# Patient Record
Sex: Female | Born: 1945 | Race: Black or African American | Hispanic: No | Smoking: Former smoker
Health system: Southern US, Community
[De-identification: ages and names within clinical notes are randomized; demographics above are authoritative.]

## PROBLEM LIST (undated history)

## (undated) DIAGNOSIS — I639 Cerebral infarction, unspecified: Secondary | ICD-10-CM

## (undated) DIAGNOSIS — R011 Cardiac murmur, unspecified: Secondary | ICD-10-CM

## (undated) DIAGNOSIS — I050 Rheumatic mitral stenosis: Secondary | ICD-10-CM

## (undated) DIAGNOSIS — R32 Unspecified urinary incontinence: Secondary | ICD-10-CM

## (undated) DIAGNOSIS — I10 Essential (primary) hypertension: Secondary | ICD-10-CM

## (undated) DIAGNOSIS — D649 Anemia, unspecified: Secondary | ICD-10-CM

## (undated) DIAGNOSIS — R55 Syncope and collapse: Secondary | ICD-10-CM

## (undated) DIAGNOSIS — I350 Nonrheumatic aortic (valve) stenosis: Secondary | ICD-10-CM

## (undated) DIAGNOSIS — M1990 Unspecified osteoarthritis, unspecified site: Secondary | ICD-10-CM

## (undated) HISTORY — PX: TOTAL KNEE ARTHROPLASTY: SHX125

## (undated) HISTORY — PX: TUBAL LIGATION: SHX77

## (undated) HISTORY — DX: Syncope and collapse: R55

## (undated) HISTORY — PX: CATARACT EXTRACTION, BILATERAL: SHX1313

## (undated) HISTORY — PX: CHOLECYSTECTOMY: SHX55

## (undated) HISTORY — PX: TONSILLECTOMY: SUR1361

---

## 1998-06-08 ENCOUNTER — Ambulatory Visit (HOSPITAL_COMMUNITY): Admission: RE | Admit: 1998-06-08 | Discharge: 1998-06-08 | Payer: Self-pay | Admitting: Family Medicine

## 1998-06-16 ENCOUNTER — Ambulatory Visit (HOSPITAL_COMMUNITY): Admission: RE | Admit: 1998-06-16 | Discharge: 1998-06-16 | Payer: Self-pay | Admitting: Family Medicine

## 1998-11-24 ENCOUNTER — Ambulatory Visit (HOSPITAL_COMMUNITY): Admission: RE | Admit: 1998-11-24 | Discharge: 1998-11-24 | Payer: Self-pay | Admitting: *Deleted

## 1998-11-24 ENCOUNTER — Encounter: Payer: Self-pay | Admitting: Family Medicine

## 1998-12-27 ENCOUNTER — Other Ambulatory Visit: Admission: RE | Admit: 1998-12-27 | Discharge: 1998-12-27 | Payer: Self-pay | Admitting: Obstetrics and Gynecology

## 1998-12-31 ENCOUNTER — Other Ambulatory Visit: Admission: RE | Admit: 1998-12-31 | Discharge: 1998-12-31 | Payer: Self-pay | Admitting: Obstetrics and Gynecology

## 1999-08-09 ENCOUNTER — Encounter: Payer: Self-pay | Admitting: Obstetrics and Gynecology

## 1999-08-09 ENCOUNTER — Ambulatory Visit (HOSPITAL_COMMUNITY): Admission: RE | Admit: 1999-08-09 | Discharge: 1999-08-09 | Payer: Self-pay | Admitting: Obstetrics and Gynecology

## 2000-01-09 ENCOUNTER — Other Ambulatory Visit: Admission: RE | Admit: 2000-01-09 | Discharge: 2000-01-09 | Payer: Self-pay | Admitting: Obstetrics and Gynecology

## 2000-08-27 ENCOUNTER — Encounter: Payer: Self-pay | Admitting: Otolaryngology

## 2000-08-27 ENCOUNTER — Ambulatory Visit (HOSPITAL_COMMUNITY): Admission: RE | Admit: 2000-08-27 | Discharge: 2000-08-27 | Payer: Self-pay | Admitting: Otolaryngology

## 2000-09-25 ENCOUNTER — Ambulatory Visit (HOSPITAL_COMMUNITY): Admission: RE | Admit: 2000-09-25 | Discharge: 2000-09-25 | Payer: Self-pay | Admitting: Family Medicine

## 2000-09-25 ENCOUNTER — Encounter: Payer: Self-pay | Admitting: Family Medicine

## 2000-09-28 ENCOUNTER — Encounter: Payer: Self-pay | Admitting: Obstetrics and Gynecology

## 2000-09-28 ENCOUNTER — Encounter: Admission: RE | Admit: 2000-09-28 | Discharge: 2000-09-28 | Payer: Self-pay | Admitting: Family Medicine

## 2001-01-14 ENCOUNTER — Other Ambulatory Visit: Admission: RE | Admit: 2001-01-14 | Discharge: 2001-01-14 | Payer: Self-pay | Admitting: Obstetrics and Gynecology

## 2001-01-28 ENCOUNTER — Ambulatory Visit (HOSPITAL_COMMUNITY): Admission: RE | Admit: 2001-01-28 | Discharge: 2001-01-28 | Payer: Self-pay | Admitting: Gastroenterology

## 2001-01-28 ENCOUNTER — Encounter: Payer: Self-pay | Admitting: Gastroenterology

## 2001-10-15 ENCOUNTER — Ambulatory Visit (HOSPITAL_COMMUNITY): Admission: RE | Admit: 2001-10-15 | Discharge: 2001-10-15 | Payer: Self-pay | Admitting: Gastroenterology

## 2001-10-15 ENCOUNTER — Encounter (INDEPENDENT_AMBULATORY_CARE_PROVIDER_SITE_OTHER): Payer: Self-pay | Admitting: Specialist

## 2003-10-30 ENCOUNTER — Inpatient Hospital Stay (HOSPITAL_COMMUNITY): Admission: EM | Admit: 2003-10-30 | Discharge: 2003-11-02 | Payer: Self-pay | Admitting: Emergency Medicine

## 2003-11-09 ENCOUNTER — Encounter: Admission: RE | Admit: 2003-11-09 | Discharge: 2004-02-07 | Payer: Self-pay | Admitting: Neurology

## 2004-11-16 ENCOUNTER — Ambulatory Visit: Payer: Self-pay | Admitting: Physical Medicine & Rehabilitation

## 2004-11-16 ENCOUNTER — Inpatient Hospital Stay (HOSPITAL_COMMUNITY): Admission: RE | Admit: 2004-11-16 | Discharge: 2004-11-21 | Payer: Self-pay | Admitting: Orthopedic Surgery

## 2006-12-04 ENCOUNTER — Ambulatory Visit: Payer: Self-pay | Admitting: Cardiology

## 2006-12-04 ENCOUNTER — Inpatient Hospital Stay (HOSPITAL_COMMUNITY): Admission: EM | Admit: 2006-12-04 | Discharge: 2006-12-07 | Payer: Self-pay | Admitting: Emergency Medicine

## 2006-12-05 ENCOUNTER — Encounter: Payer: Self-pay | Admitting: Cardiology

## 2006-12-05 ENCOUNTER — Encounter: Payer: Self-pay | Admitting: Vascular Surgery

## 2006-12-05 ENCOUNTER — Ambulatory Visit: Payer: Self-pay | Admitting: Vascular Surgery

## 2007-02-07 ENCOUNTER — Encounter: Admission: RE | Admit: 2007-02-07 | Discharge: 2007-03-28 | Payer: Self-pay | Admitting: Neurology

## 2007-06-11 ENCOUNTER — Ambulatory Visit (HOSPITAL_COMMUNITY): Admission: RE | Admit: 2007-06-11 | Discharge: 2007-06-11 | Payer: Self-pay | Admitting: Gastroenterology

## 2008-03-10 ENCOUNTER — Encounter: Admission: RE | Admit: 2008-03-10 | Discharge: 2008-03-10 | Payer: Self-pay | Admitting: Internal Medicine

## 2008-06-12 ENCOUNTER — Encounter: Admission: RE | Admit: 2008-06-12 | Discharge: 2008-06-12 | Payer: Self-pay | Admitting: Gastroenterology

## 2008-11-24 ENCOUNTER — Encounter: Admission: RE | Admit: 2008-11-24 | Discharge: 2008-11-24 | Payer: Self-pay | Admitting: Gastroenterology

## 2009-12-27 ENCOUNTER — Encounter: Admission: RE | Admit: 2009-12-27 | Discharge: 2009-12-27 | Payer: Self-pay | Admitting: Gastroenterology

## 2010-10-09 ENCOUNTER — Encounter: Payer: Self-pay | Admitting: Gastroenterology

## 2010-10-10 ENCOUNTER — Encounter: Payer: Self-pay | Admitting: Gastroenterology

## 2010-11-10 ENCOUNTER — Other Ambulatory Visit: Payer: Self-pay | Admitting: Gastroenterology

## 2010-11-22 ENCOUNTER — Other Ambulatory Visit (HOSPITAL_COMMUNITY): Payer: Self-pay | Admitting: Gastroenterology

## 2010-11-22 DIAGNOSIS — C220 Liver cell carcinoma: Secondary | ICD-10-CM

## 2010-11-25 ENCOUNTER — Other Ambulatory Visit (HOSPITAL_COMMUNITY): Payer: Self-pay

## 2010-12-09 ENCOUNTER — Ambulatory Visit (HOSPITAL_COMMUNITY)
Admission: RE | Admit: 2010-12-09 | Discharge: 2010-12-09 | Disposition: A | Discharge status: Home / Self Care | Payer: Self-pay | Source: Ambulatory Visit | Attending: Gastroenterology | Admitting: Gastroenterology

## 2010-12-09 DIAGNOSIS — I700 Atherosclerosis of aorta: Secondary | ICD-10-CM | POA: Insufficient documentation

## 2010-12-09 DIAGNOSIS — R935 Abnormal findings on diagnostic imaging of other abdominal regions, including retroperitoneum: Secondary | ICD-10-CM | POA: Insufficient documentation

## 2010-12-09 DIAGNOSIS — C220 Liver cell carcinoma: Secondary | ICD-10-CM

## 2011-02-03 NOTE — Op Note (Signed)
NAMESHAVAWN, STOBAUGH              ACCOUNT NO.:  [id number]   MEDICAL RECORD NO.:  [id number]          PATIENT TYPE:  INP   LOCATION:  X003                         FACILITY:  [facility]   PHYSICIAN:  John L. Rendall, M.D.  DATE OF BIRTH:  24-Oct-1945   DATE OF PROCEDURE:  11/16/2004  DATE OF DISCHARGE:                                 OPERATIVE REPORT   PREOPERATIVE DIAGNOSIS:  Osteoarthritis, right knee.   POSTOPERATIVE DIAGNOSIS:  Osteoarthritis, right knee.   SURGICAL PROCEDURE:  Right LCS total knee replacement with computer  navigation assistance.   SURGEON:  John L. Priscille Kluver, M.D.   ASSISTANT:  Jamelle Rushing, P.A.   ANESTHESIA:  General.   PATHOLOGY:  Bone on bone medial compartment of the knee and several years of  unremitting pain, worse with activity.   DESCRIPTION OF PROCEDURE:  Under general anesthesia, the right leg was  prepared with DuraPrep and draped as a sterile field.  It was wrapped out  with an Esmarch and a sterile tourniquet is elevated at 350 mm.  A midline  incision is made.  The patella is everted.  The femur is sized as a  standard.  Using the Shanz pins, two pins were placed in the tibia, two in  the distal femur after appropriate debridement and navigation arrays were  installed.  At this point, the proximal femoral head was localized by motion  of the arrays and the medial and lateral malleolus were found.  Computer  mapping of the proximal tibia and of the distal femur were then done, both  within 0.3 mm accuracy.  Once these were completed, the navigation array was  used for resection of the proximal tibia.  Once this was completed, the knee  was drawn into extension and the leg had been in 7 degrees varus.  It was  still in 5 degrees of varus.  Significant releases around the medial side of  the tibia were required to get it within 1-1/2 degrees of anatomic access.  I was relieved to within 1-1/2 degrees.  The flexion gaps were then  measured.  At  this point, the decision was made for a standard femoral  component and an approximate 12.5 tibial bearing.  Femoral cuts were then  made, anterior and posterior flare of the distal femur.  They were checked  with the navigation arrays.  The distal femoral cut was then made and that  was within 1-1/2 degrees of anatomic access, being in slight valgus where  the tibia was in slight varus 1-1/2 degrees.  The posterior recesses were  then debrided of meniscal remnants and cruciates.  The recessing guide was  then used.  The proximal tibia was then exposed, size was #3.  Center peg  hole with keel was inserted.  A #12-1/2 bearing was inserted and a standard  femur.  These revealed excellent bone fits on the femur and tibia but the  bearing was slightly lax and a 15 bearing gave a better fix.  The patella  was then osteotomized.  Three peg holes were placed and a trial patellar  bearing  was inserted.  With the trial components in place, the knee was  within 1-1/2 degrees of anatomical axis and this was felt very satisfactory.  The knee flexed easily to about 120 degrees and had full extension.  At this  point, permanent components were obtained.  Shanz pins were removed.  The  patella and other bony surfaces were prepared with pulse irrigation.  Permanent components were then cemented in place.  While cement hardened, a  synovectomy was carried out.  Medium Hemovac was  inserted.  Once the cement was hard, excess was removed and the tourniquet  was let down at 1 hour 5 minutes.  The knee was then closed in layers with  #1 Ticron, 0 and 2-0 Vicryl, and skin clips.  The patient returned to  recovery in excellent condition.      JLR/MEDQ  D:  11/16/2004  T:  11/16/2004  Job:  [id number]

## 2011-02-03 NOTE — H&P (Signed)
Christine Christensen, PONG NO.:  [id number]   MEDICAL RECORD NO.:  [id number]          PATIENT TYPE:  EMS   LOCATION:  MAJO                         FACILITY:  [facility]   PHYSICIAN:  Isidor Holts, M.D.  DATE OF BIRTH:  1946-08-22   DATE OF ADMISSION:  12/04/2006  DATE OF DISCHARGE:                              HISTORY & PHYSICAL   PMD: Dr. Andi Devon.  Neurologist: Dr. Anne Hahn   CHIEF COMPLAINT:  Increased right leg weakness, also numbness left arm  for 2 days. Slurred speech for 1 day.   HISTORY OF PRESENT ILLNESS:  This is a 65 year old female. For past  medical history, see below.  The patient is an excellent historian and  was able to supply history, herself.  According to her, she was in her  usual state of health until a.m. of December 03, 2006, when she got out of  bed.  She usually ambulates with a cane and as she tried to make her way  to the bathroom, noticed that her right leg seemed to be dragging and  somewhat floppy and she had to lift her right leg quite high, in order  to ambulate.  She also experienced some numbness of the left arm.  She  denies associated dizziness, denies blurring of vision, chest pain or  shortness of breath.  These symptoms persisted all day, and when she  woke up in a.m. of the next day, she noticed her speech was slurred .  Denies any dysphagia.  She became concerned and called a friend of hers,  who was a Network engineer.  That friend sent her daughter over. She eventually  called the emergency medical services, and was brought to the Emergency  Department.   MEDICAL HISTORY:  1. 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  2. History of hepatitis B.  3. Osteoarthritis, status post right total knee arthroplasty November 16, 2004.  4. Anxiety.  5. Status post tonsillectomy 1982.  6. Status post cholecystectomy.   MEDICATIONS:  Aspirin 325 mg p.o. daily.   ALLERGIES:  NO KNOWN DRUG ALLERGIES.   REVIEW OF SYSTEMS:  As per  HPI and chief complaint.  The patient denies  chest pain.  Denies shortness of breath.  Denies abdominal pain,  vomiting or diarrhea.  Denies pyrexia.   SOCIAL HISTORY:  The patient is a divorcee, she has 2 sons, both of whom  are alive and well.  The patient is a smoker, has smoked from age 66  years and smokes approximately 1/2  pack of cigarettes per day.  She  states that she used to drink at least a glass of wine daily, up until  her  CVA in early 2005.  Since then she quit for a very long time, but  fairly recently has started to drink an occasional wine.  As a matter of  fact, had a glass of wine yesterday evening.  The patient has no history  of drug abuse.  Both parents are living . Her mother is aged 22 years.  She is hypertensive has a history of  coronary artery disease, status  post CABG.  Her father is aged 45 years.  He has a history of  hypertension, but is otherwise well.   PHYSICAL EXAMINATION:  VITALS:  Temperature 98.5, pulse 77 per minute  regular, respiratory rate 16, BP 150/85 mmHg, pulse oximeter 99% on room  air.  GENERAL:  The patient does not appear to be in obvious acute distress.  Alert, communicative, not short of breath at rest.  Speech is somewhat  slow, but it is quite distinct  HEENT: No clinical pallor or jaundice.  No conjunctival injection.  NECK:  Supple.  JVP not seen.  No palpable lymphadenopathy.  No palpable  goiter.  CHEST:  Clinically clear to auscultation, no wheezes or crackles.  HEART:  Normal regular, no murmurs.  ABDOMEN:  Full, soft and nontender.  There is no palpable organomegaly.  No palpable masses.  Normal bowel sounds.  EXTREMITIES:  No pitting edema.  Palpable peripheral pulses.  CENTRAL NERVOUS SYSTEM:  As mentioned above.  Speech is slow but  distinct, slightly slurred.  The patient however appears to have no  facial asymmetry or other cranial nerve deficit.  Right upper and lower  extremities are weak, with power  approximately 4-/5 right arm, and about  3/5 right leg.  Left upper and lower extremities have full power, tone  and coordination.  Both plantar responses are equivocal.  MUSCULOSKELETAL:  Quite unremarkable, although surgical scar is noted  over the left knee.  The patient appears to have osteoarthritic changes.   INVESTIGATIONS:  CBC:  WBC 7.1, hemoglobin 14.3, hematocrit 41.2,  platelets 265.  Electrolytes sodium 137, potassium 3.6, chloride 103,  CO2 26, BUN 7, creatinine 0.73, glucose 99, INR 1.  EKG dated December 04, 2006 shows sinus rhythm regular 68 per minute, normal axis, no acute  ischemic changes.  Head CT scan dated December 04, 2006 shows bilateral age  indeterminate left lacunar infarcts   ASSESSMENT AND PLAN:  1. Cerebrovascular accident versus transient ischemic attack.  We      shall continue workup with brain MRI/MRA also do bilateral carotid/      vertebral artery duplex scans, 2-D echocardiogram, TSH, lipid      profile, homocysteine levels.  Meanwhile will perform regular neuro      checks and commence the patient on Aggrenox.   1. Hypertension.  Blood pressure is reasonable, against the background      of possible new cerebrovascular accident .  We will monitor, and      address only if his BP is equal to or greater than 180.   1. Smoking history.  We shall counsel appropriately, and utilize      Nicoderm CQ patch.   1. Anxiety.  We shall utilize p.r.n. Xanax.   Note we shall invite Dr. Anne Hahn and his team to participate in the  patient's care, as she is  well-known to them.   Further management will depend on clinical course.      Isidor Holts, M.D.  Electronically Signed     CO/MEDQ  D:  12/04/2006  T:  12/05/2006  Job:  [id number]   cc:   Merlene Laughter. Renae Gloss, M.D.  Marlan Palau, M.D.

## 2011-02-03 NOTE — Discharge Summary (Signed)
Christine Christensen, Christine Christensen              ACCOUNT NO.:  [id number]   MEDICAL RECORD NO.:  [id number]          PATIENT TYPE:  INP   LOCATION:  0477                         [facility]ACILITY:  [facility]   PHYSICIAN:  John L. Rendall, M.D.  DATE OF BIRTH:  1945-11-18   DATE OF ADMISSION:  11/16/2004  DATE OF DISCHARGE:  11/21/2004                                 DISCHARGE SUMMARY   ADMISSION DIAGNOSES:  1.  End-stage osteoarthritis right knee.  2.  History of cerebrovascular accident.  3.  History of hepatitis B.   DISCHARGE DIAGNOSES:  1.  Right total knee arthroplasty.  2.  History of cerebrovascular accident.  3.  History of hepatitis.   HISTORY OF PRESENT ILLNESS:  The patient is a 65 year old black female with  significant progressively worsening right knee pain since May of earlier  this year. Pain is with weightbearing and range of motion. She describes it  as a constant achy pain. It does increase with activity and weather changes.  She does have mechanical symptoms. She does note swelling and night pain. X-  rays reveal medial joint bone-on-bone with increased porotic tibia and large  osteophytes throughout.   ALLERGIES:  No known drug allergies.   CURRENT MEDICATIONS:  1.  Aspirin 325 mg p.o. daily.  2.  Lexapro 20 mg p.o. daily.  3.  Baraclude 1 mg p.o. daily.   SURGICAL PROCEDURE:  On November 16, 2004 the patient was taken to the OR by Dr.  Erasmo Leventhal, M.D., assisted by Jamelle Rushing P.A.-C. Under general  anesthesia with a femoral nerve block, the patient underwent a right total  knee arthroplasty with the following components:  A size standard right  femoral component, a size standard 3 keeled tibial tray, 15 mm standard  polyethylene bearing, standard patella component, and all components were  implanted with polymethylmethacrylate, and the patient was transferred to  the recovery room and then to the orthopedic floor for routine total knee  protocol.   CONSULTS:  The following  routine consults were requested:  Physical therapy,  occupational therapy, case management.   HOSPITAL COURSE:  On November 16, 2004 the patient was admitted to [facility] under the care of Dr. Jonny Ruiz Rendall. The patient was taken to the OR  where right total knee arthroplasty with computer navigation was performed  without any complications. The patient was transferred to the recovery room  and then to the orthopedic floor in good condition with IV pain medicines,  antibiotics, and her leg in a CPM, and Arixtra for DVT prophylaxis.   The patient then incurred a total of 5 days postoperative care on the  orthopedic floor in which the patient did very well. She initially had some  significant calf pain but she had a lot of posterior capsule stripping and  this did improve over several days. The patient had borderline postoperative  blood loss anemia but her vital signs remained stable, her heart rate  remained within normal limits, and the patient was able to work well with  physical therapy so no transfusion was required. Her wound remained  benign  for signs of infection. Her leg remained neuromotor vascularly intact and  the patient has no other untoward medical or orthopedic events occur so she  was discharged home in good condition with outpatient home health physical  therapy.   LABORATORY DATA:  CBC on March 4:  Wbc's 11.6, hemoglobin 10.2, hematocrit  30.0, platelets 206. Routine chemistries on March 3:  Sodium 136, potassium  3.5, glucose 116 - felt to be routine postoperative stress elevation, BUN 4,  creatinine 0.9.   EKG on admission was normal sinus rhythm, left atrial enlargement,  questionable septal infarct age undetermined at the rate of 74 beats per  minute.   MEDICATIONS UPON DISCHARGE FROM ORTHOPEDICS FLOOR:  1.  Colace 100 mg p.o. b.i.d.  2.  Trinsicon one tablet p.o. t.i.d.  3.  Percocet one or two tablets q.4-6h. for pain p.r.n.  4.  Tylenol 650 mg p.o.  q.4h. p.r.n.  5.  Robaxin 500 mg p.o. q.6h. p.r.n.  6.  Arixtra 2.5 mg subcu daily.  7.  Lexapro 20 mg p.o. daily.  8.  Laxative or enema of choice.   DISCHARGE INSTRUCTIONS:  1.  The patient is to resume routine home medications except for the patient      is to hold aspirin while on Arixtra.  2.  Percocet 5 mg one or two tablets q.4-6h. for pain if needed.  3.  Robaxin 500 mg one tablet q.6h. for muscle spasms if needed.  4.  Arixtra 2.5 mg injection once a day until gone, and then the patient is      to resume one aspirin a day.  5.  Activity as tolerated with the use of a walker. Home health physical      therapy by Genevieve Norlander.  6.  Diet: No restrictions.  7.  Wound care:  The patient may keep wound clean and dry and keep it      covered. If any concerns of infection the patient is to call Dr.      Sable Feil office for advice.  8.  Follow-up:  The patient needs a follow up appointment with Dr. Priscille Kluver      in the office in 9 days from discharge. The patient is to call office      for advice.   CONDITION UPON DISCHARGE TO HOME:  Listed improved and good.      RWK/MEDQ  D:  12/05/2004  T:  12/05/2004  Job:  [id number]

## 2011-02-03 NOTE — H&P (Signed)
NAMESHELBI, VACCARO              ACCOUNT NO.:  [id number]   MEDICAL RECORD NO.:  [id number]          PATIENT TYPE:  INP   LOCATION:  NA                           FACILITY:  [facility]   PHYSICIAN:  John L. Rendall, M.D.  DATE OF BIRTH:  07-11-1946   DATE OF ADMISSION:  11/16/2004  DATE OF DISCHARGE:                                HISTORY & PHYSICAL   CHIEF COMPLAINT:  Right knee pain.   HISTORY OF PRESENT ILLNESS:  The patient is a 65 year old black female with  many years history of progressively worsening right knee pain.  The patient  describes the pain as a deep, intense, constant pain deep within the knee,  no radiation.  She does have situations where the knee gives out at times  while she is up ambulating.  The pain will worsen with the amount of  activity she does and with weather changes.  She does have mechanical  popping, grinding and clunking within the knee.  She does have some slight  swelling.  She does have pain at night.   X-rays reveal near bone on bone medial joint with increased sclerotic tibial  plateau with large osteophytes.   ALLERGIES:  No known drug allergies.   CURRENT MEDICATIONS:  1.  Aspirin 325 mg p.o. daily.  2.  Lexapro 20 mg p.o. daily.  3.  Baraclude 1 mg p.o. daily.   PAST MEDICAL HISTORY:  1.  History of an occlusive cerebrovascular accident affecting her right      side in 2005.  2.  The patient has still some slight residual weakness, the right      extremities.  3.  Hepatitis B.   PAST SURGICAL HISTORY:  1.  Tonsillectomy in 1982.  2.  Cholecystectomy.  3.  The patient denies any complications with anesthesia with the above-      mentioned surgical procedures.   SOCIAL HISTORY:  The patient is a healthy-appearing, fairly physically fit,  65 year old black female ambulating with a cane in her right hand.  She does  smoke about 10 cigarettes a day for the last 43 years.  She denies any  alcohol use.  She is divorced.  She has got two  grown sons.  She lives in a  one story house and she is currently employed and working as an Solicitor.   FAMILY PHYSICIAN:  Dr. Andi Devon.   NEUROLOGIST:  Dr. Pearlean Brownie.   FAMILY MEDICAL HISTORY:  Mother is alive with a history of coronary artery  disease and a coronary artery bypass graft.  Father is alive with a history  of ulcer disease.  The patient has got two brothers alive, one with a  history of renal disease and the other with obesity.  Four sisters alive  with a combination of a history of hypertension and cardiac disease.   REVIEW OF SYSTEMS:  Positive for the patient had the flu about one month ago  with fevers and chills.  She denies any residual problems.  She does have a  partial lower denture.  She does wear glasses for reading.  She does  occasionally wear a hearing aid in her left ear but it just intensifies the  ringing sensation in her ears.  The patient also has some residual weakness  in her right upper extremity due to her cerebrovascular accident one year  previously but she has been reportedly released by Dr. Pearlean Brownie.  Otherwise,  all other review of systems categories are negative and noncontributory if  not mentioned above.   PHYSICAL EXAMINATION:  VITAL SIGNS:  Height is 5 feet 4 inches, weight is  183 pounds.  Blood pressure is 138/82, pulse is 76 and regular, respirations  are 12.  The patient is afebrile.  GENERAL:  This is a fairly healthy-appearing black female, ambulates with a  cane in the right side with very slow deliberate steps.  She easily gets on  and off the exam table with just a little assistance.  HEENT:  Head was normocephalic.  Pupils equal, round, and reactive.  Extraocular movements intact.  Sclerae was not icteric.  External ears were  without deformities.  Gross hearing is intact.  Oral buccal mucosa was pink  and moist.  NECK:  Supple.  No palpable lymphadenopathy.  Thyroid region was nontender.  She had excellent range of motion  of her cervical spine without any  difficulty or tenderness.  CHEST:  Lung sounds were clear and equal bilaterally.  No wheezes, rales or  rhonchi.  HEART:  A regular rate and rhythm.  No murmurs, rubs or gallops.  ABDOMEN:  Round, soft, nontender.  Bowel sounds normoactive.  She had no CVA  region tenderness.  EXTREMITIES:  Upper extremities were symmetrical in size and shape.  She had  full range of motion of her shoulders, elbows and wrist.  Motor strength was  5/5 on the left and about a 4/5 on the right.  She had no numbness or  tingling in the upper extremities.  Lower extremities:  Right and left hip  had full extension, flexion up to 115 degrees, limited mostly by abdominal  mass.  She had 20 degrees internal and external rotation without any  difficulty.  The right knee was non swollen.  She had no erythema or  ecchymosis.  She had no effusion.  She was slightly tender along the very  medial aspect of the knee.  No pain laterally.  She had about 5 degrees  short of full extension, flexion back to 100 degrees with a few degrees of  valgus and varus laxity.  The calf is nontender.  The left knee had full  extension, flexion back to 115 degrees.  She had slight medial to lateral  laxity, no joint line tenderness.  No effusion.  The calf was nontender.  The ankles were symmetrical with good dorsoplantar flexion.  PERIPHERAL VASCULAR:  Carotid pulses were 2+, no bruits.  Radial pulses were  2+.  Posterior tibial on the left was 1+, dorsalis pedis on the right was  1+.  She had no lower extremity edema.  NEUROLOGICAL:  The patient was conscious, alert and appropriate.  Ease of  conversation with examiner.  She had no gross neurologic defects noted other  than the slight weakness in the right upper extremity.  BREASTS/RECTAL/GENITOURINARY:  Exams were deferred at this time.   IMPRESSION: 1.  End-stage osteoarthritis, right knee.  2.  History of cerebrovascular accident in 2005 with  slight residual right-      sided weakness.  3.  History of hepatitis B.   The patient is expected to have  a right total knee arthroplasty by Dr.  Priscille Kluver at [facility] on November 16, 2004.  We have not received any  information from Dr. Pearlean Brownie or Dr. Renae Gloss for this patient's clearance.  We  have contacted their office and requested this information prior to  proceeding with her total knee arthroplasty.  Otherwise, the patient will  undergo all other routine laboratories and tests prior to having this  procedure done by Dr. Priscille Kluver.      RWK/MEDQ  D:  11/08/2004  T:  11/08/2004  Job:  [id number]

## 2011-02-03 NOTE — Discharge Summary (Signed)
NAMETIARIA, BIBY              ACCOUNT NO.:  [id number]   MEDICAL RECORD NO.:  [id number]          PATIENT TYPE:  INP   LOCATION:  4705                         FACILITY:  [facility]   PHYSICIAN:  Mobolaji B. Bakare, M.D.DATE OF BIRTH:  Jan 11, 1946   DATE OF ADMISSION:  12/04/2006  DATE OF DISCHARGE:  12/07/2006                               DISCHARGE SUMMARY   PRIMARY CARE PHYSICIAN:  Dr. Andi Devon.   NEUROLOGIST:  Dr. Anne Hahn.   FINAL DIAGNOSES:  1. New pontine infarct.  2. 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  3. Hypertension.  4. Tobacco abuse.  5. Anxiety.   BRIEF HISTORY:  Ms. Frandsen is a 65 year old African American female with  previous CVA in February 2005 involving the left brain pontine  medullary.  She has been on aspirin ever since and she claims  compliance.  She presented to the emergency room with 2-day history of  increased right-sided weakness and numbness.  Her speech was slurred for  1 day.  Speech appeared to have improved at the time of presentation.  On physical examination she indeed had weakness on the right upper and  lower extremities, but difficult to objectively see that there is  worsening of the hemiparesis.  Her blood pressure was 150/85.  Patient  continues to smoke cigarettes.   HOSPITAL COURSE:  New pontine infarct.  Patient had a CT scan in the  emergency room which showed no acute abnormality.  She was subsequently  admitted for further evaluation with a MRI.  MRI of the brain did show  small to moderate size acute nonhemorrhagic left pontine infarct which  is located superior to the old infarct involving the left ponto-  medullary junction.  Upon admission patient was switched from aspirin to  Aggrenox.  Neurologic consult was obtained.  Dr. Thad Ranger provided  consult and recommended MRA of the neck to evaluate for obstruction.  MRA showed left vertebral artery smaller than the right, but there was  no focal  area of narrowing.   2D echocardiogram showed no thrombus.  Carotid Dopplers were normal.  Homocysteine level was normal.  Fasting lipid profile was okay.   The patient was seen by physical and occupational therapists.  She will  continue with PT and OT at home.  She was also seen by speech therapist.  Swallowing evaluation was normal.  She will be followed up by speech  therapist for language evaluation via home health.   Patient was counseled regarding tobacco cessation and was started on  nicotine patch.   PROCEDURES:  1. Head CT scan done on December 04, 2006, showed subacute to chronic      bilateral lacunar infarct within the basal ganglia.  No evidence of      acute critical stroke.  No intracranial hemorrhage.  MRI of the      brain showed small to moderate sized nonhemorrhagic left pontine      infarct.  Progressive white-matter changes __________ probably      related to sequelae of small-vessel disease.  Multiple small      infarcts basal ganglia  corona radiata.  MRA of the head showed      atherosclerotic-type changes which are mild to moderate.  MRA of      the neck has shown left vertebral artery smaller than right, but no      focal area of narrowing.  2. 2D echocardiogram showed normal left ventricular systolic function      with ejection fraction of 60% to 65%.  No cardiac source of emboli      noted.  3. Carotid Dopplers showed no significant stenosis.   DISCHARGE MEDICATIONS:  1. Aggrenox 1 at bedtime for 3 nights then increase to b.i.d.  2. Nicotine patch 40 mg daily.   Follow up with Dr. Anne Hahn in 2 months, Dr. Kellie Shropshire in 2 weeks.   Home Health PT, OT and speech will follow up with patient.      Mobolaji B. Corky Downs, M.D.  Electronically Signed     MBB/MEDQ  D:  12/07/2006  T:  12/08/2006  Job:  [id number]   cc:   Marlan Palau, M.D.  Merlene Laughter. Renae Gloss, M.D.

## 2011-02-03 NOTE — Discharge Summary (Signed)
NAME:  Christine Christensen, Christine Christensen                        ACCOUNT NO.:  [id number]   MEDICAL RECORD NO.:  [id number]                   PATIENT TYPE:  INP   LOCATION:  3019                                 FACILITY:  [facility]   PHYSICIAN:  Pramod P. Pearlean Brownie, MD                 DATE OF BIRTH:  Nov 09, 1945   DATE OF ADMISSION:  10/29/2003  DATE OF DISCHARGE:  11/02/2003                                 DISCHARGE SUMMARY   ADMISSION DIAGNOSIS:  Right hemiparesis.   DISCHARGE DIAGNOSES:  1. 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  2. Depression.  3. Hepatitis B, chronic.   SUBJECTIVE:  The patient is a 65 year old lady who was admitted for sudden  onset of right-sided weakness five days prior to admission.  Kindly see Dr.  Malena Edman excellent H&P for details.  The patient had some fluctuating  symptoms five days prior to admission.  She was seen by her primary  physician, but was not hospitalized.  She had been started on an aspirin as  an outpatient following the present symptoms.  On admission, she was  evaluated in the emergency room.  Her vital signs were stable.  She was  found to have mild right facial weakness, as well as significant right  hemiparesis, 3/5, with severe weakness of the intrinsic hand muscles on the  right.  A non-contrast CT scan of the head showed low density in the left  pons, possibly with a history of an acute infarction.  EKG showed normal  sinus rhythm.  Admission laboratories were unremarkable.  She was admitted  to the stroke service for risk stratification work-up.  She subsequently  underwent an MRI scan of the brain, which confirmed an acute infarction  involving the left caudal pons and upper medulla.  MRA of the brain did not  reveal significant vascular stenosis.  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  The patient subsequently underwent  catheter cerebral angiogram to evaluate this further on October 30, 2003,  which showed hypoplastic left vertebral artery without significant stenosis  at the origin.  There were mild atherosclerotic changes involving the  cavernous carotids bilaterally.  There was an origin of left ophthalmic  artery from the proximal portion of the cavernous segment of the left  internal carotid artery, which was a normal development variation.  Echocardiogram was pending at the time of discharge.  The patient had no  significant risk factors, except for smoking, which she was counseled, and  she agreed to quit.  She had laboratories drawn for hypercoagulable panel,  the results of which were pending at the time of discharge.  She was seen on  consultation by physical and occupational therapy, and was found to ambulate  safely and do well with outpatient physical therapy.  On the day of  discharge, she  had minimal right facial weakness, and had mild 4/5 right  hemiparesis.  She had weakness of the right grip and intrinsic hand muscles,  but good proximal strength.  She was able to walk safely independently with  dragging of the right foot.   DISCHARGE MEDICATIONS:  1. She was started on an aspirin for stroke prevention.  Advised to continue her home medications, which were -  1. Lexapro 20 mg daily.  2. Hepsera one tablet daily.   FOLLOW UP:  1. She was also advised to have outpatient physical and occupational therapy     at [facility].  2. She was asked not to drive, and was advised to follow up with her primary     physician.  The patient requested to change her primary physician from     Dr. Margrett Rud.  She was given the telephone number for Dr. Andi Devon, [phone], to call to make an appointment.  3. She was advised to quit smoking, and given the number to call for smoking     cessation classes, [phone].  4. She was asked not to work until she was seen in follow up by  Dr. Pearlean Brownie in     his office in 6 weeks.                                                Pramod P. Pearlean Brownie, MD    PPS/MEDQ  D:  11/02/2003  T:  11/02/2003  Job:  [id number]   cc:   Merlene Laughter. Renae Gloss, M.D.  [address]  [address]  [place]  [place] [address]  Fax: [phone]

## 2011-02-03 NOTE — H&P (Signed)
NAME:  KAMIKA, GOODLOE NO.:  [id number]   MEDICAL RECORD NO.:  [id number]                   PATIENT TYPE:  EMS   LOCATION:  MAJO                                 FACILITY:  [facility]   PHYSICIAN:  Marlan Palau, M.D.               DATE OF BIRTH:  1946-04-05   DATE OF ADMISSION:  10/29/2003  DATE OF DISCHARGE:                                HISTORY & PHYSICAL   HISTORY OF PRESENT ILLNESS:  Graciemae I. Bang is a 65 year old right-handed  black female born [birth year], with a history of cerebrovascular  disease in the past, had mini-strokes previously.  The patient has however  not been on antiplatelet agents.  The patient comes to [facility]  this evening for evaluation of a progressive right hemiparesis that began  about 5 days prior to this admission.  The patient noted onset of some mild  right-sided weakness associating with tingling sensations down the right  arm, blurred vision, diaphoresis, pain behind the ear, a twitching in the  muscles of the right arm.  The patient has had some problems with bifrontal  headaches, has had progressive worsening of the right leg and right arm  weakness as the days have passed, finding it increasingly difficult to walk.  The patient was seen by Dr. Karma Ganja 3 days ago and was sent out on  aspirin and set up for an outpatient work up.  The patient is admitted to  [facility] tonight for an evaluation of cerebrovascular infarction.   PAST MEDICAL HISTORY:  1. New onset of right hemiparesis as above.  2. Status post gallbladder resection in the past.  3. Tonsillectomy.  4. Bilateral tubal ligation.  5. History of hypercholesterolemia not on medication.   MEDICATIONS:  1. Lexapro 20 mg daily.  2. Aspirin 1 tablet a day.   ALLERGIES:  No known drug allergies.   HABITS:  Smokes a half-pack of cigarettes a day.  Drinks red wine on a  regular basis.  There is some indication in the past  medical records of  chronic hepatitis B.   SOCIAL HISTORY:  The patient is divorced, lives in the [place] area, has  2 sons.  One son with low back pains.  The patient has been working as an  Solicitor for [employer].   FAMILY HISTORY:  Notable for the fact that mother is alive and well.  Father  is alive and well.  The patient has a sister with mental retardation, has a  total of 10 siblings with history of  hypertension, heart disease in the  family.   REVIEW OF SYSTEMS:  Notable for some headache as above.  Blurred vision.  No  neck pain.  The patient denies shortness of breath, chest pain, palpitations  of the heart, and denies any problems controlling the bowels or the bladder.  Has had no falls or black outs.  PHYSICAL EXAMINATION:  VITAL SIGNS:  Blood pressure is 141/77, heart rate  66, respiratory rate 18, temperature afebrile.  GENERAL:  This patient is a moderately obese, black female who is alert and  cooperative at the time of examination.  HEENT:  Head is atraumatic.  Eyes - pupils are equal, round and react to  light.  Disks are flat bilaterally.  NECK:  Supple, no carotid bruits noted.  RESPIRATORY EXAM:  Clear.  CARDIOVASCULAR:  Regular rate and rhythm, no obvious murmurs, rubs noted.  EXTREMITIES:  Without significant edema.  NEUROLOGIC:  Cranial nerves II-XII intact. as above.  Facial symmetry is  present. The patient has good sensation of the face to pin prick and soft  touch bilaterally.  Has good strength to the facial muscles, on head  turning, shoulder shrug bilaterally.  Again, extraocular movements are full.  Visual fields are full.  ABDOMEN:  Obese, positive bowel sounds, no organomegaly or tenderness noted.  MOTOR TESTING:  Reveals distal weakness to the right arm, right leg, 3/5  with grip intrinsic muscles, dorsiflexion of the right foot.  The patient  has fairly good proximal strength with the right arm, right leg.  The  patient has mild drift with  the right arm, has symmetric pin prick sensation  on the upper extremities, decreased pin prick and vibratory sensation on the  right leg as compared to the left.  Deep tendon reflexes are relatively  symmetric, questionably upgoing toe on the right, neutral on the left.  The  patient was not ambulated.   LABORATORY DATA:  Laboratory values are pending at this time.  CT of the  head shows evidence of a pontine infarct on the left and left frontal white  matter infarct, not clear whether these are old or subacute.  EKG reveals  normal sinus rhythm, nonspecific T wave abnormalities, heart rate is 66.   IMPRESSION:  1. New onset of right hemiparesis.  2. Abnormal CT of the head as above.   The patient likely sustained a subcortical lacunar type infarct possibly  involving the left pons.  Will admit this patient for further evaluation at  this time.   PLAN:  1. Admission to [facility].  2. MRI of the brain.  3. MRI angiogram.  4. A 2D echocardiogram.  5. Intravenous fluid hydration.  6. Aspirin therapy for now.  7. Physical and occupational therapy evaluation.  May consider rehab     consult.   I will follow the patient's clinical course while inhouse.                                                Marlan Palau, M.D.   CKW/MEDQ  D:  10/30/2003  T:  10/30/2003  Job:  [id number]   cc:   Vale Haven. Andrey Campanile, M.D.  [address]  [place]  [place] [address]  Fax: [phone]

## 2011-02-03 NOTE — Consult Note (Signed)
Christine Christensen, LEVETT              ACCOUNT NO.:  [id number]   MEDICAL RECORD NO.:  [id number]          PATIENT TYPE:  INP   LOCATION:  4705                         FACILITY:  [facility]   PHYSICIAN:  Michael L. Reynolds, M.D.DATE OF BIRTH:  [birth year]   DATE OF CONSULTATION:  12/06/2006  DATE OF DISCHARGE:                                 CONSULTATION   REQUESTING PHYSICIAN:  Dr. Corky Downs.   REASON FOR EVALUATION:  Right hemiparesis, suspect stroke.   HISTORY OF PRESENT ILLNESS:  This is an inpatient consultation  evaluation of this existing [facility] patient, a 65-  year-old woman with a history of a previous stroke with resultant right  hemiparesis, who has been seen in our office by Dr. Anne Hahn in the past.  She reports that she was in her usual normal state until the morning of  December 03, 2006, when she noted increasing weakness and difficulty with  dragging her right leg when she got up to go to the bathroom.  She  also noticed some numbness in the left arm and a little bit of  weakness in the right arm with some increase in difficulty using the arm  to perform fine motor function, such as writing.  These symptoms  persisted the remainder of the day and the next morning she woke up and  noticed her speech was slurred.  She denied having any difficulty with  associated chewing and swallowing.  She was admitted to the hospital  where she was admitted for evaluation, including a routine stroke  workup.  Neurologic consultation was subsequently requested.  The  patient says that since she has been here in the hospital, her symptoms  have not changed all that much.  She does, however, feel that she is  definitely not quite where she was prior to the events of a few days  ago.   PAST MEDICAL HISTORY:  Remarkable for a previous left brain  pontomedullary stroke with resultant right hemiparesis, for which she  has been seen by our practice in the past.  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   FAMILY/SOCIAL/REVIEW OF SYSTEMS:  As outlined in the admission H&P by  Dr. Brien Few of December 04, 2006, which is reviewed, as well as per the  admission nursing record which is reviewed.   MEDICATIONS:  Prior to admission, she was taking only aspirin 325 mg  daily.  Here in the hospital she is also receiving subcutaneous Lovenox  and Protonix, and was also started on Aggrenox and a nicotine patch.   PHYSICAL EXAMINATION:  VITAL SIGNS:  Temperature 97.6, blood pressure  111/68, pulse 68, respirations 20.  O2 saturation 100% on room air.  GENERAL:  This is a healthy appearing woman seated in the chair in no  evident distress.  HEAD:  Cranium was normocephalic and atraumatic.  Oropharynx benign.  NECK:  Supple without carotid or supraclavicular bruits.  HEART:  Regular rate and rhythm without murmurs.  NEUROLOGICAL:  Mental status:  She is awake  and alert.  She is fully  oriented to time and place.  Recent and remote memory are intact.  She  is able to name objects and repeat phrases without difficulty.  Mood is  euthymic and affect appropriate.  Cranial nerves:  Pupils are equal and  reactive.  Extraocular movements are full without nystagmus.  Visual  fields full to confrontation.  Hearing is intact to conversational  speech.  There is slight right facial weakness.  Tongue and palate move  normally and symmetrically.  Shoulder shrug strength is slightly weak on  the right.  Motor:  Normal bulk and tone.  She has a mild right  hemiparesis, with approximately 4/5 strength in finger extension and  intrinsic muscles of the right hand, as well as 4+/5 strength in  dorsiflexion of the right ankle and flexion of the right hip.  Sensory:  Some diminished light touch sensation over the right lower extremity and  upper extremity compared to the left.  Coordination:  Rapid movements  were performed  adequately.  Finger-to-nose is performed a little bit  clumsily on the right, but she does fairly well with this.  Gait:  She  ambulates well in the hall with a walker.  Reflexes 2+ symmetric.  Toes  are downgoing bilaterally.   LABORATORY REVIEW:  MRI of the brain performed yesterday is personally  reviewed.  The study is remarkable for an acute left pontine infarct  lying just superior to the previous left pontomedullary infarct.  There  is also a mild degree of white matter disease focused mostly on the  paraventricular areas.  MRA of the intracranial circulation demonstrates  diffuse atherosclerotic disease without severe focal stenoses.  She had  a 2D echocardiogram done yesterday which demonstrates an EF of 60% to  65% and no evidence of an embolic source.  Carotid Dopplers demonstrated  good flow in the carotids bilaterally.  CBC on admission was normal.  Routine chemistries were normal.  She had an unremarkable lipid panel  and a normal homocysteine level, as well as normal TSH.   IMPRESSION:  1. Acute left pontine stroke with mild increase in previous baseline      right hemiparesis.  2. History of left brain pontomedullary stroke with right hemiparesis.  3. Tobacco abuse.   RECOMMENDATIONS:  Agree with changing aspirin to Aggrenox.  Will check  an MRA of the neck, mostly to evaluate the health of the vertebral  vessels, given that this is now her second brain stem stroke.  I also  urged her to stop smoking, which is probably the best thing she can do  for her health at this point.  She needs ongoing aggressive monitoring  and control of her risk factors, including blood pressure, blood sugar,  cholesterol, etc.  If her MRA is clean, then I do not object to her  being discharged today with home health, physical therapy, occupational  therapy, and eventual outpatient followup.  She needs to call the office to follow up with Dr. Anne Hahn in a couple of months.  Thank you for  the  consultation.      Michael L. Thad Ranger, M.D.  Electronically Signed     MLR/MEDQ  D:  12/06/2006  T:  12/06/2006  Job:  [id number]   cc:   Merlene Laughter. Renae Gloss, M.D.  Marlan Palau, M.D.

## 2011-06-20 DIAGNOSIS — M7989 Other specified soft tissue disorders: Secondary | ICD-10-CM

## 2011-06-20 DIAGNOSIS — M79609 Pain in unspecified limb: Secondary | ICD-10-CM

## 2011-12-26 ENCOUNTER — Other Ambulatory Visit: Payer: Self-pay | Admitting: Gastroenterology

## 2011-12-26 DIAGNOSIS — B181 Chronic viral hepatitis B without delta-agent: Secondary | ICD-10-CM

## 2012-01-02 ENCOUNTER — Ambulatory Visit
Admission: RE | Admit: 2012-01-02 | Discharge: 2012-01-02 | Disposition: A | Discharge status: Home / Self Care | Payer: Medicare Other | Source: Ambulatory Visit | Attending: Gastroenterology | Admitting: Gastroenterology

## 2012-01-02 DIAGNOSIS — B181 Chronic viral hepatitis B without delta-agent: Secondary | ICD-10-CM

## 2012-12-13 ENCOUNTER — Other Ambulatory Visit: Payer: Self-pay | Admitting: Gastroenterology

## 2012-12-13 DIAGNOSIS — B1910 Unspecified viral hepatitis B without hepatic coma: Secondary | ICD-10-CM

## 2012-12-23 ENCOUNTER — Ambulatory Visit
Admission: RE | Admit: 2012-12-23 | Discharge: 2012-12-23 | Disposition: A | Discharge status: Home / Self Care | Payer: Medicare Other | Source: Ambulatory Visit | Attending: Gastroenterology | Admitting: Gastroenterology

## 2012-12-23 DIAGNOSIS — B1910 Unspecified viral hepatitis B without hepatic coma: Secondary | ICD-10-CM

## 2013-12-01 ENCOUNTER — Ambulatory Visit
Admission: RE | Admit: 2013-12-01 | Discharge: 2013-12-01 | Disposition: A | Discharge status: Home / Self Care | Payer: Medicare Other | Source: Ambulatory Visit | Attending: Internal Medicine | Admitting: Internal Medicine

## 2013-12-01 ENCOUNTER — Other Ambulatory Visit: Payer: Self-pay | Admitting: Internal Medicine

## 2013-12-01 DIAGNOSIS — M79671 Pain in right foot: Secondary | ICD-10-CM

## 2014-01-29 ENCOUNTER — Other Ambulatory Visit: Payer: Self-pay | Admitting: Gastroenterology

## 2014-01-29 DIAGNOSIS — B1920 Unspecified viral hepatitis C without hepatic coma: Secondary | ICD-10-CM

## 2014-02-04 ENCOUNTER — Ambulatory Visit
Admission: RE | Admit: 2014-02-04 | Discharge: 2014-02-04 | Disposition: A | Discharge status: Home / Self Care | Payer: Medicare Other | Source: Ambulatory Visit | Attending: Gastroenterology | Admitting: Gastroenterology

## 2014-02-04 DIAGNOSIS — B1920 Unspecified viral hepatitis C without hepatic coma: Secondary | ICD-10-CM

## 2014-12-17 DIAGNOSIS — M179 Osteoarthritis of knee, unspecified: Secondary | ICD-10-CM | POA: Diagnosis not present

## 2014-12-17 DIAGNOSIS — F17200 Nicotine dependence, unspecified, uncomplicated: Secondary | ICD-10-CM | POA: Diagnosis not present

## 2014-12-17 DIAGNOSIS — I6789 Other cerebrovascular disease: Secondary | ICD-10-CM | POA: Diagnosis not present

## 2014-12-17 DIAGNOSIS — Z13220 Encounter for screening for lipoid disorders: Secondary | ICD-10-CM | POA: Diagnosis not present

## 2014-12-17 DIAGNOSIS — Z131 Encounter for screening for diabetes mellitus: Secondary | ICD-10-CM | POA: Diagnosis not present

## 2015-01-28 DIAGNOSIS — N3281 Overactive bladder: Secondary | ICD-10-CM | POA: Diagnosis not present

## 2015-01-28 DIAGNOSIS — I6789 Other cerebrovascular disease: Secondary | ICD-10-CM | POA: Diagnosis not present

## 2015-01-28 DIAGNOSIS — M179 Osteoarthritis of knee, unspecified: Secondary | ICD-10-CM | POA: Diagnosis not present

## 2015-02-16 ENCOUNTER — Encounter (HOSPITAL_COMMUNITY): Payer: Self-pay | Admitting: Physical Medicine and Rehabilitation

## 2015-02-16 ENCOUNTER — Emergency Department (HOSPITAL_COMMUNITY)
Admission: EM | Admit: 2015-02-16 | Discharge: 2015-02-16 | Disposition: A | Discharge status: Home / Self Care | Payer: Medicare Other | Attending: Emergency Medicine | Admitting: Emergency Medicine

## 2015-02-16 ENCOUNTER — Emergency Department (HOSPITAL_COMMUNITY): Payer: Medicare Other

## 2015-02-16 DIAGNOSIS — S99911A Unspecified injury of right ankle, initial encounter: Secondary | ICD-10-CM | POA: Diagnosis present

## 2015-02-16 DIAGNOSIS — Y9289 Other specified places as the place of occurrence of the external cause: Secondary | ICD-10-CM | POA: Insufficient documentation

## 2015-02-16 DIAGNOSIS — I10 Essential (primary) hypertension: Secondary | ICD-10-CM | POA: Diagnosis not present

## 2015-02-16 DIAGNOSIS — S8261XA Displaced fracture of lateral malleolus of right fibula, initial encounter for closed fracture: Secondary | ICD-10-CM | POA: Diagnosis not present

## 2015-02-16 DIAGNOSIS — W1839XA Other fall on same level, initial encounter: Secondary | ICD-10-CM | POA: Insufficient documentation

## 2015-02-16 DIAGNOSIS — Z8673 Personal history of transient ischemic attack (TIA), and cerebral infarction without residual deficits: Secondary | ICD-10-CM | POA: Diagnosis not present

## 2015-02-16 DIAGNOSIS — Y998 Other external cause status: Secondary | ICD-10-CM | POA: Insufficient documentation

## 2015-02-16 DIAGNOSIS — S82891A Other fracture of right lower leg, initial encounter for closed fracture: Secondary | ICD-10-CM

## 2015-02-16 DIAGNOSIS — Y9389 Activity, other specified: Secondary | ICD-10-CM | POA: Diagnosis not present

## 2015-02-16 HISTORY — DX: Essential (primary) hypertension: I10

## 2015-02-16 HISTORY — DX: Cerebral infarction, unspecified: I63.9

## 2015-02-16 NOTE — ED Notes (Addendum)
Pt presents to department for evaluation of fall x2 weeks ago, reports R ankle pain and swelling. No relief with medications at home. 5/10 pain. Pt also states she fell while trying to get into bath tub today. States she has trouble walking due to previous stroke.

## 2015-02-16 NOTE — Discharge Instructions (Signed)
You were evaluated in the ED today. Ankle pain. Your found to have a fracture, or broken ankle. You were given an ankle brace. It is important for you to follow-up with your orthopedist for further evaluation and management of your symptoms. Return to ED for new or worsening symptoms.  Ankle Fracture A fracture is a break in a bone. The ankle joint is made up of three bones. These include the lower (distal)sections of your lower leg bones, called the tibia and fibula, along with a bone in your foot, called the talus. Depending on how bad the break is and if more than one ankle joint bone is broken, a cast or splint is used to protect and keep your injured bone from moving while it heals. Sometimes, surgery is required to help the fracture heal properly.  There are two general types of fractures:  Stable fracture. This includes a single fracture line through one bone, with no injury to ankle ligaments. A fracture of the talus that does not have any displacement (movement of the bone on either side of the fracture line) is also stable.  Unstable fracture. This includes more than one fracture line through one or more bones in the ankle joint. It also includes fractures that have displacement of the bone on either side of the fracture line. CAUSES  A direct blow to the ankle.   Quickly and severely twisting your ankle.  Trauma, such as a car accident or falling from a significant height. RISK FACTORS You may be at a higher risk of ankle fracture if:  You have certain medical conditions.  You are involved in high-impact sports.  You are involved in a high-impact car accident. SIGNS AND SYMPTOMS   Tender and swollen ankle.  Bruising around the injured ankle.  Pain on movement of the ankle.  Difficulty walking or putting weight on the ankle.  A cold foot below the site of the ankle injury. This can occur if the blood vessels passing through your injured ankle were also  damaged.  Numbness in the foot below the site of the ankle injury. DIAGNOSIS  An ankle fracture is usually diagnosed with a physical exam and X-rays. A CT scan may also be required for complex fractures. TREATMENT  Stable fractures are treated with a cast or splint and using crutches to avoid putting weight on your injured ankle. This is followed by an ankle strengthening program. Some patients require a special type of cast, depending on other medical problems they may have. Unstable fractures require surgery to ensure the bones heal properly. Your health care provider will tell you what type of fracture you have and the best treatment for your condition. HOME CARE INSTRUCTIONS   Review correct crutch use with your health care provider and use your crutches as directed. Safe use of crutches is extremely important. Misuse of crutches can cause you to fall or cause injury to nerves in your hands or armpits.  Do not put weight or pressure on the injured ankle until directed by your health care provider.  To lessen the swelling, keep the injured leg elevated while sitting or lying down.  Apply ice to the injured area:  Put ice in a plastic bag.  Place a towel between your cast and the bag.  Leave the ice on for 20 minutes, 2-3 times a day.  If you have a plaster or fiberglass cast:  Do not try to scratch the skin under the cast with any objects. This can  increase your risk of skin infection.  Check the skin around the cast every day. You may put lotion on any red or sore areas.  Keep your cast dry and clean.  If you have a plaster splint:  Wear the splint as directed.  You may loosen the elastic around the splint if your toes become numb, tingle, or turn cold or blue.  Do not put pressure on any part of your cast or splint; it may break. Rest your cast only on a pillow the first 24 hours until it is fully hardened.  Your cast or splint can be protected during bathing with a  plastic bag sealed to your skin with medical tape. Do not lower the cast or splint into water.  Take medicines as directed by your health care provider. Only take over-the-counter or prescription medicines for pain, discomfort, or fever as directed by your health care provider.  Do not drive a vehicle until your health care provider specifically tells you it is safe to do so.  If your health care provider has given you a follow-up appointment, it is very important to keep that appointment. Not keeping the appointment could result in a chronic or permanent injury, pain, and disability. If you have any problem keeping the appointment, call the facility for assistance. SEEK MEDICAL CARE IF: You develop increased swelling or discomfort. SEEK IMMEDIATE MEDICAL CARE IF:   Your cast gets damaged or breaks.  You have continued severe pain.  You develop new pain or swelling after the cast was put on.  Your skin or toenails below the injury turn blue or gray.  Your skin or toenails below the injury feel cold, numb, or have loss of sensitivity to touch.  There is a bad smell or pus draining from under the cast. MAKE SURE YOU:   Understand these instructions.  Will watch your condition.  Will get help right away if you are not doing well or get worse. Document Released: 09/01/2000 Document Revised: 09/09/2013 Document Reviewed: 04/03/2013 [employer] Patient Information 2015 [place], [place]. This information is not intended to replace advice given to you by your health care provider. Make sure you discuss any questions you have with your health care provider.

## 2015-02-16 NOTE — ED Notes (Signed)
Pt is in stable condition upon d/c and is escorted from ED via wheelchair. 

## 2015-02-16 NOTE — ED Provider Notes (Signed)
69 year old female, right ankle pain after slipping and falling 2 weeks ago, has had ongoing swelling and pain with some difficulty ambulating, on exam she has swelling and tenderness over the lateral malleolus, this is consistent with her x-ray finding of a distal fibular fracture. She will need orthopedic follow-up, I have encouraged minimal weightbearing on that side, she is artery established with local orthopedics, she understands instructions and will be discharged home.  Medical screening examination/treatment/procedure(s) were conducted as a shared visit with non-physician practitioner(s) and myself.  I personally evaluated the patient during the encounter.  Clinical Impression:   Final diagnoses:  Closed right ankle fracture, initial encounter         Noemi Chapel, MD 02/17/15 1024

## 2015-02-16 NOTE — ED Provider Notes (Signed)
CSN: [id number]     Arrival date & time 02/16/15  1244 History   First MD Initiated Contact with Patient 02/16/15 1549     Chief Complaint  Patient presents with  . Fall  . Ankle Pain     (Consider location/radiation/quality/duration/timing/severity/associated sxs/prior Treatment) HPI Christine Christensen is a 69 y.o. female who comes in for evaluation of right ankle pain and swelling. Patient states approximately 2 weeks ago she was stepping off her front porch when she fell injuring her left knee and right ankle. She reports her left knee pain has resolved, but her right ankle pain has persisted. She has tried soaking in Epsom salts, which helped a little bit. She also tried wrapping her ankle and a brown paper bag soaked in vinegar for 3 days, but this did not work. She denies any pain at rest, but pain is exacerbated to the outside of her right ankle with weightbearing, but improves with walking. Reports a second fall this morning as she was trying to get in the bathtub, but denies hitting her ankle again and was immediately ambulatory. Patient states she is currently using a cane and has a walker at home. Denies numbness, weakness, wounds, redness, cool sensation.  Past Medical History  Diagnosis Date  . Stroke   . Hypertension    History reviewed. No pertinent past surgical history. No family history on file. History  Substance Use Topics  . Smoking status: Current Every Day Smoker    Types: Cigarettes  . Smokeless tobacco: Not on file  . Alcohol Use: No   OB History    No data available     Review of Systems A 10 point review of systems was completed and was negative except for pertinent positives and negatives as mentioned in the history of present illness     Allergies  Review of patient's allergies indicates no known allergies.  Home Medications   Prior to Admission medications   Not on File   BP 140/56 mmHg  Pulse 71  Temp(Src) 98.7 F (37.1 C) (Oral)  Resp 18   SpO2 98% Physical Exam  Constitutional: She is oriented to person, place, and time. She appears well-developed and well-nourished.  HENT:  Head: Normocephalic and atraumatic.  Mouth/Throat: Oropharynx is clear and moist.  Eyes: Conjunctivae are normal. Pupils are equal, round, and reactive to light. Right eye exhibits no discharge. Left eye exhibits no discharge. No scleral icterus.  Neck: Neck supple.  Cardiovascular: Normal rate, regular rhythm and normal heart sounds.   Pulmonary/Chest: Effort normal and breath sounds normal. No respiratory distress. She has no wheezes. She has no rales.  Abdominal: Soft. There is no tenderness.  Musculoskeletal: She exhibits no tenderness.  Diffuse edema to right ankle, no erythema. Distal pulses intact. Sensation intact to light touch. Patient maintains full active range of motion of right ankle. Full active range of motion of right knee with no tenderness to the fibular head. Compartments soft.  Neurological: She is alert and oriented to person, place, and time.  Cranial Nerves II-XII grossly intact  Skin: Skin is warm and dry. No rash noted.  Psychiatric: She has a normal mood and affect.  Nursing note and vitals reviewed.   ED Course  Procedures (including critical care time) Labs Review Labs Reviewed - No data to display  Imaging Review Dg Ankle Complete Right  02/16/2015   CLINICAL DATA:  [place] 2 weeks ago, RIGHT ankle pain and swelling, no relief with medication at home, fell  again today trying to get in bathtub, trouble walking from prior stroke  EXAM: RIGHT ANKLE - COMPLETE 3+ VIEW  COMPARISON:  12/02/18 RIGHT 15 foot radiographs  FINDINGS: Examination limited by patient motion.  Osseous demineralization.  Significant soft tissue swelling greatest laterally and extending anteriorly.  Ankle joint space preserved.  Mildly displaced fracture of the lateral malleolus.  No additional fracture, dislocation, or bone destruction.  IMPRESSION: Mildly  displaced lateral malleolar fracture RIGHT ankle.   Electronically Signed   By: Lavonia Dana M.D.   On: 02/16/2015 14:12     EKG Interpretation None     Meds given in ED:  Medications - No data to display  New Prescriptions   No medications on file   Filed Vitals:   02/16/15 1305 02/16/15 1536  BP: 114/71 140/56  Pulse: 70 71  Temp: 98.7 F (37.1 C)   TempSrc: Oral   Resp: 18 18  SpO2: 95% 98%    MDM  Vitals stable - WNL -afebrile Pt resting comfortably in ED. Imaging--plain films of the right ankle show a mildly displaced fracture of lateral malleolus. No other dislocations or osseous abnormalities.  DDX--no evidence of open fracture, compartment syndrome, vascular compromise. Will place patient in cam walker boot and have her follow-up with her orthopedist at [facility] for definitive care. States she has pain medicines at home that she will be able to take if needed. Also has a cane and walker at home that she will be able to use.  I discussed all relevant lab findings and imaging results with pt and they verbalized understanding. Discussed f/u with PCP within 48 hrs and return precautions, pt very amenable to plan. Prior to patient discharge, I discussed and reviewed this case with Dr. Sabra Heck, who also saw and evaluated the patient    Final diagnoses:  Closed right ankle fracture, initial encounter        Comer Locket, PA-C 02/16/15 [place], MD 02/17/15 1024

## 2015-02-25 DIAGNOSIS — M25562 Pain in left knee: Secondary | ICD-10-CM | POA: Diagnosis not present

## 2015-02-25 DIAGNOSIS — S82892D Other fracture of left lower leg, subsequent encounter for closed fracture with routine healing: Secondary | ICD-10-CM | POA: Diagnosis not present

## 2015-02-25 DIAGNOSIS — S82831D Other fracture of upper and lower end of right fibula, subsequent encounter for closed fracture with routine healing: Secondary | ICD-10-CM | POA: Diagnosis not present

## 2015-02-25 DIAGNOSIS — M25571 Pain in right ankle and joints of right foot: Secondary | ICD-10-CM | POA: Diagnosis not present

## 2015-03-18 DIAGNOSIS — S82891D Other fracture of right lower leg, subsequent encounter for closed fracture with routine healing: Secondary | ICD-10-CM | POA: Diagnosis not present

## 2015-03-18 DIAGNOSIS — M25562 Pain in left knee: Secondary | ICD-10-CM | POA: Diagnosis not present

## 2015-03-18 DIAGNOSIS — S82891A Other fracture of right lower leg, initial encounter for closed fracture: Secondary | ICD-10-CM | POA: Diagnosis not present

## 2015-03-18 DIAGNOSIS — S82892D Other fracture of left lower leg, subsequent encounter for closed fracture with routine healing: Secondary | ICD-10-CM | POA: Diagnosis not present

## 2015-03-19 DIAGNOSIS — N3281 Overactive bladder: Secondary | ICD-10-CM | POA: Diagnosis not present

## 2015-04-15 DIAGNOSIS — S82891D Other fracture of right lower leg, subsequent encounter for closed fracture with routine healing: Secondary | ICD-10-CM | POA: Diagnosis not present

## 2015-04-21 DIAGNOSIS — S82891S Other fracture of right lower leg, sequela: Secondary | ICD-10-CM | POA: Diagnosis not present

## 2015-04-21 DIAGNOSIS — M6281 Muscle weakness (generalized): Secondary | ICD-10-CM | POA: Diagnosis not present

## 2015-04-21 DIAGNOSIS — R262 Difficulty in walking, not elsewhere classified: Secondary | ICD-10-CM | POA: Diagnosis not present

## 2015-04-23 DIAGNOSIS — N3281 Overactive bladder: Secondary | ICD-10-CM | POA: Diagnosis not present

## 2015-04-29 DIAGNOSIS — I6789 Other cerebrovascular disease: Secondary | ICD-10-CM | POA: Diagnosis not present

## 2015-04-29 DIAGNOSIS — S82899A Other fracture of unspecified lower leg, initial encounter for closed fracture: Secondary | ICD-10-CM | POA: Diagnosis not present

## 2015-04-29 DIAGNOSIS — M179 Osteoarthritis of knee, unspecified: Secondary | ICD-10-CM | POA: Diagnosis not present

## 2015-04-29 DIAGNOSIS — E2839 Other primary ovarian failure: Secondary | ICD-10-CM | POA: Diagnosis not present

## 2015-04-29 DIAGNOSIS — N3281 Overactive bladder: Secondary | ICD-10-CM | POA: Diagnosis not present

## 2015-05-06 ENCOUNTER — Other Ambulatory Visit: Payer: Self-pay | Admitting: Internal Medicine

## 2015-05-06 DIAGNOSIS — E2839 Other primary ovarian failure: Secondary | ICD-10-CM

## 2015-05-11 DIAGNOSIS — N390 Urinary tract infection, site not specified: Secondary | ICD-10-CM | POA: Diagnosis not present

## 2015-05-11 DIAGNOSIS — N3281 Overactive bladder: Secondary | ICD-10-CM | POA: Diagnosis not present

## 2015-05-17 ENCOUNTER — Other Ambulatory Visit: Payer: Medicare Other

## 2015-05-20 ENCOUNTER — Ambulatory Visit
Admission: RE | Admit: 2015-05-20 | Discharge: 2015-05-20 | Disposition: A | Discharge status: Home / Self Care | Payer: Medicare Other | Source: Ambulatory Visit | Attending: Internal Medicine | Admitting: Internal Medicine

## 2015-05-20 DIAGNOSIS — B199 Unspecified viral hepatitis without hepatic coma: Secondary | ICD-10-CM | POA: Diagnosis not present

## 2015-05-20 DIAGNOSIS — Z780 Asymptomatic menopausal state: Secondary | ICD-10-CM | POA: Diagnosis not present

## 2015-05-20 DIAGNOSIS — E2839 Other primary ovarian failure: Secondary | ICD-10-CM

## 2015-05-20 DIAGNOSIS — B349 Viral infection, unspecified: Secondary | ICD-10-CM | POA: Diagnosis not present

## 2015-05-25 ENCOUNTER — Other Ambulatory Visit: Payer: Self-pay | Admitting: Gastroenterology

## 2015-05-25 DIAGNOSIS — C220 Liver cell carcinoma: Secondary | ICD-10-CM

## 2015-05-28 ENCOUNTER — Ambulatory Visit
Admission: RE | Admit: 2015-05-28 | Discharge: 2015-05-28 | Disposition: A | Discharge status: Home / Self Care | Payer: Medicare Other | Source: Ambulatory Visit | Attending: Gastroenterology | Admitting: Gastroenterology

## 2015-05-28 DIAGNOSIS — C220 Liver cell carcinoma: Secondary | ICD-10-CM

## 2015-05-28 DIAGNOSIS — B1910 Unspecified viral hepatitis B without hepatic coma: Secondary | ICD-10-CM | POA: Diagnosis not present

## 2015-06-01 DIAGNOSIS — Z1231 Encounter for screening mammogram for malignant neoplasm of breast: Secondary | ICD-10-CM | POA: Diagnosis not present

## 2015-07-29 DIAGNOSIS — I6789 Other cerebrovascular disease: Secondary | ICD-10-CM | POA: Diagnosis not present

## 2015-07-29 DIAGNOSIS — F419 Anxiety disorder, unspecified: Secondary | ICD-10-CM | POA: Diagnosis not present

## 2015-07-29 DIAGNOSIS — Z23 Encounter for immunization: Secondary | ICD-10-CM | POA: Diagnosis not present

## 2015-07-29 DIAGNOSIS — M899 Disorder of bone, unspecified: Secondary | ICD-10-CM | POA: Diagnosis not present

## 2015-07-29 DIAGNOSIS — M179 Osteoarthritis of knee, unspecified: Secondary | ICD-10-CM | POA: Diagnosis not present

## 2015-07-29 DIAGNOSIS — Z1211 Encounter for screening for malignant neoplasm of colon: Secondary | ICD-10-CM | POA: Diagnosis not present

## 2015-08-31 ENCOUNTER — Other Ambulatory Visit (HOSPITAL_COMMUNITY): Payer: Self-pay | Admitting: Internal Medicine

## 2015-08-31 DIAGNOSIS — R011 Cardiac murmur, unspecified: Secondary | ICD-10-CM | POA: Diagnosis not present

## 2015-08-31 DIAGNOSIS — I6789 Other cerebrovascular disease: Secondary | ICD-10-CM | POA: Diagnosis not present

## 2015-08-31 DIAGNOSIS — S335XXA Sprain of ligaments of lumbar spine, initial encounter: Secondary | ICD-10-CM | POA: Diagnosis not present

## 2015-08-31 DIAGNOSIS — M25519 Pain in unspecified shoulder: Secondary | ICD-10-CM | POA: Diagnosis not present

## 2015-08-31 DIAGNOSIS — M179 Osteoarthritis of knee, unspecified: Secondary | ICD-10-CM | POA: Diagnosis not present

## 2015-09-06 ENCOUNTER — Ambulatory Visit (HOSPITAL_COMMUNITY)
Admission: RE | Admit: 2015-09-06 | Discharge: 2015-09-06 | Disposition: A | Discharge status: Home / Self Care | Payer: Medicare Other | Source: Ambulatory Visit | Attending: Gastroenterology | Admitting: Gastroenterology

## 2015-09-06 DIAGNOSIS — R011 Cardiac murmur, unspecified: Secondary | ICD-10-CM | POA: Diagnosis not present

## 2015-09-06 DIAGNOSIS — I517 Cardiomegaly: Secondary | ICD-10-CM | POA: Insufficient documentation

## 2016-05-29 ENCOUNTER — Ambulatory Visit (INDEPENDENT_AMBULATORY_CARE_PROVIDER_SITE_OTHER): Payer: Medicare Other

## 2016-05-29 ENCOUNTER — Encounter (HOSPITAL_COMMUNITY): Payer: Self-pay | Admitting: Emergency Medicine

## 2016-05-29 ENCOUNTER — Ambulatory Visit (HOSPITAL_COMMUNITY)
Admission: EM | Admit: 2016-05-29 | Discharge: 2016-05-29 | Disposition: A | Discharge status: Home / Self Care | Payer: Medicare Other | Attending: Family Medicine | Admitting: Family Medicine

## 2016-05-29 DIAGNOSIS — M25562 Pain in left knee: Secondary | ICD-10-CM | POA: Diagnosis not present

## 2016-05-29 DIAGNOSIS — M1712 Unilateral primary osteoarthritis, left knee: Secondary | ICD-10-CM

## 2016-05-29 DIAGNOSIS — W19XXXA Unspecified fall, initial encounter: Secondary | ICD-10-CM

## 2016-05-29 MED ORDER — DICLOFENAC POTASSIUM 50 MG PO TABS: 50.0000 mg | ORAL_TABLET | Freq: Three times a day (TID) | ORAL | 0 refills | Status: DC

## 2016-05-29 NOTE — Discharge Instructions (Signed)
Cold compresses today and Tuesday,  Then start heat on Wednesday.  See your knee doctor for follow up

## 2016-05-29 NOTE — ED Triage Notes (Signed)
The patient presented to the [facility] with a complaint of left knee pain secondary to a fall that occurred today at church.

## 2016-05-30 NOTE — ED Provider Notes (Signed)
CSN: [id number]     Arrival date & time 05/29/16  1726 History   First MD Initiated Contact with Patient 05/29/16 1906     Chief Complaint  Patient presents with  . Knee Pain   (Consider location/radiation/quality/duration/timing/severity/associated sxs/prior Treatment) HPI History obtained from patient:  Pt presents with the cc of:  Left knee pain and injury Duration of symptoms: Several hours Treatment prior to arrival: Cold compresses Tylenol Context: Patient slipped and fell at church this morning injuring her left knee Other symptoms include: Hurts to ambulate Pain score: 3 FAMILY HISTORY: Hypertension    Past Medical History:  Diagnosis Date  . Hypertension   . Stroke [facility])    History reviewed. No pertinent surgical history. History reviewed. No pertinent family history. Social History  Substance Use Topics  . Smoking status: Current Every Day Smoker    Types: Cigarettes  . Smokeless tobacco: Not on file  . Alcohol use No   OB History    No data available     Review of Systems  Denies: HEADACHE, NAUSEA, ABDOMINAL PAIN, CHEST PAIN, CONGESTION, DYSURIA, SHORTNESS OF BREATH  Allergies  Review of patient's allergies indicates no known allergies.  Home Medications   Prior to Admission medications   Medication Sig Start Date End Date Taking? Authorizing Provider  aspirin 325 MG tablet Take 325 mg by mouth daily.   Yes Historical Provider, MD  escitalopram (LEXAPRO) 10 MG tablet Take 10 mg by mouth daily.   Yes Historical Provider, MD  diclofenac (CATAFLAM) 50 MG tablet Take 1 tablet (50 mg total) by mouth 3 (three) times daily. 05/29/16   Konrad Felix, PA   Meds Ordered and Administered this Visit  Medications - No data to display  BP 161/73 (BP Location: Right Arm)   Pulse 69   Temp 98.8 F (37.1 C) (Oral)   Resp 14   SpO2 100%  No data found.   Physical Exam NURSES NOTES AND VITAL SIGNS REVIEWED. CONSTITUTIONAL: Well developed, well nourished, no  acute distress HEENT: normocephalic, atraumatic EYES: Conjunctiva normal NECK:normal ROM, supple, no adenopathy PULMONARY:No respiratory distress, normal effort ABDOMINAL: Soft, ND, NT BS+, No CVAT MUSCULOSKELETAL: Normal ROM of all extremities, Left knee is mildly swollen tender to palpation medially. Joint is otherwise stable patella tendon is intact. Sensorimotor function intact distally. SKIN: warm and dry without rash PSYCHIATRIC: Mood and affect, behavior are normal  Urgent Care Course   Clinical Course    Procedures (including critical care time)  Labs Review Labs Reviewed - No data to display  Imaging Review Dg Knee Complete 4 Views Left  Result Date: 05/29/2016 CLINICAL DATA:  Status post fall today with a left knee injury. Pain. Initial encounter. EXAM: LEFT KNEE - COMPLETE 4+ VIEW COMPARISON:  None. FINDINGS: There is no acute bony or joint abnormality. No joint effusion. Tricompartmental osteoarthritis appears worst medially where it is advanced. IMPRESSION: No acute abnormality. Osteoarthritis, worst about the medial compartment. Electronically Signed   By: Inge Rise M.D.   On: 05/29/2016 19:28   Discussed with patient at discharge  Visual Acuity Review  Right Eye Distance:   Left Eye Distance:   Bilateral Distance:    Right Eye Near:   Left Eye Near:    Bilateral Near:         MDM   1. Primary osteoarthritis of left knee   2. Knee pain, acute, left   3. Fall, initial encounter     Patient is reassured that there are  no issues that require transfer to higher level of care at this time or additional tests. Patient is advised to continue home symptomatic treatment. Patient is advised that if there are new or worsening symptoms to attend the emergency department, contact primary care provider, or return to UC. Instructions of care provided discharged home in stable condition.    THIS NOTE WAS GENERATED USING A VOICE RECOGNITION SOFTWARE PROGRAM.  ALL REASONABLE EFFORTS  WERE MADE TO PROOFREAD THIS DOCUMENT FOR ACCURACY.  I have verbally reviewed the discharge instructions with the patient. A printed AVS was given to the patient.  All questions were answered prior to discharge.      Konrad Felix, [place] 05/30/16 2050

## 2016-05-31 IMAGING — US US ABDOMEN COMPLETE
1 series · 14 of 25 positions shown · non-contrast
Comparison: 02/04/2014

CLINICAL DATA: Hepatoma surveillance. Hepatitis-B. Prior
cholecystectomy. History of fatty liver. Asymptomatic.

EXAM:
ULTRASOUND ABDOMEN COMPLETE

[Series 1: us abdomen complete · 0.28mm/px · 14 of 63 slices shown]
[im 1/63]
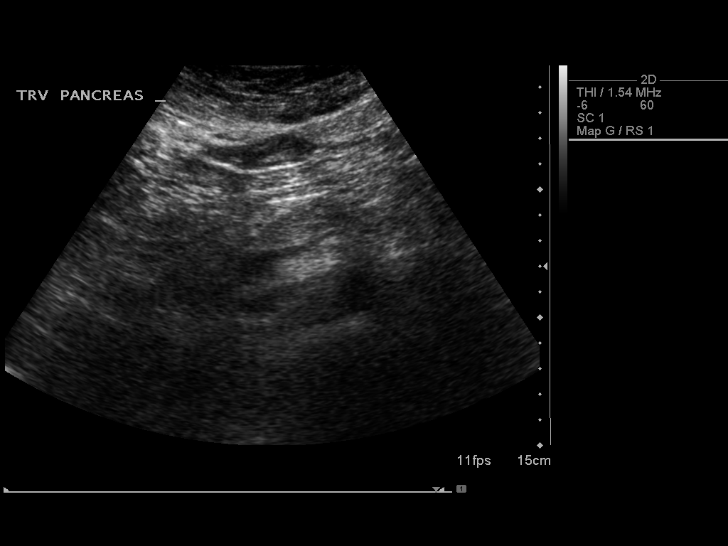
[im 6/63]
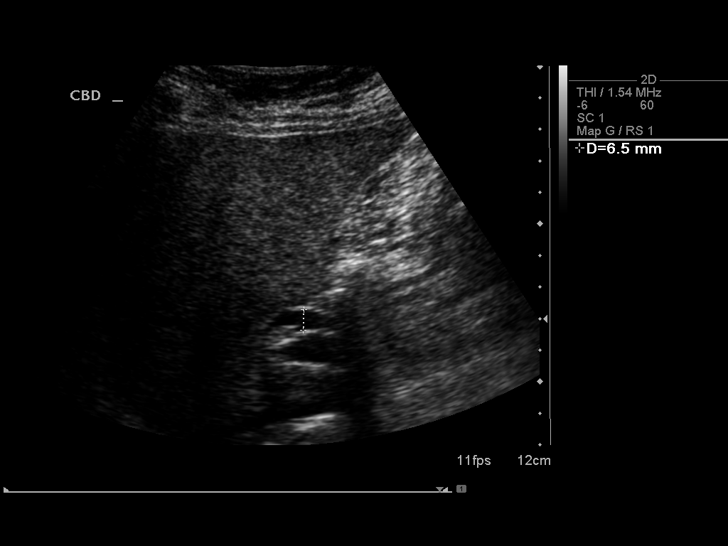
[im 11/63]
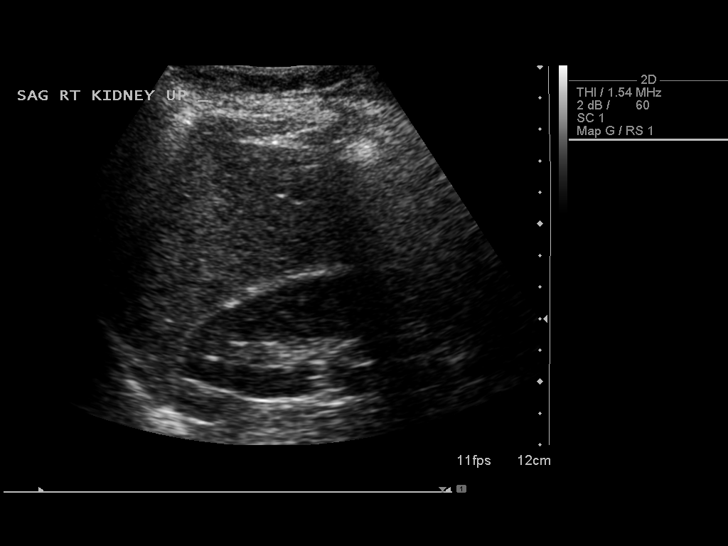
[im 16/63]
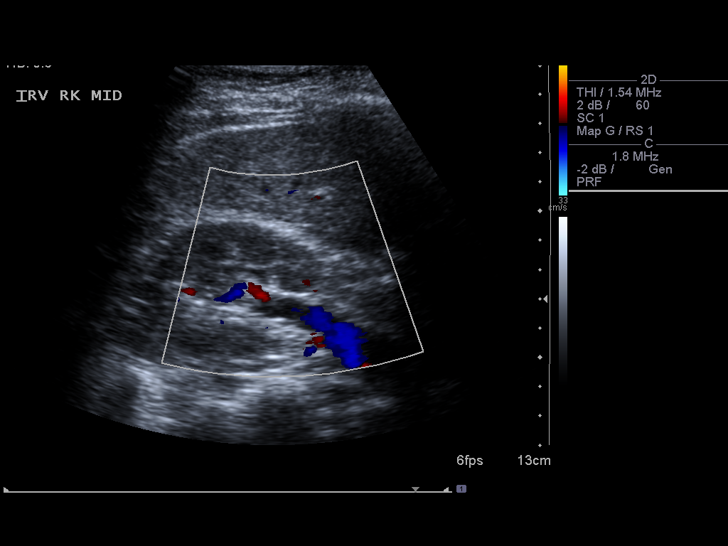
[im 21/63]
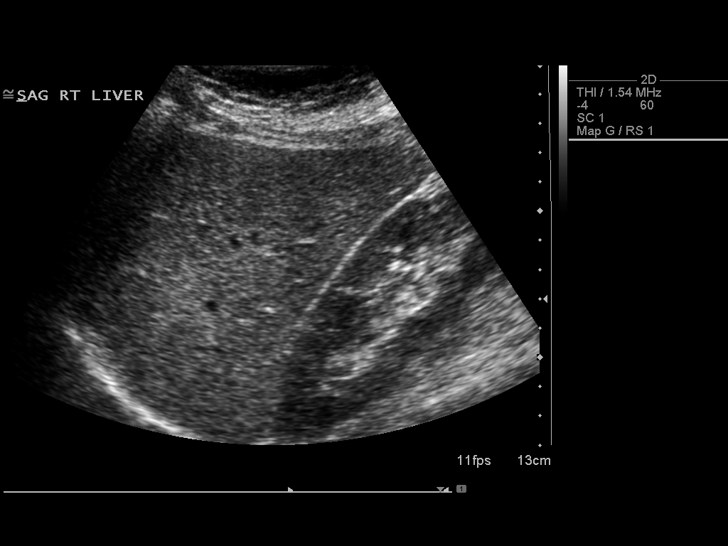
[im 24/63]
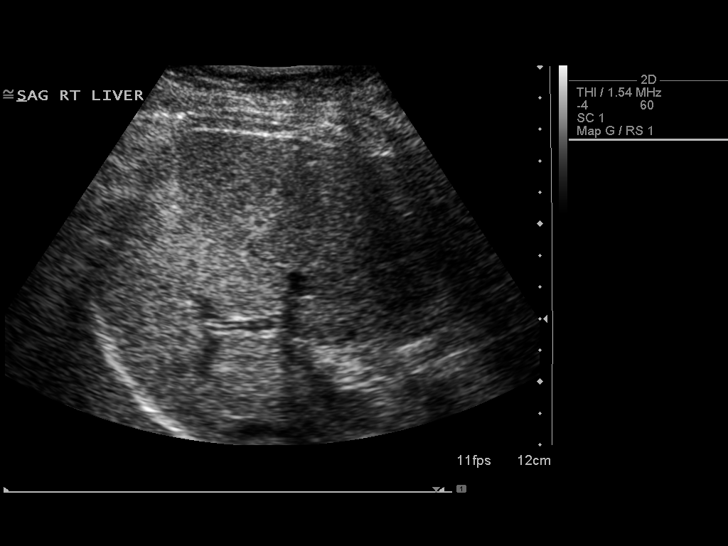
[im 29/63]
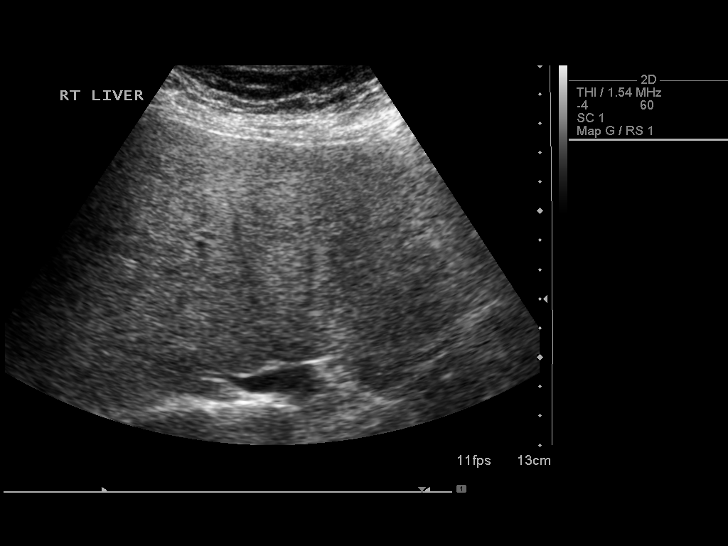
[im 34/63]
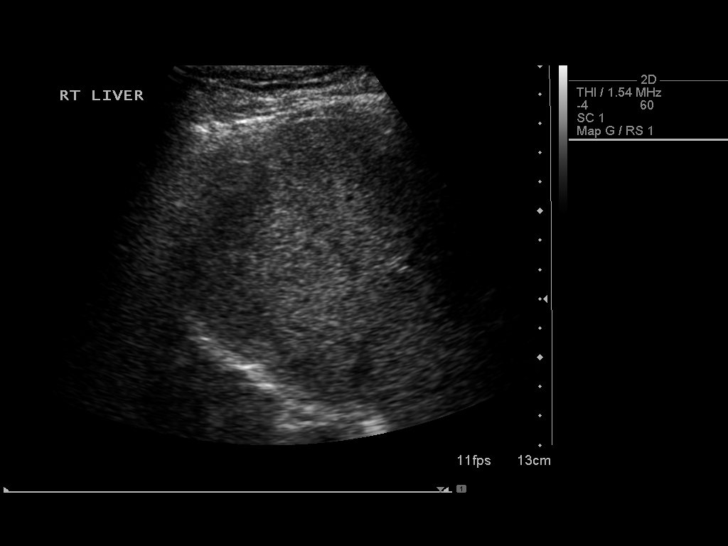
[im 39/63]
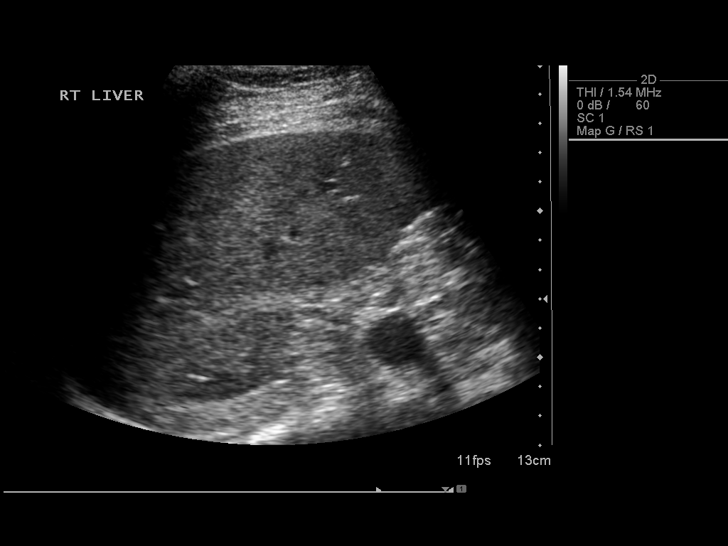
[im 42/63]
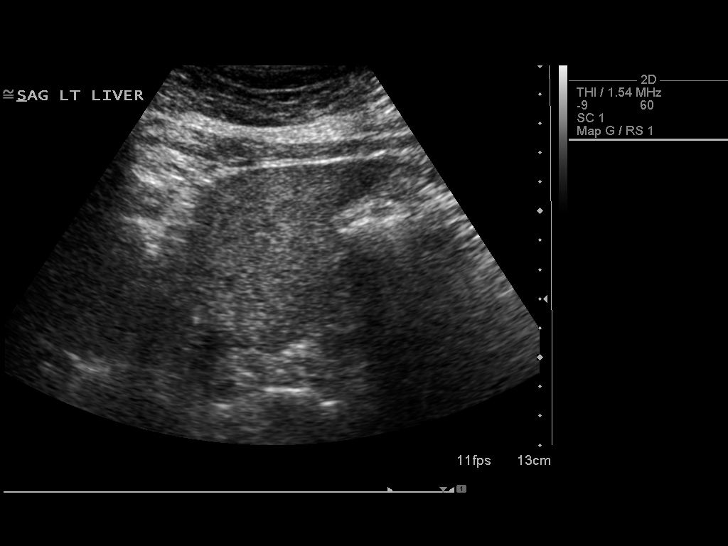
[im 47/63]
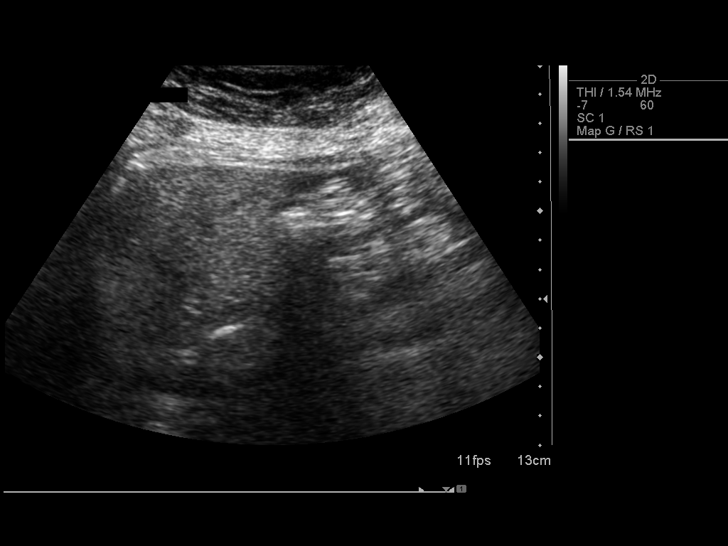
[im 52/63]
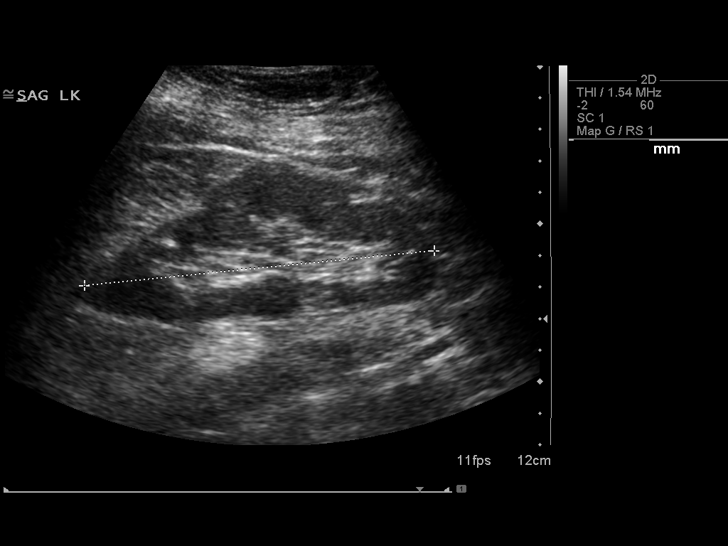
[im 57/63]
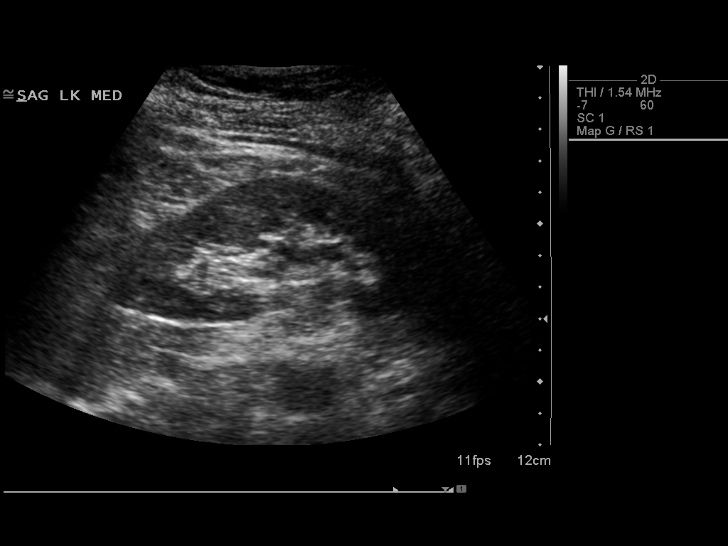
[im 63/63]
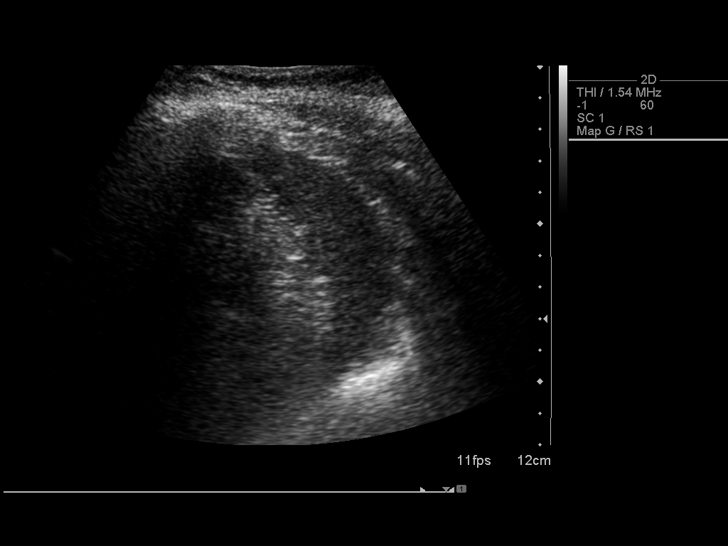

[14 of 25 positions shown; findings below may reference images not displayed]

FINDINGS: Gallbladder: Surgically absent.

Common bile duct: Diameter: 6.5 mm

Liver: Inhomogeneous. The liver is echogenic. There is attenuation
of the ultrasound wave, poor visualization of the internal hepatic
architecture, and loss of definition of the diaphragm. No focal
liver lesions are identified.

IVC: No abnormality visualized.  Limited views possible.

Pancreas: Visualized portion unremarkable.  Limited views possible.

Spleen: Size and appearance within normal limits.

Right Kidney: Length: 12.3 cm. Echogenicity within normal limits. No
mass or hydronephrosis visualized.

Left Kidney: Length: 11.2 cm. Echogenicity within normal limits. No
mass or hydronephrosis visualized.

Abdominal aorta: 2.6 cm maximum diameter. Distal aortic
atherosclerotic plaque identified.

Other findings: None.
IMPRESSION: 1. Fatty liver.  No focal liver lesions identified.
2. No hydronephrosis.
3. Status post cholecystectomy.
4. Atherosclerosis of the abdominal aorta.

## 2016-11-07 DIAGNOSIS — H35033 Hypertensive retinopathy, bilateral: Secondary | ICD-10-CM | POA: Diagnosis not present

## 2016-11-07 DIAGNOSIS — H40013 Open angle with borderline findings, low risk, bilateral: Secondary | ICD-10-CM | POA: Diagnosis not present

## 2016-11-07 DIAGNOSIS — H11423 Conjunctival edema, bilateral: Secondary | ICD-10-CM | POA: Diagnosis not present

## 2016-11-07 DIAGNOSIS — H26493 Other secondary cataract, bilateral: Secondary | ICD-10-CM | POA: Diagnosis not present

## 2016-11-07 DIAGNOSIS — H18413 Arcus senilis, bilateral: Secondary | ICD-10-CM | POA: Diagnosis not present

## 2016-11-07 DIAGNOSIS — H3589 Other specified retinal disorders: Secondary | ICD-10-CM | POA: Diagnosis not present

## 2016-11-07 DIAGNOSIS — I10 Essential (primary) hypertension: Secondary | ICD-10-CM | POA: Diagnosis not present

## 2016-11-07 DIAGNOSIS — Z961 Presence of intraocular lens: Secondary | ICD-10-CM | POA: Diagnosis not present

## 2016-11-07 DIAGNOSIS — H11153 Pinguecula, bilateral: Secondary | ICD-10-CM | POA: Diagnosis not present

## 2016-11-07 DIAGNOSIS — H40003 Preglaucoma, unspecified, bilateral: Secondary | ICD-10-CM | POA: Diagnosis not present

## 2016-11-29 DIAGNOSIS — F419 Anxiety disorder, unspecified: Secondary | ICD-10-CM | POA: Diagnosis not present

## 2016-11-29 DIAGNOSIS — I6789 Other cerebrovascular disease: Secondary | ICD-10-CM | POA: Diagnosis not present

## 2016-11-29 DIAGNOSIS — M179 Osteoarthritis of knee, unspecified: Secondary | ICD-10-CM | POA: Diagnosis not present

## 2016-11-29 DIAGNOSIS — N3281 Overactive bladder: Secondary | ICD-10-CM | POA: Diagnosis not present

## 2017-05-22 DIAGNOSIS — I6789 Other cerebrovascular disease: Secondary | ICD-10-CM | POA: Diagnosis not present

## 2017-05-22 DIAGNOSIS — M179 Osteoarthritis of knee, unspecified: Secondary | ICD-10-CM | POA: Diagnosis not present

## 2017-05-22 DIAGNOSIS — F17210 Nicotine dependence, cigarettes, uncomplicated: Secondary | ICD-10-CM | POA: Diagnosis not present

## 2017-05-22 DIAGNOSIS — N3281 Overactive bladder: Secondary | ICD-10-CM | POA: Diagnosis not present

## 2017-05-22 DIAGNOSIS — Z716 Tobacco abuse counseling: Secondary | ICD-10-CM | POA: Diagnosis not present

## 2017-06-21 ENCOUNTER — Other Ambulatory Visit: Payer: Self-pay | Admitting: Orthopedic Surgery

## 2017-07-18 ENCOUNTER — Other Ambulatory Visit (HOSPITAL_COMMUNITY): Payer: Self-pay | Admitting: Internal Medicine

## 2017-07-18 DIAGNOSIS — F17200 Nicotine dependence, unspecified, uncomplicated: Secondary | ICD-10-CM | POA: Diagnosis not present

## 2017-07-18 DIAGNOSIS — Z716 Tobacco abuse counseling: Secondary | ICD-10-CM | POA: Diagnosis not present

## 2017-07-18 DIAGNOSIS — R011 Cardiac murmur, unspecified: Secondary | ICD-10-CM | POA: Diagnosis not present

## 2017-07-18 DIAGNOSIS — I6789 Other cerebrovascular disease: Secondary | ICD-10-CM | POA: Diagnosis not present

## 2017-07-18 DIAGNOSIS — R55 Syncope and collapse: Secondary | ICD-10-CM | POA: Diagnosis not present

## 2017-07-18 DIAGNOSIS — M179 Osteoarthritis of knee, unspecified: Secondary | ICD-10-CM | POA: Diagnosis not present

## 2017-07-26 ENCOUNTER — Encounter (HOSPITAL_COMMUNITY)
Admission: RE | Admit: 2017-07-26 | Discharge: 2017-07-26 | Disposition: A | Discharge status: Home / Self Care | Payer: PPO | Source: Ambulatory Visit | Attending: Orthopedic Surgery | Admitting: Orthopedic Surgery

## 2017-07-26 ENCOUNTER — Encounter (HOSPITAL_COMMUNITY): Payer: Self-pay

## 2017-07-26 DIAGNOSIS — Z79899 Other long term (current) drug therapy: Secondary | ICD-10-CM | POA: Diagnosis not present

## 2017-07-26 DIAGNOSIS — I080 Rheumatic disorders of both mitral and aortic valves: Secondary | ICD-10-CM | POA: Insufficient documentation

## 2017-07-26 DIAGNOSIS — R32 Unspecified urinary incontinence: Secondary | ICD-10-CM | POA: Diagnosis not present

## 2017-07-26 DIAGNOSIS — I10 Essential (primary) hypertension: Secondary | ICD-10-CM | POA: Insufficient documentation

## 2017-07-26 DIAGNOSIS — Z9049 Acquired absence of other specified parts of digestive tract: Secondary | ICD-10-CM | POA: Diagnosis not present

## 2017-07-26 DIAGNOSIS — Z01818 Encounter for other preprocedural examination: Secondary | ICD-10-CM | POA: Diagnosis not present

## 2017-07-26 DIAGNOSIS — Z7982 Long term (current) use of aspirin: Secondary | ICD-10-CM | POA: Diagnosis not present

## 2017-07-26 DIAGNOSIS — Z01812 Encounter for preprocedural laboratory examination: Secondary | ICD-10-CM | POA: Diagnosis not present

## 2017-07-26 DIAGNOSIS — I69351 Hemiplegia and hemiparesis following cerebral infarction affecting right dominant side: Secondary | ICD-10-CM | POA: Diagnosis not present

## 2017-07-26 HISTORY — DX: Unspecified osteoarthritis, unspecified site: M19.90

## 2017-07-26 HISTORY — DX: Rheumatic mitral stenosis: I05.0

## 2017-07-26 HISTORY — DX: Anemia, unspecified: D64.9

## 2017-07-26 HISTORY — DX: Nonrheumatic aortic (valve) stenosis: I35.0

## 2017-07-26 HISTORY — DX: Unspecified urinary incontinence: R32

## 2017-07-26 HISTORY — DX: Cardiac murmur, unspecified: R01.1

## 2017-07-26 LAB — CBC WITH DIFFERENTIAL/PLATELET
Basophils Absolute: 0 10*3/uL (ref 0.0–0.1)
Basophils Relative: 1 %
EOS ABS: 0.1 10*3/uL (ref 0.0–0.7)
Eosinophils Relative: 2 %
HEMATOCRIT: 37.2 % (ref 36.0–46.0)
HEMOGLOBIN: 12.1 g/dL (ref 12.0–15.0)
LYMPHS ABS: 2.3 10*3/uL (ref 0.7–4.0)
Lymphocytes Relative: 34 %
MCH: 28.7 pg (ref 26.0–34.0)
MCHC: 32.5 g/dL (ref 30.0–36.0)
MCV: 88.2 fL (ref 78.0–100.0)
MONOS PCT: 7 %
Monocytes Absolute: 0.5 10*3/uL (ref 0.1–1.0)
NEUTROS ABS: 3.9 10*3/uL (ref 1.7–7.7)
NEUTROS PCT: 56 %
Platelets: 283 10*3/uL (ref 150–400)
RBC: 4.22 MIL/uL (ref 3.87–5.11)
RDW: 15 % (ref 11.5–15.5)
WBC: 6.9 10*3/uL (ref 4.0–10.5)

## 2017-07-26 LAB — COMPREHENSIVE METABOLIC PANEL
ALBUMIN: 3.4 g/dL — AB (ref 3.5–5.0)
ALK PHOS: 63 U/L (ref 38–126)
ALT: 10 U/L — AB (ref 14–54)
ANION GAP: 6 (ref 5–15)
AST: 14 U/L — ABNORMAL LOW (ref 15–41)
BILIRUBIN TOTAL: 0.4 mg/dL (ref 0.3–1.2)
BUN: 12 mg/dL (ref 6–20)
CALCIUM: 8.9 mg/dL (ref 8.9–10.3)
CO2: 26 mmol/L (ref 22–32)
CREATININE: 0.8 mg/dL (ref 0.44–1.00)
Chloride: 108 mmol/L (ref 101–111)
GFR calc non Af Amer: >60 mL/min (ref >60)
GLUCOSE: 84 mg/dL (ref 65–99)
Potassium: 3.5 mmol/L (ref 3.5–5.1)
SODIUM: 140 mmol/L (ref 135–145)
TOTAL PROTEIN: 6.6 g/dL (ref 6.5–8.1)

## 2017-07-26 LAB — SURGICAL PCR SCREEN
MRSA, PCR: NEGATIVE
Staphylococcus aureus: NEGATIVE

## 2017-07-26 NOTE — Progress Notes (Addendum)
PCP - Nolene Ebbs, last office note, echo and EKG requested from PCP Cardiologist - denies  EKG - requested ECHO - requested   Pt states in the past couple of weeks she got hot in the kitchen and was perspiring, "found herself on the floor" but did not lose consciousness. Pt states she saw Dr. Jeanie Cooks who did an EKG and will do an ECHO on 07/27/17.   Pt states she takes aspirin d/t past strokes. Pt states Dr. Jeanie Cooks instructed her not to stop taking Aspirin, but Dr. Ruel Favors office requested that she stop aspirin (pt unsure of which day). Pt states she will call Dr. Ruel Favors office regarding this to clarify.   Patient denies shortness of breath, fever, cough and chest pain at PAT appointment   Patient verbalized understanding of instructions that were given to them at the PAT appointment. Patient was also instructed that they will need to review over the PAT instructions again at home before surgery.

## 2017-07-26 NOTE — Pre-Procedure Instructions (Signed)
Christine Christensen  07/26/2017      [id number] [address], [place] [address] Phone: [phone] Fax: [phone]    Your procedure is scheduled on Monday November 19.  Report to [facility] Admitting at 6:15 A.M.  Call this number if you have problems the morning of surgery:  [phone]   Remember:  Do not eat food or drink liquids after midnight.  Take these medicines the morning of surgery with A SIP OF WATER: escitalopram (lexapro)  7 days prior to surgery STOP taking any Aspirin (unless otherwise instructed by your surgeon), Aleve, Naproxen, Ibuprofen, Motrin, Advil, Goody's, BC's, all herbal medications, fish oil, and all vitamins    Do not wear jewelry, make-up or nail polish.  Do not wear lotions, powders, or perfumes, or deoderant.  Do not shave 48 hours prior to surgery.  Men may shave face and neck.  Do not bring valuables to the hospital.  [facility] is not responsible for any belongings or valuables.  Contacts, dentures or bridgework may not be worn into surgery.  Leave your suitcase in the car.  After surgery it may be brought to your room.  For patients admitted to the hospital, discharge time will be determined by your treatment team.  Patients discharged the day of surgery will not be allowed to drive home.   Special instructions:    [place]- Preparing For Surgery  Before surgery, you can play an important role. Because skin is not sterile, your skin needs to be as free of germs as possible. You can reduce the number of germs on your skin by washing with CHG (chlorahexidine gluconate) Soap before surgery.  CHG is an antiseptic cleaner which kills germs and bonds with the skin to continue killing germs even after washing.  Please do not use if you have an allergy to CHG or antibacterial soaps. If your skin becomes reddened/irritated stop using  the CHG.  Do not shave (including legs and underarms) for at least 48 hours prior to first CHG shower. It is OK to shave your face.  Please follow these instructions carefully.   1. Shower the NIGHT BEFORE SURGERY and the MORNING OF SURGERY with CHG.   2. If you chose to wash your hair, wash your hair first as usual with your normal shampoo.  3. After you shampoo, rinse your hair and body thoroughly to remove the shampoo.  4. Use CHG as you would any other liquid soap. You can apply CHG directly to the skin and wash gently with a scrungie or a clean washcloth.   5. Apply the CHG Soap to your body ONLY FROM THE NECK DOWN.  Do not use on open wounds or open sores. Avoid contact with your eyes, ears, mouth and genitals (private parts). Wash Face and genitals (private parts)  with your normal soap.  6. Wash thoroughly, paying special attention to the area where your surgery will be performed.  7. Thoroughly rinse your body with warm water from the neck down.  8. DO NOT shower/wash with your normal soap after using and rinsing off the CHG Soap.  9. Pat yourself dry with a CLEAN TOWEL.  10. Wear CLEAN PAJAMAS to bed the night before surgery, wear comfortable clothes the morning of surgery  11. Place CLEAN SHEETS on your bed the night of your first shower and DO NOT SLEEP WITH PETS.  Day of Surgery: Do not apply any deodorants/lotions. Please wear clean clothes to the hospital/surgery center.      Please read over the following fact sheets that you were given. Coughing and Deep Breathing, Total Joint Packet, MRSA Information and Surgical Site Infection Prevention

## 2017-07-27 ENCOUNTER — Ambulatory Visit (HOSPITAL_COMMUNITY)
Admission: RE | Admit: 2017-07-27 | Discharge: 2017-07-27 | Disposition: A | Discharge status: Home / Self Care | Payer: PPO | Source: Ambulatory Visit | Attending: Internal Medicine | Admitting: Internal Medicine

## 2017-07-27 ENCOUNTER — Encounter (HOSPITAL_COMMUNITY): Payer: Self-pay

## 2017-07-27 DIAGNOSIS — R011 Cardiac murmur, unspecified: Secondary | ICD-10-CM | POA: Diagnosis not present

## 2017-07-27 DIAGNOSIS — Z01818 Encounter for other preprocedural examination: Secondary | ICD-10-CM | POA: Diagnosis not present

## 2017-07-27 NOTE — Progress Notes (Signed)
  Echocardiogram 2D Echocardiogram has been performed.  Christine Christensen 07/27/2017, 4:10 PM

## 2017-07-27 NOTE — Progress Notes (Addendum)
Anesthesia chart review: Patient is a 71 year old female scheduled for left TKA on 08/06/17 by Dr. Vickey Huger.  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  PCP is Dr. Nolene Ebbs with [facility]. He had cleared patient following 05/22/17 office visit; however, he last saw her on 07/18/17 for evaluation of a syncopal episode, felt to be likely vasovagal. EKG did not show any acute changes or concern. He did recommend an echocardiogram and carotid Dopplers. If above tests were unremarkable he felt she could still proceed with knee arthroplasty surgery on 08/06/17. 07/27/17 echo showed mild AS and stable moderate MS. I don't see that the carotid U/S has been done yet.  Meds include aspirin 325 mg, Lexapro, Urivarx herbal supplement. Patient to clarify with surgeon ASA instructions.   BP 137/69   Pulse 71   Temp 36.8 C   Resp 20   Ht 5' 3.5" (1.613 m)   Wt 167 lb (75.8 kg)   SpO2 98%   BMI 29.12 kg/m   EKG 07/18/17 ([facility]): Sinus rhythm, premature supraventricular complexes, anterior septal infarct, age undetermined.  Echo 07/27/17: Study Conclusions - Left ventricle: The cavity size was normal. Wall thickness was   increased in a pattern of mild LVH. Systolic function was normal.   The estimated ejection fraction was in the range of 60% to 65%.   Wall motion was normal; there were no regional wall motion   abnormalities. Doppler parameters are consistent with abnormal   left ventricular relaxation (grade 1 diastolic dysfunction). The   E/e&' ratio is >20, suggesting markedly elevated LV filling   pressure. - Aortic valve: Trileaflet; mildly calcified leaflets. Mild   stenosis. There was no regurgitation. Mean gradient (S): 12 mm   Hg. Peak gradient (S): 23 mm Hg. Valve area (VTI): 1.3 cm^2.   Valve area  (Vmax): 1.42 cm^2. Valve area (Vmean): 1.53 cm^2. - Mitral valve: Moderately calcified annulus. Moderate stenosis.   Trivial regurgitation. Mean gradient (D): 10 mm Hg. Valve area by   pressure half-time: 1.63 cm^2. - Left atrium: Severely dilated. - Atrial septum: Bows from left to right, suggesting elevated LA>RA   pressure. - Inferior vena cava: The vessel was normal in size. The   respirophasic diameter changes were in the normal range (= 50%),   consistent with normal central venous pressure. Impressions: - Compared to a prior study in 2016, the LVEF is unchanged and   there is stable moderate mitral stenosis, elevated LA pressure,   mild aortic stenosis and severe LAE.  Preoperative labs noted. Cr 0.80, glucose 84, AST 15, ALT 10, CBC WNL.   I will plan to follow-up next week with Dr. Jeanie Cooks regarding echo results and inquire about carotid U/S.  George Hugh [facility] Short Stay Center/Anesthesiology Phone [phone] 07/27/2017 5:27 PM  Addendum: I spoke with Janelle at Dr. Santiago Bur office. He will review echo. Also inquired about when carotid U/S would be done. Will await additional input. Lisa at Dr. Ruel Favors office updated.   George Hugh [facility] Short Stay Center/Anesthesiology Phone [phone] 07/30/2017 9:56 AM  Addendum: Dr. Warren Danes did arrange for patient to get a pre-op carotid U/S. I also spoke with Lattie Haw at Dr. Ruel Favors office. Reportedly patient was re-evaluated by Dr. Jeanie Cooks on 08/02/17 and echo and carotid U/S reviewed. She received verbal report that he was clearing patient to proceed with surgery on  Monday as scheduled. Dr. Jeanie Cooks was to complete a new clearance form and then fax to Dr. Jari Favre office will forward to PAT.   Carotid U/S 08/01/17: Impression: Less than 40% ICA stenosis bilaterally.  George Hugh [facility] Short Stay Center/Anesthesiology Phone [phone] 08/03/2017 12:39 PM

## 2017-08-01 ENCOUNTER — Ambulatory Visit (HOSPITAL_COMMUNITY)
Admission: RE | Admit: 2017-08-01 | Discharge: 2017-08-01 | Disposition: A | Discharge status: Home / Self Care | Payer: PPO | Source: Ambulatory Visit | Attending: Vascular Surgery | Admitting: Vascular Surgery

## 2017-08-01 ENCOUNTER — Other Ambulatory Visit (HOSPITAL_COMMUNITY): Payer: Self-pay | Admitting: Internal Medicine

## 2017-08-01 DIAGNOSIS — I639 Cerebral infarction, unspecified: Secondary | ICD-10-CM

## 2017-08-01 DIAGNOSIS — I6789 Other cerebrovascular disease: Secondary | ICD-10-CM | POA: Insufficient documentation

## 2017-08-01 LAB — VAS US CAROTID
LCCADSYS: -75 cm/s
LCCAPDIAS: 23 cm/s
LCCAPSYS: 123 cm/s
LEFT ECA DIAS: -9 cm/s
LEFT VERTEBRAL DIAS: -11 cm/s
LICADDIAS: -32 cm/s
LICAPSYS: 101 cm/s
Left CCA dist dias: -21 cm/s
Left ICA dist sys: -91 cm/s
Left ICA prox dias: 31 cm/s
RIGHT CCA MID DIAS: 16 cm/s
RIGHT ECA DIAS: -7 cm/s
RIGHT VERTEBRAL DIAS: 11 cm/s
Right CCA prox dias: 11 cm/s
Right CCA prox sys: 91 cm/s
Right cca dist sys: -93 cm/s

## 2017-08-02 DIAGNOSIS — F419 Anxiety disorder, unspecified: Secondary | ICD-10-CM | POA: Diagnosis not present

## 2017-08-02 DIAGNOSIS — I6789 Other cerebrovascular disease: Secondary | ICD-10-CM | POA: Diagnosis not present

## 2017-08-02 DIAGNOSIS — Q2529 Other atresia of aorta: Secondary | ICD-10-CM | POA: Diagnosis not present

## 2017-08-02 DIAGNOSIS — F17210 Nicotine dependence, cigarettes, uncomplicated: Secondary | ICD-10-CM | POA: Diagnosis not present

## 2017-08-02 DIAGNOSIS — Z716 Tobacco abuse counseling: Secondary | ICD-10-CM | POA: Diagnosis not present

## 2017-08-02 DIAGNOSIS — Z1231 Encounter for screening mammogram for malignant neoplasm of breast: Secondary | ICD-10-CM | POA: Diagnosis not present

## 2017-08-02 DIAGNOSIS — M179 Osteoarthritis of knee, unspecified: Secondary | ICD-10-CM | POA: Diagnosis not present

## 2017-08-03 MED ORDER — BUPIVACAINE LIPOSOME 1.3 % IJ SUSP
20.0000 mL | INTRAMUSCULAR | Status: AC
Start: 2017-08-06 — End: 2017-08-06
  Administered 2017-08-06: 20 mL
  Filled 2017-08-03: qty 20

## 2017-08-03 MED ORDER — DEXAMETHASONE SODIUM PHOSPHATE 10 MG/ML IJ SOLN
8.0000 mg | INTRAMUSCULAR | Status: AC
  Administered 2017-08-06: 8 mg via INTRAVENOUS
  Filled 2017-08-03: qty 1

## 2017-08-03 MED ORDER — CEFAZOLIN SODIUM-DEXTROSE 2-4 GM/100ML-% IV SOLN
2.0000 g | INTRAVENOUS | Status: AC
  Administered 2017-08-06: 2 g via INTRAVENOUS
  Filled 2017-08-03: qty 100

## 2017-08-03 MED ORDER — ACETAMINOPHEN 500 MG PO TABS
1000.0000 mg | ORAL_TABLET | Freq: Once | ORAL | Status: AC
  Administered 2017-08-06: 1000 mg via ORAL
  Filled 2017-08-03: qty 2

## 2017-08-03 MED ORDER — GABAPENTIN 300 MG PO CAPS
300.0000 mg | ORAL_CAPSULE | Freq: Once | ORAL | Status: AC
  Administered 2017-08-06: 300 mg via ORAL
  Filled 2017-08-03: qty 1

## 2017-08-03 MED ORDER — SODIUM CHLORIDE 0.9 % IV SOLN
1000.0000 mg | INTRAVENOUS | Status: AC
  Administered 2017-08-06: 1000 mg via INTRAVENOUS
  Filled 2017-08-03: qty 1100

## 2017-08-05 NOTE — Anesthesia Preprocedure Evaluation (Addendum)
Anesthesia Evaluation  Patient identified by MRN, date of birth, ID band Patient awake    Reviewed: Allergy & Precautions, H&P , NPO status , Patient's Chart, lab work & pertinent test results  Airway Mallampati: II  TM Distance: >3 FB Neck ROM: Full    Dental no notable dental hx. (+) Teeth Intact, Dental Advisory Given   Pulmonary Current Smoker,    Pulmonary exam normal breath sounds clear to auscultation       Cardiovascular Exercise Tolerance: Good hypertension, Pt. on medications + Valvular Problems/Murmurs AS  Rhythm:Regular Rate:Normal     Neuro/Psych CVA, Residual Symptoms negative psych ROS   GI/Hepatic negative GI ROS, Neg liver ROS,   Endo/Other  negative endocrine ROS  Renal/GU negative Renal ROS  negative genitourinary   Musculoskeletal  (+) Arthritis , Osteoarthritis,    Abdominal   Peds  Hematology negative hematology ROS (+) anemia ,   Anesthesia Other Findings   Reproductive/Obstetrics negative OB ROS                            Anesthesia Physical Anesthesia Plan  ASA: II  Anesthesia Plan: Spinal   Post-op Pain Management:  Regional for Post-op pain   Induction: Intravenous  PONV Risk Score and Plan: 2 and Ondansetron, Dexamethasone and Propofol infusion  Airway Management Planned: Simple Face Mask  Additional Equipment:   Intra-op Plan:   Post-operative Plan:   Informed Consent: I have reviewed the patients History and Physical, chart, labs and discussed the procedure including the risks, benefits and alternatives for the proposed anesthesia with the patient or authorized representative who has indicated his/her understanding and acceptance.   Dental advisory given  Plan Discussed with: CRNA  Anesthesia Plan Comments:         Anesthesia Quick Evaluation

## 2017-08-06 ENCOUNTER — Ambulatory Visit (HOSPITAL_COMMUNITY): Payer: PPO | Admitting: Emergency Medicine

## 2017-08-06 ENCOUNTER — Ambulatory Visit (HOSPITAL_COMMUNITY): Payer: PPO | Admitting: Anesthesiology

## 2017-08-06 ENCOUNTER — Encounter (HOSPITAL_COMMUNITY): Payer: Self-pay

## 2017-08-06 ENCOUNTER — Encounter (HOSPITAL_COMMUNITY)
Admission: RE | Disposition: A | Discharge status: Home / Self Care | Payer: Self-pay | Source: Ambulatory Visit | Attending: Orthopedic Surgery

## 2017-08-06 ENCOUNTER — Inpatient Hospital Stay (HOSPITAL_COMMUNITY)
Admission: RE | Admit: 2017-08-06 | Discharge: 2017-08-08 | DRG: 470 | Disposition: A | Discharge status: Home / Self Care | Payer: PPO | Source: Ambulatory Visit | Attending: Orthopedic Surgery | Admitting: Orthopedic Surgery

## 2017-08-06 DIAGNOSIS — M79662 Pain in left lower leg: Secondary | ICD-10-CM | POA: Diagnosis not present

## 2017-08-06 DIAGNOSIS — Z79899 Other long term (current) drug therapy: Secondary | ICD-10-CM | POA: Diagnosis not present

## 2017-08-06 DIAGNOSIS — M1712 Unilateral primary osteoarthritis, left knee: Principal | ICD-10-CM | POA: Diagnosis present

## 2017-08-06 DIAGNOSIS — R2681 Unsteadiness on feet: Secondary | ICD-10-CM | POA: Diagnosis not present

## 2017-08-06 DIAGNOSIS — Z96659 Presence of unspecified artificial knee joint: Secondary | ICD-10-CM

## 2017-08-06 DIAGNOSIS — G8918 Other acute postprocedural pain: Secondary | ICD-10-CM | POA: Diagnosis not present

## 2017-08-06 DIAGNOSIS — I080 Rheumatic disorders of both mitral and aortic valves: Secondary | ICD-10-CM | POA: Diagnosis not present

## 2017-08-06 DIAGNOSIS — I10 Essential (primary) hypertension: Secondary | ICD-10-CM | POA: Diagnosis present

## 2017-08-06 DIAGNOSIS — F17210 Nicotine dependence, cigarettes, uncomplicated: Secondary | ICD-10-CM | POA: Diagnosis not present

## 2017-08-06 DIAGNOSIS — M6281 Muscle weakness (generalized): Secondary | ICD-10-CM | POA: Diagnosis not present

## 2017-08-06 DIAGNOSIS — Z7982 Long term (current) use of aspirin: Secondary | ICD-10-CM | POA: Diagnosis not present

## 2017-08-06 DIAGNOSIS — R278 Other lack of coordination: Secondary | ICD-10-CM | POA: Diagnosis not present

## 2017-08-06 DIAGNOSIS — M1990 Unspecified osteoarthritis, unspecified site: Secondary | ICD-10-CM | POA: Diagnosis not present

## 2017-08-06 DIAGNOSIS — R41841 Cognitive communication deficit: Secondary | ICD-10-CM | POA: Diagnosis not present

## 2017-08-06 DIAGNOSIS — Z8673 Personal history of transient ischemic attack (TIA), and cerebral infarction without residual deficits: Secondary | ICD-10-CM

## 2017-08-06 DIAGNOSIS — Z9842 Cataract extraction status, left eye: Secondary | ICD-10-CM

## 2017-08-06 DIAGNOSIS — I69318 Other symptoms and signs involving cognitive functions following cerebral infarction: Secondary | ICD-10-CM | POA: Diagnosis not present

## 2017-08-06 DIAGNOSIS — Z9841 Cataract extraction status, right eye: Secondary | ICD-10-CM

## 2017-08-06 DIAGNOSIS — S8990XA Unspecified injury of unspecified lower leg, initial encounter: Secondary | ICD-10-CM | POA: Diagnosis not present

## 2017-08-06 DIAGNOSIS — F329 Major depressive disorder, single episode, unspecified: Secondary | ICD-10-CM | POA: Diagnosis not present

## 2017-08-06 DIAGNOSIS — Z96652 Presence of left artificial knee joint: Secondary | ICD-10-CM | POA: Diagnosis not present

## 2017-08-06 DIAGNOSIS — D649 Anemia, unspecified: Secondary | ICD-10-CM | POA: Diagnosis not present

## 2017-08-06 HISTORY — PX: TOTAL KNEE ARTHROPLASTY: SHX125

## 2017-08-06 SURGERY — ARTHROPLASTY, KNEE, TOTAL
Anesthesia: Spinal | Site: Knee | Laterality: Left

## 2017-08-06 MED ORDER — DEXAMETHASONE SODIUM PHOSPHATE 10 MG/ML IJ SOLN
INTRAMUSCULAR | Status: AC
  Filled 2017-08-06: qty 1

## 2017-08-06 MED ORDER — BISACODYL 5 MG PO TBEC: 5.0000 mg | DELAYED_RELEASE_TABLET | Freq: Every day | ORAL | Status: DC | PRN

## 2017-08-06 MED ORDER — CELECOXIB 200 MG PO CAPS
200.0000 mg | ORAL_CAPSULE | Freq: Two times a day (BID) | ORAL | Status: DC
  Administered 2017-08-06 – 2017-08-08 (×4): 200 mg via ORAL
  Filled 2017-08-06 (×4): qty 1

## 2017-08-06 MED ORDER — BUPIVACAINE HCL (PF) 0.5 % IJ SOLN
INTRAMUSCULAR | Status: AC
  Filled 2017-08-06: qty 30

## 2017-08-06 MED ORDER — LACTATED RINGERS IV SOLN
INTRAVENOUS | Status: DC
  Administered 2017-08-06 (×2): via INTRAVENOUS

## 2017-08-06 MED ORDER — CEFAZOLIN SODIUM-DEXTROSE 1-4 GM/50ML-% IV SOLN
1.0000 g | Freq: Four times a day (QID) | INTRAVENOUS | Status: AC
  Administered 2017-08-06 (×2): 1 g via INTRAVENOUS
  Filled 2017-08-06 (×2): qty 50

## 2017-08-06 MED ORDER — MIDAZOLAM HCL 2 MG/2ML IJ SOLN
INTRAMUSCULAR | Status: AC
Start: 2017-08-06 — End: 2017-08-06
  Administered 2017-08-06: 1 mg via INTRAVENOUS
  Filled 2017-08-06: qty 2

## 2017-08-06 MED ORDER — CHLORHEXIDINE GLUCONATE 4 % EX LIQD: 60.0000 mL | Freq: Once | CUTANEOUS | Status: DC

## 2017-08-06 MED ORDER — DIPHENHYDRAMINE HCL 12.5 MG/5ML PO ELIX: 12.5000 mg | ORAL_SOLUTION | ORAL | Status: DC | PRN

## 2017-08-06 MED ORDER — SODIUM CHLORIDE 0.9 % IR SOLN
Status: DC | PRN
  Administered 2017-08-06: 1000 mL
  Administered 2017-08-06: 3000 mL

## 2017-08-06 MED ORDER — BUPIVACAINE-EPINEPHRINE (PF) 0.5% -1:200000 IJ SOLN
INTRAMUSCULAR | Status: AC
  Filled 2017-08-06: qty 30

## 2017-08-06 MED ORDER — METOCLOPRAMIDE HCL 5 MG/ML IJ SOLN: 5.0000 mg | Freq: Three times a day (TID) | INTRAMUSCULAR | Status: DC | PRN

## 2017-08-06 MED ORDER — FENTANYL CITRATE (PF) 100 MCG/2ML IJ SOLN
INTRAMUSCULAR | Status: AC
  Administered 2017-08-06: 100 ug via INTRAVENOUS
  Filled 2017-08-06: qty 2

## 2017-08-06 MED ORDER — OXYCODONE HCL 5 MG PO TABS: 5.0000 mg | ORAL_TABLET | ORAL | Status: DC | PRN

## 2017-08-06 MED ORDER — SENNOSIDES-DOCUSATE SODIUM 8.6-50 MG PO TABS: 1.0000 | ORAL_TABLET | Freq: Every evening | ORAL | Status: DC | PRN

## 2017-08-06 MED ORDER — ROPIVACAINE HCL 7.5 MG/ML IJ SOLN
INTRAMUSCULAR | Status: DC | PRN
  Administered 2017-08-06: 20 mL via PERINEURAL

## 2017-08-06 MED ORDER — GABAPENTIN 300 MG PO CAPS
300.0000 mg | ORAL_CAPSULE | Freq: Three times a day (TID) | ORAL | Status: DC
  Administered 2017-08-06 – 2017-08-08 (×6): 300 mg via ORAL
  Filled 2017-08-06 (×6): qty 1

## 2017-08-06 MED ORDER — BUPIVACAINE IN DEXTROSE 0.75-8.25 % IT SOLN
INTRATHECAL | Status: DC | PRN
  Administered 2017-08-06: 15 mg via INTRATHECAL

## 2017-08-06 MED ORDER — DEXAMETHASONE SODIUM PHOSPHATE 10 MG/ML IJ SOLN
10.0000 mg | Freq: Once | INTRAMUSCULAR | Status: AC
  Administered 2017-08-07: 10 mg via INTRAVENOUS
  Filled 2017-08-06: qty 1

## 2017-08-06 MED ORDER — ALUM & MAG HYDROXIDE-SIMETH 200-200-20 MG/5ML PO SUSP: 30.0000 mL | ORAL | Status: DC | PRN

## 2017-08-06 MED ORDER — ACETAMINOPHEN 325 MG PO TABS: 650.0000 mg | ORAL_TABLET | ORAL | Status: DC | PRN

## 2017-08-06 MED ORDER — TRANEXAMIC ACID 1000 MG/10ML IV SOLN
1000.0000 mg | Freq: Once | INTRAVENOUS | Status: AC
  Administered 2017-08-06: 1000 mg via INTRAVENOUS
  Filled 2017-08-06: qty 1100

## 2017-08-06 MED ORDER — BUPIVACAINE-EPINEPHRINE (PF) 0.25% -1:200000 IJ SOLN
INTRAMUSCULAR | Status: AC
  Filled 2017-08-06: qty 30

## 2017-08-06 MED ORDER — FENTANYL CITRATE (PF) 100 MCG/2ML IJ SOLN
100.0000 ug | Freq: Once | INTRAMUSCULAR | Status: AC
  Administered 2017-08-06: 100 ug via INTRAVENOUS

## 2017-08-06 MED ORDER — BUPIVACAINE HCL (PF) 0.25 % IJ SOLN
INTRAMUSCULAR | Status: AC
  Filled 2017-08-06: qty 30

## 2017-08-06 MED ORDER — ACETAMINOPHEN 650 MG RE SUPP: 650.0000 mg | RECTAL | Status: DC | PRN

## 2017-08-06 MED ORDER — MENTHOL 3 MG MT LOZG: 1.0000 | LOZENGE | OROMUCOSAL | Status: DC | PRN

## 2017-08-06 MED ORDER — DOCUSATE SODIUM 100 MG PO CAPS
100.0000 mg | ORAL_CAPSULE | Freq: Two times a day (BID) | ORAL | Status: DC
  Administered 2017-08-06 – 2017-08-08 (×4): 100 mg via ORAL
  Filled 2017-08-06 (×4): qty 1

## 2017-08-06 MED ORDER — ZOLPIDEM TARTRATE 5 MG PO TABS: 5.0000 mg | ORAL_TABLET | Freq: Every evening | ORAL | Status: DC | PRN

## 2017-08-06 MED ORDER — METOCLOPRAMIDE HCL 5 MG PO TABS: 5.0000 mg | ORAL_TABLET | Freq: Three times a day (TID) | ORAL | Status: DC | PRN

## 2017-08-06 MED ORDER — PROPOFOL 500 MG/50ML IV EMUL
INTRAVENOUS | Status: DC | PRN
  Administered 2017-08-06: 50 ug/kg/min via INTRAVENOUS

## 2017-08-06 MED ORDER — DEXTROSE 5 % IV SOLN
500.0000 mg | Freq: Four times a day (QID) | INTRAVENOUS | Status: DC | PRN
  Filled 2017-08-06: qty 5

## 2017-08-06 MED ORDER — MIDAZOLAM HCL 2 MG/2ML IJ SOLN
1.0000 mg | Freq: Once | INTRAMUSCULAR | Status: AC
  Administered 2017-08-06: 1 mg via INTRAVENOUS

## 2017-08-06 MED ORDER — ONDANSETRON HCL 4 MG/2ML IJ SOLN: 4.0000 mg | Freq: Four times a day (QID) | INTRAMUSCULAR | Status: DC | PRN

## 2017-08-06 MED ORDER — ASPIRIN EC 325 MG PO TBEC
325.0000 mg | DELAYED_RELEASE_TABLET | Freq: Two times a day (BID) | ORAL | Status: DC
Start: 2017-08-06 — End: 2017-08-08
  Administered 2017-08-06 – 2017-08-08 (×4): 325 mg via ORAL
  Filled 2017-08-06 (×4): qty 1

## 2017-08-06 MED ORDER — ONDANSETRON HCL 4 MG/2ML IJ SOLN
INTRAMUSCULAR | Status: AC
  Filled 2017-08-06: qty 2

## 2017-08-06 MED ORDER — BUPIVACAINE-EPINEPHRINE (PF) 0.25% -1:200000 IJ SOLN
INTRAMUSCULAR | Status: DC | PRN
  Administered 2017-08-06: 30 mL via PERINEURAL

## 2017-08-06 MED ORDER — ONDANSETRON HCL 4 MG/2ML IJ SOLN
INTRAMUSCULAR | Status: DC | PRN
  Administered 2017-08-06: 4 mg via INTRAVENOUS

## 2017-08-06 MED ORDER — ONDANSETRON HCL 4 MG PO TABS: 4.0000 mg | ORAL_TABLET | Freq: Four times a day (QID) | ORAL | Status: DC | PRN

## 2017-08-06 MED ORDER — MIDAZOLAM HCL 2 MG/2ML IJ SOLN
INTRAMUSCULAR | Status: AC
  Filled 2017-08-06: qty 2

## 2017-08-06 MED ORDER — PHENYLEPHRINE HCL 10 MG/ML IJ SOLN
INTRAMUSCULAR | Status: DC | PRN
  Administered 2017-08-06: 25 ug/min via INTRAVENOUS

## 2017-08-06 MED ORDER — METHOCARBAMOL 500 MG PO TABS: 500.0000 mg | ORAL_TABLET | Freq: Four times a day (QID) | ORAL | Status: DC | PRN

## 2017-08-06 MED ORDER — FENTANYL CITRATE (PF) 250 MCG/5ML IJ SOLN
INTRAMUSCULAR | Status: AC
  Filled 2017-08-06: qty 5

## 2017-08-06 MED ORDER — PHENOL 1.4 % MT LIQD: 1.0000 | OROMUCOSAL | Status: DC | PRN

## 2017-08-06 MED ORDER — FLEET ENEMA 7-19 GM/118ML RE ENEM: 1.0000 | ENEMA | Freq: Once | RECTAL | Status: DC | PRN

## 2017-08-06 MED ORDER — HYDROCODONE-ACETAMINOPHEN 7.5-325 MG PO TABS
1.0000 | ORAL_TABLET | Freq: Four times a day (QID) | ORAL | Status: DC
  Administered 2017-08-06 – 2017-08-08 (×7): 1 via ORAL
  Filled 2017-08-06 (×7): qty 1

## 2017-08-06 MED ORDER — HYDROMORPHONE HCL 1 MG/ML IJ SOLN: 1.0000 mg | INTRAMUSCULAR | Status: DC | PRN

## 2017-08-06 MED ORDER — ESCITALOPRAM OXALATE 10 MG PO TABS
20.0000 mg | ORAL_TABLET | Freq: Every day | ORAL | Status: DC
  Administered 2017-08-07 – 2017-08-08 (×2): 20 mg via ORAL
  Filled 2017-08-06 (×2): qty 2

## 2017-08-06 MED ORDER — SODIUM CHLORIDE 0.9 % IJ SOLN
INTRAMUSCULAR | Status: DC | PRN
  Administered 2017-08-06: 20 mL via INTRAVENOUS

## 2017-08-06 MED ORDER — HYDROMORPHONE HCL 1 MG/ML IJ SOLN: 0.2500 mg | INTRAMUSCULAR | Status: DC | PRN

## 2017-08-06 SURGICAL SUPPLY — 62 items
BANDAGE ACE 6X5 VEL STRL LF (GAUZE/BANDAGES/DRESSINGS) ×3 IMPLANT
BANDAGE ESMARK 6X9 LF (GAUZE/BANDAGES/DRESSINGS) ×1 IMPLANT
BLADE SAGITTAL 13X1.27X60 (BLADE) ×2 IMPLANT
BLADE SAGITTAL 13X1.27X60MM (BLADE) ×1
BLADE SAW SGTL 83.5X18.5 (BLADE) ×3 IMPLANT
BLADE SURG 10 STRL SS (BLADE) ×3 IMPLANT
BNDG ESMARK 6X9 LF (GAUZE/BANDAGES/DRESSINGS) ×3
BOWL SMART MIX CTS (DISPOSABLE) ×3 IMPLANT
CAPT KNEE TOTAL 3 ×3 IMPLANT
CEMENT BONE SIMPLEX SPEEDSET (Cement) ×6 IMPLANT
CLOSURE STERI-STRIP 1/2X4 (GAUZE/BANDAGES/DRESSINGS) ×1
CLOSURE WOUND 1/2 X4 (GAUZE/BANDAGES/DRESSINGS) ×1
CLSR STERI-STRIP ANTIMIC 1/2X4 (GAUZE/BANDAGES/DRESSINGS) ×2 IMPLANT
COVER SURGICAL LIGHT HANDLE (MISCELLANEOUS) ×3 IMPLANT
CUFF TOURNIQUET SINGLE 34IN LL (TOURNIQUET CUFF) ×3 IMPLANT
DECANTER SPIKE VIAL GLASS SM (MISCELLANEOUS) ×3 IMPLANT
DRAPE EXTREMITY T 121X128X90 (DRAPE) ×3 IMPLANT
DRAPE HALF SHEET 40X57 (DRAPES) ×3 IMPLANT
DRAPE INCISE IOBAN 66X45 STRL (DRAPES) ×6 IMPLANT
DRAPE U-SHAPE 47X51 STRL (DRAPES) ×3 IMPLANT
DRSG AQUACEL AG ADV 3.5X10 (GAUZE/BANDAGES/DRESSINGS) ×3 IMPLANT
DURAPREP 26ML APPLICATOR (WOUND CARE) ×6 IMPLANT
ELECT REM PT RETURN 9FT ADLT (ELECTROSURGICAL) ×3
ELECTRODE REM PT RTRN 9FT ADLT (ELECTROSURGICAL) ×1 IMPLANT
GLOVE BIOGEL M 7.0 STRL (GLOVE) IMPLANT
GLOVE BIOGEL PI IND STRL 7.5 (GLOVE) IMPLANT
GLOVE BIOGEL PI IND STRL 8.5 (GLOVE) ×1 IMPLANT
GLOVE BIOGEL PI INDICATOR 7.5 (GLOVE)
GLOVE BIOGEL PI INDICATOR 8.5 (GLOVE) ×2
GLOVE SURG ORTHO 8.0 STRL STRW (GLOVE) ×6 IMPLANT
GLOVE SURG SS PI 6.5 STRL IVOR (GLOVE) ×6 IMPLANT
GOWN STRL REUS W/ TWL LRG LVL3 (GOWN DISPOSABLE) ×1 IMPLANT
GOWN STRL REUS W/ TWL XL LVL3 (GOWN DISPOSABLE) ×1 IMPLANT
GOWN STRL REUS W/TWL 2XL LVL3 (GOWN DISPOSABLE) ×3 IMPLANT
GOWN STRL REUS W/TWL LRG LVL3 (GOWN DISPOSABLE) ×2
GOWN STRL REUS W/TWL XL LVL3 (GOWN DISPOSABLE) ×2
HANDPIECE INTERPULSE COAX TIP (DISPOSABLE) ×2
HOOD PEEL AWAY FACE SHEILD DIS (HOOD) ×9 IMPLANT
KIT BASIN OR (CUSTOM PROCEDURE TRAY) ×3 IMPLANT
KIT ROOM TURNOVER OR (KITS) ×3 IMPLANT
KNEE CAPITATED TOTAL 3 ×1 IMPLANT
MANIFOLD NEPTUNE II (INSTRUMENTS) ×3 IMPLANT
NEEDLE 22X1 1/2 (OR ONLY) (NEEDLE) ×6 IMPLANT
NS IRRIG 1000ML POUR BTL (IV SOLUTION) ×3 IMPLANT
PACK TOTAL JOINT (CUSTOM PROCEDURE TRAY) ×3 IMPLANT
PAD ARMBOARD 7.5X6 YLW CONV (MISCELLANEOUS) ×3 IMPLANT
SET HNDPC FAN SPRY TIP SCT (DISPOSABLE) ×1 IMPLANT
STRIP CLOSURE SKIN 1/2X4 (GAUZE/BANDAGES/DRESSINGS) ×2 IMPLANT
SUCTION FRAZIER HANDLE 10FR (MISCELLANEOUS)
SUCTION TUBE FRAZIER 10FR DISP (MISCELLANEOUS) IMPLANT
SUT BONE WAX W31G (SUTURE) ×3 IMPLANT
SUT MNCRL AB 3-0 PS2 18 (SUTURE) ×3 IMPLANT
SUT VIC AB 0 CTB1 27 (SUTURE) ×6 IMPLANT
SUT VIC AB 1 CT1 27 (SUTURE) ×4
SUT VIC AB 1 CT1 27XBRD ANBCTR (SUTURE) ×2 IMPLANT
SUT VIC AB 2-0 CT1 27 (SUTURE) ×4
SUT VIC AB 2-0 CT1 TAPERPNT 27 (SUTURE) ×2 IMPLANT
SYR 20CC LL (SYRINGE) ×6 IMPLANT
TOWEL OR 17X24 6PK STRL BLUE (TOWEL DISPOSABLE) ×3 IMPLANT
TOWEL OR 17X26 10 PK STRL BLUE (TOWEL DISPOSABLE) ×3 IMPLANT
TRAY FOLEY CATH SILVER 14FR (SET/KITS/TRAYS/PACK) ×3 IMPLANT
WRAP KNEE MAXI GEL POST OP (GAUZE/BANDAGES/DRESSINGS) ×3 IMPLANT

## 2017-08-06 NOTE — Evaluation (Signed)
Physical Therapy Evaluation Patient Details Name: Christine Christensen MRN: [id number] DOB: [birth year] Today's Date: 08/06/2017   History of Present Illness  Pt is a 71 y.o female s/p elective L TKA. PMH includes HTN, Heart murmur, CVA with R residual symptoms, and R TKA.   Clinical Impression  Pt is s/p surgery above with deficits below. PTA, pt was using cane for ambulation. Upon eval, pt presenting with post op pain and weakness, decreased balance, and R extremity weakness which pt has at baseline secondary to CVA. Pt requiring min to mod A for mobility with RW this session. Reports she is unsure of d/c plan at this time as pt and MD deciding SNF vs. Home. Pt's son lives with pt, however, unable to provide physical assist. At this time, feel pt more appropriate for SNF given deficits and current assist level. Will continue to follow acutely to maximize functional mobility independence and safety and update recommendations as necessary.     Follow Up Recommendations DC plan and follow up therapy as arranged by surgeon;Supervision/Assistance - 24 hour    Equipment Recommendations  None recommended by PT    Recommendations for Other Services       Precautions / Restrictions Precautions Precautions: Knee Precaution Booklet Issued: Yes (comment) Precaution Comments: Reviewed supine ther ex with pt.  Restrictions Weight Bearing Restrictions: Yes LLE Weight Bearing: Weight bearing as tolerated      Mobility  Bed Mobility Overal bed mobility: Needs Assistance Bed Mobility: Supine to Sit     Supine to sit: Min guard     General bed mobility comments: Min guard for safety. Verbal cues for sequencing and use of bed rails and elevated HOB.   Transfers Overall transfer level: Needs assistance Equipment used: Rolling walker (2 wheeled) Transfers: Sit to/from Stand Sit to Stand: Mod assist         General transfer comment: Mod A for lift assist and steadying assist. Verbal cues for  safe hand placement.   Ambulation/Gait Ambulation/Gait assistance: Min assist Ambulation Distance (Feet): 5 Feet Assistive device: Rolling walker (2 wheeled) Gait Pattern/deviations: Step-to pattern;Decreased step length - right;Decreased step length - left;Decreased weight shift to left;Antalgic Gait velocity: Decreased Gait velocity interpretation: Below normal speed for age/gender General Gait Details: Slow, antalgic, unsteady gait. Min A for steadying and verbal cues required for sequencing with RW.   Stairs            Wheelchair Mobility    Modified Rankin (Stroke Patients Only)       Balance Overall balance assessment: Needs assistance Sitting-balance support: No upper extremity supported;Feet supported Sitting balance-Leahy Scale: Good     Standing balance support: Bilateral upper extremity supported;During functional activity Standing balance-Leahy Scale: Poor Standing balance comment: Reliant on UE support                              Pertinent Vitals/Pain Pain Assessment: Faces Faces Pain Scale: Hurts a little bit Pain Location: L knee  Pain Descriptors / Indicators: Grimacing;Operative site guarding Pain Intervention(s): Limited activity within patient's tolerance;Monitored during session;Repositioned    Home Living Family/patient expects to be discharged to:: Unsure Living Arrangements: Children Available Help at Discharge: Family;Available 24 hours/day Type of Home: House Home Access: Ramped entrance     Home Layout: One level Home Equipment: Walker - 2 wheels;Tub bench;Cane - single point Additional Comments: Reports son is at home, however, is not able to physically assist pt secondary  to disability.     Prior Function Level of Independence: Independent with assistive device(s)         Comments: USed cane for ambulation      Hand Dominance   Dominant Hand: Right    Extremity/Trunk Assessment   Upper Extremity  Assessment Upper Extremity Assessment: Defer to OT evaluation    Lower Extremity Assessment Lower Extremity Assessment: RLE deficits/detail;LLE deficits/detail RLE Deficits / Details: WEakness at baseline. R foot with internal rotation at baseline, however, pt reports she does not wear brace.  LLE Deficits / Details: Able to perform ther ex below. Sensory in tact. Deficits consistent with post op pain and weakness.     Cervical / Trunk Assessment Cervical / Trunk Assessment: Kyphotic  Communication   Communication: No difficulties  Cognition Arousal/Alertness: Awake/alert Behavior During Therapy: WFL for tasks assessed/performed Overall Cognitive Status: History of cognitive impairments - at baseline                                 General Comments: CVA at baseline       General Comments General comments (skin integrity, edema, etc.): Pt unsure if she will be going home vs. SNF. Original plan was SNF, however, reports MD would like to try for home. Will follow and update recommendations.     Exercises Total Joint Exercises Ankle Circles/Pumps: AROM;Both;20 reps Quad Sets: AROM;Left;10 reps Towel Squeeze: AROM;Both;10 reps Heel Slides: AROM;Left;10 reps Hip ABduction/ADduction: AROM;Left;10 reps   Assessment/Plan    PT Assessment Patient needs continued PT services  PT Problem List Decreased strength;Decreased range of motion;Decreased balance;Decreased mobility;Decreased cognition;Decreased knowledge of use of DME;Decreased safety awareness;Decreased knowledge of precautions;Pain       PT Treatment Interventions DME instruction;Gait training;Functional mobility training;Therapeutic activities;Therapeutic exercise;Balance training;Neuromuscular re-education;Patient/family education    PT Goals (Current goals can be found in the Care Plan section)  Acute Rehab PT Goals Patient Stated Goal: to get better  PT Goal Formulation: With patient Time For Goal  Achievement: 08/13/17 Potential to Achieve Goals: Good    Frequency 7X/week   Barriers to discharge Other (comment) Son unable to physically assist at home     Co-evaluation               AM-PAC PT "6 Clicks" Daily Activity  Outcome Measure Difficulty turning over in bed (including adjusting bedclothes, sheets and blankets)?: A Little Difficulty moving from lying on back to sitting on the side of the bed? : Unable Difficulty sitting down on and standing up from a chair with arms (e.g., wheelchair, bedside commode, etc,.)?: Unable Help needed moving to and from a bed to chair (including a wheelchair)?: A Little Help needed walking in hospital room?: A Little Help needed climbing 3-5 steps with a railing? : A Lot 6 Click Score: 13    End of Session Equipment Utilized During Treatment: Gait belt Activity Tolerance: Patient tolerated treatment well Patient left: in chair;with call bell/phone within reach;with family/visitor present;with nursing/sitter in room Nurse Communication: Mobility status PT Visit Diagnosis: Unsteadiness on feet (R26.81);Other abnormalities of gait and mobility (R26.89);Pain Pain - Right/Left: Left Pain - part of body: Knee    Time: 0865-7846 PT Time Calculation (min) (ACUTE ONLY): 30 min   Charges:   PT Evaluation $PT Eval Low Complexity: 1 Low PT Treatments $Therapeutic Exercise: 8-22 mins   PT G Codes:        Leighton Ruff, PT, DPT  Acute Rehabilitation  Services  Pager: [phone]   Rudean Hitt 08/06/2017, 4:50 PM

## 2017-08-06 NOTE — Anesthesia Postprocedure Evaluation (Signed)
Anesthesia Post Note  Patient: Christine Christensen  Procedure(s) Performed: LEFT TOTAL KNEE ARTHROPLASTY (Left Knee)     Patient location during evaluation: PACU Anesthesia Type: Spinal and Regional Level of consciousness: awake and alert Pain management: pain level controlled Vital Signs Assessment: post-procedure vital signs reviewed and stable Respiratory status: spontaneous breathing, respiratory function stable and patient connected to nasal cannula oxygen Cardiovascular status: blood pressure returned to baseline and stable Postop Assessment: spinal receding Anesthetic complications: no    Last Vitals:  Vitals:   08/06/17 1215 08/06/17 1230  BP:    Pulse: (!) 56 60  Resp: 18 15  Temp:    SpO2: 99% 98%    Last Pain:  Vitals:   08/06/17 0658  TempSrc: Oral                 Quentez Lober,W. EDMOND

## 2017-08-06 NOTE — Plan of Care (Signed)
  Progressing Education: Knowledge of General Education information will improve 08/06/2017 1835 - Progressing by Rance Muir, RN Health Behavior/Discharge Planning: Ability to manage health-related needs will improve 08/06/2017 1835 - Progressing by Rance Muir, RN Clinical Measurements: Ability to maintain clinical measurements within normal limits will improve 08/06/2017 1835 - Progressing by Rance Muir, RN Will remain free from infection 08/06/2017 1835 - Progressing by Rance Muir, RN Diagnostic test results will improve 08/06/2017 1835 - Progressing by Rance Muir, RN Respiratory complications will improve 08/06/2017 1835 - Progressing by Rance Muir, RN Cardiovascular complication will be avoided 08/06/2017 1835 - Progressing by Rance Muir, RN Activity: Risk for activity intolerance will decrease 08/06/2017 1835 - Progressing by Rance Muir, RN Nutrition: Adequate nutrition will be maintained 08/06/2017 1835 - Progressing by Rance Muir, RN Coping: Level of anxiety will decrease 08/06/2017 1835 - Progressing by Rance Muir, RN Elimination: Will not experience complications related to bowel motility 08/06/2017 1835 - Progressing by Rance Muir, RN Will not experience complications related to urinary retention 08/06/2017 1835 - Progressing by Rance Muir, RN Pain Managment: General experience of comfort will improve 08/06/2017 1835 - Progressing by Rance Muir, RN Safety: Ability to remain free from injury will improve 08/06/2017 1835 - Progressing by Rance Muir, RN Skin Integrity: Risk for impaired skin integrity will decrease 08/06/2017 1835 - Progressing by Rance Muir, RN

## 2017-08-06 NOTE — Anesthesia Procedure Notes (Signed)
Spinal  Patient location during procedure: OR Start time: 08/06/2017 8:26 AM End time: 08/06/2017 8:32 AM Staffing Anesthesiologist: Roderic Palau, MD Performed: anesthesiologist  Preanesthetic Checklist Completed: patient identified, surgical consent, pre-op evaluation, timeout performed, IV checked, risks and benefits discussed and monitors and equipment checked Spinal Block Patient position: sitting Prep: DuraPrep Patient monitoring: cardiac monitor, continuous pulse ox and blood pressure Approach: midline Location: L3-4 Injection technique: single-shot Needle Needle type: Pencan  Needle gauge: 24 G Needle length: 9 cm Assessment Sensory level: T8 Additional Notes Functioning IV was confirmed and monitors were applied. Sterile prep and drape, including hand hygiene and sterile gloves were used. The patient was positioned and the spine was prepped. The skin was anesthetized with lidocaine.  Free flow of clear CSF was obtained prior to injecting local anesthetic into the CSF.  The spinal needle aspirated freely following injection.  The needle was carefully withdrawn.  The patient tolerated the procedure well.

## 2017-08-06 NOTE — Progress Notes (Signed)
Orthopedic Tech Progress Note Patient Details:  Christine Christensen March 25, 1946 [id number]  CPM Left Knee CPM Left Knee: On Left Knee Flexion (Degrees): 90 Left Knee Extension (Degrees): 0   [name] 08/06/2017, 11:31 AM

## 2017-08-06 NOTE — Anesthesia Procedure Notes (Signed)
Anesthesia Regional Block: Adductor canal block   Pre-Anesthetic Checklist: ,, timeout performed, Correct Patient, Correct Site, Correct Laterality, Correct Procedure, Correct Position, site marked, Risks and benefits discussed, pre-op evaluation,  At surgeon's request and post-op pain management  Laterality: Left  Prep: Maximum Sterile Barrier Precautions used, chloraprep       Needles:  Injection technique: Single-shot  Needle Type: Echogenic Stimulator Needle     Needle Length: 9cm  Needle Gauge: 21     Additional Needles:   Procedures:,,,, ultrasound used (permanent image in chart),,,,  Narrative:  Start time: 08/06/2017 8:03 AM End time: 08/06/2017 8:13 AM Injection made incrementally with aspirations every 5 mL.  Performed by: Personally  Anesthesiologist: Roderic Palau, MD  Additional Notes: 2% Lidocaine skin wheel.

## 2017-08-06 NOTE — H&P (Signed)
Christine Christensen MRN:  [id number] DOB/SEX:  [birth year]/female  CHIEF COMPLAINT:  Painful left Knee  HISTORY: Patient is a 71 y.o. female presented with a history of pain in the left knee. Onset of symptoms was gradual starting a few years ago with gradually worsening course since that time. Patient has been treated conservatively with over-the-counter NSAIDs and activity modification. Patient currently rates pain in the knee at 10 out of 10 with activity. There is pain at night.  PAST MEDICAL HISTORY: There are no active problems to display for this patient.  Past Medical History:  Diagnosis Date  . Anemia    history of anemia  . Aortic stenosis    mild aortic stenosis 07/27/17 echo  . Arthritis   . Heart murmur   . Hypertension   . Mitral stenosis    moderate mitral stenosis 07/27/17 echo  . Stroke [facility])    stroke x2, 2005 and 2008, some right sided weakness  . Urinary incontinence    Past Surgical History:  Procedure Laterality Date  . CATARACT EXTRACTION, BILATERAL Bilateral   . CHOLECYSTECTOMY    . TONSILLECTOMY    . TOTAL KNEE ARTHROPLASTY Right    right knee 2006  . TUBAL LIGATION       MEDICATIONS:   Medications Prior to Admission  Medication Sig Dispense Refill Last Dose  . aspirin 325 MG tablet Take 325 mg by mouth daily.   05/29/2016 at Unknown time  . Cholecalciferol (VITAMIN D3 PO) Take 1 capsule daily by mouth.     . escitalopram (LEXAPRO) 20 MG tablet Take 20 mg daily by mouth.    05/29/2016 at Unknown time  . naproxen sodium (ALEVE) 220 MG tablet Take 440 mg 2 (two) times daily as needed by mouth (for pain or headache).     . OVER THE COUNTER MEDICATION Take 1 capsule 2 (two) times daily by mouth. Urivax Supplement     . diclofenac (CATAFLAM) 50 MG tablet Take 1 tablet (50 mg total) by mouth 3 (three) times daily. (Patient not taking: Reported on 07/25/2017) 30 tablet 0 Completed Course at Unknown time    ALLERGIES:  No Known Allergies  REVIEW OF SYSTEMS:  A  comprehensive review of systems was negative except for: Musculoskeletal: positive for arthralgias and bone pain   FAMILY HISTORY:  No family history on file.  SOCIAL HISTORY:   Social History   Tobacco Use  . Smoking status: Current Every Day Smoker    Packs/day: 1.00    Years: 56.00    Pack years: 56.00    Types: Cigarettes  . Smokeless tobacco: Never Used  Substance Use Topics  . Alcohol use: No     EXAMINATION:  Vital signs in last 24 hours:    There were no vitals taken for this visit.  General Appearance:    Alert, cooperative, no distress, appears stated age  Head:    Normocephalic, without obvious abnormality, atraumatic  Eyes:    PERRL, conjunctiva/corneas clear, EOM's intact, fundi    benign, both eyes  Ears:    Normal TM's and external ear canals, both ears  Nose:   Nares normal, septum midline, mucosa normal, no drainage    or sinus tenderness  Throat:   Lips, mucosa, and tongue normal; teeth and gums normal  Neck:   Supple, symmetrical, trachea midline, no adenopathy;    thyroid:  no enlargement/tenderness/nodules; no carotid   bruit or JVD  Back:     Symmetric, no curvature, ROM normal,  no CVA tenderness  Lungs:     Clear to auscultation bilaterally, respirations unlabored  Chest Wall:    No tenderness or deformity   Heart:    Regular rate and rhythm, S1 and S2 normal, no murmur, rub   or gallop  Breast Exam:    No tenderness, masses, or nipple abnormality  Abdomen:     Soft, non-tender, bowel sounds active all four quadrants,    no masses, no organomegaly  Genitalia:    Normal female without lesion, discharge or tenderness  Rectal:    Normal tone, no masses or tenderness;   guaiac negative stool  Extremities:   Extremities normal, atraumatic, no cyanosis or edema  Pulses:   2+ and symmetric all extremities  Skin:   Skin color, texture, turgor normal, no rashes or lesions  Lymph nodes:   Cervical, supraclavicular, and axillary nodes normal  Neurologic:    CNII-XII intact, normal strength, sensation and reflexes    throughout    Musculoskeletal:  ROM 0-120, Ligaments intact,  Imaging Review Plain radiographs demonstrate severe degenerative joint disease of the left knee. The overall alignment is mild varus. The bone quality appears to be excellent for age and reported activity level.  Assessment/Plan: Primary osteoarthritis, left knee   The patient history, physical examination and imaging studies are consistent with advanced degenerative joint disease of the left knee. The patient has failed conservative treatment.  The clearance notes were reviewed.  After discussion with the patient it was felt that Total Knee Replacement was indicated. The procedure,  risks, and benefits of total knee arthroplasty were presented and reviewed. The risks including but not limited to aseptic loosening, infection, blood clots, vascular injury, stiffness, patella tracking problems complications among others were discussed. The patient acknowledged the explanation, agreed to proceed with the plan.  Donia Ast 08/06/2017, 6:59 AM

## 2017-08-06 NOTE — Anesthesia Procedure Notes (Signed)
Procedure Name: MAC Date/Time: 08/06/2017 8:33 AM Performed by: Colin Benton, CRNA Pre-anesthesia Checklist: Patient identified, Emergency Drugs available, Suction available and Patient being monitored Patient Re-evaluated:Patient Re-evaluated prior to induction Oxygen Delivery Method: Simple face mask

## 2017-08-06 NOTE — Transfer of Care (Signed)
Immediate Anesthesia Transfer of Care Note  Patient: Christine Christensen  Procedure(s) Performed: LEFT TOTAL KNEE ARTHROPLASTY (Left Knee)  Patient Location: PACU  Anesthesia Type:Regional and Spinal  Level of Consciousness: awake, alert  and oriented  Airway & Oxygen Therapy: Patient Spontanous Breathing and Patient connected to face mask oxygen  Post-op Assessment: Report given to RN and Post -op Vital signs reviewed and stable  Post vital signs: Reviewed and stable  Last Vitals:  Vitals:   08/06/17 0815 08/06/17 1033  BP: (!) 138/59   Pulse: 63   Resp: 12 (P) 12  Temp:  (!) (P) 36.2 C  SpO2: 99%     Last Pain:  Vitals:   08/06/17 0658  TempSrc: Oral         Complications: No apparent anesthesia complications

## 2017-08-07 LAB — BASIC METABOLIC PANEL
ANION GAP: 7 (ref 5–15)
BUN: 8 mg/dL (ref 6–20)
CALCIUM: 9.1 mg/dL (ref 8.9–10.3)
CO2: 28 mmol/L (ref 22–32)
CREATININE: 0.68 mg/dL (ref 0.44–1.00)
Chloride: 102 mmol/L (ref 101–111)
Glucose, Bld: 97 mg/dL (ref 65–99)
Potassium: 3.7 mmol/L (ref 3.5–5.1)
Sodium: 137 mmol/L (ref 135–145)

## 2017-08-07 LAB — CBC
HCT: 35.1 % — ABNORMAL LOW (ref 36.0–46.0)
HEMOGLOBIN: 11.5 g/dL — AB (ref 12.0–15.0)
MCH: 29 pg (ref 26.0–34.0)
MCHC: 32.8 g/dL (ref 30.0–36.0)
MCV: 88.4 fL (ref 78.0–100.0)
PLATELETS: 196 10*3/uL (ref 150–400)
RBC: 3.97 MIL/uL (ref 3.87–5.11)
RDW: 15.4 % (ref 11.5–15.5)
WBC: 10.4 10*3/uL (ref 4.0–10.5)

## 2017-08-07 MED ORDER — OXYCODONE HCL 5 MG PO TABS: 5.0000 mg | ORAL_TABLET | ORAL | 0 refills | Status: DC | PRN

## 2017-08-07 MED ORDER — METHOCARBAMOL 500 MG PO TABS: 500.0000 mg | ORAL_TABLET | Freq: Four times a day (QID) | ORAL | 0 refills | Status: DC | PRN

## 2017-08-07 MED ORDER — ASPIRIN 325 MG PO TBEC: 325.0000 mg | DELAYED_RELEASE_TABLET | Freq: Two times a day (BID) | ORAL | 0 refills | Status: DC

## 2017-08-07 NOTE — NC FL2 (Signed)
[place] MEDICAID FL2 LEVEL OF CARE SCREENING TOOL     IDENTIFICATION  Patient Name: Christine Christensen Birthdate: 05/05/1946 Sex: female Admission Date (Current Location): 08/06/2017  [facility] and [place] Number:  Herbalist and Address:  [facility] [place], [place] [address] [place], [place] [address]      Provider Number: [id number]  Attending Physician Name and Address:  Vickey Huger, MD  Relative Name and Phone Number:   Darcella Gasman, [phone], aunt    Current Level of Care: Hospital Recommended Level of Care: [place] Prior Approval Number:    Date Approved/Denied:   PASRR Number: [id number]  Discharge Plan: SNF    Current Diagnoses: Patient Active Problem List   Diagnosis Date Noted  . S/P total knee replacement 08/06/2017    Orientation RESPIRATION BLADDER Height & Weight     Self, Time, Situation, Place  Normal Incontinent, External catheter Weight: 167 lb (75.8 kg) Height:     BEHAVIORAL SYMPTOMS/MOOD NEUROLOGICAL BOWEL NUTRITION STATUS      Continent Diet(See DC Summary)  AMBULATORY STATUS COMMUNICATION OF NEEDS Skin   Limited Assist Verbally Surgical wounds                       Personal Care Assistance Level of Assistance  Bathing, Feeding, Dressing Bathing Assistance: Limited assistance Feeding assistance: Independent Dressing Assistance: Limited assistance     Functional Limitations Info  Sight, Hearing, Speech Sight Info: Adequate Hearing Info: Adequate Speech Info: Adequate    SPECIAL CARE FACTORS FREQUENCY  PT (By licensed PT), OT (By licensed OT)     PT Frequency: 7x week OT Frequency: 2x week            Contractures Contractures Info: Not present    Additional Factors Info  Code Status, Allergies, Psychotropic Code Status Info: Full code Allergies Info: No Known Allergies Psychotropic Info: Lexapro         Current Medications (08/07/2017):  This is the current  hospital active medication list Current Facility-Administered Medications  Medication Dose Route Frequency Provider Last Rate Last Dose  . acetaminophen (TYLENOL) tablet 650 mg  650 mg Oral Q4H PRN Donia Ast, [place]       Or  . acetaminophen (TYLENOL) suppository 650 mg  650 mg Rectal Q4H PRN Donia Ast, PA      . alum & mag hydroxide-simeth (MAALOX/MYLANTA) 200-200-20 MG/5ML suspension 30 mL  30 mL Oral Q4H PRN Donia Ast, [place]      . aspirin EC tablet 325 mg  325 mg Oral BID Donia Ast, [place]   325 mg at 08/07/17 0901  . bisacodyl (DULCOLAX) EC tablet 5 mg  5 mg Oral Daily PRN Donia Ast, [place]      . celecoxib (CELEBREX) capsule 200 mg  200 mg Oral Q12H Donia Ast, [place]   200 mg at 08/07/17 9735  . diphenhydrAMINE (BENADRYL) 12.5 MG/5ML elixir 12.5-25 mg  12.5-25 mg Oral Q4H PRN Donia Ast, [place]      . docusate sodium (COLACE) capsule 100 mg  100 mg Oral BID Donia Ast, [place]   100 mg at 08/07/17 3299  . escitalopram (LEXAPRO) tablet 20 mg  20 mg Oral Daily Donia Ast, [place]   20 mg at 08/07/17 2426  . gabapentin (NEURONTIN) capsule 300 mg  300 mg Oral TID Donia Ast, [place]   300 mg at 08/07/17 8341  . HYDROcodone-acetaminophen ([place])  7.5-325 MG per tablet 1 tablet  1 tablet Oral Q6H Donia Ast, [place]   1 tablet at 08/07/17 1212  . HYDROmorphone (DILAUDID) injection 1 mg  1 mg Intravenous Q2H PRN Donia Ast, [place]      . menthol-cetylpyridinium (CEPACOL) lozenge 3 mg  1 lozenge Oral PRN Donia Ast, PA       Or  . phenol (CHLORASEPTIC) mouth spray 1 spray  1 spray Mouth/Throat PRN Donia Ast, [place]      . methocarbamol (ROBAXIN) tablet 500 mg  500 mg Oral Q6H PRN Donia Ast, PA       Or  . methocarbamol (ROBAXIN) 500 mg in dextrose 5 % 50 mL IVPB  500 mg Intravenous Q6H PRN Donia Ast, PA      . metoCLOPramide (REGLAN) tablet 5-10 mg  5-10 mg Oral Q8H PRN Donia Ast, [place]        Or  . metoCLOPramide (REGLAN) injection 5-10 mg  5-10 mg Intravenous Q8H PRN Donia Ast, [place]      . ondansetron [facility]) tablet 4 mg  4 mg Oral Q6H PRN Donia Ast, PA       Or  . ondansetron [facility]) injection 4 mg  4 mg Intravenous Q6H PRN Donia Ast, [place]      . oxyCODONE (Oxy IR/ROXICODONE) immediate release tablet 5 mg  5 mg Oral Q3H PRN Donia Ast, PA      . senna-docusate (Senokot-S) tablet 1 tablet  1 tablet Oral QHS PRN Donia Ast, PA      . sodium phosphate (FLEET) 7-19 GM/118ML enema 1 enema  1 enema Rectal Once PRN Donia Ast, PA      . zolpidem (AMBIEN) tablet 5 mg  5 mg Oral QHS PRN Donia Ast, [place]         Discharge Medications: Please see discharge summary for a list of discharge medications.  Relevant Imaging Results:  Relevant Lab Results:   Additional Information SS#:[id number] [place], LCSW

## 2017-08-07 NOTE — Evaluation (Signed)
Occupational Therapy Evaluation Patient Details Name: Christine Christensen MRN: [id number] DOB: 1945-09-21 Today's Date: 08/07/2017    History of Present Illness Pt is a 71 y.o female s/p elective L TKA. PMH includes HTN, Heart murmur, CVA with R residual symptoms, and R TKA.    Clinical Impression   Pt with decline in function and safety with ADLs and ADL mobility with decreased strength, balance and endurance. Pt would benefit from acute OT services to maximize level of function and safety    Follow Up Recommendations  SNF    Equipment Recommendations  None recommended by OT;Other (comment)(TBD at next venue of care)    Recommendations for Other Services       Precautions / Restrictions Precautions Precautions: Knee Precaution Comments: Reviewed no pillow under knee Restrictions Weight Bearing Restrictions: Yes LLE Weight Bearing: Weight bearing as tolerated      Mobility Bed Mobility Overal bed mobility: Needs Assistance Bed Mobility: Supine to Sit     Supine to sit: Min guard     General bed mobility comments: Min guard for safety. Verbal cues for sequencing and use of bed rails and elevated HOB.   Transfers Overall transfer level: Needs assistance Equipment used: Rolling walker (2 wheeled) Transfers: Sit to/from Stand Sit to Stand: Mod assist;Min assist         General transfer comment: Mod A for lift assist and steadying assist. Verbal cues for safe hand placement.     Balance   Sitting-balance support: No upper extremity supported;Feet supported Sitting balance-Leahy Scale: Good     Standing balance support: Bilateral upper extremity supported;During functional activity Standing balance-Leahy Scale: Poor                             ADL either performed or assessed with clinical judgement   ADL   Eating/Feeding: Independent;Sitting   Grooming: Wash/dry hands;Wash/dry face;Standing;Minimal assistance   Upper Body Bathing: Supervision/  safety;Set up;Sitting   Lower Body Bathing: Moderate assistance   Upper Body Dressing : Supervision/safety;Set up;Sitting   Lower Body Dressing: Moderate assistance   Toilet Transfer: Minimal assistance;RW;Ambulation;Cueing for safety Toilet Transfer Details (indicate cue type and reason): simulated to recliner Toileting- Clothing Manipulation and Hygiene: Moderate assistance;Sit to/from stand;Cueing for safety       Functional mobility during ADLs: Moderate assistance;Minimal assistance;Rolling walker;Cueing for safety       Vision Baseline Vision/History: Wears glasses Patient Visual Report: No change from baseline       Perception     Praxis      Pertinent Vitals/Pain Pain Assessment: 0-10 Pain Score: 3  Pain Location: L knee  Pain Descriptors / Indicators: Grimacing;Operative site guarding Pain Intervention(s): Limited activity within patient's tolerance;Monitored during session;Premedicated before session;Repositioned     Hand Dominance Right   Extremity/Trunk Assessment Upper Extremity Assessment Upper Extremity Assessment: Generalized weakness   Lower Extremity Assessment Lower Extremity Assessment: Defer to PT evaluation   Cervical / Trunk Assessment Cervical / Trunk Assessment: Kyphotic   Communication Communication Communication: No difficulties   Cognition Arousal/Alertness: Awake/alert Behavior During Therapy: WFL for tasks assessed/performed Overall Cognitive Status: History of cognitive impairments - at baseline                                    General Comments   pt very pleasant and cooperative    Exercises     Shoulder  Instructions      Home Living Family/patient expects to be discharged to:: Skilled nursing facility Living Arrangements: Children Available Help at Discharge: Family;Available 24 hours/day Type of Home: House Home Access: Ramped entrance     Home Layout: One level     Bathroom Shower/Tub:  Teacher, early years/pre: Handicapped height     Home Equipment: Environmental consultant - 2 wheels;Tub bench;Cane - single point   Additional Comments: Reports son is at home, however, is not able to physically assist pt secondary to disability.       Prior Functioning/Environment Level of Independence: Independent with assistive device(s)        Comments: USed cane for ambulation         OT Problem List: Decreased strength;Decreased activity tolerance;Impaired balance (sitting and/or standing);Pain      OT Treatment/Interventions: Self-care/ADL training;DME and/or AE instruction;Therapeutic activities;Patient/family education    OT Goals(Current goals can be found in the care plan section) Acute Rehab OT Goals Patient Stated Goal: to get better  OT Goal Formulation: With patient Time For Goal Achievement: 08/14/17 Potential to Achieve Goals: Good ADL Goals Pt Will Perform Grooming: with min assist;with min guard assist;standing Pt Will Perform Upper Body Bathing: with set-up Pt Will Perform Lower Body Bathing: with min assist;sitting/lateral leans;sit to/from stand Pt Will Perform Upper Body Dressing: with set-up Pt Will Transfer to Toilet: with min guard assist;ambulating;bedside commode;grab bars Pt Will Perform Toileting - Clothing Manipulation and hygiene: with min assist  OT Frequency: Min 2X/week   Barriers to D/C: Decreased caregiver support          Co-evaluation              AM-PAC PT "6 Clicks" Daily Activity     Outcome Measure Help from another person eating meals?: None Help from another person taking care of personal grooming?: A Little Help from another person toileting, which includes using toliet, bedpan, or urinal?: A Lot Help from another person bathing (including washing, rinsing, drying)?: A Lot Help from another person to put on and taking off regular upper body clothing?: A Little Help from another person to put on and taking off regular  lower body clothing?: A Lot 6 Click Score: 16   End of Session Equipment Utilized During Treatment: Gait belt;Rolling walker CPM Left Knee CPM Left Knee: Off  Activity Tolerance: Patient tolerated treatment well Patient left: in chair;with call bell/phone within reach  OT Visit Diagnosis: Unsteadiness on feet (R26.81);Other abnormalities of gait and mobility (R26.89);Muscle weakness (generalized) (M62.81);Pain Pain - Right/Left: Left Pain - part of body: Knee                Time: 1093-2355 OT Time Calculation (min): 27 min Charges:  OT General Charges $OT Visit: 1 Visit OT Evaluation $OT Eval Low Complexity: 1 Low OT Treatments $Therapeutic Activity: 8-22 mins G-Codes: OT G-codes **NOT FOR INPATIENT CLASS** Functional Assessment Tool Used: AM-PAC 6 Clicks Daily Activity     Britt Bottom 08/07/2017, 1:37 PM

## 2017-08-07 NOTE — Progress Notes (Signed)
SPORTS MEDICINE AND JOINT REPLACEMENT  Lara Mulch, MD    [name], PA-C [place], [place], [place]  [address]                             [phone]   PROGRESS NOTE  Subjective:  negative for Chest Pain  negative for Shortness of Breath  negative for Nausea/Vomiting   negative for Calf Pain  negative for Bowel Movement   Tolerating Diet: yes         Patient reports pain as 4 on 0-10 scale.    Objective: Vital signs in last 24 hours:    Patient Vitals for the past 24 hrs:  BP Temp Temp src Pulse Resp SpO2  08/07/17 0358 126/66 98.2 F (36.8 C) Oral 65 20 96 %  08/07/17 0100 (!) 130/52 97.9 F (36.6 C) Oral 77 20 97 %  08/06/17 2003 (!) 138/51 97.7 F (36.5 C) Oral 75 18 96 %  08/06/17 1433 - - - - - 98 %  08/06/17 1404 107/61 - - 61 11 98 %  08/06/17 1400 - 97.8 F (36.6 C) - 60 12 98 %  08/06/17 1349 (!) 123/58 - - 62 (!) 9 100 %  08/06/17 1345 - - - (!) 59 11 100 %  08/06/17 1334 130/70 - - 74 11 98 %  08/06/17 1330 - - - 76 11 99 %  08/06/17 1319 (!) 147/63 - - 69 11 99 %  08/06/17 1315 - - - (!) 59 11 100 %  08/06/17 1300 - - - (!) 56 10 99 %  08/06/17 1248 132/61 - - (!) 58 14 99 %  08/06/17 1245 - - - (!) 57 13 99 %  08/06/17 1230 - - - 60 15 98 %  08/06/17 1215 - - - (!) 56 18 99 %  08/06/17 1204 132/67 - - (!) 59 11 99 %  08/06/17 1200 - - - (!) 56 20 98 %  08/06/17 1149 130/68 - - (!) 55 10 98 %  08/06/17 1145 - - - (!) 55 11 99 %  08/06/17 1130 - - - (!) 55 15 99 %  08/06/17 1119 (!) 122/57 - - (!) 54 11 99 %  08/06/17 1115 - - - (!) 53 13 99 %  08/06/17 1104 125/62 - - (!) 56 10 100 %  08/06/17 1100 - - - (!) 54 10 100 %  08/06/17 1049 (!) 123/54 - - (!) 56 17 97 %  08/06/17 1045 - - - (!) 54 11 100 %  08/06/17 1033 109/61 (!) 97.2 F (36.2 C) - 64 12 96 %  08/06/17 0815 (!) 138/59 - - 63 12 99 %  08/06/17 0810 (!) 191/76 - - 64 15 100 %  08/06/17 0805 - - - 69 14 100 %  08/06/17 0800 - - - 71 15 100 %     @flow {1959:LAST@   Intake/Output from previous day:   11/19 0701 - 11/20 0700 In: 1360 [P.O.:360; I.V.:1000] Out: 2420 [Urine:2400]   Intake/Output this shift:   No intake/output data recorded.   Intake/Output      11/19 0701 - 11/20 0700 11/20 0701 - 11/21 0700   P.O. 360    I.V. (mL/kg) 1000 (13.2)    Total Intake(mL/kg) 1360 (18)    Urine (mL/kg/hr) 2400 (1.3)    Blood 20    Total Output 2420    Net -  [place]: Recent Labs    08/07/17 0506  WBC 10.4  HGB 11.5*  HCT 35.1*  PLT 196   Recent Labs    08/07/17 0506  NA 137  K 3.7  CL 102  CO2 28  BUN 8  CREATININE 0.68  GLUCOSE 97  CALCIUM 9.1   No results found for: INR, PROTIME  Examination:  General appearance: alert, cooperative and no distress Extremities: extremities normal, atraumatic, no cyanosis or edema  Wound Exam: clean, dry, intact   Drainage:  None: wound tissue dry  Motor Exam: Quadriceps and Hamstrings Intact  Sensory Exam: Superficial Peroneal, Deep Peroneal and Tibial normal   Assessment:    1 Day Post-Op  Procedure(s) (LRB): LEFT TOTAL KNEE ARTHROPLASTY (Left)  ADDITIONAL DIAGNOSIS:  Active Problems:   S/P total knee replacement     Plan: Physical Therapy as ordered Weight Bearing as Tolerated (WBAT)  DVT Prophylaxis:  Aspirin  DISCHARGE PLAN: Skilled Nursing Facility/Rehab  DISCHARGE NEEDS: HHPT   Patient doing well, will acutely follow in Pt. Probable D/C to SNF tomorrow         Donia Ast 08/07/2017, 7:08 AM

## 2017-08-07 NOTE — Care Management Note (Signed)
Case Management Note  Patient Details  Name: Christine Christensen MRN: [id number] Date of Birth: [birth year]  Subjective/Objective:                 Pt is a 71 y.o female s/p elective L TKA. Anticipate Dc to SNF, CSW following.    Action/Plan:   Expected Discharge Date:                  Expected Discharge Plan:  Skilled Nursing Facility  In-House Referral:  Clinical Social Work  Discharge planning Services  CM Consult  Post Acute Care Choice:    Choice offered to:     DME Arranged:    DME Agency:     HH Arranged:    [place] Agency:     Status of Service:  In process, will continue to follow  If discussed at Long Length of Stay Meetings, dates discussed:    Additional Comments:  Carles Collet, RN 08/07/2017, 4:04 PM

## 2017-08-07 NOTE — Progress Notes (Signed)
Patient standing at sink in bathroom washing her hands with myself standing right behind her. She began to lose her balance and began to fall backwards. I was unable to fully catch her but was able to ease her to the ground. Patient landed gently on her butt and was free from any injuries. Myself and another RN helped get the patient get up off the floor and get back to bed.  [name], PA was notified and patient refused for me to call her family.

## 2017-08-07 NOTE — Progress Notes (Signed)
Orthopedic Tech Progress Note Patient Details:  Christine Christensen 1945/12/29 [id number]  Patient ID: [name], female   DOB: [birth year], 71 y.o.   MRN: [id number]   [name] 08/07/2017, 1:56 PM Placed pt's lle on cpm @0 -60 degrees @1355 ; RN notified

## 2017-08-07 NOTE — Progress Notes (Signed)
Physical Therapy Treatment Patient Details Name: Christine Christensen MRN: [id number] DOB: 1946/02/16 Today's Date: 08/07/2017    History of Present Illness Pt is a 71 y.o female s/p elective L TKA. PMH includes HTN, Heart murmur, CVA with R residual symptoms, and R TKA.     PT Comments    Patient is making progress toward mobility goals. Pt does continue to demonstrate balance and cognitive deficits requiring min/mod A for safe OOB mobility and max cues to complete HEP. Current plan remains appropriate.    Follow Up Recommendations  DC plan and follow up therapy as arranged by surgeon;Supervision/Assistance - 24 hour     Equipment Recommendations  None recommended by PT    Recommendations for Other Services       Precautions / Restrictions Precautions Precautions: Knee Precaution Booklet Issued: Yes (comment) Precaution Comments: precautions/positioning reviewed with pt Restrictions Weight Bearing Restrictions: Yes LLE Weight Bearing: Weight bearing as tolerated    Mobility  Bed Mobility Overal bed mobility: Needs Assistance Bed Mobility: Supine to Sit     Supine to sit: Min guard     General bed mobility comments: min guard for safety  Transfers Overall transfer level: Needs assistance Equipment used: Rolling walker (2 wheeled) Transfers: Sit to/from Stand Sit to Stand: Min assist         General transfer comment: assist to power up into standing; cues for safe hand placement  Ambulation/Gait Ambulation/Gait assistance: Min assist;Mod assist Ambulation Distance (Feet): 75 Feet Assistive device: Rolling walker (2 wheeled) Gait Pattern/deviations: Step-to pattern;Decreased step length - right;Decreased weight shift to left;Antalgic;Decreased stance time - left Gait velocity: Decreased   General Gait Details: pt required min/mod A for balance when ambulating; cues for posture, sequencing, and L heel strike/toe off: pt with R knee hyperextension with increased  gait distance    Stairs            Wheelchair Mobility    Modified Rankin (Stroke Patients Only)       Balance Overall balance assessment: Needs assistance Sitting-balance support: No upper extremity supported;Feet supported Sitting balance-Leahy Scale: Good     Standing balance support: Bilateral upper extremity supported;During functional activity Standing balance-Leahy Scale: Poor Standing balance comment: Reliant on UE support                             Cognition Arousal/Alertness: Awake/alert Behavior During Therapy: WFL for tasks assessed/performed Overall Cognitive Status: History of cognitive impairments - at baseline Area of Impairment: Attention;Memory;Following commands;Problem solving                   Current Attention Level: Selective Memory: Decreased short-term memory;Decreased recall of precautions Following Commands: Follows one step commands with increased time     Problem Solving: Decreased initiation;Requires verbal cues General Comments: CVA at baseline       Exercises Total Joint Exercises Quad Sets: AROM;Left;10 reps Short Arc Quad: AROM;Left;10 reps Heel Slides: AROM;Left;10 reps Hip ABduction/ADduction: AROM;Left;10 reps Straight Leg Raises: AROM;Left;10 reps Goniometric ROM: approx 85 degree flexion    General Comments        Pertinent Vitals/Pain Pain Assessment: Faces Pain Score: 3  Faces Pain Scale: Hurts little more Pain Location: L knee  Pain Descriptors / Indicators: Guarding;Sore Pain Intervention(s): Limited activity within patient's tolerance;Monitored during session;Repositioned    Home Living Family/patient expects to be discharged to:: Skilled nursing facility Living Arrangements: Children Available Help at Discharge: Family;Available 24 hours/day Type  of Home: House Home Access: [place]: One level Home Equipment: [place] - 2 wheels;Tub bench;Cane - single  point Additional Comments: Reports son is at home, however, is not able to physically assist pt secondary to disability.     Prior Function Level of Independence: Independent with assistive device(s)      Comments: USed cane for ambulation    PT Goals (current goals can now be found in the care plan section) Acute Rehab PT Goals Patient Stated Goal: to get better  PT Goal Formulation: With patient Time For Goal Achievement: 08/13/17 Potential to Achieve Goals: Good Progress towards PT goals: Progressing toward goals    Frequency    7X/week      PT Plan Current plan remains appropriate    Co-evaluation              AM-PAC PT "6 Clicks" Daily Activity  Outcome Measure  Difficulty turning over in bed (including adjusting bedclothes, sheets and blankets)?: A Little Difficulty moving from lying on back to sitting on the side of the bed? : Unable Difficulty sitting down on and standing up from a chair with arms (e.g., wheelchair, bedside commode, etc,.)?: Unable Help needed moving to and from a bed to chair (including a wheelchair)?: A Little Help needed walking in hospital room?: A Little Help needed climbing 3-5 steps with a railing? : A Lot 6 Click Score: 13    End of Session Equipment Utilized During Treatment: Gait belt Activity Tolerance: Patient tolerated treatment well Patient left: in chair;with call bell/phone within reach;with family/visitor present;with chair alarm set Nurse Communication: Mobility status PT Visit Diagnosis: Unsteadiness on feet (R26.81);Other abnormalities of gait and mobility (R26.89);Pain Pain - Right/Left: Left Pain - part of body: Knee     Time: 1453-1530 PT Time Calculation (min) (ACUTE ONLY): 37 min  Charges:  $Gait Training: 8-22 mins $Therapeutic Exercise: 8-22 mins                    G Codes:       Earney Navy, PTA Pager: [phone]     Darliss Cheney 08/07/2017, 4:13 PM

## 2017-08-08 ENCOUNTER — Encounter (HOSPITAL_COMMUNITY): Payer: Self-pay | Admitting: Orthopedic Surgery

## 2017-08-08 DIAGNOSIS — M1990 Unspecified osteoarthritis, unspecified site: Secondary | ICD-10-CM | POA: Diagnosis not present

## 2017-08-08 DIAGNOSIS — M79662 Pain in left lower leg: Secondary | ICD-10-CM | POA: Diagnosis not present

## 2017-08-08 DIAGNOSIS — S8990XA Unspecified injury of unspecified lower leg, initial encounter: Secondary | ICD-10-CM | POA: Diagnosis not present

## 2017-08-08 DIAGNOSIS — M6281 Muscle weakness (generalized): Secondary | ICD-10-CM | POA: Diagnosis not present

## 2017-08-08 DIAGNOSIS — F329 Major depressive disorder, single episode, unspecified: Secondary | ICD-10-CM | POA: Diagnosis not present

## 2017-08-08 DIAGNOSIS — R2681 Unsteadiness on feet: Secondary | ICD-10-CM | POA: Diagnosis not present

## 2017-08-08 DIAGNOSIS — R41841 Cognitive communication deficit: Secondary | ICD-10-CM | POA: Diagnosis not present

## 2017-08-08 DIAGNOSIS — Z96652 Presence of left artificial knee joint: Secondary | ICD-10-CM | POA: Diagnosis not present

## 2017-08-08 DIAGNOSIS — M1712 Unilateral primary osteoarthritis, left knee: Secondary | ICD-10-CM | POA: Diagnosis not present

## 2017-08-08 DIAGNOSIS — R32 Unspecified urinary incontinence: Secondary | ICD-10-CM | POA: Diagnosis not present

## 2017-08-08 DIAGNOSIS — I639 Cerebral infarction, unspecified: Secondary | ICD-10-CM | POA: Diagnosis not present

## 2017-08-08 DIAGNOSIS — I350 Nonrheumatic aortic (valve) stenosis: Secondary | ICD-10-CM | POA: Diagnosis not present

## 2017-08-08 DIAGNOSIS — D649 Anemia, unspecified: Secondary | ICD-10-CM | POA: Diagnosis not present

## 2017-08-08 DIAGNOSIS — R278 Other lack of coordination: Secondary | ICD-10-CM | POA: Diagnosis not present

## 2017-08-08 DIAGNOSIS — I050 Rheumatic mitral stenosis: Secondary | ICD-10-CM | POA: Diagnosis not present

## 2017-08-08 DIAGNOSIS — I69318 Other symptoms and signs involving cognitive functions following cerebral infarction: Secondary | ICD-10-CM | POA: Diagnosis not present

## 2017-08-08 LAB — CBC
HCT: 35.2 % — ABNORMAL LOW (ref 36.0–46.0)
Hemoglobin: 11.6 g/dL — ABNORMAL LOW (ref 12.0–15.0)
MCH: 29 pg (ref 26.0–34.0)
MCHC: 33 g/dL (ref 30.0–36.0)
MCV: 88 fL (ref 78.0–100.0)
PLATELETS: 180 10*3/uL (ref 150–400)
RBC: 4 MIL/uL (ref 3.87–5.11)
RDW: 15.4 % (ref 11.5–15.5)
WBC: 10.3 10*3/uL (ref 4.0–10.5)

## 2017-08-08 NOTE — Clinical Social Work Note (Signed)
Clinical Social Work Assessment  Patient Details  Name: Christine Christensen MRN: [id number] Date of Birth: 12/06/1945  Date of referral:  08/08/17               Reason for consult:  Facility Placement                Permission sought to share information with:  Chartered certified accountant granted to share information::  Yes, Verbal Permission Granted  Name::     Sondra Blixt  Agency::  [facility]  Relationship::  Son  Contact Information:     Housing/Transportation Living arrangements for the past 2 months:  Single Family Home Source of Information:  Patient Patient Interpreter Needed:  None Criminal Activity/Legal Involvement Pertinent to Current Situation/Hospitalization:  No - Comment as needed Significant Relationships:  Adult Children, Other Family Members Lives with:  Adult Children Do you feel safe going back to the place where you live?  No Need for family participation in patient care:  No (Coment)  Care giving concerns:  Pt from home with son that has chronic condition. She has no assistance at home given new impairment. Pt agreeable to go to SNF as clinical team recommends.  Social Worker assessment / plan:  CSW met with patient at bedside to discuss SNF options/placements. Pt has never experienced SNF and CSW answered questions.  CSW discussed the SNF bed offers and patient interested in [facility]. CSW obtained permission to send to SNF. CSW discussed insurance auth process and will initiate at appropriate time. CSW discussed transport to SNF.  CSW will f/uf or disposition.  Employment status:  Retired Nurse, adult PT Recommendations:  [place] / Referral to community resources:  [place]  Patient/Family's Response to care:  Patient thanked CSW for meeting and assisting with SNF options. No other issues identified.  Patient/Family's Understanding of and Emotional Response to  Diagnosis, Current Treatment, and Prognosis:  Patient appears to have some understanding of diagnosis, current treatment and prognosis as she is agreeable for the need for SNF and agrees with clinical teams recommendation. She identified that she has no one at home to assist given her new impairment. No other issues or concerns identified.  Emotional Assessment Appearance:  Appears stated age Attitude/Demeanor/Rapport:  (Cooperative) Affect (typically observed):  Accepting, Appropriate Orientation:  Oriented to Situation, Oriented to  Time, Oriented to Place, Oriented to Self Alcohol / Substance use:  Not Applicable Psych involvement (Current and /or in the community):  No (Comment)  Discharge Needs  Concerns to be addressed:  Care Coordination Readmission within the last 30 days:  No Current discharge risk:  Dependent with Mobility, Physical Impairment Barriers to Discharge:  No Barriers Identified   Normajean Baxter, LCSW 08/08/2017, 10:16 AM

## 2017-08-08 NOTE — Plan of Care (Signed)
  Skin Integrity: Signs of wound healing will improve 08/08/2017 1400 - Adequate for Discharge by Governor Rooks, RN   Pain Management: Pain level will decrease with appropriate interventions 08/08/2017 1400 - Adequate for Discharge by Governor Rooks, RN   Activity: Range of joint motion will improve 08/08/2017 1400 - Adequate for Discharge by Governor Rooks, RN   Activity: Ability to avoid complications of mobility impairment will improve 08/08/2017 1400 - Adequate for Discharge by Governor Rooks, RN

## 2017-08-08 NOTE — Clinical Social Work Placement (Signed)
   CLINICAL SOCIAL WORK PLACEMENT  NOTE  Date:  08/08/2017  Patient Details  Name: Christine Christensen MRN: [id number] Date of Birth: 1945-12-19  Clinical Social Work is seeking post-discharge placement for this patient at the [place] level of care (*CSW will initial, date and re-position this form in  chart as items are completed):  Yes   Patient/family provided with [place] Work Department's list of facilities offering this level of care within the geographic area requested by the patient (or if unable, by the patient's family).  Yes   Patient/family informed of their freedom to choose among providers that offer the needed level of care, that participate in Medicare, Medicaid or managed care program needed by the patient, have an available bed and are willing to accept the patient.  Yes   Patient/family informed of [facility]'s ownership interest in [facility] and [facility], as well as of the fact that they are under no obligation to receive care at these facilities.  PASRR submitted to EDS on       PASRR number received on       Existing PASRR number confirmed on 08/08/17     FL2 transmitted to all facilities in geographic area requested by pt/family on       FL2 transmitted to all facilities within larger geographic area on 08/08/17     Patient informed that his/her managed care company has contracts with or will negotiate with certain facilities, including the following:        Yes   Patient/family informed of bed offers received.  Patient chooses bed at [place]     Physician recommends and patient chooses bed at      Patient to be transferred to [place] on 08/08/17.  Patient to be transferred to facility by [facility]      Patient family notified on 08/08/17 of transfer.  Name of family member notified:  Fitz,Brandon - son      PHYSICIAN       Additional Comment:     _______________________________________________ Normajean Baxter, LCSW 08/08/2017, 11:18 AM

## 2017-08-08 NOTE — Discharge Summary (Signed)
Pt being discharged to [place] [facility]. Report called and given to [facility]. Pt being transported via [facility]. Pt's daughter took her shoes home, but spoke to on phone and said that she would bring them to facility. All other belongings sent with her. Pt in no distress at discharge.

## 2017-08-08 NOTE — Progress Notes (Signed)
Physical Therapy Treatment Patient Details Name: Christine Christensen MRN: [id number] DOB: 13-Apr-1946 Today's Date: 08/08/2017    History of Present Illness Pt is a 71 y.o female s/p elective L TKA. PMH includes HTN, Heart murmur, CVA with R residual symptoms, and R TKA.     PT Comments    Patient tolerated session well. Pt required min/mod A for OOB mobility and mod/max cues for completion of HEP. Current plan remains appropriate.    Follow Up Recommendations  DC plan and follow up therapy as arranged by surgeon;Supervision/Assistance - 24 hour     Equipment Recommendations  None recommended by PT    Recommendations for Other Services       Precautions / Restrictions Precautions Precautions: Knee Precaution Booklet Issued: Yes (comment) Precaution Comments: precautions/positioning reviewed with pt Restrictions Weight Bearing Restrictions: Yes LLE Weight Bearing: Weight bearing as tolerated    Mobility  Bed Mobility               General bed mobility comments: pt OOB in chair upon arrival  Transfers Overall transfer level: Needs assistance Equipment used: Rolling walker (2 wheeled) Transfers: Sit to/from Stand Sit to Stand: Min assist         General transfer comment: assist to power up into standing and to gain balance upon stand; cues for safe hand placement  Ambulation/Gait Ambulation/Gait assistance: Min assist;Mod assist Ambulation Distance (Feet): 90 Feet Assistive device: Rolling walker (2 wheeled) Gait Pattern/deviations: Decreased step length - right;Decreased weight shift to left;Antalgic;Decreased stance time - left;Step-through pattern;Decreased dorsiflexion - right;Trunk flexed Gait velocity: Decreased   General Gait Details: cues for posture and sequencing; assist for balance   Stairs            Wheelchair Mobility    Modified Rankin (Stroke Patients Only)       Balance Overall balance assessment: Needs  assistance Sitting-balance support: No upper extremity supported;Feet supported Sitting balance-Leahy Scale: Good     Standing balance support: Bilateral upper extremity supported;During functional activity Standing balance-Leahy Scale: Poor Standing balance comment: posterior LOB when attempting to fix clothing without UE support; pt educated to maintain use of AD when standing                            Cognition Arousal/Alertness: Awake/alert Behavior During Therapy: WFL for tasks assessed/performed Overall Cognitive Status: History of cognitive impairments - at baseline Area of Impairment: Attention;Memory;Following commands;Problem solving                   Current Attention Level: Selective Memory: Decreased short-term memory;Decreased recall of precautions Following Commands: Follows one step commands with increased time;Follows multi-step commands inconsistently     Problem Solving: Decreased initiation;Requires verbal cues;Slow processing General Comments: CVA at baseline       Exercises Total Joint Exercises Ankle Circles/Pumps: AROM;Both;20 reps Quad Sets: AROM;Left;10 reps Short Arc Quad: AROM;Left;10 reps Heel Slides: AROM;Left;10 reps Hip ABduction/ADduction: AROM;Left;10 reps Straight Leg Raises: AROM;Left;10 reps Long Arc Quad: AROM;Left;10 reps;Seated Knee Flexion: AROM;Left;5 reps;Seated;Other (comment)(10 sec holds) Goniometric ROM: 0-90    General Comments        Pertinent Vitals/Pain Pain Assessment: 0-10 Faces Pain Scale: Hurts little more(6 with some therex) Pain Location: L knee  Pain Descriptors / Indicators: Guarding;Sore;Grimacing Pain Intervention(s): Limited activity within patient's tolerance;Monitored during session;Repositioned    Home Living  Prior Function            PT Goals (current goals can now be found in the care plan section) Acute Rehab PT Goals Patient Stated Goal: to get  better  PT Goal Formulation: With patient Time For Goal Achievement: 08/13/17 Potential to Achieve Goals: Good Progress towards PT goals: Progressing toward goals    Frequency    7X/week      PT Plan Current plan remains appropriate    Co-evaluation              AM-PAC PT "6 Clicks" Daily Activity  Outcome Measure  Difficulty turning over in bed (including adjusting bedclothes, sheets and blankets)?: A Little Difficulty moving from lying on back to sitting on the side of the bed? : Unable Difficulty sitting down on and standing up from a chair with arms (e.g., wheelchair, bedside commode, etc,.)?: Unable Help needed moving to and from a bed to chair (including a wheelchair)?: A Little Help needed walking in hospital room?: A Little Help needed climbing 3-5 steps with a railing? : A Lot 6 Click Score: 13    End of Session Equipment Utilized During Treatment: Gait belt Activity Tolerance: Patient tolerated treatment well Patient left: in chair;with call bell/phone within reach;with chair alarm set Nurse Communication: Mobility status PT Visit Diagnosis: Unsteadiness on feet (R26.81);Other abnormalities of gait and mobility (R26.89);Pain Pain - Right/Left: Left Pain - part of body: Knee     Time: 1027-1119 PT Time Calculation (min) (ACUTE ONLY): 52 min  Charges:  $Gait Training: 8-22 mins $Therapeutic Exercise: 23-37 mins                    G Codes:       Earney Navy, PTA Pager: [phone]     Darliss Cheney 08/08/2017, 11:38 AM

## 2017-08-08 NOTE — Discharge Summary (Signed)
SPORTS MEDICINE & JOINT REPLACEMENT   Lara Mulch, MD   [name], PA-C [place], [place], [place]  [address]                             [phone]  PATIENT ID: Christine Christensen        MRN:  [id number]          DOB/AGE: 71-Nov-1947 / 71 y.o.    DISCHARGE SUMMARY  ADMISSION DATE:    08/06/2017 DISCHARGE DATE:   08/08/2017   ADMISSION DIAGNOSIS: primary osteoarthritis left knee    DISCHARGE DIAGNOSIS:  primary osteoarthritis left knee    ADDITIONAL DIAGNOSIS: Active Problems:   S/P total knee replacement  Past Medical History:  Diagnosis Date  . Anemia    history of anemia  . Aortic stenosis    mild aortic stenosis 07/27/17 echo  . Arthritis   . Heart murmur   . Hypertension   . Mitral stenosis    moderate mitral stenosis 07/27/17 echo  . Stroke [facility])    stroke x2, 2005 and 2008, some right sided weakness  . Urinary incontinence     PROCEDURE: Procedure(s): LEFT TOTAL KNEE ARTHROPLASTY on 08/06/2017  CONSULTS:    HISTORY:  See H&P in chart  HOSPITAL COURSE:  Christine Christensen is a 71 y.o. admitted on 08/06/2017 and found to have a diagnosis of primary osteoarthritis left knee.  After appropriate laboratory studies were obtained  they were taken to the operating room on 08/06/2017 and underwent Procedure(s): LEFT TOTAL KNEE ARTHROPLASTY.   They were given perioperative antibiotics:  Anti-infectives (From admission, onward)   Start     Dose/Rate Route Frequency Ordered Stop   08/06/17 1445  ceFAZolin (ANCEF) IVPB 1 g/50 mL premix     1 g 100 mL/hr over 30 Minutes Intravenous Every 6 hours 08/06/17 1432 08/06/17 2217   08/06/17 0700  ceFAZolin (ANCEF) IVPB 2g/100 mL premix     2 g 200 mL/hr over 30 Minutes Intravenous To ShortStay Surgical 08/03/17 0940 08/06/17 0826    .  Patient given tranexamic acid IV or topical and exparel intra-operatively.  Tolerated the procedure well.    POD# 1: Vital signs were stable.  Patient denied Chest pain,  shortness of breath, or calf pain.  Patient was started on Lovenox 30 mg subcutaneously twice daily at 8am.  Consults to PT, OT, and care management were made.  The patient was weight bearing as tolerated.  CPM was placed on the operative leg 0-90 degrees for 6-8 hours a day. When out of the CPM, patient was placed in the foam block to achieve full extension. Incentive spirometry was taught.  Dressing was changed.       POD #2, Continued  PT for ambulation and exercise program.  IV saline locked.  O2 discontinued.    The remainder of the hospital course was dedicated to ambulation and strengthening.   The patient was discharged on 2 Days Post-Op in  Good condition.  Blood products given:none  DIAGNOSTIC STUDIES: Recent vital signs:  Patient Vitals for the past 24 hrs:  BP Temp Temp src Pulse Resp SpO2  08/08/17 0500 136/74 98.2 F (36.8 C) Oral 72 16 98 %  08/07/17 2119 (!) 150/73 98.7 F (37.1 C) Oral 79 18 99 %  08/07/17 1459 (!) 149/65 98.7 F (37.1 C) Oral 83 18 98 %       Recent laboratory studies:  Recent Labs    08/07/17 0506 08/08/17 0634  WBC 10.4 10.3  HGB 11.5* 11.6*  HCT 35.1* 35.2*  PLT 196 180   Recent Labs    08/07/17 0506  NA 137  K 3.7  CL 102  CO2 28  BUN 8  CREATININE 0.68  GLUCOSE 97  CALCIUM 9.1   No results found for: INR, PROTIME   Recent Radiographic Studies :  No results found.  DISCHARGE INSTRUCTIONS: Discharge Instructions    CPM   Complete by:  As directed    Continuous passive motion machine (CPM):      Use the CPM from 0 to 90 for 4-6 hours per day.      You may increase by 10 per day.  You may break it up into 2 or 3 sessions per day.      Use CPM for 2 weeks or until you are told to stop.   Call MD / Call 911   Complete by:  As directed    If you experience chest pain or shortness of breath, CALL 911 and be transported to the hospital emergency room.  If you develope a fever above 101 F, pus (white drainage) or increased  drainage or redness at the wound, or calf pain, call your surgeon's office.   Constipation Prevention   Complete by:  As directed    Drink plenty of fluids.  Prune juice may be helpful.  You may use a stool softener, such as Colace (over the counter) 100 mg twice a day.  Use MiraLax (over the counter) for constipation as needed.   Diet - low sodium heart healthy   Complete by:  As directed    Discharge instructions   Complete by:  As directed    INSTRUCTIONS AFTER JOINT REPLACEMENT   Remove items at home which could result in a fall. This includes throw rugs or furniture in walking pathways ICE to the affected joint every three hours while awake for 30 minutes at a time, for at least the first 3-5 days, and then as needed for pain and swelling.  Continue to use ice for pain and swelling. You may notice swelling that will progress down to the foot and ankle.  This is normal after surgery.  Elevate your leg when you are not up walking on it.   Continue to use the breathing machine you got in the hospital (incentive spirometer) which will help keep your temperature down.  It is common for your temperature to cycle up and down following surgery, especially at night when you are not up moving around and exerting yourself.  The breathing machine keeps your lungs expanded and your temperature down.   DIET:  As you were doing prior to hospitalization, we recommend a well-balanced diet.  DRESSING / WOUND CARE / SHOWERING  Keep the surgical dressing until follow up.  The dressing is water proof, so you can shower without any extra covering.  IF THE DRESSING FALLS OFF or the wound gets wet inside, change the dressing with sterile gauze.  Please use good hand washing techniques before changing the dressing.  Do not use any lotions or creams on the incision until instructed by your surgeon.    ACTIVITY  Increase activity slowly as tolerated, but follow the weight bearing instructions below.   No driving  for 6 weeks or until further direction given by your physician.  You cannot drive while taking narcotics.  No lifting or carrying greater than 10  lbs. until further directed by your surgeon. Avoid periods of inactivity such as sitting longer than an hour when not asleep. This helps prevent blood clots.  You may return to work once you are authorized by your doctor.     WEIGHT BEARING   Weight bearing as tolerated with assist device (walker, cane, etc) as directed, use it as long as suggested by your surgeon or therapist, typically at least 4-6 weeks.   EXERCISES  Results after joint replacement surgery are often greatly improved when you follow the exercise, range of motion and muscle strengthening exercises prescribed by your doctor. Safety measures are also important to protect the joint from further injury. Any time any of these exercises cause you to have increased pain or swelling, decrease what you are doing until you are comfortable again and then slowly increase them. If you have problems or questions, call your caregiver or physical therapist for advice.   Rehabilitation is important following a joint replacement. After just a few days of immobilization, the muscles of the leg can become weakened and shrink (atrophy).  These exercises are designed to build up the tone and strength of the thigh and leg muscles and to improve motion. Often times heat used for twenty to thirty minutes before working out will loosen up your tissues and help with improving the range of motion but do not use heat for the first two weeks following surgery (sometimes heat can increase post-operative swelling).   These exercises can be done on a training (exercise) mat, on the floor, on a table or on a bed. Use whatever works the best and is most comfortable for you.    Use music or television while you are exercising so that the exercises are a pleasant break in your day. This will make your life better with the  exercises acting as a break in your routine that you can look forward to.   Perform all exercises about fifteen times, three times per day or as directed.  You should exercise both the operative leg and the other leg as well.   Exercises include:   Quad Sets - Tighten up the muscle on the front of the thigh (Quad) and hold for 5-10 seconds.   Straight Leg Raises - With your knee straight (if you were given a brace, keep it on), lift the leg to 60 degrees, hold for 3 seconds, and slowly lower the leg.  Perform this exercise against resistance later as your leg gets stronger.  Leg Slides: Lying on your back, slowly slide your foot toward your buttocks, bending your knee up off the floor (only go as far as is comfortable). Then slowly slide your foot back down until your leg is flat on the floor again.  Angel Wings: Lying on your back spread your legs to the side as far apart as you can without causing discomfort.  Hamstring Strength:  Lying on your back, push your heel against the floor with your leg straight by tightening up the muscles of your buttocks.  Repeat, but this time bend your knee to a comfortable angle, and push your heel against the floor.  You may put a pillow under the heel to make it more comfortable if necessary.   A rehabilitation program following joint replacement surgery can speed recovery and prevent re-injury in the future due to weakened muscles. Contact your doctor or a physical therapist for more information on knee rehabilitation.    CONSTIPATION  Constipation is defined medically  as fewer than three stools per week and severe constipation as less than one stool per week.  Even if you have a regular bowel pattern at home, your normal regimen is likely to be disrupted due to multiple reasons following surgery.  Combination of anesthesia, postoperative narcotics, change in appetite and fluid intake all can affect your bowels.   YOU MUST use at least one of the following  options; they are listed in order of increasing strength to get the job done.  They are all available over the counter, and you may need to use some, POSSIBLY even all of these options:    Drink plenty of fluids (prune juice may be helpful) and high fiber foods Colace 100 mg by mouth twice a day  Senokot for constipation as directed and as needed Dulcolax (bisacodyl), take with full glass of water  Miralax (polyethylene glycol) once or twice a day as needed.  If you have tried all these things and are unable to have a bowel movement in the first 3-4 days after surgery call either your surgeon or your primary doctor.    If you experience loose stools or diarrhea, hold the medications until you stool forms back up.  If your symptoms do not get better within 1 week or if they get worse, check with your doctor.  If you experience "the worst abdominal pain ever" or develop nausea or vomiting, please contact the office immediately for further recommendations for treatment.   ITCHING:  If you experience itching with your medications, try taking only a single pain pill, or even half a pain pill at a time.  You can also use Benadryl over the counter for itching or also to help with sleep.   TED HOSE STOCKINGS:  Use stockings on both legs until for at least 2 weeks or as directed by physician office. They may be removed at night for sleeping.  MEDICATIONS:  See your medication summary on the "After Visit Summary" that nursing will review with you.  You may have some home medications which will be placed on hold until you complete the course of blood thinner medication.  It is important for you to complete the blood thinner medication as prescribed.  PRECAUTIONS:  If you experience chest pain or shortness of breath - call 911 immediately for transfer to the hospital emergency department.   If you develop a fever greater that 101 F, purulent drainage from wound, increased redness or drainage from wound, foul  odor from the wound/dressing, or calf pain - CONTACT YOUR SURGEON.                                                   FOLLOW-UP APPOINTMENTS:  If you do not already have a post-op appointment, please call the office for an appointment to be seen by your surgeon.  Guidelines for how soon to be seen are listed in your "After Visit Summary", but are typically between 1-4 weeks after surgery.  OTHER INSTRUCTIONS:   Knee Replacement:  Do not place pillow under knee, focus on keeping the knee straight while resting. CPM instructions: 0-90 degrees, 2 hours in the morning, 2 hours in the afternoon, and 2 hours in the evening. Place foam block, curve side up under heel at all times except when in CPM or when walking.  DO NOT modify,  tear, cut, or change the foam block in any way.  MAKE SURE YOU:  Understand these instructions.  Get help right away if you are not doing well or get worse.    Thank you for letting us be a part of your medical care team.  It is a privilege we respect greatly.  We hope these instructions will help you stay on track for a fast and full recovery!   Increase activity slowly as tolerated   Complete by:  As directed       DISCHARGE MEDICATIONS:   Allergies as of 08/08/2017   No Known Allergies     Medication List    STOP taking these medications   aspirin 325 MG tablet Replaced by:  aspirin 325 MG EC tablet   diclofenac 50 MG tablet Commonly known as:  CATAFLAM   naproxen sodium 220 MG tablet Commonly known as:  ALEVE   OVER THE COUNTER MEDICATION     TAKE these medications   aspirin 325 MG EC tablet Take 1 tablet (325 mg total) 2 (two) times daily by mouth. Replaces:  aspirin 325 MG tablet   escitalopram 20 MG tablet Commonly known as:  LEXAPRO Take 20 mg daily by mouth.   methocarbamol 500 MG tablet Commonly known as:  ROBAXIN Take 1-2 tablets (500-1,000 mg total) every 6 (six) hours as needed by mouth for muscle spasms.   oxyCODONE 5 MG immediate  release tablet Commonly known as:  Oxy IR/ROXICODONE Take 1-2 tablets (5-10 mg total) every 4 (four) hours as needed by mouth for moderate pain ((score 4 to 6)).   VITAMIN D3 PO Take 1 capsule daily by mouth.       FOLLOW UP VISIT:    DISPOSITION: HOME VS. SNF  CONDITION:  Good   Donia Ast 08/08/2017, 9:56 AM

## 2017-08-08 NOTE — Social Work (Addendum)
Clinical Social Worker facilitated patient discharge including contacting patient family and facility to confirm patient discharge plans.  Clinical information faxed to facility and family agreeable with plan.    CSW arranged ambulance transport via [facility] to [facility]..    RN to call [phone] to give report prior to discharge. Pt going to Room 210.  Clinical Social Worker will sign off for now as social work intervention is no longer needed. Please consult [place] again if new need arises.  Elissa Hefty, LCSW Clinical Social Worker [phone]

## 2017-08-08 NOTE — Social Work (Signed)
CSW rec'd [facility]#[id number] [facility] for SNF placement at [facility].  CSW will f/u for disposition.  Elissa Hefty, LCSW Clinical Social Worker [phone]

## 2017-08-08 NOTE — Social Work (Signed)
CSW discussed bed offers with patient. Pt accepted bed offer from [facility].  CSW confirmed bed offer with admission staff at SNF.  CSW contacted [facility] [employer] to obtain Insurance auth as patient DC today.  CSW will continue to follow-up.  Elissa Hefty, LCSW Clinical Social Worker [phone]

## 2017-08-11 DIAGNOSIS — I639 Cerebral infarction, unspecified: Secondary | ICD-10-CM | POA: Diagnosis not present

## 2017-08-11 DIAGNOSIS — I350 Nonrheumatic aortic (valve) stenosis: Secondary | ICD-10-CM | POA: Diagnosis not present

## 2017-08-11 DIAGNOSIS — I050 Rheumatic mitral stenosis: Secondary | ICD-10-CM | POA: Diagnosis not present

## 2017-08-11 DIAGNOSIS — Z96652 Presence of left artificial knee joint: Secondary | ICD-10-CM | POA: Diagnosis not present

## 2017-08-11 DIAGNOSIS — D649 Anemia, unspecified: Secondary | ICD-10-CM | POA: Diagnosis not present

## 2017-08-11 DIAGNOSIS — R32 Unspecified urinary incontinence: Secondary | ICD-10-CM | POA: Diagnosis not present

## 2017-08-13 NOTE — Op Note (Signed)
TOTAL KNEE REPLACEMENT OPERATIVE NOTE:  08/06/2017  11:36 AM  PATIENT:  Christine Christensen  71 y.o. female  PRE-OPERATIVE DIAGNOSIS:  primary osteoarthritis left knee  POST-OPERATIVE DIAGNOSIS:  primary osteoarthritis left knee  PROCEDURE:  Procedure(s): LEFT TOTAL KNEE ARTHROPLASTY  SURGEON:  Surgeon(s): Vickey Huger, MD  PHYSICIAN ASSISTANT:Blair Mancel Bale, [facility]  ANESTHESIA:   spinal  DRAINS: Hemovac  SPECIMEN: None  COUNTS:  Correct  TOURNIQUET:   Total Tourniquet Time Documented: Thigh (Left) - 46 minutes Total: Thigh (Left) - 46 minutes   DICTATION:  Indication for procedure:    The patient is a 71 y.o. female who has failed conservative treatment for primary osteoarthritis left knee.  Informed consent was obtained prior to anesthesia. The risks versus benefits of the operation were explain and in a way the patient can, and did, understand.   On the implant demand matching protocol, this patient scored 8.  Therefore, this patient was not receive a polyethylene insert with vitamin E which is a high demand implant.  Description of procedure:     The patient was taken to the operating room and placed under anesthesia.  The patient was positioned in the usual fashion taking care that all body parts were adequately padded and/or protected.  I foley catheter was not placed.  A tourniquet was applied and the leg prepped and draped in the usual sterile fashion.  The extremity was exsanguinated with the esmarch and tourniquet inflated to 350 mmHg.  Pre-operative range of motion was normal.  The knee was in 5 degree of mild varus.  A midline incision approximately 6-7 inches long was made with a #10 blade.  A new blade was used to make a parapatellar arthrotomy going 2-3 cm into the quadriceps tendon, over the patella, and alongside the medial aspect of the patellar tendon.  A synovectomy was then performed with the #10 blade and forceps. I then elevated the deep MCL off the  medial tibial metaphysis subperiosteally around to the semimembranosus attachment.    I everted the patella and used calipers to measure patellar thickness.  I used the reamer to ream down to appropriate thickness to recreate the native thickness.  I then removed excess bone with the rongeur and sagittal saw.  I used the appropriately sized template and drilled the three lug holes.  I then put the trial in place and measured the thickness with the calipers to ensure recreation of the native thickness.  The trial was then removed and the patella subluxed and the knee brought into flexion.  A homan retractor was place to retract and protect the patella and lateral structures.  A Z-retractor was place medially to protect the medial structures.  The extra-medullary alignment system was used to make cut the tibial articular surface perpendicular to the anamotic axis of the tibia and in 3 degrees of posterior slope.  The cut surface and alignment jig was removed.  I then used the intramedullary alignment guide to make a 6 valgus cut on the distal femur.  I then marked out the epicondylar axis on the distal femur.  The posterior condylar axis measured 3 degrees.  I then used the anterior referencing sizer and measured the femur to be a size 5.  The 4-In-1 cutting block was screwed into place in external rotation matching the posterior condylar angle, making our cuts perpendicular to the epicondylar axis.  Anterior, posterior and chamfer cuts were made with the sagittal saw.  The cutting block and cut pieces were  removed.  A lamina spreader was placed in 90 degrees of flexion.  The ACL, PCL, menisci, and posterior condylar osteophytes were removed.  A 10 mm spacer blocked was found to offer good flexion and extension gap balance after minimal in degree releasing.   The scoop retractor was then placed and the femoral finishing block was pinned in place.  The small sagittal saw was used as well as the lug drill to  finish the femur.  The block and cut surfaces were removed and the medullary canal hole filled with autograft bone from the cut pieces.  The tibia was delivered forward in deep flexion and external rotation.  A size D tray was selected and pinned into place centered on the medial 1/3 of the tibial tubercle.  The reamer and keel was used to prepare the tibia through the tray.    I then trialed with the size 5 femur, size D tibia, a 14 mm insert and the 32 patella.  I had excellent flexion/extension gap balance, excellent patella tracking.  Flexion was full and beyond 120 degrees; extension was zero.  These components were chosen and the staff opened them to me on the back table while the knee was lavaged copiously and the cement mixed.  The soft tissue was infiltrated with 60cc of exparel 1.3% through a 21 gauge needle.  I cemented in the components and removed all excess cement.  The polyethylene tibial component was snapped into place and the knee placed in extension while cement was hardening.  The capsule was infilltrated with 30cc of .25% Marcaine with epinephrine.  A hemovac was place in the joint exiting superolaterally.  A pain pump was place superomedially superficial to the arthrotomy.  Once the cement was hard, the tourniquet was let down.  Hemostasis was obtained.  The arthrotomy was closed with figure-8 #1 vicryl sutures.  The deep soft tissues were closed with #0 vicryls and the subcuticular layer closed with a running #2-0 vicryl.  The skin was reapproximated and closed with skin staples.  The wound was dressed with xeroform, 4 x4's, 2 ABD sponges, a single layer of webril and a TED stocking.   The patient was then awakened, extubated, and taken to the recovery room in stable condition.  BLOOD LOSS:  300cc DRAINS: 1 hemovac, 1 pain catheter COMPLICATIONS:  None.  PLAN OF CARE: Admit for overnight observation  PATIENT DISPOSITION:  PACU - hemodynamically stable.   Delay start of  Pharmacological VTE agent (>24hrs) due to surgical blood loss or risk of bleeding:  not applicable  Please fax a copy of this op note to my office at [phone] (please only include page 1 and 2 of the Case Information op note)

## 2017-08-16 ENCOUNTER — Encounter (HOSPITAL_COMMUNITY): Payer: Self-pay

## 2017-08-16 DIAGNOSIS — Z96652 Presence of left artificial knee joint: Secondary | ICD-10-CM | POA: Diagnosis not present

## 2017-08-16 DIAGNOSIS — M25462 Effusion, left knee: Secondary | ICD-10-CM | POA: Diagnosis not present

## 2017-08-16 DIAGNOSIS — Z471 Aftercare following joint replacement surgery: Secondary | ICD-10-CM | POA: Diagnosis not present

## 2017-08-17 ENCOUNTER — Other Ambulatory Visit: Payer: Self-pay

## 2017-08-17 DIAGNOSIS — M25562 Pain in left knee: Secondary | ICD-10-CM | POA: Diagnosis not present

## 2017-08-17 DIAGNOSIS — M6281 Muscle weakness (generalized): Secondary | ICD-10-CM | POA: Diagnosis not present

## 2017-08-17 DIAGNOSIS — M1712 Unilateral primary osteoarthritis, left knee: Secondary | ICD-10-CM | POA: Diagnosis not present

## 2017-08-17 DIAGNOSIS — Z471 Aftercare following joint replacement surgery: Secondary | ICD-10-CM | POA: Diagnosis not present

## 2017-08-17 NOTE — Patient Outreach (Signed)
[facility]) Care Management  08/17/2017  KYANN HEYDT 11-03-1945 [id number]   Referral Date: 08/17/17 Referral Source: [facility]TA Date of Admission: ? Diagnosis: ? Date of Discharge: 08/15/17 Facility: [place]: HTA   Telephone call to patient for TOC.  No answer.  HIPAA compliant voice message left.   Plan: RN CM will contact patient again within 3 business days.  Jone Baseman, RN, MSN [facility] Care Management Care Management Coordinator Direct Line [phone] Toll Free: [phone]  Fax: [phone]

## 2017-08-20 ENCOUNTER — Other Ambulatory Visit: Payer: Self-pay

## 2017-08-20 NOTE — Patient Outreach (Signed)
[facility]) Care Management  08/20/2017  Christine Christensen Jul 05, 1946 [id number]   2nd telephone call to patient for transition of care assessment call. No answer. HIPAA compliant voice message left.  Plan: RN CM will attempt patient again within 3 business days.  Jone Baseman, RN, MSN [facility] Care Management Care Management Coordinator Direct Line [phone] Toll Free: [phone]  Fax: [phone]

## 2017-08-21 ENCOUNTER — Other Ambulatory Visit: Payer: Self-pay

## 2017-08-21 DIAGNOSIS — M179 Osteoarthritis of knee, unspecified: Secondary | ICD-10-CM | POA: Diagnosis not present

## 2017-08-21 DIAGNOSIS — Z716 Tobacco abuse counseling: Secondary | ICD-10-CM | POA: Diagnosis not present

## 2017-08-21 DIAGNOSIS — F419 Anxiety disorder, unspecified: Secondary | ICD-10-CM | POA: Diagnosis not present

## 2017-08-21 DIAGNOSIS — F17210 Nicotine dependence, cigarettes, uncomplicated: Secondary | ICD-10-CM | POA: Diagnosis not present

## 2017-08-21 NOTE — Patient Outreach (Signed)
[facility]) Care Management  08/21/2017  Christine Christensen November 16, 1945 [id number]   3rd Telephone call to patient for transition of care assessment.  No answer.  HIPAA compliant voice message left.  Plan: RN CM will send letter to attempt outreach.  If no response within 10 business days will close case.   Jone Baseman, RN, MSN [facility] Care Management Care Management Coordinator Direct Line [phone] Toll Free: [phone]  Fax: [phone]

## 2017-08-22 DIAGNOSIS — M25562 Pain in left knee: Secondary | ICD-10-CM | POA: Diagnosis not present

## 2017-08-22 DIAGNOSIS — M6281 Muscle weakness (generalized): Secondary | ICD-10-CM | POA: Diagnosis not present

## 2017-08-22 DIAGNOSIS — M1712 Unilateral primary osteoarthritis, left knee: Secondary | ICD-10-CM | POA: Diagnosis not present

## 2017-08-22 DIAGNOSIS — Z471 Aftercare following joint replacement surgery: Secondary | ICD-10-CM | POA: Diagnosis not present

## 2017-09-04 ENCOUNTER — Other Ambulatory Visit: Payer: Self-pay

## 2017-09-04 DIAGNOSIS — M1712 Unilateral primary osteoarthritis, left knee: Secondary | ICD-10-CM | POA: Diagnosis not present

## 2017-09-04 DIAGNOSIS — M25562 Pain in left knee: Secondary | ICD-10-CM | POA: Diagnosis not present

## 2017-09-04 DIAGNOSIS — M6281 Muscle weakness (generalized): Secondary | ICD-10-CM | POA: Diagnosis not present

## 2017-09-04 DIAGNOSIS — Z471 Aftercare following joint replacement surgery: Secondary | ICD-10-CM | POA: Diagnosis not present

## 2017-09-04 NOTE — Patient Outreach (Signed)
[facility]) Care Management  09/04/2017  Christine Christensen [birth year] [id number]   Multiple attempts to establish contact with patient without success. No response from letter mailed to patient. Case is being closed at this time.   Plan: RN CM will notify care management assistant of case status.    Jone Baseman, RN, MSN [facility] Care Management Care Management Coordinator Direct Line [phone] Toll Free: [phone]  Fax: [phone]

## 2017-09-06 DIAGNOSIS — M6281 Muscle weakness (generalized): Secondary | ICD-10-CM | POA: Diagnosis not present

## 2017-09-06 DIAGNOSIS — M25562 Pain in left knee: Secondary | ICD-10-CM | POA: Diagnosis not present

## 2017-09-06 DIAGNOSIS — Z471 Aftercare following joint replacement surgery: Secondary | ICD-10-CM | POA: Diagnosis not present

## 2017-09-06 DIAGNOSIS — M1712 Unilateral primary osteoarthritis, left knee: Secondary | ICD-10-CM | POA: Diagnosis not present

## 2017-09-12 DIAGNOSIS — M25562 Pain in left knee: Secondary | ICD-10-CM | POA: Diagnosis not present

## 2017-09-12 DIAGNOSIS — M6281 Muscle weakness (generalized): Secondary | ICD-10-CM | POA: Diagnosis not present

## 2017-09-12 DIAGNOSIS — Z471 Aftercare following joint replacement surgery: Secondary | ICD-10-CM | POA: Diagnosis not present

## 2017-09-12 DIAGNOSIS — M1712 Unilateral primary osteoarthritis, left knee: Secondary | ICD-10-CM | POA: Diagnosis not present

## 2017-09-14 DIAGNOSIS — M6281 Muscle weakness (generalized): Secondary | ICD-10-CM | POA: Diagnosis not present

## 2017-09-14 DIAGNOSIS — Z471 Aftercare following joint replacement surgery: Secondary | ICD-10-CM | POA: Diagnosis not present

## 2017-09-14 DIAGNOSIS — M25562 Pain in left knee: Secondary | ICD-10-CM | POA: Diagnosis not present

## 2017-09-14 DIAGNOSIS — M1712 Unilateral primary osteoarthritis, left knee: Secondary | ICD-10-CM | POA: Diagnosis not present

## 2017-09-20 DIAGNOSIS — Z716 Tobacco abuse counseling: Secondary | ICD-10-CM | POA: Diagnosis not present

## 2017-09-20 DIAGNOSIS — J069 Acute upper respiratory infection, unspecified: Secondary | ICD-10-CM | POA: Diagnosis not present

## 2017-09-20 DIAGNOSIS — F17210 Nicotine dependence, cigarettes, uncomplicated: Secondary | ICD-10-CM | POA: Diagnosis not present

## 2017-09-20 DIAGNOSIS — F419 Anxiety disorder, unspecified: Secondary | ICD-10-CM | POA: Diagnosis not present

## 2017-09-20 DIAGNOSIS — M179 Osteoarthritis of knee, unspecified: Secondary | ICD-10-CM | POA: Diagnosis not present

## 2017-09-25 DIAGNOSIS — M1712 Unilateral primary osteoarthritis, left knee: Secondary | ICD-10-CM | POA: Diagnosis not present

## 2017-09-25 DIAGNOSIS — M25562 Pain in left knee: Secondary | ICD-10-CM | POA: Diagnosis not present

## 2017-09-25 DIAGNOSIS — Z471 Aftercare following joint replacement surgery: Secondary | ICD-10-CM | POA: Diagnosis not present

## 2017-09-25 DIAGNOSIS — M6281 Muscle weakness (generalized): Secondary | ICD-10-CM | POA: Diagnosis not present

## 2017-09-27 DIAGNOSIS — M1712 Unilateral primary osteoarthritis, left knee: Secondary | ICD-10-CM | POA: Diagnosis not present

## 2017-09-27 DIAGNOSIS — M6281 Muscle weakness (generalized): Secondary | ICD-10-CM | POA: Diagnosis not present

## 2017-09-27 DIAGNOSIS — M25562 Pain in left knee: Secondary | ICD-10-CM | POA: Diagnosis not present

## 2017-09-27 DIAGNOSIS — Z471 Aftercare following joint replacement surgery: Secondary | ICD-10-CM | POA: Diagnosis not present

## 2017-10-02 DIAGNOSIS — M6281 Muscle weakness (generalized): Secondary | ICD-10-CM | POA: Diagnosis not present

## 2017-10-02 DIAGNOSIS — M1712 Unilateral primary osteoarthritis, left knee: Secondary | ICD-10-CM | POA: Diagnosis not present

## 2017-10-02 DIAGNOSIS — Z471 Aftercare following joint replacement surgery: Secondary | ICD-10-CM | POA: Diagnosis not present

## 2017-10-02 DIAGNOSIS — M25562 Pain in left knee: Secondary | ICD-10-CM | POA: Diagnosis not present

## 2017-10-04 DIAGNOSIS — M1712 Unilateral primary osteoarthritis, left knee: Secondary | ICD-10-CM | POA: Diagnosis not present

## 2017-10-04 DIAGNOSIS — Z471 Aftercare following joint replacement surgery: Secondary | ICD-10-CM | POA: Diagnosis not present

## 2017-10-04 DIAGNOSIS — M6281 Muscle weakness (generalized): Secondary | ICD-10-CM | POA: Diagnosis not present

## 2017-10-04 DIAGNOSIS — M25562 Pain in left knee: Secondary | ICD-10-CM | POA: Diagnosis not present

## 2017-10-11 DIAGNOSIS — M6281 Muscle weakness (generalized): Secondary | ICD-10-CM | POA: Diagnosis not present

## 2017-10-11 DIAGNOSIS — M1712 Unilateral primary osteoarthritis, left knee: Secondary | ICD-10-CM | POA: Diagnosis not present

## 2017-10-11 DIAGNOSIS — Z471 Aftercare following joint replacement surgery: Secondary | ICD-10-CM | POA: Diagnosis not present

## 2017-10-11 DIAGNOSIS — M25562 Pain in left knee: Secondary | ICD-10-CM | POA: Diagnosis not present

## 2017-10-18 DIAGNOSIS — M25562 Pain in left knee: Secondary | ICD-10-CM | POA: Diagnosis not present

## 2017-10-18 DIAGNOSIS — M6281 Muscle weakness (generalized): Secondary | ICD-10-CM | POA: Diagnosis not present

## 2017-10-18 DIAGNOSIS — M1712 Unilateral primary osteoarthritis, left knee: Secondary | ICD-10-CM | POA: Diagnosis not present

## 2017-10-18 DIAGNOSIS — Z471 Aftercare following joint replacement surgery: Secondary | ICD-10-CM | POA: Diagnosis not present

## 2017-10-23 DIAGNOSIS — F419 Anxiety disorder, unspecified: Secondary | ICD-10-CM | POA: Diagnosis not present

## 2017-10-23 DIAGNOSIS — F17210 Nicotine dependence, cigarettes, uncomplicated: Secondary | ICD-10-CM | POA: Diagnosis not present

## 2017-10-23 DIAGNOSIS — M179 Osteoarthritis of knee, unspecified: Secondary | ICD-10-CM | POA: Diagnosis not present

## 2017-10-23 DIAGNOSIS — J302 Other seasonal allergic rhinitis: Secondary | ICD-10-CM | POA: Diagnosis not present

## 2017-10-25 ENCOUNTER — Encounter: Payer: Self-pay | Admitting: Neurology

## 2017-10-25 ENCOUNTER — Ambulatory Visit (INDEPENDENT_AMBULATORY_CARE_PROVIDER_SITE_OTHER): Payer: PPO | Admitting: Neurology

## 2017-10-25 DIAGNOSIS — R55 Syncope and collapse: Secondary | ICD-10-CM | POA: Diagnosis not present

## 2017-10-25 HISTORY — DX: Syncope and collapse: R55

## 2017-10-25 MED ORDER — ALPRAZOLAM 0.5 MG PO TABS: ORAL_TABLET | ORAL | 0 refills | Status: DC

## 2017-10-25 NOTE — Patient Instructions (Signed)
   We will get MRI of the brain and get an EEG study. 

## 2017-10-25 NOTE — Progress Notes (Signed)
Reason for visit: Black out, possible seizure  Referring physician: Dr. Lamonte Richer I Christine Christensen is a 72 y.o. female  History of present illness:  Christine Christensen is a 72 year old right-handed black female with a history of a recent blackout event that occurred in November 2018.  The patient apparently was at home at the time, she was in the kitchen and she had just stooped over to open up the oven.  The patient began feeling hot and sweaty, the patient suddenly lost consciousness.  Her son heard her hit the floor, and checked on her.  The patient apparently was not jerking or twitching, she did not bite her tongue or lose control of the bowels or the bladder.  She was diaphoretic.  She denies any nausea following the event.  EMS was called and came in to check on the patient.  The patient comes into the office today by herself, her son is not with her.  The patient indicates that there has been two other blackout events, the first event was 2 or 3 years ago.  The patient cannot remember exactly when these episodes occurred.  The patient has not had any further blackouts since the one in November 2018.  She denied any palpitations of the heart or chest pain with the events.  She reports no new numbness or weakness of the face, arms, or legs.  The patient has suffered strokes in the past in 2003 and around 2008 with a residual right hemiparesis.  The patient has had a left total knee replacement in November as well.  She is sent to this office for further evaluation.  Past Medical History:  Diagnosis Date  . Anemia    history of anemia  . Aortic stenosis    mild aortic stenosis 07/27/17 echo  . Arthritis   . Heart murmur   . Hypertension   . Mitral stenosis    moderate mitral stenosis 07/27/17 echo  . Stroke [facility])    stroke x2, 2005 and 2008, some right sided weakness  . Syncope and collapse 10/25/2017  . Urinary incontinence     Past Surgical History:  Procedure Laterality Date  . CATARACT  EXTRACTION, BILATERAL Bilateral   . CHOLECYSTECTOMY    . TONSILLECTOMY    . TOTAL KNEE ARTHROPLASTY Right    right knee 2006  . TOTAL KNEE ARTHROPLASTY Left 08/06/2017   Procedure: LEFT TOTAL KNEE ARTHROPLASTY;  Surgeon: Vickey Huger, MD;  Location: [place];  Service: Orthopedics;  Laterality: Left;  . TUBAL LIGATION      Family History  Problem Relation Age of Onset  . Dementia Mother   . Heart disease Father   . Cerebral palsy Sister     Social history:  reports that she has been smoking cigarettes.  She has a 28.00 pack-year smoking history. she has never used smokeless tobacco. She reports that she drinks alcohol. She reports that she uses drugs. Drug: Marijuana.  Medications:  Prior to Admission medications   Medication Sig Start Date End Date Taking? Authorizing Provider  aspirin EC 325 MG EC tablet Take 1 tablet (325 mg total) 2 (two) times daily by mouth. 08/07/17  Yes Donia Ast, PA  Cholecalciferol (VITAMIN D3 PO) Take 1 capsule daily by mouth.   Yes [provider]  escitalopram (LEXAPRO) 20 MG tablet Take 20 mg daily by mouth.    Yes [provider]  ALPRAZolam Duanne Moron) 0.5 MG tablet Take 2 tablets approximately 45 minutes prior to  the MRI study, take a third tablet if needed. 10/25/17   Kathrynn Ducking, MD  methocarbamol (ROBAXIN) 500 MG tablet Take 1-2 tablets (500-1,000 mg total) every 6 (six) hours as needed by mouth for muscle spasms. Patient not taking: Reported on 10/25/2017 08/07/17   Donia Ast, PA  oxyCODONE (OXY IR/ROXICODONE) 5 MG immediate release tablet Take 1-2 tablets (5-10 mg total) every 4 (four) hours as needed by mouth for moderate pain ((score 4 to 6)). Patient not taking: Reported on 10/25/2017 08/07/17   Donia Ast, PA     No Known Allergies  ROS:  Out of a complete 14 system review of symptoms, the patient complains only of the following symptoms, and all other reviewed systems are negative.  Blackout  event  Blood pressure 118/78, pulse 82, height 5' 3.5" (1.613 m), weight 159 lb (72.1 kg), SpO2 98 %.  Physical Exam  General: The patient is alert and cooperative at the time of the examination.  Eyes: Pupils are equal, round, and reactive to light. Discs are flat bilaterally.  Neck: The neck is supple, no carotid bruits are noted.  Respiratory: The respiratory examination is clear.  Cardiovascular: The cardiovascular examination reveals a regular rate and rhythm, no obvious murmurs or rubs are noted.  Skin: Extremities are without significant edema.  Neurologic Exam  Mental status: The patient is alert and oriented x 3 at the time of the examination. The patient has apparent normal recent and remote memory, with an apparently normal attention span and concentration ability.  Cranial nerves: Facial symmetry is present. There is good sensation of the face to pinprick and soft touch bilaterally. The strength of the facial muscles and the muscles to head turning and shoulder shrug are normal bilaterally. Speech is well enunciated, no aphasia or dysarthria is noted. Extraocular movements are full. Visual fields are full. The tongue is midline, and the patient has symmetric elevation of the soft palate. No obvious hearing deficits are noted.  Motor: The motor testing reveals 5 over 5 strength of all 4 extremities. Good symmetric motor tone is noted throughout.  Sensory: Sensory testing is intact to pinprick, soft touch, vibration sensation, and position sense on all 4 extremities. No evidence of extinction is noted.  Coordination: Cerebellar testing reveals good finger-nose-finger and heel-to-shin bilaterally.  Gait and station: Gait is slightly wide-based with a circumduction type gait with the right leg.  Tandem gait was not attempted.  Romberg is negative.  Reflexes: Deep tendon reflexes are symmetric and normal bilaterally. Toes are downgoing on the left, upgoing on the  right.   Assessment/Plan:  1.  Cerebrovascular disease, right hemiparesis  2.  Syncopal event, possible vasovagal syncope  The patient reports feeling hot, flushed, and diaphoretic prior to the blackout.  The patient claims that she did not bite her tongue or lose control of the bowels or the bladder, she was not jerking when the son found her.  Unfortunately, she comes in today by herself, her son is not with her.  The event may represent a vasovagal syncopal event.  The patient does have a prior history of stroke and may be at higher risk of seizures therefore.  The patient will undergo MRI of the brain, she will have a an EEG study done.  She will follow-up in 6 months, she is to call if another episode is noted.  Jill Alexanders MD 10/25/2017 2:35 PM  [facility] [address] [place], [place] [address]  Phone [phone] Fax [phone]  

## 2017-11-06 ENCOUNTER — Ambulatory Visit
Admission: RE | Admit: 2017-11-06 | Discharge: 2017-11-06 | Disposition: A | Discharge status: Home / Self Care | Payer: PPO | Source: Ambulatory Visit | Attending: Neurology | Admitting: Neurology

## 2017-11-06 DIAGNOSIS — R55 Syncope and collapse: Secondary | ICD-10-CM

## 2017-11-07 ENCOUNTER — Telehealth: Payer: Self-pay | Admitting: Neurology

## 2017-11-07 NOTE — Telephone Encounter (Signed)
I called the patient.  MRI of the brain shows at least a moderate level of small vessel ischemic changes in the cortex of the brain, but the patient does have a lot of ischemia to the cerebellum and brainstem area.  Given the history of syncope, may consider a CT angiogram of the head and neck to look at the vertebral and basilar circulation systems.  If the patient is amenable to having the study done, she is to contact our office and we will get the study done.  An EEG study is pending.   MRI brain 11/07/17:  IMPRESSION:  Abnormal MRI brain (without) demonstrating: 1. Chronic lacunar infarctions in the bilateral pons, left anterior medulla and right cerebellum. Chronic cortical ischemic infarctions in the bifrontal regions.  2. Mild periventricular and subcortical chronic small vessel ischemic disease. 3. No acute findings. 4. Chronic ischemic disease has progressed since MRI on 12/05/06.

## 2017-11-08 ENCOUNTER — Ambulatory Visit (INDEPENDENT_AMBULATORY_CARE_PROVIDER_SITE_OTHER): Payer: PPO | Admitting: Neurology

## 2017-11-08 ENCOUNTER — Telehealth: Payer: Self-pay | Admitting: Neurology

## 2017-11-08 DIAGNOSIS — R55 Syncope and collapse: Secondary | ICD-10-CM

## 2017-11-08 NOTE — Procedures (Signed)
    History:  Christine Christensen is a 72 year old patient with a history of a blackout event that occurred in November 2018.  The patient had stooped over to open up the oven and began feeling hot and sweaty, she suddenly lost consciousness.  The patient was diaphoretic with the event.  The patient has had 2 other blackout events within the last 2-3 years.  The patient is being evaluated for these episodes.  This is a routine EEG.  No skull defects are noted.  Medications include aspirin, vitamin D, Xanax, Robaxin, and oxycodone.  EEG classification: Normal awake  Description of the recording: The background rhythms of this recording consists of a fairly well modulated medium amplitude alpha rhythm of 8 Hz that is reactive to eye opening and closure. As the record progresses, the patient appears to remain in the waking state throughout the recording. Photic stimulation was performed, resulting in a bilateral and symmetric photic driving response. Hyperventilation was not performed. At no time during the recording does there appear to be evidence of spike or spike wave discharges or evidence of focal slowing. EKG monitor shows no evidence of cardiac rhythm abnormalities with a heart rate of 90.  Impression: This is a normal EEG recording in the waking state. No evidence of ictal or interictal discharges are seen.

## 2017-11-08 NOTE — Telephone Encounter (Signed)
I called the patient.  The EEG study was unremarkable.  The patient will be undergoing a CT angiogram given the pattern of strokes by MRI revealed quite a bit of involvement of the posterior circulation.  We will need to rule out vertebrobasilar insufficiency as an etiology for her syncope.

## 2017-11-20 DIAGNOSIS — Z471 Aftercare following joint replacement surgery: Secondary | ICD-10-CM | POA: Diagnosis not present

## 2017-11-20 DIAGNOSIS — Z96652 Presence of left artificial knee joint: Secondary | ICD-10-CM | POA: Diagnosis not present

## 2017-11-27 ENCOUNTER — Telehealth: Payer: Self-pay | Admitting: Neurology

## 2017-11-27 NOTE — Addendum Note (Signed)
Addended by: Kathrynn Ducking on: 11/27/2017 01:02 PM   Modules accepted: Orders

## 2017-11-27 NOTE — Telephone Encounter (Signed)
Pt has called re: the MRI that she had done on 02-19, she states she has not heard anything about the results of that.  Please call when results of the MRI are available

## 2017-11-27 NOTE — Telephone Encounter (Signed)
I called the patient.  I discussed the results of the MRI, there is extensive involvement of the brainstem and cerebellum with mini strokes, but the patient also has periventricular changes as well.  The patient has a history of blackouts, may consider CT angiogram of the head and neck to exclude vertebrobasilar insufficiency.  The patient is amenable to this.  I will get the study set up.

## 2017-11-27 NOTE — Telephone Encounter (Signed)
11/27/17 health team faxed clinical notes order sent to [facility]

## 2017-11-28 NOTE — Telephone Encounter (Signed)
Health team auth: [id number]. 11/27/17 to 02/25/18)

## 2017-12-18 DIAGNOSIS — H11153 Pinguecula, bilateral: Secondary | ICD-10-CM | POA: Diagnosis not present

## 2017-12-18 DIAGNOSIS — H524 Presbyopia: Secondary | ICD-10-CM | POA: Diagnosis not present

## 2017-12-18 DIAGNOSIS — H52223 Regular astigmatism, bilateral: Secondary | ICD-10-CM | POA: Diagnosis not present

## 2017-12-18 DIAGNOSIS — H40023 Open angle with borderline findings, high risk, bilateral: Secondary | ICD-10-CM | POA: Diagnosis not present

## 2017-12-18 DIAGNOSIS — H3589 Other specified retinal disorders: Secondary | ICD-10-CM | POA: Diagnosis not present

## 2017-12-18 DIAGNOSIS — H5203 Hypermetropia, bilateral: Secondary | ICD-10-CM | POA: Diagnosis not present

## 2017-12-18 DIAGNOSIS — H18413 Arcus senilis, bilateral: Secondary | ICD-10-CM | POA: Diagnosis not present

## 2017-12-18 DIAGNOSIS — H40003 Preglaucoma, unspecified, bilateral: Secondary | ICD-10-CM | POA: Diagnosis not present

## 2017-12-18 DIAGNOSIS — H11423 Conjunctival edema, bilateral: Secondary | ICD-10-CM | POA: Diagnosis not present

## 2017-12-18 DIAGNOSIS — Z961 Presence of intraocular lens: Secondary | ICD-10-CM | POA: Diagnosis not present

## 2017-12-20 ENCOUNTER — Ambulatory Visit
Admission: RE | Admit: 2017-12-20 | Discharge: 2017-12-20 | Disposition: A | Discharge status: Home / Self Care | Payer: PPO | Source: Ambulatory Visit | Attending: Neurology | Admitting: Neurology

## 2017-12-20 ENCOUNTER — Telehealth: Payer: Self-pay | Admitting: Neurology

## 2017-12-20 DIAGNOSIS — R55 Syncope and collapse: Secondary | ICD-10-CM

## 2017-12-20 DIAGNOSIS — R413 Other amnesia: Secondary | ICD-10-CM | POA: Diagnosis not present

## 2017-12-20 MED ORDER — IOPAMIDOL (ISOVUE-370) INJECTION 76%
75.0000 mL | Freq: Once | INTRAVENOUS | Status: AC | PRN
  Administered 2017-12-20: 75 mL via INTRAVENOUS

## 2017-12-20 NOTE — Telephone Encounter (Signed)
  I called the patient.  The CT angiogram of the head and neck was relatively unremarkable, no severe stenosis of blood vessels, no evidence of vertebrobasilar disease that would result in syncope.  Patient does have small vessel disease by the CT of the brain.  Patient will call me if she has any further events.  CT angiogram head and neck December 20, 2017:  IMPRESSION: 1. Bilateral ICA origin and bulb atherosclerosis without hemodynamically significant stenosis. 2. Bilateral ICA siphon calcified plaque with mild left and up to moderate right siphon stenosis. 3. No vertebrobasilar stenosis. Minimal basilar artery calcified plaque. The right vertebral artery is dominant and the left functionally terminates in PICA. 4. No anterior or posterior circulation branch stenosis. 5. No acute intracranial abnormality. Advanced chronic small vessel disease, and evidence of a small chronic right ACA territory infarct, appears stable from the February brain MRI. 6. Chronic upper lobe lung disease with Emphysema (ICD10-J43.9) and architectural distortion.

## 2018-04-23 DIAGNOSIS — F419 Anxiety disorder, unspecified: Secondary | ICD-10-CM | POA: Diagnosis not present

## 2018-04-23 DIAGNOSIS — Z131 Encounter for screening for diabetes mellitus: Secondary | ICD-10-CM | POA: Diagnosis not present

## 2018-04-23 DIAGNOSIS — Z13220 Encounter for screening for lipoid disorders: Secondary | ICD-10-CM | POA: Diagnosis not present

## 2018-04-23 DIAGNOSIS — Z0000 Encounter for general adult medical examination without abnormal findings: Secondary | ICD-10-CM | POA: Diagnosis not present

## 2018-04-23 DIAGNOSIS — J302 Other seasonal allergic rhinitis: Secondary | ICD-10-CM | POA: Diagnosis not present

## 2018-05-13 ENCOUNTER — Encounter: Payer: Self-pay | Admitting: Adult Health

## 2018-05-13 ENCOUNTER — Ambulatory Visit: Payer: Self-pay | Admitting: Adult Health

## 2018-05-13 ENCOUNTER — Ambulatory Visit (INDEPENDENT_AMBULATORY_CARE_PROVIDER_SITE_OTHER): Payer: PPO | Admitting: Adult Health

## 2018-05-13 VITALS — BP 112/70 | HR 70 | Ht 63.5 in | Wt 161.2 lb

## 2018-05-13 DIAGNOSIS — R55 Syncope and collapse: Secondary | ICD-10-CM | POA: Diagnosis not present

## 2018-05-13 NOTE — Patient Instructions (Signed)
Your Plan:  Continue to monitor symptoms If your symptoms worsen or you develop new symptoms please let us know.   Thank you for coming to see us at [facility]. I hope we have been able to provide you high quality care today.  You may receive a patient satisfaction survey over the next few weeks. We would appreciate your feedback and comments so that we may continue to improve ourselves and the health of our patients.   

## 2018-05-13 NOTE — Progress Notes (Signed)
I have read the note, and I agree with the clinical assessment and plan.  Charles K Willis   

## 2018-05-13 NOTE — Progress Notes (Signed)
PATIENT: Christine Christensen DOB: 1946/04/14  REASON FOR VISIT: follow up HISTORY FROM: patient  HISTORY OF PRESENT ILLNESS: Today 05/13/18: Christine Christensen is a 72 year old female with a history of syncopal event.  She returns today for follow-up.  The patient had an MRI of the brain, CTA of the head and neck and an EEG all of which was relatively unremarkable.  The MRI of the brain did show some changes but nothing to explain her symptoms.  The patient reports that she has not had any additional syncopal events.  She states that she may have been dehydrated.  She denies any new neurological symptoms.  She returns today for evaluation.  HISTORY Christine Christensen is a 71 year old right-handed black female with a history of a recent blackout event that occurred in November 2018.  The patient apparently was at home at the time, she was in the kitchen and she had just stooped over to open up the oven.  The patient began feeling hot and sweaty, the patient suddenly lost consciousness.  Her son heard her hit the floor, and checked on her.  The patient apparently was not jerking or twitching, she did not bite her tongue or lose control of the bowels or the bladder.  She was diaphoretic.  She denies any nausea following the event.  EMS was called and came in to check on the patient.  The patient comes into the office today by herself, her son is not with her.  The patient indicates that there has been two other blackout events, the first event was 2 or 3 years ago.  The patient cannot remember exactly when these episodes occurred.  The patient has not had any further blackouts since the one in November 2018.  She denied any palpitations of the heart or chest pain with the events.  She reports no new numbness or weakness of the face, arms, or legs.  The patient has suffered strokes in the past in 2003 and around 2008 with a residual right hemiparesis.  The patient has had a left total knee replacement in November as well.  She  is sent to this office for further evaluation.   REVIEW OF SYSTEMS: Out of a complete 14 system review of symptoms, the patient complains only of the following symptoms, and all other reviewed systems are negative.  See HPI  ALLERGIES: No Known Allergies  HOME MEDICATIONS: Outpatient Medications Prior to Visit  Medication Sig Dispense Refill  . ALPRAZolam (XANAX) 0.5 MG tablet Take 2 tablets approximately 45 minutes prior to the MRI study, take a third tablet if needed. 3 tablet 0  . aspirin EC 325 MG EC tablet Take 1 tablet (325 mg total) 2 (two) times daily by mouth. 30 tablet 0  . Cholecalciferol (VITAMIN D3 PO) Take 1 capsule daily by mouth.    . escitalopram (LEXAPRO) 20 MG tablet Take 20 mg daily by mouth.     . methocarbamol (ROBAXIN) 500 MG tablet Take 1-2 tablets (500-1,000 mg total) every 6 (six) hours as needed by mouth for muscle spasms. (Patient not taking: Reported on 10/25/2017) 60 tablet 0  . oxyCODONE (OXY IR/ROXICODONE) 5 MG immediate release tablet Take 1-2 tablets (5-10 mg total) every 4 (four) hours as needed by mouth for moderate pain ((score 4 to 6)). (Patient not taking: Reported on 10/25/2017) 40 tablet 0   No facility-administered medications prior to visit.     PAST MEDICAL HISTORY: Past Medical History:  Diagnosis Date  . Anemia  history of anemia  . Aortic stenosis    mild aortic stenosis 07/27/17 echo  . Arthritis   . Heart murmur   . Hypertension   . Mitral stenosis    moderate mitral stenosis 07/27/17 echo  . Stroke [facility])    stroke x2, 2005 and 2008, some right sided weakness  . Syncope and collapse 10/25/2017  . Urinary incontinence     PAST SURGICAL HISTORY: Past Surgical History:  Procedure Laterality Date  . CATARACT EXTRACTION, BILATERAL Bilateral   . CHOLECYSTECTOMY    . TONSILLECTOMY    . TOTAL KNEE ARTHROPLASTY Right    right knee 2006  . TOTAL KNEE ARTHROPLASTY Left 08/06/2017   Procedure: LEFT TOTAL KNEE ARTHROPLASTY;  Surgeon:  Vickey Huger, MD;  Location: [place];  Service: Orthopedics;  Laterality: Left;  . TUBAL LIGATION      FAMILY HISTORY: Family History  Problem Relation Age of Onset  . Dementia Mother   . Heart disease Father   . Cerebral palsy Sister     SOCIAL HISTORY: Social History   Socioeconomic History  . Marital status: Divorced    Spouse name: Not on file  . Number of children: 2  . Years of education: 24  . Highest education level: Not on file  Occupational History  . Not on file  Social Needs  . Financial resource strain: Not on file  . Food insecurity:    Worry: Not on file    Inability: Not on file  . Transportation needs:    Medical: Not on file    Non-medical: Not on file  Tobacco Use  . Smoking status: Current Every Day Smoker    Packs/day: 0.50    Years: 56.00    Pack years: 28.00    Types: Cigarettes  . Smokeless tobacco: Never Used  Substance and Sexual Activity  . Alcohol use: Yes    Comment: Sometimes  . Drug use: Yes    Types: Marijuana  . Sexual activity: Not on file  Lifestyle  . Physical activity:    Days per week: Not on file    Minutes per session: Not on file  . Stress: Not on file  Relationships  . Social connections:    Talks on phone: Not on file    Gets together: Not on file    Attends religious service: Not on file    Active member of club or organization: Not on file    Attends meetings of clubs or organizations: Not on file    Relationship status: Not on file  . Intimate partner violence:    Fear of current or ex partner: Not on file    Emotionally abused: Not on file    Physically abused: Not on file    Forced sexual activity: Not on file  Other Topics Concern  . Not on file  Social History Narrative   Lives with son   Caffeine use: Drinks coffee 3 times weekly, Iced tea sometimes   Right handed       PHYSICAL EXAM  Vitals:   05/13/18 1357  Height: 5' 3.5" (1.613 m)   Body mass index is 27.72 kg/m.  Generalized: Well  developed, in no acute distress   Neurological examination  Mentation: Alert oriented to time, place, history taking. Follows all commands speech and language fluent Cranial nerve II-XII: Pupils were equal round reactive to light. Extraocular movements were full, visual field were full on confrontational test. Facial sensation and strength were normal. Uvula tongue  midline. Head turning and shoulder shrug  were normal and symmetric. Motor: The motor testing reveals 5 over 5 strength of all 4 extremities. Good symmetric motor tone is noted throughout.  Sensory: Sensory testing is intact to soft touch on all 4 extremities. No evidence of extinction is noted.  Coordination: Cerebellar testing reveals good finger-nose-finger and heel-to-shin bilaterally.  Gait and station: Patient uses a cane when ambulating.   DIAGNOSTIC DATA (LABS, IMAGING, TESTING) - I reviewed patient records, labs, notes, testing and imaging myself where available.  Lab Results  Component Value Date   WBC 10.3 08/08/2017   HGB 11.6 (L) 08/08/2017   HCT 35.2 (L) 08/08/2017   MCV 88.0 08/08/2017   PLT 180 08/08/2017      Component Value Date/Time   NA 137 08/07/2017 0506   K 3.7 08/07/2017 0506   CL 102 08/07/2017 0506   CO2 28 08/07/2017 0506   GLUCOSE 97 08/07/2017 0506   BUN 8 08/07/2017 0506   CREATININE 0.68 08/07/2017 0506   CALCIUM 9.1 08/07/2017 0506   PROT 6.6 07/26/2017 1447   ALBUMIN 3.4 (L) 07/26/2017 1447   AST 14 (L) 07/26/2017 1447   ALT 10 (L) 07/26/2017 1447   ALKPHOS 63 07/26/2017 1447   BILITOT 0.4 07/26/2017 1447   GFRNONAA >60 08/07/2017 0506   GFRAA >60 08/07/2017 0506      ASSESSMENT AND PLAN 72 y.o. year old female  has a past medical history of Anemia, Aortic stenosis, Arthritis, Heart murmur, Hypertension, Mitral stenosis, Stroke ([place]), Syncope and collapse (10/25/2017), and Urinary incontinence. here with:  1.  Syncope  The patient has not had any additional syncopal events.   We will continue to monitor symptoms.  She is advised that she has any additional events she should let us know.  As the patient was leaving she mentions that she has a family history of Alzheimer's disease and she has noticed some trouble with her memory.  Advised that she should follow-up with her primary care for work-up.  If needed she can make an additional appointment with our office to address her memory concerns.  She voiced understanding.  She will follow-up with our office as needed.   I spent 15 minutes with the patient. 50% of this time was spent discussing plan of care   Ward Givens, MSN, NP-C 05/13/2018, 1:58 PM [facility] [address], [place], [place] [address] [phone]

## 2018-06-27 DIAGNOSIS — H11153 Pinguecula, bilateral: Secondary | ICD-10-CM | POA: Diagnosis not present

## 2018-06-27 DIAGNOSIS — H0102A Squamous blepharitis right eye, upper and lower eyelids: Secondary | ICD-10-CM | POA: Diagnosis not present

## 2018-06-27 DIAGNOSIS — Z961 Presence of intraocular lens: Secondary | ICD-10-CM | POA: Diagnosis not present

## 2018-06-27 DIAGNOSIS — H40013 Open angle with borderline findings, low risk, bilateral: Secondary | ICD-10-CM | POA: Diagnosis not present

## 2018-06-27 DIAGNOSIS — H18413 Arcus senilis, bilateral: Secondary | ICD-10-CM | POA: Diagnosis not present

## 2018-06-27 DIAGNOSIS — H0102B Squamous blepharitis left eye, upper and lower eyelids: Secondary | ICD-10-CM | POA: Diagnosis not present

## 2018-06-27 DIAGNOSIS — H04123 Dry eye syndrome of bilateral lacrimal glands: Secondary | ICD-10-CM | POA: Diagnosis not present

## 2018-06-27 DIAGNOSIS — H40023 Open angle with borderline findings, high risk, bilateral: Secondary | ICD-10-CM | POA: Diagnosis not present

## 2018-07-27 ENCOUNTER — Other Ambulatory Visit: Payer: Self-pay

## 2018-07-27 ENCOUNTER — Ambulatory Visit (HOSPITAL_COMMUNITY)
Admission: EM | Admit: 2018-07-27 | Discharge: 2018-07-27 | Disposition: A | Discharge status: Home / Self Care | Payer: PPO | Source: Home / Self Care

## 2018-07-27 ENCOUNTER — Emergency Department (HOSPITAL_COMMUNITY)
Admission: EM | Admit: 2018-07-27 | Discharge: 2018-07-27 | Disposition: A | Discharge status: Home / Self Care | Payer: PPO | Attending: Emergency Medicine | Admitting: Emergency Medicine

## 2018-07-27 ENCOUNTER — Encounter (HOSPITAL_COMMUNITY): Payer: Self-pay

## 2018-07-27 ENCOUNTER — Emergency Department (HOSPITAL_COMMUNITY): Payer: PPO

## 2018-07-27 DIAGNOSIS — Y9389 Activity, other specified: Secondary | ICD-10-CM | POA: Insufficient documentation

## 2018-07-27 DIAGNOSIS — Y998 Other external cause status: Secondary | ICD-10-CM | POA: Insufficient documentation

## 2018-07-27 DIAGNOSIS — Z96653 Presence of artificial knee joint, bilateral: Secondary | ICD-10-CM | POA: Diagnosis not present

## 2018-07-27 DIAGNOSIS — W01198A Fall on same level from slipping, tripping and stumbling with subsequent striking against other object, initial encounter: Secondary | ICD-10-CM | POA: Diagnosis not present

## 2018-07-27 DIAGNOSIS — Y9222 Religious institution as the place of occurrence of the external cause: Secondary | ICD-10-CM | POA: Diagnosis not present

## 2018-07-27 DIAGNOSIS — I10 Essential (primary) hypertension: Secondary | ICD-10-CM | POA: Insufficient documentation

## 2018-07-27 DIAGNOSIS — S0990XA Unspecified injury of head, initial encounter: Secondary | ICD-10-CM

## 2018-07-27 DIAGNOSIS — Z8673 Personal history of transient ischemic attack (TIA), and cerebral infarction without residual deficits: Secondary | ICD-10-CM | POA: Insufficient documentation

## 2018-07-27 DIAGNOSIS — Z7982 Long term (current) use of aspirin: Secondary | ICD-10-CM | POA: Insufficient documentation

## 2018-07-27 DIAGNOSIS — R55 Syncope and collapse: Secondary | ICD-10-CM | POA: Diagnosis not present

## 2018-07-27 DIAGNOSIS — W19XXXA Unspecified fall, initial encounter: Secondary | ICD-10-CM

## 2018-07-27 DIAGNOSIS — F17210 Nicotine dependence, cigarettes, uncomplicated: Secondary | ICD-10-CM | POA: Diagnosis not present

## 2018-07-27 DIAGNOSIS — Z9049 Acquired absence of other specified parts of digestive tract: Secondary | ICD-10-CM | POA: Diagnosis not present

## 2018-07-27 DIAGNOSIS — Z79899 Other long term (current) drug therapy: Secondary | ICD-10-CM | POA: Insufficient documentation

## 2018-07-27 LAB — CBC WITH DIFFERENTIAL/PLATELET
Abs Immature Granulocytes: 0.02 10*3/uL (ref 0.00–0.07)
BASOS ABS: 0 10*3/uL (ref 0.0–0.1)
BASOS PCT: 1 %
EOS ABS: 0.1 10*3/uL (ref 0.0–0.5)
EOS PCT: 2 %
HCT: 40.6 % (ref 36.0–46.0)
Hemoglobin: 13.2 g/dL (ref 12.0–15.0)
IMMATURE GRANULOCYTES: 0 %
Lymphocytes Relative: 36 %
Lymphs Abs: 2.4 10*3/uL (ref 0.7–4.0)
MCH: 29.3 pg (ref 26.0–34.0)
MCHC: 32.5 g/dL (ref 30.0–36.0)
MCV: 90.2 fL (ref 80.0–100.0)
Monocytes Absolute: 0.5 10*3/uL (ref 0.1–1.0)
Monocytes Relative: 7 %
NEUTROS PCT: 54 %
NRBC: 0 % (ref 0.0–0.2)
Neutro Abs: 3.6 10*3/uL (ref 1.7–7.7)
PLATELETS: 190 10*3/uL (ref 150–400)
RBC: 4.5 MIL/uL (ref 3.87–5.11)
RDW: 14.9 % (ref 11.5–15.5)
WBC: 6.6 10*3/uL (ref 4.0–10.5)

## 2018-07-27 LAB — BASIC METABOLIC PANEL
ANION GAP: 8 (ref 5–15)
BUN: 9 mg/dL (ref 8–23)
CALCIUM: 9.1 mg/dL (ref 8.9–10.3)
CO2: 25 mmol/L (ref 22–32)
Chloride: 105 mmol/L (ref 98–111)
Creatinine, Ser: 0.8 mg/dL (ref 0.44–1.00)
GFR calc Af Amer: >60 mL/min (ref >60)
Glucose, Bld: 96 mg/dL (ref 70–99)
Potassium: 3.6 mmol/L (ref 3.5–5.1)
SODIUM: 138 mmol/L (ref 135–145)

## 2018-07-27 LAB — URINALYSIS, ROUTINE W REFLEX MICROSCOPIC
BILIRUBIN URINE: NEGATIVE
Glucose, UA: NEGATIVE mg/dL
Hgb urine dipstick: NEGATIVE
Ketones, ur: NEGATIVE mg/dL
Leukocytes, UA: NEGATIVE
NITRITE: NEGATIVE
PH: 5 (ref 5.0–8.0)
Protein, ur: NEGATIVE mg/dL
SPECIFIC GRAVITY, URINE: 1.01 (ref 1.005–1.030)

## 2018-07-27 LAB — CBG MONITORING, ED: GLUCOSE-CAPILLARY: 88 mg/dL (ref 70–99)

## 2018-07-27 LAB — I-STAT TROPONIN, ED: Troponin i, poc: 0.01 ng/mL (ref 0.00–0.08)

## 2018-07-27 MED ORDER — SODIUM CHLORIDE 0.9 % IV SOLN: INTRAVENOUS | Status: DC

## 2018-07-27 MED ORDER — SODIUM CHLORIDE 0.9 % IV BOLUS
1000.0000 mL | Freq: Once | INTRAVENOUS | Status: AC
  Administered 2018-07-27: 1000 mL via INTRAVENOUS

## 2018-07-27 NOTE — Discharge Instructions (Signed)
You have been evaluated for your fall.  Fortunately no broken bone or significant head injury were noted.  Your labs and EKG are reassuring.  Please take over the counter tylenol as needed for headache.  Follow up with your doctor for further care.  Return promptly if your condition worsen or if you have other concerns.

## 2018-07-27 NOTE — ED Triage Notes (Signed)
Pt presents to the ED from urgent care. Pt states she was at choir rehearsal and fell and hit her head. Pt is unsure how she fell. Pt does have an abrasion on the left side of her head. Pt also reports "floaters" in her right eye.

## 2018-07-27 NOTE — ED Provider Notes (Signed)
[facility] EMERGENCY DEPARTMENT Provider Note   CSN: [id number] Arrival date & time: 07/27/18  1317     History   Chief Complaint Chief Complaint  Patient presents with  . Loss of Consciousness  . Fall    HPI Christine Christensen is a 72 y.o. female.  The history is provided by medical records. No language interpreter was used.  Loss of Consciousness    Fall      72 year old female with history of hypertension, anemia, aortic stenosis, prior stroke presenting for evaluation of a syncopal episode.  Patient mention she was at church this morning and when she was walking up to the.  The next thing she knows she was on the ground with an apparent syncopal episode.  She denies any precipitating symptoms prior to the fall, denies tripping on anything.  She struck her forehead against the ground and does complain of pain to the left side of her forehead and temporal region.  Pain is sharp, throbbing, moderate in severity.  She denies any significant neck pain, no chest pain, trouble breathing, abdominal pain, back pain or pain to extremities, no focal numbness or weakness.  She does not normally eat breakfast.  She denies any recent medication changes.  Denies alcohol or drug use.  She mentioned that she has history of recurrent falls secondary to her prior stroke.  Aside from taking aspirin she is not on any anticoagulant.  Past Medical History:  Diagnosis Date  . Anemia    history of anemia  . Aortic stenosis    mild aortic stenosis 07/27/17 echo  . Arthritis   . Heart murmur   . Hypertension   . Mitral stenosis    moderate mitral stenosis 07/27/17 echo  . Stroke [facility])    stroke x2, 2005 and 2008, some right sided weakness  . Syncope and collapse 10/25/2017  . Urinary incontinence     Patient Active Problem List   Diagnosis Date Noted  . Syncope and collapse 10/25/2017  . 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 CATARACT EXTRACTION, BILATERAL Bilateral   . CHOLECYSTECTOMY    . TONSILLECTOMY    . TOTAL KNEE ARTHROPLASTY Right    right knee 2006  . TOTAL KNEE ARTHROPLASTY Left 08/06/2017   Procedure: LEFT TOTAL KNEE ARTHROPLASTY;  Surgeon: Vickey Huger, MD;  Location: [place];  Service: Orthopedics;  Laterality: Left;  . TUBAL LIGATION       OB History   None      Home Medications    Prior to Admission medications   Medication Sig Start Date End Date Taking? Authorizing Provider  aspirin EC 325 MG EC tablet Take 1 tablet (325 mg total) 2 (two) times daily by mouth. 08/07/17   Donia Ast, PA  cholecalciferol (VITAMIN D) 1000 units tablet Take 1,000 Units by mouth daily.    [provider]  cromolyn (OPTICROM) 4 % ophthalmic solution PLACE 1 DROP INTO BOTH EYES 2 TO 4 TIMES A DAY 03/19/18   [provider]  escitalopram (LEXAPRO) 20 MG tablet Take 20 mg daily by mouth.     [provider]    Family History Family History  Problem Relation Age of Onset  . Dementia Mother   . Heart disease Father   . Cerebral palsy Sister     Social History Social History   Tobacco Use  . Smoking status: Current Every Day Smoker  Packs/day: 0.50    Years: 56.00    Pack years: 28.00    Types: Cigarettes  . Smokeless tobacco: Never Used  Substance Use Topics  . Alcohol use: Yes    Comment: Sometimes  . Drug use: Yes    Types: Marijuana     Allergies   Patient has no known allergies.   Review of Systems Review of Systems  Cardiovascular: Positive for syncope.  All other systems reviewed and are negative.    Physical Exam Updated Vital Signs There were no vitals taken for this visit.  Physical Exam  Constitutional: She is oriented to person, place, and time. She appears well-developed and well-nourished. No distress.  HENT:  Head: Normocephalic.  Ecchymosis noted to the left lateral eyebrow with small amount of dried blood noted but no  deep laceration.  Abrasion noted to left temporal region, no crepitus. No hemotympanum, no septal hematoma, no malocclusion, no midface tenderness, no raccoon's eyes, no battle sign.  Eyes: Conjunctivae are normal.  Neck: Normal range of motion. Neck supple.  Cardiovascular: Normal rate and regular rhythm.  Pulmonary/Chest: Effort normal and breath sounds normal.  Abdominal: Soft. Bowel sounds are normal. She exhibits no distension. There is no tenderness.  Neurological: She is alert and oriented to person, place, and time. She has normal strength. No cranial nerve deficit or sensory deficit. GCS eye subscore is 4. GCS verbal subscore is 5. GCS motor subscore is 6.  Skin: No rash noted.  Psychiatric: She has a normal mood and affect.  Nursing note and vitals reviewed.    ED Treatments / Results  Labs (all labs ordered are listed, but only abnormal results are displayed) Labs Reviewed  BASIC METABOLIC PANEL  CBC WITH DIFFERENTIAL/PLATELET  URINALYSIS, ROUTINE W REFLEX MICROSCOPIC  CBG MONITORING, ED  I-STAT TROPONIN, ED    EKG EKG Interpretation  Date/Time:  Saturday July 27 2018 13:28:28 EST Ventricular Rate:  61 PR Interval:    QRS Duration: 76 QT Interval:  464 QTC Calculation: 468 R Axis:   104 Text Interpretation:  Sinus rhythm Atrial premature complex Probable left atrial enlargement Probable anteroseptal infarct, recent no significant change since 2008 Confirmed by Sherwood Gambler [phone]) on 07/27/2018 3:08:12 PM   Radiology Ct Head Wo Contrast  Result Date: 07/27/2018 CLINICAL DATA:  [place] striking the head. EXAM: CT HEAD WITHOUT CONTRAST TECHNIQUE: Contiguous axial images were obtained from the base of the skull through the vertex without intravenous contrast. COMPARISON:  12/20/2017 FINDINGS: Brain: Old small vessel infarctions affect the pons. Old small vessel infarctions affect the cerebellum. Cerebral hemispheres show chronic small-vessel ischemic changes of the  deep and subcortical white matter. There is an old right frontal infarction. No sign of acute infarction, mass lesion, hemorrhage, hydrocephalus or extra-axial collection. Vascular: There is atherosclerotic calcification of the major vessels at the base of the brain. Skull: Negative Sinuses/Orbits: Clear/normal Other: None IMPRESSION: No acute or traumatic finding. Extensive chronic ischemic changes affecting the brain as outlined above. Electronically Signed   By: Nelson Chimes M.D.   On: 07/27/2018 14:28    Procedures Procedures (including critical care time)  Medications Ordered in ED Medications  sodium chloride 0.9 % bolus 1,000 mL (1,000 mLs Intravenous New Bag/Given 07/27/18 1347)    And  0.9 %  sodium chloride infusion (has no administration in time range)     Initial Impression / Assessment and Plan / ED Course  I have reviewed the triage vital signs and the nursing notes.  Pertinent  labs & imaging results that were available during my care of the patient were reviewed by me and considered in my medical decision making (see chart for details).     BP (!) 182/77   Pulse 60   Temp 98.9 F (37.2 C) (Oral)   Resp 12   Ht 5' 3.5" (1.613 m)   Wt 73 kg   SpO2 98%   BMI 28.07 kg/m    Final Clinical Impressions(s) / ED Diagnoses   Final diagnoses:  Fall, initial encounter  Minor head injury, initial encounter    ED Discharge Orders    None      1:39 PM Patient with history of recurrent falls here with a syncopal episode.  She does have ecchymosis to her left forehead but she has normal mentation without focal neuro deficit.  No precipitating symptoms prior to the fall.  Work-up initiated. Ice pack placed.   3:01 PM Normal orthostatic vital sign, urine shows no signs of urinary tract infection, EKG without concerning arrhythmia or ischemic changes, normal troponin, electrolytes panels are reassuring, normal WBC, normal H&H, normal CBG, head CT scan without acute finding.   Vital signs stable at this time.  Patient is hypertensive with a blood pressure of 182/77.  3:28 PM Pt felt she did not have a syncopal episode. The felt "It happened so fast but I did not passed out".  Given her recurrent falls and her labs are reassuring, I discussed this with Dr. Regenia Skeeter who have also evaluated pt and felt she can be discharge home due to fall, likely mechanical.  Strict return precaution given.     Domenic Moras, PA-C 07/27/18 1531    Sherwood Gambler, MD 07/28/18 [phone]

## 2018-07-27 NOTE — ED Notes (Signed)
Patient transported to CT 

## 2018-08-06 DIAGNOSIS — Z1231 Encounter for screening mammogram for malignant neoplasm of breast: Secondary | ICD-10-CM | POA: Diagnosis not present

## 2018-08-06 DIAGNOSIS — Z96652 Presence of left artificial knee joint: Secondary | ICD-10-CM | POA: Diagnosis not present

## 2018-08-06 DIAGNOSIS — Z96653 Presence of artificial knee joint, bilateral: Secondary | ICD-10-CM | POA: Diagnosis not present

## 2018-08-06 DIAGNOSIS — Z471 Aftercare following joint replacement surgery: Secondary | ICD-10-CM | POA: Diagnosis not present

## 2018-08-06 DIAGNOSIS — Z96651 Presence of right artificial knee joint: Secondary | ICD-10-CM | POA: Diagnosis not present

## 2019-01-14 ENCOUNTER — Telehealth: Payer: Self-pay | Admitting: Neurology

## 2019-01-14 NOTE — Telephone Encounter (Signed)
I called the patient.  I do not see anything in the medical records that would exclude her from jury duty.  She was seen for what appears to be vasovagal syncope, she has done well in this regard as far as I know.  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  There are no significant cognitive issues that would exclude her from jury duty.  At this time, I cannot write a letter excluding her from jury duty.

## 2019-01-14 NOTE — Telephone Encounter (Signed)
I called the patient.  I once again indicated that I do not think she has a neurologic issue that would exclude her from jury duty.  She indicates that she is having troubles with her memory, this was never discussed previously, I will be happy to try to get a visit set up for her in the future to evaluate her memory.

## 2019-01-14 NOTE — Telephone Encounter (Signed)
Patient calling after receiving message from Dr Jannifer Franklin about letter excusing her from jury duty. She would like to speak to him about this. Please call her at [phone]

## 2019-01-14 NOTE — Telephone Encounter (Signed)
Pt called stating that she has been summoned for [employer] and she is wanting the form she has or a letter written for her so that she can be removed permanently from being summoned due to her age. Please advise.

## 2019-01-15 NOTE — Telephone Encounter (Signed)
LVM for pt to call back about scheduling an appt

## 2019-04-11 ENCOUNTER — Other Ambulatory Visit: Payer: Self-pay

## 2019-04-11 DIAGNOSIS — Z20822 Contact with and (suspected) exposure to covid-19: Secondary | ICD-10-CM

## 2019-04-14 LAB — NOVEL CORONAVIRUS, NAA: SARS-CoV-2, NAA: NOT DETECTED

## 2020-03-24 ENCOUNTER — Emergency Department (HOSPITAL_COMMUNITY): Payer: Medicare (Managed Care)

## 2020-03-24 ENCOUNTER — Other Ambulatory Visit: Payer: Self-pay

## 2020-03-24 ENCOUNTER — Emergency Department (HOSPITAL_COMMUNITY)
Admission: EM | Admit: 2020-03-24 | Discharge: 2020-03-25 | Disposition: A | Discharge status: Home / Self Care | Payer: Medicare (Managed Care) | Attending: Emergency Medicine | Admitting: Emergency Medicine

## 2020-03-24 DIAGNOSIS — Z8673 Personal history of transient ischemic attack (TIA), and cerebral infarction without residual deficits: Secondary | ICD-10-CM | POA: Diagnosis not present

## 2020-03-24 DIAGNOSIS — Z96653 Presence of artificial knee joint, bilateral: Secondary | ICD-10-CM | POA: Insufficient documentation

## 2020-03-24 DIAGNOSIS — R4182 Altered mental status, unspecified: Secondary | ICD-10-CM | POA: Insufficient documentation

## 2020-03-24 DIAGNOSIS — I10 Essential (primary) hypertension: Secondary | ICD-10-CM | POA: Diagnosis not present

## 2020-03-24 DIAGNOSIS — Z79899 Other long term (current) drug therapy: Secondary | ICD-10-CM | POA: Insufficient documentation

## 2020-03-24 DIAGNOSIS — Z7982 Long term (current) use of aspirin: Secondary | ICD-10-CM | POA: Diagnosis not present

## 2020-03-24 DIAGNOSIS — F17210 Nicotine dependence, cigarettes, uncomplicated: Secondary | ICD-10-CM | POA: Insufficient documentation

## 2020-03-24 LAB — TROPONIN I (HIGH SENSITIVITY): Troponin I (High Sensitivity): 4 ng/L (ref <18)

## 2020-03-24 LAB — RAPID URINE DRUG SCREEN, HOSP PERFORMED
Amphetamines: NOT DETECTED
Barbiturates: NOT DETECTED
Benzodiazepines: NOT DETECTED
Cocaine: NOT DETECTED
Opiates: NOT DETECTED
Tetrahydrocannabinol: NOT DETECTED

## 2020-03-24 LAB — CK: Total CK: 67 U/L (ref 38–234)

## 2020-03-24 LAB — URINALYSIS, ROUTINE W REFLEX MICROSCOPIC
Bilirubin Urine: NEGATIVE
Glucose, UA: NEGATIVE mg/dL
Hgb urine dipstick: NEGATIVE
Ketones, ur: NEGATIVE mg/dL
Leukocytes,Ua: NEGATIVE
Nitrite: NEGATIVE
Protein, ur: NEGATIVE mg/dL
Specific Gravity, Urine: 1.016 (ref 1.005–1.030)
pH: 7 (ref 5.0–8.0)

## 2020-03-24 LAB — I-STAT VENOUS BLOOD GAS, ED
Acid-Base Excess: 3 mmol/L — ABNORMAL HIGH (ref 0.0–2.0)
Bicarbonate: 27.8 mmol/L (ref 20.0–28.0)
Calcium, Ion: 1.16 mmol/L (ref 1.15–1.40)
HCT: 40 % (ref 36.0–46.0)
Hemoglobin: 13.6 g/dL (ref 12.0–15.0)
O2 Saturation: 70 %
Potassium: 3.6 mmol/L (ref 3.5–5.1)
Sodium: 144 mmol/L (ref 135–145)
TCO2: 29 mmol/L (ref 22–32)
pCO2, Ven: 40.6 mmHg — ABNORMAL LOW (ref 44.0–60.0)
pH, Ven: 7.443 — ABNORMAL HIGH (ref 7.250–7.430)
pO2, Ven: 35 mmHg (ref 32.0–45.0)

## 2020-03-24 LAB — COMPREHENSIVE METABOLIC PANEL
ALT: 12 U/L (ref 0–44)
AST: 18 U/L (ref 15–41)
Albumin: 3.6 g/dL (ref 3.5–5.0)
Alkaline Phosphatase: 52 U/L (ref 38–126)
Anion gap: 11 (ref 5–15)
BUN: 13 mg/dL (ref 8–23)
CO2: 23 mmol/L (ref 22–32)
Calcium: 9.2 mg/dL (ref 8.9–10.3)
Chloride: 107 mmol/L (ref 98–111)
Creatinine, Ser: 0.75 mg/dL (ref 0.44–1.00)
GFR calc Af Amer: >60 mL/min (ref >60)
GFR calc non Af Amer: >60 mL/min (ref >60)
Glucose, Bld: 84 mg/dL (ref 70–99)
Potassium: 3.7 mmol/L (ref 3.5–5.1)
Sodium: 141 mmol/L (ref 135–145)
Total Bilirubin: 0.9 mg/dL (ref 0.3–1.2)
Total Protein: 6.3 g/dL — ABNORMAL LOW (ref 6.5–8.1)

## 2020-03-24 LAB — BRAIN NATRIURETIC PEPTIDE: B Natriuretic Peptide: 185.9 pg/mL — ABNORMAL HIGH (ref 0.0–100.0)

## 2020-03-24 LAB — CBC WITH DIFFERENTIAL/PLATELET
Abs Immature Granulocytes: 0.02 10*3/uL (ref 0.00–0.07)
Basophils Absolute: 0.1 10*3/uL (ref 0.0–0.1)
Basophils Relative: 1 %
Eosinophils Absolute: 0.2 10*3/uL (ref 0.0–0.5)
Eosinophils Relative: 3 %
HCT: 39.9 % (ref 36.0–46.0)
Hemoglobin: 13 g/dL (ref 12.0–15.0)
Immature Granulocytes: 0 %
Lymphocytes Relative: 37 %
Lymphs Abs: 2.3 10*3/uL (ref 0.7–4.0)
MCH: 29.6 pg (ref 26.0–34.0)
MCHC: 32.6 g/dL (ref 30.0–36.0)
MCV: 90.9 fL (ref 80.0–100.0)
Monocytes Absolute: 0.5 10*3/uL (ref 0.1–1.0)
Monocytes Relative: 8 %
Neutro Abs: 3.2 10*3/uL (ref 1.7–7.7)
Neutrophils Relative %: 51 %
Platelets: 163 10*3/uL (ref 150–400)
RBC: 4.39 MIL/uL (ref 3.87–5.11)
RDW: 14.6 % (ref 11.5–15.5)
WBC: 6.3 10*3/uL (ref 4.0–10.5)
nRBC: 0 % (ref 0.0–0.2)

## 2020-03-24 LAB — PHOSPHORUS: Phosphorus: 3.6 mg/dL (ref 2.5–4.6)

## 2020-03-24 LAB — CBG MONITORING, ED: Glucose-Capillary: 76 mg/dL (ref 70–99)

## 2020-03-24 LAB — PROTIME-INR
INR: 1 (ref 0.8–1.2)
Prothrombin Time: 12.9 seconds (ref 11.4–15.2)

## 2020-03-24 LAB — TSH: TSH: 0.572 u[IU]/mL (ref 0.350–4.500)

## 2020-03-24 LAB — MAGNESIUM: Magnesium: 1.8 mg/dL (ref 1.7–2.4)

## 2020-03-24 LAB — AMMONIA: Ammonia: 16 umol/L (ref 9–35)

## 2020-03-24 LAB — LIPASE, BLOOD: Lipase: 27 U/L (ref 11–51)

## 2020-03-24 LAB — LACTIC ACID, PLASMA: Lactic Acid, Venous: 1.2 mmol/L (ref 0.5–1.9)

## 2020-03-24 NOTE — ED Provider Notes (Signed)
[facility] EMERGENCY DEPARTMENT Provider Note   CSN: [id number] Arrival date & time: 03/24/20  1833     History Chief Complaint  Patient presents with  . Altered Mental Status  . Hypoglycemia    Christine Christensen is a 74 y.o. female.  HPI Patient and family have noted that there are periods where the patient seems confused.  She recognizes this.  She reports sometimes she has to spend a lot of time thinking about something and then forgets what she was planning to do.  Patient reports the other day she spent a lot of time thinking about things that she needed to do and by the next day could not remember any of them.  She reports recently she has started forgetting her grandchildren's names.  Patient's son expressed a concern that maybe she could have Alzheimer's.  He reports that his grandmother died of Alzheimer's.  We discussed symptoms such as forgetting how to do routine tasks.  They both acknowledge that this seems to be a problem.  Today, friends also noticed that she did not seem to understand or respond correctly to certain interactions.  There has not been any fever or vomiting.  Patient reports she has good appetite.  No diarrhea.  Patient denies headache or chest pain.  She lives with her son.    Past Medical History:  Diagnosis Date  . Anemia    history of anemia  . Aortic stenosis    mild aortic stenosis 07/27/17 echo  . Arthritis   . Heart murmur   . Hypertension   . Mitral stenosis    moderate mitral stenosis 07/27/17 echo  . Stroke [facility])    stroke x2, 2005 and 2008, some right sided weakness  . Syncope and collapse 10/25/2017  . Urinary incontinence     Patient Active Problem List   Diagnosis Date Noted  . Syncope and collapse 10/25/2017  . 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 CATARACT EXTRACTION, BILATERAL Bilateral   . CHOLECYSTECTOMY    . TONSILLECTOMY    . TOTAL KNEE ARTHROPLASTY  Right    right knee 2006  . TOTAL KNEE ARTHROPLASTY Left 08/06/2017   Procedure: LEFT TOTAL KNEE ARTHROPLASTY;  Surgeon: Vickey Huger, MD;  Location: [place];  Service: Orthopedics;  Laterality: Left;  . TUBAL LIGATION       OB History   No obstetric history on file.     Family History  Problem Relation Age of Onset  . Dementia Mother   . Heart disease Father   . Cerebral palsy Sister     Social History   Tobacco Use  . Smoking status: Current Every Day Smoker    Packs/day: 0.50    Years: 56.00    Pack years: 28.00    Types: Cigarettes  . Smokeless tobacco: Never Used  Vaping Use  . Vaping Use: Never used  Substance Use Topics  . Alcohol use: Yes    Comment: Sometimes  . Drug use: Yes    Types: Marijuana    Home Medications Prior to Admission medications   Medication Sig Start Date End Date Taking? Authorizing Provider  aspirin EC 325 MG EC tablet Take 1 tablet (325 mg total) 2 (two) times daily by mouth. Patient taking differently: Take 325 mg by mouth daily.  08/07/17  Yes Donia Ast, PA  escitalopram (LEXAPRO) 20 MG tablet Take 20 mg daily by mouth.  Yes [provider]  cromolyn (OPTICROM) 4 % ophthalmic solution Place 1 drop into both eyes See admin instructions. Instill one drop in both eyes two to four times daily. Patient not taking: Reported on 03/24/2020 03/19/18   [provider]  Vitamin D, Ergocalciferol, (DRISDOL) 1.25 MG (50000 UT) CAPS capsule Take 50,000 Units by mouth once a week. Sunday Patient not taking: Reported on 03/24/2020 06/21/18   [provider]    Allergies    Patient has no known allergies.  Review of Systems   Review of Systems 10 Systems reviewed and negative except as per HPI. Physical Exam Updated Vital Signs BP (!) 148/73   Pulse (!) 56   Temp 98 F (36.7 C) (Oral)   Resp 12   SpO2 100%   Physical Exam Constitutional:      Appearance: She is well-developed.  HENT:     Head:  Normocephalic and atraumatic.     Mouth/Throat:     Mouth: Mucous membranes are moist.     Pharynx: Oropharynx is clear.  Eyes:     Pupils: Pupils are equal, round, and reactive to light.  Cardiovascular:     Rate and Rhythm: Normal rate and regular rhythm.     Heart sounds: Normal heart sounds.  Pulmonary:     Effort: Pulmonary effort is normal.     Breath sounds: Normal breath sounds.  Abdominal:     General: Bowel sounds are normal. There is no distension.     Palpations: Abdomen is soft.     Tenderness: There is no abdominal tenderness.  Musculoskeletal:        General: Normal range of motion.     Cervical back: Neck supple.  Skin:    General: Skin is warm and dry.  Neurological:     General: No focal deficit present.     Mental Status: She is alert.     GCS: GCS eye subscore is 4. GCS verbal subscore is 5. GCS motor subscore is 6.     Cranial Nerves: No cranial nerve deficit.     Sensory: No sensory deficit.     Motor: No weakness.     Coordination: Coordination normal.  Psychiatric:        Mood and Affect: Mood normal.     ED Results / Procedures / Treatments   Labs (all labs ordered are listed, but only abnormal results are displayed) Labs Reviewed  COMPREHENSIVE METABOLIC PANEL - Abnormal; Notable for the following components:      Result Value   Total Protein 6.3 (*)    All other components within normal limits  BRAIN NATRIURETIC PEPTIDE - Abnormal; Notable for the following components:   B Natriuretic Peptide 185.9 (*)    All other components within normal limits  URINALYSIS, ROUTINE W REFLEX MICROSCOPIC - Abnormal; Notable for the following components:   APPearance CLOUDY (*)    All other components within normal limits  I-STAT VENOUS BLOOD GAS, ED - Abnormal; Notable for the following components:   pH, Ven 7.443 (*)    pCO2, Ven 40.6 (*)    Acid-Base Excess 3.0 (*)    All other components within normal limits  LIPASE, BLOOD  LACTIC ACID, PLASMA  CBC  WITH DIFFERENTIAL/PLATELET  PROTIME-INR  MAGNESIUM  PHOSPHORUS  TSH  CK  AMMONIA  LACTIC ACID, PLASMA  RAPID URINE DRUG SCREEN, HOSP PERFORMED  BLOOD GAS, VENOUS  CBG MONITORING, ED  TROPONIN I (HIGH SENSITIVITY)  TROPONIN I (HIGH SENSITIVITY)  EKG EKG Interpretation  Date/Time:  Wednesday March 24 2020 23:10:55 EDT Ventricular Rate:  56 PR Interval:    QRS Duration: 87 QT Interval:  459 QTC Calculation: 443 R Axis:   55 Text Interpretation: Sinus rhythm normal, no change from old Confirmed by Charlesetta Shanks [phone]) on 03/24/2020 11:37:46 PM   Radiology CT Head Wo Contrast  Result Date: 03/24/2020 CLINICAL DATA:  Altered mental status EXAM: CT HEAD WITHOUT CONTRAST TECHNIQUE: Contiguous axial images were obtained from the base of the skull through the vertex without intravenous contrast. COMPARISON:  07/27/2018 FINDINGS: Brain: Changes of prior lacunar infarcts are noted within the right cerebellum as well as the left half of the pons and left basal ganglia. Chronic white matter ischemic changes are noted. No findings to suggest acute hemorrhage, acute infarction or space-occupying mass lesion are noted mild atrophic changes are seen. Vascular: No hyperdense vessel or unexpected calcification. Skull: Normal. Negative for fracture or focal lesion. Sinuses/Orbits: No acute finding. Chronic mucosal retention cyst is noted in the left maxillary antrum. Other: None IMPRESSION: Chronic atrophic and ischemic changes similar to that seen on the prior exam. No acute abnormality noted. Electronically Signed   By: [name] Catalina M.D.   On: 03/24/2020 20:28   DG Chest Port 1 View  Result Date: 03/24/2020 CLINICAL DATA:  Altered mental status EXAM: PORTABLE CHEST 1 VIEW COMPARISON:  03/10/2008. FINDINGS: 1904 hours. The lungs are clear without focal pneumonia, edema, pneumothorax or pleural effusion. Interstitial markings are diffusely coarsened with chronic features. Cardiopericardial silhouette  is at upper limits of normal for size. The visualized bony structures of the thorax are intact. Telemetry leads overlie the chest. IMPRESSION: No active disease. Electronically Signed   By: Misty Stanley M.D.   On: 03/24/2020 19:45    Procedures Procedures (including critical care time)  Medications Ordered in ED Medications - No data to display  ED Course  I have reviewed the triage vital signs and the nursing notes.  Pertinent labs & imaging results that were available during my care of the patient were reviewed by me and considered in my medical decision making (see chart for details).    MDM Rules/Calculators/A&P                          Patient has been exhibiting symptoms of confusion.  This has been waxing and waning.  Family and friends as well as the patient are noting episodes of forgetting how to do typical activities, forgetting names of family members and forgetting planned tasks.  Patient is aware of this.  She is alert and interactive.  Clinically she is well in appearance.  Speach is clear.  Movements are coordinated and purposeful.  Diagnostic work-up is negative.  Patient son expresses concern for Alzheimer's dementia.  He and the patient reports that it runs in her family.  Some of the described symptoms are suggestive of dementia.  Patient is clinically well but has waxing and waning memory and learned task deficits.  With work-up negative and vital signs stable, patient is appropriate for discharge at this time.  He lives with family members.  Return precautions reviewed.  Encouraged to follow-up with PCP within the next several days to discuss the symptoms and further specialty evaluation with possible referral to neurology. Final Clinical Impression(s) / ED Diagnoses Final diagnoses:  Altered mental status, unspecified altered mental status type    Rx / DC Orders ED Discharge Orders  None       Charlesetta Shanks, MD 03/24/20 2340

## 2020-03-24 NOTE — Discharge Instructions (Addendum)
1.  Schedule follow-up appoint with your family doctor in 2 to 3 days. Discuss all of the symptoms you are observing at home. 2.  Return to the emergency department if you develop a fever, persistent or worsening confusion or other concerning symptoms.

## 2020-03-24 NOTE — Care Management (Signed)
ED CM consulted by Christine Christensen on [facility] concerning possible [place] services.  Patient lives at home with her adult son who has some visible disabilities.  Patient noted to have a saturated diaper with  a strong odor of urine.  CM met with patient and son Christine Christensen [phone]. Son reports patient has been recently experiencing  memory issues. He also states, she has not been eating or drinking as much.  CM discussed [place] services with patient and son as I believe patient may benefit from [facility] service, but patient and son declined the services at this time,citing that the home is not ready as of yet. CM explained that they can notify PCP when they are ready and he can arrange it as well. No further ED CM needs identified

## 2020-03-24 NOTE — ED Triage Notes (Signed)
BIB [facility] after family report pt having AMS. PT had decrease fluid and food intake for last few days. EMS reports pt had GCS of 10,  A&O x 1 (self only) and CBG of 72. PT improved to A&O x 4 after 50 mL D10  And 150 ml NS bolus given on rig. Upon arrival pt is A&O x 4. PT has hx of previous stroke.   B /P 140/69 100% 02 HR 57

## 2020-03-25 LAB — TROPONIN I (HIGH SENSITIVITY): Troponin I (High Sensitivity): 5 ng/L (ref <18)

## 2020-06-22 ENCOUNTER — Ambulatory Visit: Payer: Medicare (Managed Care) | Attending: Internal Medicine

## 2020-06-22 DIAGNOSIS — Z23 Encounter for immunization: Secondary | ICD-10-CM

## 2020-06-22 NOTE — Progress Notes (Signed)
   Covid-19 Vaccination Clinic  Name:  Christine Christensen    MRN: [id number] DOB: 1946-05-21  06/22/2020  [name] was observed post Covid-19 immunization for 15 minutes without incident. She was provided with Vaccine Information Sheet and instruction to access the V-Safe system.   [name] was instructed to call [phone] with any severe reactions post vaccine: Marland Kitchen Difficulty breathing  . Swelling of face and throat  . A fast heartbeat  . A bad rash all over body  . Dizziness and weakness

## 2021-03-03 ENCOUNTER — Other Ambulatory Visit: Payer: Self-pay | Admitting: Internal Medicine

## 2021-03-04 LAB — CBC
HCT: 38.5 % (ref 35.0–45.0)
Hemoglobin: 12.5 g/dL (ref 11.7–15.5)
MCH: 29.5 pg (ref 27.0–33.0)
MCHC: 32.5 g/dL (ref 32.0–36.0)
MCV: 90.8 fL (ref 80.0–100.0)
MPV: 13.8 fL — ABNORMAL HIGH (ref 7.5–12.5)
Platelets: 175 10*3/uL (ref 140–400)
RBC: 4.24 10*6/uL (ref 3.80–5.10)
RDW: 14.1 % (ref 11.0–15.0)
WBC: 4.2 10*3/uL (ref 3.8–10.8)

## 2021-03-04 LAB — BASIC METABOLIC PANEL WITH GFR
BUN: 20 mg/dL (ref 7–25)
CO2: 27 mmol/L (ref 20–32)
Calcium: 9.4 mg/dL (ref 8.6–10.4)
Chloride: 104 mmol/L (ref 98–110)
Creat: 0.75 mg/dL (ref 0.60–0.93)
GFR, Est African American: 90 mL/min/{1.73_m2} (ref >=60)
GFR, Est Non African American: 78 mL/min/{1.73_m2} (ref >=60)
Glucose, Bld: 76 mg/dL (ref 65–99)
Potassium: 3.7 mmol/L (ref 3.5–5.3)
Sodium: 141 mmol/L (ref 135–146)

## 2021-03-17 ENCOUNTER — Ambulatory Visit (HOSPITAL_COMMUNITY): Admission: RE | Admit: 2021-03-17 | Payer: Medicare (Managed Care) | Source: Ambulatory Visit

## 2021-03-17 ENCOUNTER — Other Ambulatory Visit (HOSPITAL_COMMUNITY): Payer: Self-pay | Admitting: Internal Medicine

## 2021-03-17 DIAGNOSIS — R55 Syncope and collapse: Secondary | ICD-10-CM

## 2021-06-12 ENCOUNTER — Emergency Department (HOSPITAL_COMMUNITY): Payer: Medicare (Managed Care)

## 2021-06-12 ENCOUNTER — Inpatient Hospital Stay (HOSPITAL_COMMUNITY)
Admission: EM | Admit: 2021-06-12 | Discharge: 2021-06-29 | DRG: 521 | Disposition: A | Discharge status: Skilled Nursing Facility | Payer: Medicare (Managed Care) | Attending: Family Medicine | Admitting: Family Medicine

## 2021-06-12 ENCOUNTER — Encounter (HOSPITAL_COMMUNITY): Payer: Self-pay | Admitting: Emergency Medicine

## 2021-06-12 DIAGNOSIS — I10 Essential (primary) hypertension: Secondary | ICD-10-CM | POA: Diagnosis present

## 2021-06-12 DIAGNOSIS — W19XXXA Unspecified fall, initial encounter: Secondary | ICD-10-CM | POA: Insufficient documentation

## 2021-06-12 DIAGNOSIS — K5900 Constipation, unspecified: Secondary | ICD-10-CM | POA: Diagnosis not present

## 2021-06-12 DIAGNOSIS — I350 Nonrheumatic aortic (valve) stenosis: Secondary | ICD-10-CM | POA: Diagnosis not present

## 2021-06-12 DIAGNOSIS — I272 Pulmonary hypertension, unspecified: Secondary | ICD-10-CM | POA: Diagnosis present

## 2021-06-12 DIAGNOSIS — Z96641 Presence of right artificial hip joint: Secondary | ICD-10-CM | POA: Diagnosis present

## 2021-06-12 DIAGNOSIS — F0154 Vascular dementia, unspecified severity, with anxiety: Secondary | ICD-10-CM | POA: Diagnosis present

## 2021-06-12 DIAGNOSIS — Z79899 Other long term (current) drug therapy: Secondary | ICD-10-CM

## 2021-06-12 DIAGNOSIS — Z9889 Other specified postprocedural states: Secondary | ICD-10-CM

## 2021-06-12 DIAGNOSIS — S42401A Unspecified fracture of lower end of right humerus, initial encounter for closed fracture: Secondary | ICD-10-CM

## 2021-06-12 DIAGNOSIS — F05 Delirium due to known physiological condition: Secondary | ICD-10-CM

## 2021-06-12 DIAGNOSIS — G9341 Metabolic encephalopathy: Secondary | ICD-10-CM | POA: Diagnosis not present

## 2021-06-12 DIAGNOSIS — Z20822 Contact with and (suspected) exposure to covid-19: Secondary | ICD-10-CM | POA: Diagnosis present

## 2021-06-12 DIAGNOSIS — E43 Unspecified severe protein-calorie malnutrition: Secondary | ICD-10-CM | POA: Diagnosis present

## 2021-06-12 DIAGNOSIS — Z818 Family history of other mental and behavioral disorders: Secondary | ICD-10-CM | POA: Diagnosis not present

## 2021-06-12 DIAGNOSIS — M1990 Unspecified osteoarthritis, unspecified site: Secondary | ICD-10-CM | POA: Diagnosis present

## 2021-06-12 DIAGNOSIS — S72001A Fracture of unspecified part of neck of right femur, initial encounter for closed fracture: Principal | ICD-10-CM | POA: Diagnosis present

## 2021-06-12 DIAGNOSIS — L89322 Pressure ulcer of left buttock, stage 2: Secondary | ICD-10-CM | POA: Diagnosis present

## 2021-06-12 DIAGNOSIS — Z01810 Encounter for preprocedural cardiovascular examination: Secondary | ICD-10-CM | POA: Diagnosis not present

## 2021-06-12 DIAGNOSIS — F03918 Unspecified dementia, unspecified severity, with other behavioral disturbance: Secondary | ICD-10-CM

## 2021-06-12 DIAGNOSIS — Z419 Encounter for procedure for purposes other than remedying health state, unspecified: Secondary | ICD-10-CM

## 2021-06-12 DIAGNOSIS — Y92009 Unspecified place in unspecified non-institutional (private) residence as the place of occurrence of the external cause: Secondary | ICD-10-CM | POA: Insufficient documentation

## 2021-06-12 DIAGNOSIS — I080 Rheumatic disorders of both mitral and aortic valves: Secondary | ICD-10-CM | POA: Diagnosis present

## 2021-06-12 DIAGNOSIS — Y92010 Kitchen of single-family (private) house as the place of occurrence of the external cause: Secondary | ICD-10-CM

## 2021-06-12 DIAGNOSIS — R52 Pain, unspecified: Secondary | ICD-10-CM

## 2021-06-12 DIAGNOSIS — R55 Syncope and collapse: Secondary | ICD-10-CM | POA: Diagnosis not present

## 2021-06-12 DIAGNOSIS — R011 Cardiac murmur, unspecified: Secondary | ICD-10-CM | POA: Diagnosis present

## 2021-06-12 DIAGNOSIS — F17210 Nicotine dependence, cigarettes, uncomplicated: Secondary | ICD-10-CM | POA: Diagnosis present

## 2021-06-12 DIAGNOSIS — Z781 Physical restraint status: Secondary | ICD-10-CM

## 2021-06-12 DIAGNOSIS — F01518 Vascular dementia, unspecified severity, with other behavioral disturbance: Secondary | ICD-10-CM | POA: Diagnosis present

## 2021-06-12 DIAGNOSIS — R296 Repeated falls: Secondary | ICD-10-CM | POA: Diagnosis present

## 2021-06-12 DIAGNOSIS — Z9181 History of falling: Secondary | ICD-10-CM

## 2021-06-12 DIAGNOSIS — S72009A Fracture of unspecified part of neck of unspecified femur, initial encounter for closed fracture: Secondary | ICD-10-CM | POA: Diagnosis present

## 2021-06-12 DIAGNOSIS — Z6820 Body mass index (BMI) 20.0-20.9, adult: Secondary | ICD-10-CM

## 2021-06-12 DIAGNOSIS — M25579 Pain in unspecified ankle and joints of unspecified foot: Secondary | ICD-10-CM

## 2021-06-12 DIAGNOSIS — Z7982 Long term (current) use of aspirin: Secondary | ICD-10-CM

## 2021-06-12 DIAGNOSIS — Z8249 Family history of ischemic heart disease and other diseases of the circulatory system: Secondary | ICD-10-CM | POA: Diagnosis not present

## 2021-06-12 DIAGNOSIS — I050 Rheumatic mitral stenosis: Secondary | ICD-10-CM | POA: Diagnosis not present

## 2021-06-12 DIAGNOSIS — Z96653 Presence of artificial knee joint, bilateral: Secondary | ICD-10-CM | POA: Diagnosis present

## 2021-06-12 DIAGNOSIS — I69351 Hemiplegia and hemiparesis following cerebral infarction affecting right dominant side: Secondary | ICD-10-CM | POA: Diagnosis not present

## 2021-06-12 DIAGNOSIS — S72001D Fracture of unspecified part of neck of right femur, subsequent encounter for closed fracture with routine healing: Secondary | ICD-10-CM | POA: Diagnosis not present

## 2021-06-12 DIAGNOSIS — I342 Nonrheumatic mitral (valve) stenosis: Secondary | ICD-10-CM | POA: Diagnosis not present

## 2021-06-12 DIAGNOSIS — L8990 Pressure ulcer of unspecified site, unspecified stage: Secondary | ICD-10-CM | POA: Diagnosis present

## 2021-06-12 LAB — CBC WITH DIFFERENTIAL/PLATELET
Abs Immature Granulocytes: 0.07 10*3/uL (ref 0.00–0.07)
Basophils Absolute: 0 10*3/uL (ref 0.0–0.1)
Basophils Relative: 0 %
Eosinophils Absolute: 0 10*3/uL (ref 0.0–0.5)
Eosinophils Relative: 0 %
HCT: 42.3 % (ref 36.0–46.0)
Hemoglobin: 14.2 g/dL (ref 12.0–15.0)
Immature Granulocytes: 1 %
Lymphocytes Relative: 9 %
Lymphs Abs: 0.7 10*3/uL (ref 0.7–4.0)
MCH: 29.8 pg (ref 26.0–34.0)
MCHC: 33.6 g/dL (ref 30.0–36.0)
MCV: 88.9 fL (ref 80.0–100.0)
Monocytes Absolute: 0.3 10*3/uL (ref 0.1–1.0)
Monocytes Relative: 3 %
Neutro Abs: 7.1 10*3/uL (ref 1.7–7.7)
Neutrophils Relative %: 87 %
Platelets: 219 10*3/uL (ref 150–400)
RBC: 4.76 MIL/uL (ref 3.87–5.11)
RDW: 14.9 % (ref 11.5–15.5)
WBC: 8.2 10*3/uL (ref 4.0–10.5)
nRBC: 0 % (ref 0.0–0.2)

## 2021-06-12 LAB — COMPREHENSIVE METABOLIC PANEL
ALT: 13 U/L (ref 0–44)
AST: 19 U/L (ref 15–41)
Albumin: 3.9 g/dL (ref 3.5–5.0)
Alkaline Phosphatase: 60 U/L (ref 38–126)
Anion gap: 9 (ref 5–15)
BUN: 13 mg/dL (ref 8–23)
CO2: 26 mmol/L (ref 22–32)
Calcium: 9.7 mg/dL (ref 8.9–10.3)
Chloride: 104 mmol/L (ref 98–111)
Creatinine, Ser: 0.87 mg/dL (ref 0.44–1.00)
GFR, Estimated: >60 mL/min (ref >60)
Glucose, Bld: 101 mg/dL — ABNORMAL HIGH (ref 70–99)
Potassium: 3.7 mmol/L (ref 3.5–5.1)
Sodium: 139 mmol/L (ref 135–145)
Total Bilirubin: 0.7 mg/dL (ref 0.3–1.2)
Total Protein: 7.6 g/dL (ref 6.5–8.1)

## 2021-06-12 LAB — RESP PANEL BY RT-PCR (FLU A&B, COVID) ARPGX2
Influenza A by PCR: NEGATIVE
Influenza B by PCR: NEGATIVE
SARS Coronavirus 2 by RT PCR: NEGATIVE

## 2021-06-12 MED ORDER — SODIUM CHLORIDE 0.9 % IV SOLN: INTRAVENOUS | Status: DC

## 2021-06-12 MED ORDER — SENNOSIDES-DOCUSATE SODIUM 8.6-50 MG PO TABS: 1.0000 | ORAL_TABLET | Freq: Every evening | ORAL | Status: DC | PRN

## 2021-06-12 MED ORDER — ASPIRIN 325 MG PO TABS
325.0000 mg | ORAL_TABLET | Freq: Every day | ORAL | Status: DC
  Administered 2021-06-13 – 2021-06-29 (×15): 325 mg via ORAL
  Filled 2021-06-12 (×15): qty 1

## 2021-06-12 MED ORDER — METOPROLOL TARTRATE 12.5 MG HALF TABLET
12.5000 mg | ORAL_TABLET | Freq: Two times a day (BID) | ORAL | Status: DC
  Administered 2021-06-12 – 2021-06-29 (×31): 12.5 mg via ORAL
  Filled 2021-06-12 (×33): qty 1

## 2021-06-12 MED ORDER — ENOXAPARIN SODIUM 40 MG/0.4ML IJ SOSY
40.0000 mg | PREFILLED_SYRINGE | INTRAMUSCULAR | Status: DC
  Administered 2021-06-13 – 2021-06-29 (×14): 40 mg via SUBCUTANEOUS
  Filled 2021-06-12 (×15): qty 0.4

## 2021-06-12 MED ORDER — HYDROCODONE-ACETAMINOPHEN 5-325 MG PO TABS
1.0000 | ORAL_TABLET | Freq: Four times a day (QID) | ORAL | Status: DC | PRN
  Administered 2021-06-13: 2 via ORAL
  Administered 2021-06-14: 1 via ORAL
  Administered 2021-06-15 (×2): 2 via ORAL
  Administered 2021-06-22 – 2021-06-26 (×5): 1 via ORAL
  Administered 2021-06-26: 2 via ORAL
  Filled 2021-06-12: qty 1
  Filled 2021-06-12: qty 2
  Filled 2021-06-12 (×3): qty 1
  Filled 2021-06-12: qty 2
  Filled 2021-06-12: qty 1
  Filled 2021-06-12: qty 2
  Filled 2021-06-12: qty 1
  Filled 2021-06-12: qty 2

## 2021-06-12 MED ORDER — HYDROMORPHONE HCL 1 MG/ML IJ SOLN
0.5000 mg | Freq: Once | INTRAMUSCULAR | Status: AC
  Administered 2021-06-12: 0.5 mg via INTRAVENOUS
  Filled 2021-06-12: qty 1

## 2021-06-12 MED ORDER — HYDROMORPHONE HCL 1 MG/ML IJ SOLN
0.5000 mg | INTRAMUSCULAR | Status: DC | PRN
  Administered 2021-06-12 – 2021-06-16 (×9): 0.5 mg via INTRAVENOUS
  Filled 2021-06-12 (×5): qty 0.5
  Filled 2021-06-12: qty 1
  Filled 2021-06-12 (×3): qty 0.5

## 2021-06-12 MED ORDER — ONDANSETRON HCL 4 MG/2ML IJ SOLN
4.0000 mg | Freq: Once | INTRAMUSCULAR | Status: AC
  Administered 2021-06-12: 4 mg via INTRAVENOUS
  Filled 2021-06-12: qty 2

## 2021-06-12 MED ORDER — ESCITALOPRAM OXALATE 10 MG PO TABS
20.0000 mg | ORAL_TABLET | Freq: Every day | ORAL | Status: DC
  Administered 2021-06-12 – 2021-06-29 (×16): 20 mg via ORAL
  Filled 2021-06-12: qty 1
  Filled 2021-06-12 (×13): qty 2
  Filled 2021-06-12: qty 1

## 2021-06-12 MED ORDER — LORAZEPAM 2 MG/ML IJ SOLN
1.0000 mg | Freq: Once | INTRAMUSCULAR | Status: AC
  Administered 2021-06-12: 1 mg via INTRAVENOUS
  Filled 2021-06-12: qty 1

## 2021-06-12 MED ORDER — BISACODYL 5 MG PO TBEC: 5.0000 mg | DELAYED_RELEASE_TABLET | Freq: Every day | ORAL | Status: DC | PRN

## 2021-06-12 NOTE — ED Notes (Signed)
Pt transported to radiology.

## 2021-06-12 NOTE — Consult Note (Signed)
ORTHOPAEDIC CONSULTATION  REQUESTING PHYSICIAN: Lequita Halt, MD  Chief Complaint: Right displaced femoral neck fracture  HPI: Christine Christensen is a 75 y.o. female who is a significant cardiac history presents to the ED following a fall at home.  X-rays demonstrated displaced femoral neck fracture.  The patient is currently not following commands or answering questions but her son who lives with her and is the power of attorney is at bedside.  The son reports a gradual decline and recurrent falls over the past few months.  She is mostly homebound with a cane or walker and really only gets out of the house to go to church.  The son also reports that she has been diagnosed with vascular dementia and has been gradually declining cognitively.   Past Medical History:  Diagnosis Date   Anemia    history of anemia   Aortic stenosis    mild aortic stenosis 07/27/17 echo   Arthritis    Heart murmur    Hypertension    Mitral stenosis    moderate mitral stenosis 07/27/17 echo   Stroke [facility])    stroke x2, 2005 and 2008, some right sided weakness   Syncope and collapse 10/25/2017   Urinary incontinence    Past Surgical History:  Procedure Laterality Date   CATARACT EXTRACTION, BILATERAL Bilateral    CHOLECYSTECTOMY     TONSILLECTOMY     TOTAL KNEE ARTHROPLASTY Right    right knee 2006   TOTAL KNEE ARTHROPLASTY Left 08/06/2017   Procedure: LEFT TOTAL KNEE ARTHROPLASTY;  Surgeon: Vickey Huger, MD;  Location: [place];  Service: Orthopedics;  Laterality: Left;   TUBAL LIGATION     Social History   Socioeconomic History   Marital status: Divorced    Spouse name: Not on file   Number of children: 2   Years of education: 14   Highest education level: Not on file  Occupational History   Not on file  Tobacco Use   Smoking status: Every Day    Packs/day: 0.50    Years: 56.00    Pack years: 28.00    Types: Cigarettes   Smokeless tobacco: Never  Vaping Use   Vaping Use: Never used  Substance  and Sexual Activity   Alcohol use: Yes    Comment: Sometimes   Drug use: Yes    Types: Marijuana   Sexual activity: Not on file  Other Topics Concern   Not on file  Social History Narrative   Lives with son   Caffeine use: Drinks coffee 3 times weekly, Iced tea sometimes   Right handed    Social Determinants of Health   Financial Resource Strain: Not on file  Food Insecurity: Not on file  Transportation Needs: Not on file  Physical Activity: Not on file  Stress: Not on file  Social Connections: Not on file   Family History  Problem Relation Age of Onset   Dementia Mother    Heart disease Father    Cerebral palsy Sister    No Known Allergies   Positive ROS: All other systems have been reviewed and were otherwise negative with the exception of those mentioned in the HPI and as above.  Physical Exam: General: Alert, no acute distress Cardiovascular: No pedal edema Respiratory: No cyanosis, no use of accessory musculature Skin: No lesions in the area of chief complaint Neurologic: Sensation intact distally Psychiatric: Patient is alert but not answering questions or following commands Lymphatic: No axillary or cervical lymphadenopathy  MUSCULOSKELETAL:  Skin is intact.  No swelling or tenderness about the knee or ankle.  Toes are warm well perfused.  She does wiggle her toes on the right but unable to demonstrate active dorsiflexion as she is not following commands.  No notable pain with passive range of motion of the left hip and knee.   IMAGING: xrays of the pelvis and right hip demonstrate a displaced right femoral neck fracture  Assessment: Active Problems:   Closed right hip fracture, initial encounter ([place])   Hip fracture (HCC)  Right displaced femoral neck fracture  Plan: I had a good discussion with patient's son and discussed her imaging findings.  Given the displacement of her fracture would not heal with the reduction and internal fixation but instead is  indicated for arthroplasty.  Discussed hemiarthroplasty versus total hip replacement.  Given that she is a very low demand and cognitively declining and her significant cardiac history feel she has a better candidate for a hemiarthroplasty over a total hip replacement which would be a shorter less invasive surgery and provide increased stability.  Total hip replacement would allow for a higher level of function and longer durability.  After discussing these risks and benefits the patient's son is in agreement with plan for partial hip replacement.  I also discussed the patient with the hospitalist and given her significant cardiac history he is consulting cardiology for cardiac clearance preoperatively.  We will plan to get the patient to the OR for surgery in the next day or 2 once she is cleared by cardiology.   Willaim Sheng, MD Cell [phone]

## 2021-06-12 NOTE — Consult Note (Addendum)
Cardiology Consult    Patient ID: [name] MRN: [id number], DOB/AGE: [birth year]   Admit date: 06/12/2021 Date of Consult: 06/13/2021 Requesting Provider: Dr. Roosevelt Locks  PCP:  Nolene Ebbs, MD   [facility] Providers Cardiologist:  None   {   Patient Profile    Christine Christensen is a 75 y.o. 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 Cardiology is consulted today for near syncope and mitral stenosis and pre-op evaluation at the request of Dr Roosevelt Locks.   History of Present Illness    Christine Christensen follows no cardiologist outpatient. She has an outpatient Echo done on 07/27/2017 showed EF 60-65%, no RMWA, grade I DD, elevated LV filling pressure, mild AS with mean gradient (S): 12 mm Hg. Peak gradient (S): 23 mm Hg. Valve area (VTI): 1.3 cm^2. Moderate MS with trivial MR, Mean gradient (D): 10 mm Hg. Valve area by pressure half-time: 1.63 cm^2. LA severely dilated. No ischemic workup noted in the past.   Per chart review, she has had syncope in 2019, was seen by neurology, MRI/CTA/EEG of head were unremarkable.  She had suffered CVAs in 2003 and 2008 with a baseline residual right hemiparesis. She also has dementia and suffers memory loss.   She presented to he ER 06/12/21 after a mechanical fall at home. Reportedly, she was switching herself from cane to roller walker, felt lightheaded suddenly, subsequently fell on her right side. There was no LOC. Her son reports that last month she had a similar episode at the supermarket where she felt lightheaded and fell on her right side. Family felt she has been having SOB with exertion and poor exercise tolerance.   Patient is a poor historian, she is limited due to dementia. She states she does not get SOB at home. She states she can walk 2 blocks and climb  up stairs if needed. She states she can perform ADLs independently without help. She does not recall any chest pain, weight gain, leg edema, orthopnea. She does not know how long or how often she feels lightheaded. She states her son [name] is helping her with medical needs, called her son for collateral history but no one is answering the phone at this time.   Admission diagnostics showed grossly unremarkable CBC diff and CMP. Flu and COVID negative. CTH and CXR no acute finding. Hip x-ray showed Moderately displaced proximal right femoral neck fracture. Patient was see by Ortho Dr Zachery Dakins, patient is planned for hemiarthroplasty today at 5 pm.   Cardiology is consulted today for further evaluation of her near syncope etiology, pre-op evaluation, and underlying valvular disease. EKG at admission showed sinus rhythm with ventricular rate of 76 bpm, PACs, LAE, no acute ischemic changes.  Echo is pending.    Past Medical History   Past Medical History:  Diagnosis Date   Anemia    history of anemia   Aortic stenosis    mild aortic stenosis 07/27/17 echo   Arthritis    Heart murmur    Hypertension    Mitral stenosis    moderate mitral stenosis 07/27/17 echo   Stroke [facility])    stroke x2, 2005 and 2008, some right sided weakness   Syncope and collapse 10/25/2017   Urinary incontinence     Past Surgical History:  Procedure Laterality Date   CATARACT EXTRACTION, BILATERAL Bilateral  CHOLECYSTECTOMY     TONSILLECTOMY     TOTAL KNEE ARTHROPLASTY Right    right knee 2006   TOTAL KNEE ARTHROPLASTY Left 08/06/2017   Procedure: LEFT TOTAL KNEE ARTHROPLASTY;  Surgeon: Vickey Huger, MD;  Location: [place];  Service: Orthopedics;  Laterality: Left;   TUBAL LIGATION       No Known Allergies Inpatient Medications     aspirin  325 mg Oral Daily   enoxaparin (LOVENOX) injection  40 mg Subcutaneous Q24H   escitalopram  20 mg Oral Daily   metoprolol tartrate  12.5 mg Oral BID    Family History     Family History  Problem Relation Age of Onset   Dementia Mother    Heart disease Father    Cerebral palsy Sister    She indicated that her mother is deceased. She indicated that her father is deceased. She indicated that all of her four sisters are alive. She indicated that only one of her two brothers is alive.   Social History    Social History   Socioeconomic History   Marital status: Divorced    Spouse name: Not on file   Number of children: 2   Years of education: 14   Highest education level: Not on file  Occupational History   Not on file  Tobacco Use   Smoking status: Every Day    Packs/day: 0.50    Years: 56.00    Pack years: 28.00    Types: Cigarettes   Smokeless tobacco: Never  Vaping Use   Vaping Use: Never used  Substance and Sexual Activity   Alcohol use: Yes    Comment: Sometimes   Drug use: Yes    Types: Marijuana   Sexual activity: Not on file  Other Topics Concern   Not on file  Social History Narrative   Lives with son   Caffeine use: Drinks coffee 3 times weekly, Iced tea sometimes   Right handed    Social Determinants of Health   Financial Resource Strain: Not on file  Food Insecurity: Not on file  Transportation Needs: Not on file  Physical Activity: Not on file  Stress: Not on file  Social Connections: Not on file  Intimate Partner Violence: Not on file     Review of Systems    ROS is limited due to dementia   Constitutional: Denied fever, chills Eyes: Denied vision change  Ears/Nose/Mouth/Throat: Denied coughing  Cardiovascular: see HPI Respiratory: see HPI Gastrointestinal: Denied nausea, vomiting, abdominal pain Genital/Urinary: Denied dysuria Musculoskeletal: see HPI  Skin: Denied rash, wound Neuro: see HPI    Physical Exam    Blood pressure (!) 142/76, pulse 75, temperature 99.5 F (37.5 C), temperature source Oral, resp. rate 18, height 5' 3.5" (1.613 m), weight 53.1 kg, SpO2 97 %.     Intake/Output Summary  (Last 24 hours) at 06/13/2021 1007 Last data filed at 06/13/2021 0600 Gross per 24 hour  Intake 784.18 ml  Output --  Net 784.18 ml   Wt Readings from Last 3 Encounters:  06/12/21 53.1 kg  07/27/18 73 kg  05/13/18 73.1 kg    Vitals:  Vitals:   06/13/21 0131 06/13/21 0515  BP: (!) 149/77 (!) 142/76  Pulse: 85 75  Resp:  18  Temp:  99.5 F (37.5 C)  SpO2:  97%   General Appearance: In no apparent distress, laying in bed, frail elderly HEENT: Normocephalic, atraumatic. Oral cavity dry.  Neck: Supple, trachea midline, no JVDs Cardiovascular: Regular rate  and rhythm, normal S1-S2, low pitched murmur noted apex  Respiratory: Resting breathing unlabored, lungs sounds clear to auscultation bilaterally, no use of accessory muscles. On room air.  No wheezes, rales or rhonchi.   Gastrointestinal: Bowel sounds positive, abdomen soft, non-tender, non-distended.  Extremities: no BLE edema  Genitourinary: Genital exam not performed Musculoskeletal: Generalized muscular atrophy, difficulty moving RLE due to right hips subjective pain, right leg is internally rotated Skin: Intact, warm, dry. No rashes or petechiae noted in exposed areas.  Neurologic: Lethargic, frequently fall asleep during conversation, oriented to self and place. Short and clear speech, + memory and cognitive deficit Psychiatric: Normal affect. Mood is appropriate.      Labs     Lab Results  Component Value Date   WBC 8.2 06/12/2021   HGB 14.2 06/12/2021   HCT 42.3 06/12/2021   MCV 88.9 06/12/2021   PLT 219 06/12/2021    Recent Labs  Lab 06/12/21 1530  NA 139  K 3.7  CL 104  CO2 26  BUN 13  CREATININE 0.87  CALCIUM 9.7  PROT 7.6  BILITOT 0.7  ALKPHOS 60  ALT 13  AST 19  GLUCOSE 101*      Radiology Studies    DG Chest 1 View  Result Date: 06/12/2021 CLINICAL DATA:  Fall with right-sided hip pain EXAM: CHEST  1 VIEW COMPARISON:  03/24/2020 FINDINGS: No focal opacity or pleural effusion. Stable  cardiomediastinal silhouette with aortic atherosclerosis. No pneumothorax. IMPRESSION: No active disease. Electronically Signed   By: Donavan Foil M.D.   On: 06/12/2021 17:00   CT Head Wo Contrast  Result Date: 06/12/2021 CLINICAL DATA:  Head injury after fall. EXAM: CT HEAD WITHOUT CONTRAST TECHNIQUE: Contiguous axial images were obtained from the base of the skull through the vertex without intravenous contrast. COMPARISON:  March 24, 2020. FINDINGS: Brain: Mild chronic ischemic white matter disease is noted. No mass effect or midline shift is noted. Ventricular size is within normal limits. There is no evidence of mass lesion, hemorrhage or acute infarction. Vascular: No hyperdense vessel or unexpected calcification. Skull: Normal. Negative for fracture or focal lesion. Sinuses/Orbits: No acute finding. Other: None. IMPRESSION: No acute intracranial abnormality seen. Electronically Signed   By: Marijo Conception M.D.   On: 06/12/2021 16:02   DG Hip Unilat W or Wo Pelvis 2-3 Views Right  Result Date: 06/12/2021 CLINICAL DATA:  Right hip pain after fall. EXAM: DG HIP (WITH OR WITHOUT PELVIS) 2-3V RIGHT COMPARISON:  None. FINDINGS: Moderately displaced proximal right femoral neck fracture is noted. Left hip is unremarkable. IMPRESSION: Moderately displaced proximal right femoral neck fracture. Electronically Signed   By: Marijo Conception M.D.   On: 06/12/2021 17:00    ECG & Cardiac Imaging    TTE 07/27/2017: - Left ventricle: The cavity size was normal. Wall thickness was increased in a pattern of mild LVH. Systolic function was normal. The estimated ejection fraction was in the range of 60% to 65%. Wall motion was normal; there were no regional wall motion abnormalities. Doppler parameters are consistent with abnormal left ventricular relaxation (grade 1 diastolic dysfunction). The E/e&' ratio is >20, suggesting markedly elevated LV filling pressure.  - Aortic valve: Trileaflet; mildly calcified leaflets.  Mild stenosis. There was no regurgitation. Mean gradient (S): 12 mmHg. Peak gradient (S): 23 mm Hg. Valve area (VTI): 1.3 cm^2. Valve area (Vmax): 1.42 cm^2. Valve area (Vmean): 1.53 cm^2.  - Mitral valve: Moderately calcified annulus. Moderate-severe stenosis. Trivial regurgitation. Mean gradient (  D): 10 mm Hg. Valve area by pressure half-time is 1.63 cm^2 (over-estimated on review), and 0.86 cm^2 by continuity equation. - There is at least some degree of pulmonary hypertension, though unable to quantify due to incomplete TR Doppler envelope. - Left atrium is severely dilated. The atrial septum bows from left to right, suggesting elevated LA>RA pressure.  - Inferior vena cava was normal in size. The respirophasic diameter changes were in the normal range (= 50%), consistent with normal central venous pressure.   EKG from 06/12/21 1538: sinus rhythm with ventricular rate of 76bpm, PACs, LAE, no acute ischemic changes   Telemetry showed SR with rate of 70-90s this AM, frequent PACs, runs of AT versus SVT lasting <1 minute with rate up to 140s noted x2 over the past 24 hours     Assessment & Plan     Moderate to severe mitral stenosis  Mild aortic stenosis  SVTs versus ATs - presented with near syncope, fall, and right femoral neck fracture; DOE and low exercise tolerance reported by son at home - EKG and telemetry without A fib, noted runs of AT or SVT lasting less than 1 minute with ventricular rate up to 140s  - Echo is pending, 2018 Echo as above  - clinically euvolemic, PRN loop diuretic can be considered if CHF symptoms occurs  - started on metoprolol 12.5mg  BID, goal of HR 60-65 to optimize diastolic filling, BP tolerating  - further recommendation to follow pending Echo   Pre-op cardiac evaluation  - RCRI score 1, 0.9% Perioperative Risk of Major Cardiac Event  - DASI score 15.45, functional capacity 4.64 METS (limited historian due to dementia) - Echo is pending, will review with  MD, see addendum for final recommendation on if further cardiac workup need before OR   Mechanical fall  Right femoral neck fracture - OR planned today per ortho   Dementia Hx of CVA with right sided weakness  - managed per IM   Margie Billet, ACNP-C 06/13/21  [id number]   Patient seen and examined with Margie Billet ACNP-C.  Agree as above, with the following exceptions and changes as noted below. Ms. Held is a 75 yo female with prior CVAs, vascular dementia, HTN, and calcific mitral stenosis as well as mild aortic valve stenosis who presents with mechanical fall and right femoral neck fracture. Gen: NAD, CV: RRR, 3/6 diastolic murmur, heard best at LLSB Lungs: clear, Abd: soft, Extrem: Warm, well perfused, no edema, Neuro/Psych: alert but not able to participate meaningfully in conversation. All available labs, radiology testing, previous records reviewed.   Patient's son is at the bedside and provides the history on behalf of the patient.  He is the POA.  We obtained an echocardiogram this morning given that her last echocardiogram was in 2018 and showed mild aortic valve stenosis and moderate mitral stenosis.  Repeat echocardiogram today showed severe mitral valve stenosis, hyperdynamic ventricle, biatrial enlargement, and severely elevated right ventricular systolic pressure.  There is severe mitral annular calcification with protrusions into the LVOT from the mitral annulus.  Aortic valve stenosis is felt to only be mild with a mean gradient of 10 mmHg.  There is also likely intracavitary obstruction due to hyperdynamic ventricle which is probably secondary to mitral valve stenosis.  This is not the optimal conditions under which to perform general anesthesia, however there few options for optimization in this scenario.  Echocardiogram report recommended cardiovascular surgery consultation, however in the setting of such severe mitral valve calcification and  frail patient substrate, cardiovascular surgery  is not likely an option for her mitral valve and I discussed this frankly with the patient's son.  It would certainly not need to be considered prior to intervention on her broken hip.  We can certainly review this as an outpatient if indicated after this hospitalization.  She has been started on low-dose beta-blockade which typically preoperatively would not be pursued, however may optimize her mitral valve stenosis somewhat prior to surgery.  Her hyperdynamic ventricle may respond to gentle fluid hydration, though would avoid excessive fluids pre and perioperatively.  Strongly recommend the involvement of cardiac anesthesia if available for close monitoring of hemodynamics during surgery.  Plan is for hemiarthroplasty to reduced surgical time under general anesthesia, which seems very reasonable.  The patient is high-very high risk for intermediate risk procedure.  No further cardiovascular testing is required prior to the procedure.  If this level of risk is acceptable to the patient and surgical team, the patient should be considered optimized from a cardiovascular standpoint.  This risk level was felt to be nonmodifiable at this time.  I discussed this with the patient's son who is her power of attorney.  He would like to discuss this further with the surgical team, but understands that not proceeding with hip surgery may limit her lifestyle and goals of care.  Overall he is willing to proceed.  I discussed these findings with the surgical team as well as the primary service.   Elouise Munroe, MD 06/13/21 12:48 PM

## 2021-06-12 NOTE — ED Notes (Signed)
Called [facility] for transport for patient to [place] [facility].

## 2021-06-12 NOTE — ED Triage Notes (Signed)
Pt arrives via EMS from home with reports of a fall in the kitchen to right hip. Endorses muscle spasms. EMS reports shortening and rotation. 50 mcg fentanyl intranasally given by EMS.

## 2021-06-12 NOTE — ED Provider Notes (Signed)
[facility] EMERGENCY DEPARTMENT Provider Note   CSN: [id number] Arrival date & time: 06/12/21  1410     History Chief Complaint  Patient presents with   Fall    Christine Christensen is a 75 y.o. female.  Patient had a fall in the kitchen she was twisting trying to pull a chair out from under the kitchen table and then she went down.  Complaint of right hip pain.  Thinks she did hit the back of her head.  EMS gave her 50 mcg of fentanyl intranasally.  Patient is followed by Dr. Lorre Nick from orthopedics he did her most recent knee replacement.  She has had bilateral knee replacements.  Patient without any other complaints.  Patient's past medical history is significant for hypertension mitral stenosis strokes in the past.  Patient denies being on any blood thinners.      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     Family History  Problem Relation Age of Onset   Dementia Mother    Heart disease Father    Cerebral palsy Sister     Social History   Tobacco Use   Smoking status: Every Day     Packs/day: 0.50    Years: 56.00    Pack years: 28.00    Types: Cigarettes   Smokeless tobacco: Never  Vaping Use   Vaping Use: Never used  Substance Use Topics   Alcohol use: Yes    Comment: Sometimes   Drug use: Yes    Types: Marijuana    Home Medications Prior to Admission medications   Medication Sig Start Date End Date Taking? Authorizing Provider  aspirin EC 325 MG EC tablet Take 1 tablet (325 mg total) 2 (two) times daily by mouth. Patient taking differently: Take 325 mg by mouth daily.  08/07/17   Donia Ast, PA  cromolyn (OPTICROM) 4 % ophthalmic solution Place 1 drop into both eyes See admin instructions. Instill one drop in both eyes two to four times daily. Patient not taking: Reported on 03/24/2020 03/19/18   [provider]  escitalopram (LEXAPRO) 20 MG tablet Take 20 mg daily by mouth.     [provider]  Vitamin D, Ergocalciferol, (DRISDOL) 1.25 MG (50000 UT) CAPS capsule Take 50,000 Units by mouth once a week. Sunday Patient not taking: Reported on 03/24/2020 06/21/18   [provider]    Allergies  Patient has no known allergies.  Review of Systems   Review of Systems  Constitutional:  Negative for chills and fever.  HENT:  Negative for congestion and sore throat.   Eyes:  Negative for pain and visual disturbance.  Respiratory:  Negative for cough and shortness of breath.   Cardiovascular:  Negative for chest pain and palpitations.  Gastrointestinal:  Negative for abdominal pain and vomiting.  Genitourinary:  Negative for dysuria and hematuria.  Musculoskeletal:  Negative for arthralgias and back pain.  Skin:  Negative for color change and rash.  Neurological:  Negative for seizures and syncope.  All other systems reviewed and are negative.  Physical Exam Updated Vital Signs BP 140/75   Pulse 90   Temp 98.4 F (36.9 C) (Oral)   Resp (!) 26   Ht 1.613 m (5' 3.5")   Wt 53.1 kg   SpO2 98%   BMI 20.40 kg/m    Physical Exam Vitals and nursing note reviewed.  Constitutional:      General: She is not in acute distress.    Appearance: Normal appearance. She is well-developed.  HENT:     Head: Normocephalic and atraumatic.  Eyes:     Extraocular Movements: Extraocular movements intact.     Conjunctiva/sclera: Conjunctivae normal.     Pupils: Pupils are equal, round, and reactive to light.  Cardiovascular:     Rate and Rhythm: Normal rate and regular rhythm.     Heart sounds: No murmur heard. Pulmonary:     Effort: Pulmonary effort is normal. No respiratory distress.     Breath sounds: Normal breath sounds.  Abdominal:     Palpations: Abdomen is soft.     Tenderness: There is no abdominal tenderness.  Musculoskeletal:        General: Deformity present.     Cervical back: Neck supple.     Right lower leg: No edema.     Left lower leg: No edema.     Comments: Patient with tenderness to palpation to the right hip.  The right lower extremity is shortened and internally rotated.  Patient dorsalis pedis pulse of both feet is about 1+.  There is good cap refill.  She can move her toes.  Well-healed scars to the anterior part of both knees secondary to her knee replacements.  Skin:    General: Skin is warm and dry.  Neurological:     General: No focal deficit present.     Mental Status: She is alert. Mental status is at baseline.    ED Results / Procedures / Treatments   Labs (all labs ordered are listed, but only abnormal results are displayed) Labs Reviewed  COMPREHENSIVE METABOLIC PANEL - Abnormal; Notable for the following components:      Result Value   Glucose, Bld 101 (*)    All other components within normal limits  RESP PANEL BY RT-PCR (FLU A&B, COVID) ARPGX2  CBC WITH DIFFERENTIAL/PLATELET    EKG EKG Interpretation  Date/Time:  Sunday June 12 2021 15:38:42 EDT Ventricular Rate:  76 PR Interval:  168 QRS Duration: 79 QT Interval:  392 QTC Calculation: 441 R  Axis:   41 Text Interpretation: Sinus rhythm Atrial premature complexes Probable left atrial enlargement Anterior infarct, old No significant change since last tracing Confirmed by Fredia Sorrow [phone]) on 06/12/2021 3:46:56 PM  Radiology DG Chest 1 View  Result Date: 06/12/2021 CLINICAL DATA:  Fall with right-sided hip pain EXAM: CHEST  1 VIEW COMPARISON:  03/24/2020 FINDINGS: No  focal opacity or pleural effusion. Stable cardiomediastinal silhouette with aortic atherosclerosis. No pneumothorax. IMPRESSION: No active disease. Electronically Signed   By: Donavan Foil M.D.   On: 06/12/2021 17:00   CT Head Wo Contrast  Result Date: 06/12/2021 CLINICAL DATA:  Head injury after fall. EXAM: CT HEAD WITHOUT CONTRAST TECHNIQUE: Contiguous axial images were obtained from the base of the skull through the vertex without intravenous contrast. COMPARISON:  March 24, 2020. FINDINGS: Brain: Mild chronic ischemic white matter disease is noted. No mass effect or midline shift is noted. Ventricular size is within normal limits. There is no evidence of mass lesion, hemorrhage or acute infarction. Vascular: No hyperdense vessel or unexpected calcification. Skull: Normal. Negative for fracture or focal lesion. Sinuses/Orbits: No acute finding. Other: None. IMPRESSION: No acute intracranial abnormality seen. Electronically Signed   By: Marijo Conception M.D.   On: 06/12/2021 16:02   DG Hip Unilat W or Wo Pelvis 2-3 Views Right  Result Date: 06/12/2021 CLINICAL DATA:  Right hip pain after fall. EXAM: DG HIP (WITH OR WITHOUT PELVIS) 2-3V RIGHT COMPARISON:  None. FINDINGS: Moderately displaced proximal right femoral neck fracture is noted. Left hip is unremarkable. IMPRESSION: Moderately displaced proximal right femoral neck fracture. Electronically Signed   By: Marijo Conception M.D.   On: 06/12/2021 17:00    Procedures Procedures   Medications Ordered in ED Medications  0.9 %  sodium chloride infusion (has no  administration in time range)  HYDROmorphone (DILAUDID) injection 0.5 mg (0.5 mg Intravenous Given 06/12/21 1536)  ondansetron (ZOFRAN) injection 4 mg (4 mg Intravenous Given 06/12/21 1535)    ED Course  I have reviewed the triage vital signs and the nursing notes.  Pertinent labs & imaging results that were available during my care of the patient were reviewed by me and considered in my medical decision making (see chart for details).    MDM Rules/Calculators/A&P                          CRITICAL CARE Performed by: Fredia Sorrow Total critical care time: 35 minutes Critical care time was exclusive of separately billable procedures and treating other patients. Critical care was necessary to treat or prevent imminent or life-threatening deterioration. Critical care was time spent personally by me on the following activities: development of treatment plan with patient and/or surrogate as well as nursing, discussions with consultants, evaluation of patient's response to treatment, examination of patient, obtaining history from patient or surrogate, ordering and performing treatments and interventions, ordering and review of laboratory studies, ordering and review of radiographic studies, pulse oximetry and re-evaluation of patient's condition.  X-rays consistent with a right femoral neck fracture.  No significant anemia on labs no leukocytosis.  Complete metabolic panel without significant abnormalities.  Renal function is normal.  COVID testing and flu testing is negative.  Chest x-ray and head CT without any acute findings.  EKG without any significant abnormalities or arrhythmia abnormalities.  Discussed with Dr. Donata Duff with sports medicine.  They will see patient in consultation.  We will get hospitalist to admit.  Patient is followed by Dr. Lanae Crumbly.  Final Clinical Impression(s) / ED Diagnoses Final diagnoses:  Fall, initial encounter  Closed fracture of right hip, initial  encounter [facility])    Rx / DC Orders ED Discharge Orders     None        Fredia Sorrow, MD 06/12/21 1735

## 2021-06-12 NOTE — H&P (Signed)
History and Physical    Christine Christensen ZSW:[id number] DOB: December 19, 1945 DOA: 06/12/2021  PCP: Christine Ebbs, MD (Confirm with patient/family/NH records and if not entered, this has to be entered at [facility] point of entry) Patient coming from: Home  I have personally briefly reviewed patient's old medical records in [place]  Chief Complaint: Right hip pain  HPI: Christine Christensen is a 75 y.o. female with medical history significant of remote stroke in 2005 and 2008 with chronic right-sided weakness, vascular dementia, chronic ambulation dysfunction on roller walker and cane, Mnire's disease, moderate mitral stenosis, presented with fall and right hip fracture.  Patient reported that she uses roller walker and a cane for ambulation at home at baseline, roller walker for long distance walking cane for short distance.  This morning, in the kitchen, she fell while switching herself from cane to roller walker on the right side, she said she felt lightheaded before this happened.  She denies any chest pain or shortness of breath. No LOC or hitting her head.  Son at bedside reported that last month patient had a similar episode while in the supermarket, patient felt lightheaded then she fell to the right side to the floor.  EMS was called, however upon EMS arrival, patient reported feeling much better and did not want to come to the hospital.  Patient was diagnosed with vascular dementia this year, has been following with neurologist.  Family reported the patient has frequent short memory loss at home.  ED Course: Blood pressure slightly elevated, no tachycardia, afebrile.  X-ray showed mild displaced right femoral head fracture.  CBC, BMP within normal limits.  Sports medicine doctor [place] consulted.  Review of Systems: As per HPI otherwise 14 point review of systems negative.    Past Medical History:  Diagnosis Date   Anemia    history of anemia   Aortic stenosis    mild aortic  stenosis 07/27/17 echo   Arthritis    Heart murmur    Hypertension    Mitral stenosis    moderate mitral stenosis 07/27/17 echo   Stroke [facility])    stroke x2, 2005 and 2008, some right sided weakness   Syncope and collapse 10/25/2017   Urinary incontinence     Past Surgical History:  Procedure Laterality Date   CATARACT EXTRACTION, BILATERAL Bilateral    CHOLECYSTECTOMY     TONSILLECTOMY     TOTAL KNEE ARTHROPLASTY Right    right knee 2006   TOTAL KNEE ARTHROPLASTY Left 08/06/2017   Procedure: LEFT TOTAL KNEE ARTHROPLASTY;  Surgeon: Vickey Huger, MD;  Location: [place];  Service: Orthopedics;  Laterality: Left;   TUBAL LIGATION       reports that she has been smoking cigarettes. She has a 28.00 pack-year smoking history. She has never used smokeless tobacco. She reports current alcohol use. She reports current drug use. Drug: Marijuana.  No Known Allergies  Family History  Problem Relation Age of Onset   Dementia Mother    Heart disease Father    Cerebral palsy Sister      Prior to Admission medications   Medication Sig Start Date End Date Taking? Authorizing Provider  aspirin EC 325 MG EC tablet Take 1 tablet (325 mg total) 2 (two) times daily by mouth. Patient taking differently: Take 325 mg by mouth daily.  08/07/17   Donia Ast, PA  cromolyn (OPTICROM) 4 % ophthalmic solution Place 1 drop into both eyes See admin instructions. Instill one drop in  both eyes two to four times daily. Patient not taking: Reported on 03/24/2020 03/19/18   [provider]  escitalopram (LEXAPRO) 20 MG tablet Take 20 mg daily by mouth.     [provider]  Vitamin D, Ergocalciferol, (DRISDOL) 1.25 MG (50000 UT) CAPS capsule Take 50,000 Units by mouth once a week. Sunday Patient not taking: Reported on 03/24/2020 06/21/18   [provider]    Physical Exam: Vitals:   06/12/21 1421 06/12/21 1515 06/12/21 1705 06/12/21 1709  BP: (!) 170/77 (!) 148/111 140/75   Pulse:  65 67 90   Resp: 16 13 (!) 26   Temp: 98.4 F (36.9 C)     TempSrc: Oral     SpO2: 100% 99% 98%   Weight:    53.1 kg  Height: 5' 3.5" (1.613 m)   5' 3.5" (1.613 m)    Constitutional: NAD, calm, comfortable Vitals:   06/12/21 1421 06/12/21 1515 06/12/21 1705 06/12/21 1709  BP: (!) 170/77 (!) 148/111 140/75   Pulse: 65 67 90   Resp: 16 13 (!) 26   Temp: 98.4 F (36.9 C)     TempSrc: Oral     SpO2: 100% 99% 98%   Weight:    53.1 kg  Height: 5' 3.5" (1.613 m)   5' 3.5" (1.613 m)   Eyes: PERRL, lids and conjunctivae normal ENMT: Mucous membranes are moist. Posterior pharynx clear of any exudate or lesions.Normal dentition.  Neck: normal, supple, no masses, no thyromegaly Respiratory: clear to auscultation bilaterally, no wheezing, no crackles. Normal respiratory effort. No accessory muscle use.  Cardiovascular: Regular rate and rhythm, no murmurs / rubs / gallops. No extremity edema. 2+ pedal pulses. No carotid bruits.  Abdomen: no tenderness, no masses palpated. No hepatosplenomegaly. Bowel sounds positive.  Musculoskeletal: no clubbing / cyanosis. No joint deformity upper and lower extremities. Good ROM, no contractures. Normal muscle tone. Right leg shorted and rotated. Skin: no rashes, lesions, ulcers. No induration Neurologic: CN 2-12 grossly intact. Sensation intact, DTR normal. Strength 5/5 in B/L arms, lower extremities hard to evaluate due to severe pain on the right side. Psychiatric: Normal judgment and insight. Alert and oriented x 3. Normal mood.     Labs on Admission: I have personally reviewed following labs and imaging studies  CBC: Recent Labs  Lab 06/12/21 1530  WBC 8.2  NEUTROABS 7.1  HGB 14.2  HCT 42.3  MCV 88.9  PLT 952   Basic Metabolic Panel: Recent Labs  Lab 06/12/21 1530  NA 139  K 3.7  CL 104  CO2 26  GLUCOSE 101*  BUN 13  CREATININE 0.87  CALCIUM 9.7   GFR: Estimated Creatinine Clearance: 46.8 mL/min (by C-G formula based on SCr of  0.87 mg/dL). Liver Function Tests: Recent Labs  Lab 06/12/21 1530  AST 19  ALT 13  ALKPHOS 60  BILITOT 0.7  PROT 7.6  ALBUMIN 3.9   No results for input(s): LIPASE, AMYLASE in the last 168 hours. No results for input(s): AMMONIA in the last 168 hours. Coagulation Profile: No results for input(s): INR, PROTIME in the last 168 hours. Cardiac Enzymes: No results for input(s): CKTOTAL, CKMB, CKMBINDEX, TROPONINI in the last 168 hours. BNP (last 3 results) No results for input(s): PROBNP in the last 8760 hours. HbA1C: No results for input(s): HGBA1C in the last 72 hours. CBG: No results for input(s): GLUCAP in the last 168 hours. Lipid Profile: No results for input(s): CHOL, HDL, LDLCALC, TRIG, CHOLHDL, LDLDIRECT in the  last 72 hours. Thyroid Function Tests: No results for input(s): TSH, T4TOTAL, FREET4, T3FREE, THYROIDAB in the last 72 hours. Anemia Panel: No results for input(s): VITAMINB12, FOLATE, FERRITIN, TIBC, IRON, RETICCTPCT in the last 72 hours. Urine analysis:    Component Value Date/Time   COLORURINE YELLOW 03/24/2020 2308   APPEARANCEUR CLOUDY (A) 03/24/2020 2308   LABSPEC 1.016 03/24/2020 2308   PHURINE 7.0 03/24/2020 2308   GLUCOSEU NEGATIVE 03/24/2020 2308   HGBUR NEGATIVE 03/24/2020 2308   BILIRUBINUR NEGATIVE 03/24/2020 2308   KETONESUR NEGATIVE 03/24/2020 2308   PROTEINUR NEGATIVE 03/24/2020 2308   NITRITE NEGATIVE 03/24/2020 2308   LEUKOCYTESUR NEGATIVE 03/24/2020 2308    Radiological Exams on Admission: DG Chest 1 View  Result Date: 06/12/2021 CLINICAL DATA:  Fall with right-sided hip pain EXAM: CHEST  1 VIEW COMPARISON:  03/24/2020 FINDINGS: No focal opacity or pleural effusion. Stable cardiomediastinal silhouette with aortic atherosclerosis. No pneumothorax. IMPRESSION: No active disease. Electronically Signed   By: Donavan Foil M.D.   On: 06/12/2021 17:00   CT Head Wo Contrast  Result Date: 06/12/2021 CLINICAL DATA:  Head injury after fall.  EXAM: CT HEAD WITHOUT CONTRAST TECHNIQUE: Contiguous axial images were obtained from the base of the skull through the vertex without intravenous contrast. COMPARISON:  March 24, 2020. FINDINGS: Brain: Mild chronic ischemic white matter disease is noted. No mass effect or midline shift is noted. Ventricular size is within normal limits. There is no evidence of mass lesion, hemorrhage or acute infarction. Vascular: No hyperdense vessel or unexpected calcification. Skull: Normal. Negative for fracture or focal lesion. Sinuses/Orbits: No acute finding. Other: None. IMPRESSION: No acute intracranial abnormality seen. Electronically Signed   By: Marijo Conception M.D.   On: 06/12/2021 16:02   DG Hip Unilat W or Wo Pelvis 2-3 Views Right  Result Date: 06/12/2021 CLINICAL DATA:  Right hip pain after fall. EXAM: DG HIP (WITH OR WITHOUT PELVIS) 2-3V RIGHT COMPARISON:  None. FINDINGS: Moderately displaced proximal right femoral neck fracture is noted. Left hip is unremarkable. IMPRESSION: Moderately displaced proximal right femoral neck fracture. Electronically Signed   By: Marijo Conception M.D.   On: 06/12/2021 17:00    EKG: Independently reviewed.   Assessment/Plan Active Problems:   Closed right hip fracture, initial encounter [facility])   Hip fracture ([place])  (please populate well all problems here in Problem List. (For example, if patient is on BP meds at home and you resume or decide to hold them, it is a problem that needs to be her. Same for CAD, COPD, HLD and so on)  Right hip fracture -Discussed with on-call orthopedic surgery Dr. Zachery Dakins, who requested patient transferred to [facility] long hospital for the OR. -See discussion below for cardiology evaluation. -Pain control. -DVT prophylaxis starting tomorrow. -Start beta-blocker low-dose.  Near syncope versus syncope -Patient has history of significant memory loss and could not tell more about she had syncope versus near syncope today other than "light  headed".   -However reviewed patient echo showing in 2018, echo cardiogram showed patient had moderate mitral stenosis.  Family confirmed that patient had several episodes of near syncope recently which involves one episode last month, when patient complained about lightheadedness [place]  EMS arrived on scene and recommended patient come to hospital for evaluation which patient declined and went home. -Family also reported that patient has poor excise tolerance with only 5-10 min walk and then start to feel SOB. -Consult with on-call cardiology fellow  Dr. Kalman Shan who reviewed patient's echocardiogram 2018 which showed not moderate but severe MS, which could explain most of patient's recent symptoms and which potentially also put patient significant increased risks for general anesthesia for the incoming hip surgery.  Cardiology recommend per-op echocardiogram and will consult on patient tomorrow. I then updated ortho about the cardio's opion, ortho agreed to postpone OR while waiting for cardio evaluation.  History of moderate to severe mitral stenosis -As above.  Hx of CVA x2 -No significant focal neurodeficit today, symptoms were compatible with near syncope and TIA.  CT head no acute findings. -Continue ASA.  Mnire's's disease -Hard to differentiate from near syncope.  Work-up as above.   DVT prophylaxis: Lovenox Code Status: Full code Family Communication: Son at bedside Disposition Plan: Expect more than 2 midnight hospital stay Consults called: Cardio Dr. Kalman Shan, Ortho Dr. Zachery Dakins Admission status: Tele admit   Lequita Halt MD [facility] Pager [phone]  06/12/2021, 6:28 PM

## 2021-06-13 ENCOUNTER — Encounter (HOSPITAL_COMMUNITY): Payer: Self-pay | Admitting: Internal Medicine

## 2021-06-13 ENCOUNTER — Inpatient Hospital Stay (HOSPITAL_COMMUNITY): Payer: Medicare (Managed Care)

## 2021-06-13 ENCOUNTER — Encounter (HOSPITAL_COMMUNITY)
Admission: EM | Disposition: A | Discharge status: Skilled Nursing Facility | Payer: Self-pay | Source: Home / Self Care | Attending: Internal Medicine

## 2021-06-13 ENCOUNTER — Other Ambulatory Visit: Payer: Self-pay

## 2021-06-13 DIAGNOSIS — W19XXXA Unspecified fall, initial encounter: Secondary | ICD-10-CM | POA: Diagnosis not present

## 2021-06-13 DIAGNOSIS — Z01810 Encounter for preprocedural cardiovascular examination: Secondary | ICD-10-CM

## 2021-06-13 DIAGNOSIS — S72001A Fracture of unspecified part of neck of right femur, initial encounter for closed fracture: Secondary | ICD-10-CM | POA: Diagnosis not present

## 2021-06-13 DIAGNOSIS — R55 Syncope and collapse: Secondary | ICD-10-CM | POA: Diagnosis not present

## 2021-06-13 DIAGNOSIS — S72001D Fracture of unspecified part of neck of right femur, subsequent encounter for closed fracture with routine healing: Secondary | ICD-10-CM

## 2021-06-13 DIAGNOSIS — I350 Nonrheumatic aortic (valve) stenosis: Secondary | ICD-10-CM

## 2021-06-13 DIAGNOSIS — I342 Nonrheumatic mitral (valve) stenosis: Secondary | ICD-10-CM | POA: Diagnosis not present

## 2021-06-13 DIAGNOSIS — I050 Rheumatic mitral stenosis: Secondary | ICD-10-CM | POA: Diagnosis not present

## 2021-06-13 DIAGNOSIS — E43 Unspecified severe protein-calorie malnutrition: Secondary | ICD-10-CM | POA: Diagnosis present

## 2021-06-13 LAB — ECHOCARDIOGRAM COMPLETE
AR max vel: 1.56 cm2
AV Area VTI: 1.28 cm2
AV Area mean vel: 1.51 cm2
AV Mean grad: 10.4 mmHg
AV Peak grad: 20.2 mmHg
Ao pk vel: 2.25 m/s
Area-P 1/2: 2.73 cm2
Calc EF: 75.8 %
Height: 63.5 in
MV VTI: 0.57 cm2
S' Lateral: 2.3 cm
Single Plane A2C EF: 79.9 %
Single Plane A4C EF: 72.8 %
Weight: 1872 oz

## 2021-06-13 LAB — SURGICAL PCR SCREEN
MRSA, PCR: NEGATIVE
Staphylococcus aureus: NEGATIVE

## 2021-06-13 SURGERY — HEMIARTHROPLASTY, HIP, DIRECT ANTERIOR APPROACH, FOR FRACTURE
Anesthesia: General | Site: Hip | Laterality: Right

## 2021-06-13 MED ORDER — ADULT MULTIVITAMIN W/MINERALS CH
1.0000 | ORAL_TABLET | Freq: Every day | ORAL | Status: DC
  Administered 2021-06-13 – 2021-06-29 (×14): 1 via ORAL
  Filled 2021-06-13 (×15): qty 1

## 2021-06-13 MED ORDER — ENSURE SURGERY PO LIQD
237.0000 mL | Freq: Two times a day (BID) | ORAL | Status: DC
  Administered 2021-06-13 – 2021-06-19 (×6): 237 mL via ORAL
  Filled 2021-06-13 (×13): qty 237

## 2021-06-13 MED ORDER — SODIUM CHLORIDE 0.9 % IV SOLN: INTRAVENOUS | Status: AC

## 2021-06-13 NOTE — Care Plan (Addendum)
Called by hospitalist, Dr. Roosevelt Locks, regarding pre-operative evaluation for [name]. She had a fall at home and suffered a right femoral neck fracture and operative management was recommended. She has documented moderate mitral stenosis and Dr. Roosevelt Locks was concerned about what sounded like exertional dyspnea and with near syncope. I reviewed her last TTE from 2018 personally, and it appears severe by updated 2020 guidelines. The reported symptoms would make her mitral stenosis stage D, in which surgery is typically indicated for appropriate candidates. The valve is not amenable to balloon angioplasty. I am not sure how suitable she would be for such a large surgery, as prior to me being able to see the patient, it appears she was unfortunately transferred to [facility] for unclear reasons. This consultation will be deferred to tomorrow. Please obtain an updated TTE in the interim. She will need cardiology evaluation and the echo (at least) prior to proceeding with surgery. Please keep her NPO after midnight.

## 2021-06-13 NOTE — Progress Notes (Addendum)
PROGRESS NOTE   Christine Christensen  IOE:[id number]    DOB: 1946/08/16    DOA: 06/12/2021  PCP: Nolene Ebbs, MD   I have briefly reviewed patients previous medical records in [facility].  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  Son reported similar episode when she felt lightheaded and fell in a supermarket and EMS was called.  Patient now admitted for mildly displaced right femoral neck fracture.  Repeat echo showed severe mitral valve stenosis and mild aortic valve stenosis.  Cardiology consulted for preoperative evaluation and indicate that she is a high-very high risk (nonmodifiable) for intermediate risk procedure, have started preop beta-blockers, recommend involvement of a cardiac anesthetist and no further work-up indicated.  Addendum: Anesthetist and orthopedics advised this afternoon that patient needs to be transferred back to [facility] due to high surgical risk, need for close monitoring and postop likely ICU for close monitoring.  I discussed with orthopedic MD.  Diet initiated now and n.p.o. after midnight.   Assessment & Plan:  Active Problems:   Closed right hip fracture, initial encounter ([place])   Hip fracture ([place])   Displaced right femoral neck fracture: - Sustained after possible near syncope and mechanical fall at home. - Cardiology has consulted for preop evaluation, reviewed her repeat echo and indicate that she is a high-very high risk (nonmodifiable) for intermediate risk procedure, have started preop beta-blockers, recommend involvement  of a cardiac anesthetist and no further work-up indicated. -Orthopedics input appreciated.  Cardiology is directly communicated with orthopedics.  Patient's son will speak with orthopedics regarding pursuing surgery  Severe calcified mitral valve stenosis with severe pulmonary hypertension and mild aortic valve stenosis - Cardiology evaluation and management as noted above.  Atrial tachycardia or SVT, paroxysmal - Likely related to severe mitral valve stenosis. - Cardiology has started metoprolol 12.5 Mg twice daily, to be continued perioperatively as well.  Near syncope versus syncope and falls - Secondary to severe mitral valve stenosis, advanced age and chronic ambulatory dysfunction from CVAs and right hemiparesis - Certainly will need SNF for rehab post op.  Prior CVAs x2 with residual right hemiparesis - Therapy evaluation postsurgery. - CTA head without acute findings. - Continue aspirin  Essential hypertension - Controlled.  Avoid hypotension.  Body mass index is 20.4 kg/m.  Nutrition Status: Nutrition Problem: Severe Malnutrition Etiology: chronic illness (dementia and CVA) Signs/Symptoms: severe fat depletion, severe muscle depletion Interventions: Ensure Surgery, MVI       DVT prophylaxis: enoxaparin (LOVENOX) injection 40 mg Start: 06/13/21 1000     Code Status: Full Code Family Communication:  Disposition:  Status is: Inpatient  Remains inpatient appropriate because:Inpatient level of care appropriate due to severity of illness  Dispo: The patient is from: Home              Anticipated d/c is to: SNF              Patient currently is not medically stable to d/c.   Difficult to place patient No        Consultants:   Orthopedics Cardiology  Procedures:   None  Antimicrobials:  Anti-infectives (From admission, onward)    None         Subjective:  Patient somewhat somnolent but arousable when I saw her this morning.  Oriented only to  self.  "I am in a shack".  States that she did fall.  Unable to get any further history from her  Objective:   Vitals:   06/13/21 0038 06/13/21 0131 06/13/21 0515 06/13/21 1248  BP: (!) 151/92 (!) 149/77 (!) 142/76 117/71  Pulse: 92 85 75 68  Resp: 16  18 18   Temp: 97.9 F (36.6 C)  99.5 F (37.5 C) 98.5 F (36.9 C)  TempSrc: Oral  Oral Oral  SpO2: 100%  97% 98%  Weight:      Height:        General exam: Elderly female, small built, frail and chronically ill looking lying comfortably propped up in bed without distress.  Oral mucosa moist. Respiratory system: Clear to auscultation. Respiratory effort normal. Cardiovascular system: S1 & S2 heard, RRR.  No JVD or pedal edema.  3/6 systolic ejection murmur best heard at apex.  Telemetry personally reviewed: Sinus rhythm.  Nonsustained SVT. Gastrointestinal system: Abdomen is nondistended, soft and nontender. No organomegaly or masses felt. Normal bowel sounds heard. Central nervous system: Mental status as noted above.  No focal neurological deficits. Extremities: Right upper extremity with grade 4 x 5 power at least.?  Contracture of fingers.  Left extremities seem normal power.  Right lower extremity shortened and internally rotated.  No features of compartment syndrome and neurovascular bundle appears intact. Skin: No rashes, lesions or ulcers Psychiatry: Judgement and insight impaired. Mood & affect appropriate.     Data Reviewed:   I have personally reviewed following labs and imaging studies   CBC: Recent Labs  Lab 06/12/21 1530  WBC 8.2  NEUTROABS 7.1  HGB 14.2  HCT 42.3  MCV 88.9  PLT 409    Basic Metabolic Panel: Recent Labs  Lab 06/12/21 1530  NA 139  K 3.7  CL 104  CO2 26  GLUCOSE 101*  BUN 13  CREATININE 0.87  CALCIUM 9.7    Liver Function Tests: Recent Labs  Lab 06/12/21 1530  AST 19  ALT 13  ALKPHOS 60  BILITOT 0.7  PROT 7.6  ALBUMIN 3.9    CBG: No results for input(s): GLUCAP in the  last 168 hours.  Microbiology Studies:   Recent Results (from the past 240 hour(s))  Resp Panel by RT-PCR (Flu A&B, Covid) Nasopharyngeal Swab     Status: None   Collection Time: 06/12/21  3:30 PM   Specimen: Nasopharyngeal Swab; Nasopharyngeal(NP) swabs in vial transport medium  Result Value Ref Range Status   SARS Coronavirus 2 by RT PCR NEGATIVE NEGATIVE Final    Comment: (NOTE) SARS-CoV-2 target nucleic acids are NOT DETECTED.  The SARS-CoV-2 RNA is generally detectable in upper respiratory specimens during the acute phase of infection. The lowest concentration of SARS-CoV-2 viral copies this assay can detect is 138 copies/mL. A negative result does not preclude SARS-Cov-2 infection and should not be used as the sole basis for treatment or other patient management decisions. A negative result may occur with  improper specimen collection/handling, submission of specimen other than nasopharyngeal swab, presence of viral mutation(s) within the areas targeted by this assay, and inadequate number of viral copies(<138 copies/mL). A negative result must be combined with clinical observations, patient history, and epidemiological information. The expected result is Negative.  Fact Sheet for Patients:  EntrepreneurPulse.com.au  Fact Sheet for Healthcare Providers:  IncredibleEmployment.be  This test is no t yet approved or cleared by the [place] FDA and  has been authorized for detection and/or diagnosis of SARS-CoV-2 by FDA under an Emergency Use Authorization (EUA). This EUA will remain  in effect (meaning this test can be used) for the duration of the COVID-19 declaration under Section 564(b)(1) of the Act, 21 U.S.C.section 360bbb-3(b)(1), unless the authorization is terminated  or revoked sooner.       Influenza A by PCR NEGATIVE NEGATIVE Final   Influenza B by PCR NEGATIVE NEGATIVE Final    Comment: (NOTE) The Xpert Xpress  SARS-CoV-2/FLU/RSV plus assay is intended as an aid in the diagnosis of influenza from Nasopharyngeal swab specimens and should not be used as a sole basis for treatment. Nasal washings and aspirates are unacceptable for Xpert Xpress SARS-CoV-2/FLU/RSV testing.  Fact Sheet for Patients: EntrepreneurPulse.com.au  Fact Sheet for Healthcare Providers: IncredibleEmployment.be  This test is not yet approved or cleared by the [place] FDA and has been authorized for detection and/or diagnosis of SARS-CoV-2 by FDA under an Emergency Use Authorization (EUA). This EUA will remain in effect (meaning this test can be used) for the duration of the COVID-19 declaration under Section 564(b)(1) of the Act, 21 U.S.C. section 360bbb-3(b)(1), unless the authorization is terminated or revoked.  Performed at [facility], [place] [address], [place], [place] [address]   Surgical pcr screen     Status: None   Collection Time: 06/13/21 10:14 AM   Specimen: Nasal Mucosa; Nasal Swab  Result Value Ref Range Status   MRSA, PCR NEGATIVE NEGATIVE Final   Staphylococcus aureus NEGATIVE NEGATIVE Final    Comment: (NOTE) The Xpert SA Assay (FDA approved for NASAL specimens in patients 49 years of age and older), is one component of a comprehensive surveillance program. It is not intended to diagnose infection nor to guide or monitor treatment. Performed at [facility], [place] [address] [place], [place] [address]      Radiology Studies:  DG Chest 1 View  Result Date: 06/12/2021 CLINICAL DATA:  Fall with right-sided hip pain EXAM: CHEST  1 VIEW COMPARISON:  03/24/2020 FINDINGS: No focal opacity or pleural effusion. Stable cardiomediastinal silhouette with aortic atherosclerosis. No pneumothorax. IMPRESSION: No active disease. Electronically Signed   By: Donavan Foil M.D.   On: 06/12/2021 17:00   CT Head Wo Contrast  Result Date:  06/12/2021 CLINICAL DATA:  Head injury after fall. EXAM: CT HEAD WITHOUT CONTRAST TECHNIQUE: Contiguous axial images were obtained from the base of the skull through the vertex without intravenous contrast. COMPARISON:  March 24, 2020. FINDINGS: Brain: Mild chronic ischemic white matter disease is noted. No mass effect or midline shift is noted. Ventricular size is within normal limits. There is no evidence of mass lesion, hemorrhage or acute infarction. Vascular: No hyperdense vessel or unexpected calcification. Skull: Normal. Negative for fracture or focal lesion. Sinuses/Orbits: No acute finding. Other: None. IMPRESSION: No acute intracranial abnormality seen. Electronically Signed   By: Marijo Conception M.D.   On: 06/12/2021 16:02   DG Ankle Right Port  Result Date: 06/13/2021 CLINICAL DATA:  Pain EXAM: PORTABLE RIGHT ANKLE - 2 VIEW COMPARISON:  None. FINDINGS: There is no evidence of fracture, dislocation, or joint effusion. There is no evidence of arthropathy or other focal bone abnormality. Soft tissue edema about the medial foot. IMPRESSION: No fracture or dislocation of the right ankle. Soft tissue edema about the  medial foot. Electronically Signed   By: Eddie Candle M.D.   On: 06/13/2021 13:21   ECHOCARDIOGRAM COMPLETE  Result Date: 06/13/2021    ECHOCARDIOGRAM REPORT   Patient Name:   BABETTA PATERSON [facility] Date of Exam: 06/13/2021 Medical Rec #:  [id number]        Height:       63.5 in Accession #:    [id number]       Weight:       117.0 lb Date of Birth:  1946/04/30         BSA:          1.549 m Patient Age:    24 years         BP:           142/76 mmHg Patient Gender: F                HR:           71 bpm. Exam Location:  Inpatient Procedure: 2D Echo, 3D Echo, Cardiac Doppler and Color Doppler                                 MODIFIED REPORT:   This report was modified by Lyman Bishop MD on 06/13/2021 due to Noted LVEDP      elevated - however, difficult to quantify due to heavy mitral annular               calcification - suspect LVEDP value was overestimated.  Indications:     R55 Syncope  History:         Patient has prior history of Echocardiogram examinations, most                  recent 07/27/2017. Stroke, Aortic Valve Disease and Mitral Valve                  Disease, Signs/Symptoms:Murmur and Syncope; Risk                  Factors:Hypertension. Mitral and aortic stenosis.  Sonographer:     Roseanna Rainbow RDCS Referring Phys:  [id number] Lequita Halt Diagnosing Phys: Lyman Bishop MD IMPRESSIONS  1. Left ventricular ejection fraction, by estimation, is >75%. Left ventricular ejection fraction by 2D MOD biplane is 75.8 %. The left ventricle has hyperdynamic function. The left ventricle has no regional wall motion abnormalities. There is mild left  ventricular hypertrophy. Left ventricular diastolic parameters are consistent with Grade II diastolic dysfunction (pseudonormalization). Elevated left ventricular end-diastolic pressure.  2. Right ventricular systolic function is hyperdynamic. The right ventricular size is normal. There is severely elevated pulmonary artery systolic pressure. The estimated right ventricular systolic pressure is 36.6 mmHg.  3. The mitral valve is abnormal. Trivial mitral valve regurgitation. Severe mitral stenosis. The mean mitral valve gradient is 18.0 mmHg with average heart rate of 79 bpm. Severe mitral annular calcification.  4. There is heavy calcification of the aortic root uncluding a calcificed mass below the aortic valve annulus in the aorta/mitral curtain. The aortic valve is tricuspid. There is mild calcification of the aortic valve. Aortic valve regurgitation is not visualized. Mild aortic valve stenosis. Aortic valve mean gradient measures 10.4 mmHg. AVA around 1.56 cm2, DI 0.50  5. The inferior vena cava is normal in size with <50% respiratory variability, suggesting right atrial pressure of 8 mmHg. Comparison(s): Changes from prior study are noted. 07/27/2017: LVEF  60-65%, mild  LVH, grade 1 DD with elevated LVEDP, Moderate MS - mean gradient 10 mmHg, mild AS - mean gradient 10 mmHg. Conclusion(s)/Recommendation(s): There is severe mitral stenosis with severe pulmonary hypertension and high LV filling pressure - the aortic valve appears unchanged, however, there is heavy aortic root calcification - consider CT surgery consultation. FINDINGS  Left Ventricle: Left ventricular ejection fraction, by estimation, is >75%. Left ventricular ejection fraction by 2D MOD biplane is 75.8 %. The left ventricle has hyperdynamic function. The left ventricle has no regional wall motion abnormalities. 3D left ventricular ejection fraction analysis performed but not reported based on interpreter judgement due to suboptimal quality. The left ventricular internal cavity size was normal in size. There is mild left ventricular hypertrophy. Left ventricular diastolic parameters are consistent with Grade II diastolic dysfunction (pseudonormalization). Elevated left ventricular end-diastolic pressure. Right Ventricle: The right ventricular size is normal. No increase in right ventricular wall thickness. Right ventricular systolic function is hyperdynamic. There is severely elevated pulmonary artery systolic pressure. The tricuspid regurgitant velocity  is 3.96 m/s, and with an assumed right atrial pressure of 8 mmHg, the estimated right ventricular systolic pressure is 48.1 mmHg. Left Atrium: Left atrial size was normal in size. Right Atrium: Right atrial size was normal in size. Pericardium: There is no evidence of pericardial effusion. Mitral Valve: The mitral valve is abnormal. Severe mitral annular calcification. Trivial mitral valve regurgitation. Severe mitral valve stenosis. MV peak gradient, 31.9 mmHg. The mean mitral valve gradient is 18.0 mmHg with average heart rate of 79 bpm. Tricuspid Valve: The tricuspid valve is grossly normal. Tricuspid valve regurgitation is mild. Aortic Valve: There is heavy  calcification of the aortic root uncluding a calcificed mass below the aortic valve annulus in the aorta/mitral curtain. The aortic valve is tricuspid. There is mild calcification of the aortic valve. There is moderate to severe aortic valve annular calcification. Aortic valve regurgitation is not visualized. Mild aortic stenosis is present. Aortic valve mean gradient measures 10.4 mmHg. Aortic valve peak gradient measures 20.2 mmHg. Aortic valve area, by VTI measures 1.28 cm. Pulmonic Valve: The pulmonic valve was grossly normal. Pulmonic valve regurgitation is trivial. Aorta: The aortic root and ascending aorta are structurally normal, with no evidence of dilitation. Venous: The inferior vena cava is normal in size with less than 50% respiratory variability, suggesting right atrial pressure of 8 mmHg. IAS/Shunts: There is right bowing of the interatrial septum, suggestive of elevated left atrial pressure. No atrial level shunt detected by color flow Doppler.  LEFT VENTRICLE PLAX 2D                        Biplane EF (MOD) LVIDd:         3.50 cm         LV Biplane EF:   Left LVIDs:         2.30 cm                          ventricular LV PW:         0.90 cm                          ejection LV IVS:        1.30 cm  fraction by LVOT diam:     1.80 cm                          2D MOD LV SV:         56                               biplane is LV SV Index:   36                               75.8 %. LVOT Area:     2.54 cm                                Diastology                                LV e' medial:    3.48 cm/s LV Volumes (MOD)               LV E/e' medial:  72.7 LV vol d, MOD    24.5 ml       LV e' lateral:   4.65 cm/s A2C:                           LV E/e' lateral: 54.4 LV vol d, MOD    45.9 ml A4C: LV vol s, MOD    4.9 ml A2C: LV vol s, MOD    12.5 ml       3D Volume EF: A4C:                           3D EF:        66 % LV SV MOD A2C:   19.6 ml       LV EDV:       73 ml LV SV MOD A4C:    45.9 ml       LV ESV:       25 ml LV SV MOD BP:    25.6 ml       LV SV:        48 ml RIGHT VENTRICLE             IVC RV S prime:     13.70 cm/s  IVC diam: 1.40 cm TAPSE (M-mode): 2.1 cm LEFT ATRIUM             Index       RIGHT ATRIUM           Index LA diam:        4.40 cm 2.84 cm/m  RA Area:     11.70 cm LA Vol (A2C):   40.5 ml 26.15 ml/m RA Volume:   24.80 ml  16.01 ml/m LA Vol (A4C):   48.1 ml 31.06 ml/m LA Biplane Vol: 45.0 ml 29.06 ml/m  AORTIC VALVE AV Area (Vmax):    1.56 cm AV Area (Vmean):   1.51 cm AV Area (VTI):     1.28 cm AV Vmax:           224.80 cm/s AV Vmean:          144.000 cm/s  AV VTI:            0.434 m AV Peak Grad:      20.2 mmHg AV Mean Grad:      10.4 mmHg LVOT Vmax:         138.00 cm/s LVOT Vmean:        85.400 cm/s LVOT VTI:          0.219 m LVOT/AV VTI ratio: 0.50  AORTA Ao Root diam: 2.70 cm Ao Asc diam:  2.80 cm MITRAL VALVE                TRICUSPID VALVE MV Area (PHT): 2.73 cm     TR Peak grad:   62.7 mmHg MV Area VTI:   0.57 cm     TR Vmax:        396.00 cm/s MV Peak grad:  31.9 mmHg MV Mean grad:  18.0 mmHg    SHUNTS MV Vmax:       2.82 m/s     Systemic VTI:  0.22 m MV Vmean:      197.0 cm/s   Systemic Diam: 1.80 cm MV Decel Time: 278 msec MV E velocity: 253.00 cm/s MV A velocity: 238.00 cm/s MV E/A ratio:  1.06 Lyman Bishop MD Electronically signed by Lyman Bishop MD Signature Date/Time: 06/13/2021/10:35:48 AM    Final (Updated)    DG Hip Unilat W or Wo Pelvis 2-3 Views Right  Result Date: 06/12/2021 CLINICAL DATA:  Right hip pain after fall. EXAM: DG HIP (WITH OR WITHOUT PELVIS) 2-3V RIGHT COMPARISON:  None. FINDINGS: Moderately displaced proximal right femoral neck fracture is noted. Left hip is unremarkable. IMPRESSION: Moderately displaced proximal right femoral neck fracture. Electronically Signed   By: Marijo Conception M.D.   On: 06/12/2021 17:00     Scheduled Meds:    aspirin  325 mg Oral Daily   enoxaparin (LOVENOX) injection  40 mg Subcutaneous Q24H    escitalopram  20 mg Oral Daily   metoprolol tartrate  12.5 mg Oral BID    Continuous Infusions:    sodium chloride Stopped (06/13/21 1445)     LOS: 1 day     Vernell Leep, MD, [facility], [facility]. [facility]    To contact the attending provider between 7A-7P or the covering provider during after hours 7P-7A, please log into the web site [email or web address] and access using universal [place] password for that web site. If you do not have the password, please call the hospital operator.  06/13/2021, 2:51 PM

## 2021-06-13 NOTE — Plan of Care (Signed)
  Problem: Education: Goal: Knowledge of General Education information will improve Description: Including pain rating scale, medication(s)/side effects and non-pharmacologic comfort measures Outcome: Completed/Met

## 2021-06-13 NOTE — Progress Notes (Signed)
Orthopedic Tech Progress Note Patient Details:  Christine Christensen [birth year] [id number]  Ortho Devices Ortho Device/Splint Location: trapeze bar Ortho Device/Splint Interventions: Application   Post Interventions Patient Tolerated: Fair Instructions Provided: Care of device  [name] 06/13/2021, 10:32 AM

## 2021-06-13 NOTE — Progress Notes (Signed)
Report called to [facility]. [facility] notified for transport. Talley Casco, Laurel Dimmer, RN

## 2021-06-13 NOTE — Progress Notes (Signed)
Initial Nutrition Assessment  DOCUMENTATION CODES:   Severe malnutrition in context of chronic illness  INTERVENTION:  - diet advancement as medically feasible post-op. - will order Ensure Surgery BID, each supplement provides 350 kcal and 20 grams of protein. - will order 1 tablet multivitamin with minerals/day.   NUTRITION DIAGNOSIS:   Severe Malnutrition related to chronic illness (dementia and CVA) as evidenced by severe fat depletion, severe muscle depletion.  GOAL:   Patient will meet greater than or equal to 90% of their needs  MONITOR:   Diet advancement, PO intake, Supplement acceptance, Labs, Weight trends, Skin  REASON FOR ASSESSMENT:   Consult Hip fracture protocol  ASSESSMENT:   75 y.o. female with medical history of remote stroke in 2005 and 2008 with chronic R-sided weakness, vascular dementia, chronic ambulation dysfunction on roller walker and cane, Mnire's disease, and moderate mitral stenosis. She presented to the ED after a fall with associated R hip fx.  Diet changed from Heart Healthy to NPO at midnight. Patient laying in bed with son at bedside. Son provides all information.   Patient and son live together. She has a 4-pronged cane and a walker at home to aid in ambulation and reported falling when she was switching between the two on 9/25.   Her appetite has been decreased for an unknown number of months. PCP prescribed appetite stimulant ~1 month ago and no noticeable affect was noticed until this weekend.   Son always has things available for patient to snack on such as nuts, crackers and cheese, water, coffee, Ensure supplements, and fruit cups.   She is able to feed herself and does not have any difficulty with chewing or swallowing anything that son is aware of.   Weight yesterday was 117 lb and PTA the most recently documented weight was on 07/27/18 when she weighed 161 lb. This indicates 44 lb weight loss (27% body weight). Unable to  determine if weight loss occurred more acutely.  She is pending R hemiarthroplasty.    Labs reviewed. Medications reviewed. IVF; NS @ 75 ml/hr.     NUTRITION - FOCUSED PHYSICAL EXAM:  Flowsheet Row Most Recent Value  Orbital Region Moderate depletion  Upper Arm Region Severe depletion  Thoracic and Lumbar Region Unable to assess  Buccal Region Severe depletion  Temple Region Severe depletion  Clavicle Bone Region Severe depletion  Clavicle and Acromion Bone Region Severe depletion  Scapular Bone Region Unable to assess  Dorsal Hand Moderate depletion  Patellar Region Unable to assess  Anterior Thigh Region Unable to assess  Posterior Calf Region Unable to assess  Edema (RD Assessment) Unable to assess  Hair Reviewed  Eyes Reviewed  Mouth Unable to assess  Skin Reviewed  Nails Reviewed       Diet Order:   Diet Order             Diet NPO time specified Except for: Sips with Meds  Diet effective now                   EDUCATION NEEDS:   No education needs have been identified at this time  Skin:  Skin Assessment: Reviewed RN Assessment  Last BM:  9/25 (type 4 x1)  Height:   Ht Readings from Last 1 Encounters:  06/12/21 5' 3.5" (1.613 m)    Weight:   Wt Readings from Last 1 Encounters:  06/12/21 53.1 kg    Estimated Nutritional Needs:  Kcal:  1650-1850 kcal Protein:  80-95 grams  Fluid:  >/= 1.7 L/day     Jarome Matin, MS, RD, LDN, CNSC Inpatient Clinical Dietitian RD pager # available in AMION  After hours/weekend pager # available in [facility]

## 2021-06-13 NOTE — Progress Notes (Signed)
  Echocardiogram 2D Echocardiogram has been performed.  Christine Christensen 06/13/2021, 9:55 AM

## 2021-06-13 NOTE — Progress Notes (Signed)
     R displaced femoral neck fx plan for OR for hemiarthroplasty  Subjective:  Patient more alert and able to answer some questions but still very drowsy and falling asleep during conversation. No family at bedside at this time. Endorses pain in the R leg, but unable to localize.  Objective:   VITALS:   Vitals:   06/12/21 2109 06/13/21 0038 06/13/21 0131 06/13/21 0515  BP: 138/70 (!) 151/92 (!) 149/77 (!) 142/76  Pulse: 79 92 85 75  Resp: 14 16  18   Temp: 98.2 F (36.8 C) 97.9 F (36.6 C)  99.5 F (37.5 C)  TempSrc: Oral Oral  Oral  SpO2: 100% 100%  97%  Weight:      Height:       Skin intact with no abrasions or lesions about the right hip. Logroll deferred given known hip fracture Nontender to palpation about the knee with no effusion well-healed anterior incision Holds foot in an everted position, unable to demonstrate active dorsiflexion, patient notes some discomfort with passive range of motion of the ankle, and some tenderness palpation    Lab Results  Component Value Date   WBC 8.2 06/12/2021   HGB 14.2 06/12/2021   HCT 42.3 06/12/2021   MCV 88.9 06/12/2021   PLT 219 06/12/2021   BMET    Component Value Date/Time   NA 139 06/12/2021 1530   K 3.7 06/12/2021 1530   CL 104 06/12/2021 1530   CO2 26 06/12/2021 1530   GLUCOSE 101 (H) 06/12/2021 1530   BUN 13 06/12/2021 1530   CREATININE 0.87 06/12/2021 1530   CREATININE 0.75 03/03/2021 1053   CALCIUM 9.7 06/12/2021 1530   GFRNONAA >60 06/12/2021 1530   GFRNONAA 78 03/03/2021 1053     Assessment/Plan:     Active Problems:   Closed right hip fracture, initial encounter ([place])   Hip fracture ([place]) Displaced right femoral neck fracture  Plan to proceed to the OR for hemiarthroplasty today versus tomorrow pending medical clearance and cardiology clearance.   Will obtain also x-rays of the right ankle to evaluate for fracture after her fall     [place] 06/13/2021, 9:25 AM   Charlies Constable, MD Cell [phone]

## 2021-06-14 ENCOUNTER — Inpatient Hospital Stay (HOSPITAL_COMMUNITY): Payer: Medicare (Managed Care) | Admitting: Anesthesiology

## 2021-06-14 ENCOUNTER — Encounter (HOSPITAL_COMMUNITY)
Admission: EM | Disposition: A | Discharge status: Skilled Nursing Facility | Payer: Self-pay | Source: Home / Self Care | Attending: Internal Medicine

## 2021-06-14 ENCOUNTER — Inpatient Hospital Stay (HOSPITAL_COMMUNITY): Payer: Medicare (Managed Care)

## 2021-06-14 DIAGNOSIS — S72001D Fracture of unspecified part of neck of right femur, subsequent encounter for closed fracture with routine healing: Secondary | ICD-10-CM | POA: Diagnosis not present

## 2021-06-14 DIAGNOSIS — W19XXXA Unspecified fall, initial encounter: Secondary | ICD-10-CM | POA: Diagnosis not present

## 2021-06-14 HISTORY — PX: HIP ARTHROPLASTY: SHX981

## 2021-06-14 LAB — BASIC METABOLIC PANEL
Anion gap: 9 (ref 5–15)
BUN: 12 mg/dL (ref 8–23)
CO2: 22 mmol/L (ref 22–32)
Calcium: 8.9 mg/dL (ref 8.9–10.3)
Chloride: 104 mmol/L (ref 98–111)
Creatinine, Ser: 0.72 mg/dL (ref 0.44–1.00)
GFR, Estimated: >60 mL/min (ref >60)
Glucose, Bld: 94 mg/dL (ref 70–99)
Potassium: 3.5 mmol/L (ref 3.5–5.1)
Sodium: 135 mmol/L (ref 135–145)

## 2021-06-14 LAB — CBC
HCT: 35.7 % — ABNORMAL LOW (ref 36.0–46.0)
Hemoglobin: 12 g/dL (ref 12.0–15.0)
MCH: 30 pg (ref 26.0–34.0)
MCHC: 33.6 g/dL (ref 30.0–36.0)
MCV: 89.3 fL (ref 80.0–100.0)
Platelets: 159 10*3/uL (ref 150–400)
RBC: 4 MIL/uL (ref 3.87–5.11)
RDW: 14.6 % (ref 11.5–15.5)
WBC: 9 10*3/uL (ref 4.0–10.5)
nRBC: 0 % (ref 0.0–0.2)

## 2021-06-14 LAB — TYPE AND SCREEN
ABO/RH(D): AB POS
Antibody Screen: NEGATIVE

## 2021-06-14 LAB — ABO/RH: ABO/RH(D): AB POS

## 2021-06-14 SURGERY — HEMIARTHROPLASTY, HIP, DIRECT ANTERIOR APPROACH, FOR FRACTURE
Anesthesia: General | Site: Hip | Laterality: Right

## 2021-06-14 MED ORDER — ONDANSETRON HCL 4 MG/2ML IJ SOLN
INTRAMUSCULAR | Status: AC
  Filled 2021-06-14: qty 2

## 2021-06-14 MED ORDER — PROPOFOL 10 MG/ML IV BOLUS
INTRAVENOUS | Status: AC
  Filled 2021-06-14: qty 20

## 2021-06-14 MED ORDER — DEXAMETHASONE SODIUM PHOSPHATE 10 MG/ML IJ SOLN
INTRAMUSCULAR | Status: DC | PRN
Start: 2021-06-14 — End: 2021-06-14
  Administered 2021-06-14: 5 mg via INTRAVENOUS

## 2021-06-14 MED ORDER — ONDANSETRON HCL 4 MG/2ML IJ SOLN
INTRAMUSCULAR | Status: DC | PRN
  Administered 2021-06-14: 4 mg via INTRAVENOUS

## 2021-06-14 MED ORDER — CHLORHEXIDINE GLUCONATE 0.12 % MT SOLN
15.0000 mL | Freq: Once | OROMUCOSAL | Status: AC
  Administered 2021-06-14: 15 mL via OROMUCOSAL
  Filled 2021-06-14: qty 15

## 2021-06-14 MED ORDER — BUPIVACAINE LIPOSOME 1.3 % IJ SUSP
INTRAMUSCULAR | Status: DC | PRN
  Administered 2021-06-14: 7 mL

## 2021-06-14 MED ORDER — ESMOLOL HCL 100 MG/10ML IV SOLN
INTRAVENOUS | Status: DC | PRN
  Administered 2021-06-14: 20 mg via INTRAVENOUS
  Administered 2021-06-14: 10 mg via INTRAVENOUS
  Administered 2021-06-14 (×2): 30 mg via INTRAVENOUS

## 2021-06-14 MED ORDER — LIDOCAINE HCL (PF) 2 % IJ SOLN
INTRAMUSCULAR | Status: AC
  Filled 2021-06-14: qty 5

## 2021-06-14 MED ORDER — ACETAMINOPHEN 10 MG/ML IV SOLN
INTRAVENOUS | Status: AC
  Filled 2021-06-14: qty 100

## 2021-06-14 MED ORDER — ROCURONIUM BROMIDE 10 MG/ML (PF) SYRINGE
PREFILLED_SYRINGE | INTRAVENOUS | Status: AC
  Filled 2021-06-14: qty 10

## 2021-06-14 MED ORDER — FENTANYL CITRATE (PF) 250 MCG/5ML IJ SOLN
INTRAMUSCULAR | Status: DC | PRN
  Administered 2021-06-14: 25 ug via INTRAVENOUS
  Administered 2021-06-14: 50 ug via INTRAVENOUS
  Administered 2021-06-14: 25 ug via INTRAVENOUS
  Administered 2021-06-14: 75 ug via INTRAVENOUS
  Administered 2021-06-14: 25 ug via INTRAVENOUS

## 2021-06-14 MED ORDER — PHENYLEPHRINE 40 MCG/ML (10ML) SYRINGE FOR IV PUSH (FOR BLOOD PRESSURE SUPPORT)
PREFILLED_SYRINGE | INTRAVENOUS | Status: DC | PRN
  Administered 2021-06-14 (×3): 80 ug via INTRAVENOUS

## 2021-06-14 MED ORDER — 0.9 % SODIUM CHLORIDE (POUR BTL) OPTIME
TOPICAL | Status: DC | PRN
  Administered 2021-06-14: 1000 mL

## 2021-06-14 MED ORDER — ESMOLOL HCL 100 MG/10ML IV SOLN
INTRAVENOUS | Status: AC
  Filled 2021-06-14: qty 10

## 2021-06-14 MED ORDER — AMISULPRIDE (ANTIEMETIC) 5 MG/2ML IV SOLN: 10.0000 mg | Freq: Once | INTRAVENOUS | Status: DC | PRN

## 2021-06-14 MED ORDER — SUGAMMADEX SODIUM 200 MG/2ML IV SOLN
INTRAVENOUS | Status: DC | PRN
  Administered 2021-06-14: 110 mg via INTRAVENOUS

## 2021-06-14 MED ORDER — SODIUM CHLORIDE (PF) 0.9 % IJ SOLN
INTRAMUSCULAR | Status: DC | PRN
  Administered 2021-06-14: 13 mL

## 2021-06-14 MED ORDER — LACTATED RINGERS IV SOLN: INTRAVENOUS | Status: DC

## 2021-06-14 MED ORDER — VASOPRESSIN 20 UNIT/ML IV SOLN
INTRAVENOUS | Status: AC
  Filled 2021-06-14: qty 1

## 2021-06-14 MED ORDER — VASOPRESSIN 20 UNIT/ML IV SOLN
INTRAVENOUS | Status: DC | PRN
  Administered 2021-06-14 (×2): 1 [IU] via INTRAVENOUS

## 2021-06-14 MED ORDER — CEFAZOLIN SODIUM-DEXTROSE 2-4 GM/100ML-% IV SOLN
2.0000 g | Freq: Three times a day (TID) | INTRAVENOUS | Status: AC
  Administered 2021-06-14 – 2021-06-15 (×2): 2 g via INTRAVENOUS
  Filled 2021-06-14 (×2): qty 100

## 2021-06-14 MED ORDER — ETOMIDATE 2 MG/ML IV SOLN
INTRAVENOUS | Status: AC
  Filled 2021-06-14: qty 10

## 2021-06-14 MED ORDER — PROPOFOL 10 MG/ML IV BOLUS
INTRAVENOUS | Status: DC | PRN
  Administered 2021-06-14: 30 mg via INTRAVENOUS
  Administered 2021-06-14: 40 mg via INTRAVENOUS
  Administered 2021-06-14: 10 mg via INTRAVENOUS

## 2021-06-14 MED ORDER — ACETAMINOPHEN 10 MG/ML IV SOLN
INTRAVENOUS | Status: DC | PRN
  Administered 2021-06-14: 1000 mg via INTRAVENOUS

## 2021-06-14 MED ORDER — TRANEXAMIC ACID-NACL 1000-0.7 MG/100ML-% IV SOLN
1000.0000 mg | INTRAVENOUS | Status: AC
  Administered 2021-06-14: 1000 mg via INTRAVENOUS
  Filled 2021-06-14: qty 100

## 2021-06-14 MED ORDER — FENTANYL CITRATE (PF) 250 MCG/5ML IJ SOLN
INTRAMUSCULAR | Status: AC
  Filled 2021-06-14: qty 5

## 2021-06-14 MED ORDER — PHENYLEPHRINE 40 MCG/ML (10ML) SYRINGE FOR IV PUSH (FOR BLOOD PRESSURE SUPPORT)
PREFILLED_SYRINGE | INTRAVENOUS | Status: AC
  Filled 2021-06-14: qty 10

## 2021-06-14 MED ORDER — LACTATED RINGERS IV SOLN: INTRAVENOUS | Status: DC | PRN

## 2021-06-14 MED ORDER — CEFAZOLIN SODIUM-DEXTROSE 2-4 GM/100ML-% IV SOLN
2.0000 g | INTRAVENOUS | Status: AC
  Administered 2021-06-14: 2 g via INTRAVENOUS
  Filled 2021-06-14: qty 100

## 2021-06-14 MED ORDER — LIDOCAINE 2% (20 MG/ML) 5 ML SYRINGE
INTRAMUSCULAR | Status: DC | PRN
  Administered 2021-06-14: 80 mg via INTRAVENOUS

## 2021-06-14 MED ORDER — PHENYLEPHRINE HCL-NACL 20-0.9 MG/250ML-% IV SOLN
INTRAVENOUS | Status: DC | PRN
  Administered 2021-06-14: 20 ug/min via INTRAVENOUS

## 2021-06-14 MED ORDER — ROCURONIUM BROMIDE 10 MG/ML (PF) SYRINGE
PREFILLED_SYRINGE | INTRAVENOUS | Status: DC | PRN
  Administered 2021-06-14: 50 mg via INTRAVENOUS

## 2021-06-14 MED ORDER — BUPIVACAINE LIPOSOME 1.3 % IJ SUSP
INTRAMUSCULAR | Status: AC
  Filled 2021-06-14: qty 20

## 2021-06-14 MED ORDER — DEXAMETHASONE SODIUM PHOSPHATE 10 MG/ML IJ SOLN
INTRAMUSCULAR | Status: AC
  Filled 2021-06-14: qty 1

## 2021-06-14 MED ORDER — FENTANYL CITRATE (PF) 100 MCG/2ML IJ SOLN: 25.0000 ug | INTRAMUSCULAR | Status: DC | PRN

## 2021-06-14 MED ORDER — POVIDONE-IODINE 10 % EX SWAB
2.0000 "application " | Freq: Once | CUTANEOUS | Status: AC
  Administered 2021-06-14: 2 via TOPICAL

## 2021-06-14 MED ORDER — ORAL CARE MOUTH RINSE: 15.0000 mL | Freq: Once | OROMUCOSAL | Status: AC

## 2021-06-14 SURGICAL SUPPLY — 52 items
BAG DECANTER FOR FLEXI CONT (MISCELLANEOUS) ×2 IMPLANT
BAG ZIPLOCK 12X15 (MISCELLANEOUS) ×2 IMPLANT
BLADE SAW SAG 25X90X1.19 (BLADE) ×1 IMPLANT
CHLORAPREP W/TINT 26 (MISCELLANEOUS) ×4 IMPLANT
COVER SURGICAL LIGHT HANDLE (MISCELLANEOUS) ×2 IMPLANT
DECANTER SPIKE VIAL GLASS SM (MISCELLANEOUS) ×6 IMPLANT
DERMABOND ADVANCED (GAUZE/BANDAGES/DRESSINGS) ×1
DERMABOND ADVANCED .7 DNX12 (GAUZE/BANDAGES/DRESSINGS) ×1 IMPLANT
DRAPE 3/4 80X56 (DRAPES) ×4 IMPLANT
DRAPE HIP W/POCKET STRL (MISCELLANEOUS) ×2 IMPLANT
DRAPE IMP U-DRAPE 54X76 (DRAPES) ×2 IMPLANT
DRAPE INCISE IOBAN 66X45 STRL (DRAPES) ×2 IMPLANT
DRAPE INCISE IOBAN 85X60 (DRAPES) ×3 IMPLANT
DRAPE POUCH INSTRU U-SHP 10X18 (DRAPES) ×2 IMPLANT
DRAPE U-SHAPE 47X51 STRL (DRAPES) ×2 IMPLANT
DRESSING AQUACEL AG SP 3.5X10 (GAUZE/BANDAGES/DRESSINGS) ×1 IMPLANT
DRSG AQUACEL AG ADV 3.5X10 (GAUZE/BANDAGES/DRESSINGS) ×1 IMPLANT
DRSG AQUACEL AG SP 3.5X10 (GAUZE/BANDAGES/DRESSINGS) ×2
ELECT BLADE TIP CTD 4 INCH (ELECTRODE) ×2 IMPLANT
ELECT REM PT RETURN 15FT ADLT (MISCELLANEOUS) ×2 IMPLANT
FEMORAL HEAD LFIT V40 28MM PL0 (Orthopedic Implant) ×1 IMPLANT
GLOVE SRG 8 PF TXTR STRL LF DI (GLOVE) ×1 IMPLANT
GLOVE SURG ENC TEXT LTX SZ8 (GLOVE) ×4 IMPLANT
GLOVE SURG UNDER POLY LF SZ8 (GLOVE) ×2
GOWN STRL REUS W/TWL XL LVL3 (GOWN DISPOSABLE) ×2 IMPLANT
HANDPIECE INTERPULSE COAX TIP (DISPOSABLE) ×2
HEAD COMP BIPOLAR 28X45 (Head) ×1 IMPLANT
HOOD PEEL AWAY FLYTE STAYCOOL (MISCELLANEOUS) ×6 IMPLANT
IMMOBILIZER KNEE 20 (SOFTGOODS) IMPLANT
IMMOBILIZER KNEE 20 THIGH 36 (SOFTGOODS) IMPLANT
IMMOBILIZER KNEE 22 UNIV (SOFTGOODS) ×1 IMPLANT
KIT BASIN OR (CUSTOM PROCEDURE TRAY) ×2 IMPLANT
KIT TURNOVER KIT A (KITS) ×2 IMPLANT
MANIFOLD NEPTUNE II (INSTRUMENTS) ×2 IMPLANT
MARKER SKIN DUAL TIP RULER LAB (MISCELLANEOUS) ×2 IMPLANT
NS IRRIG 1000ML POUR BTL (IV SOLUTION) ×2 IMPLANT
PACK TOTAL JOINT (CUSTOM PROCEDURE TRAY) ×2 IMPLANT
PROTECTOR NERVE ULNAR (MISCELLANEOUS) ×2 IMPLANT
SEALER BIPOLAR AQUA 6.0 (INSTRUMENTS) ×2 IMPLANT
SET HNDPC FAN SPRY TIP SCT (DISPOSABLE) ×1 IMPLANT
STAPLER VISISTAT 35W (STAPLE) IMPLANT
STEM NECK ANGLE HIP 3 132D (Stem) ×1 IMPLANT
SUCTION FRAZIER HANDLE 12FR (TUBING) ×2
SUCTION TUBE FRAZIER 12FR DISP (TUBING) ×1 IMPLANT
SUT ETHIBOND 2 V 37 (SUTURE) ×2 IMPLANT
SUT MNCRL AB 3-0 PS2 18 (SUTURE) ×2 IMPLANT
SUT VIC AB 1 CT1 27 (SUTURE) ×4
SUT VIC AB 1 CT1 27XBRD ANBCTR (SUTURE) IMPLANT
SUT VIC AB 2-0 CT2 27 (SUTURE) ×4 IMPLANT
TOWEL OR 17X26 10 PK STRL BLUE (TOWEL DISPOSABLE) ×2 IMPLANT
TRAY FOLEY MTR SLVR 16FR STAT (SET/KITS/TRAYS/PACK) ×2 IMPLANT
WATER STERILE IRR 1000ML POUR (IV SOLUTION) ×4 IMPLANT

## 2021-06-14 NOTE — Op Note (Signed)
06/14/2021  3:24 PM  PATIENT:  Christine Christensen   MRN: [id number]  PRE-OPERATIVE DIAGNOSIS:  Right Femoral Neck Fracture  POST-OPERATIVE DIAGNOSIS:  Right Femoral Neck Fracture  PROCEDURE:  Procedure(s): ARTHROPLASTY BIPOLAR HIP (HEMIARTHROPLASTY)  PREOPERATIVE INDICATIONS:  Christine Christensen is an 75 y.o. female who was admitted 06/12/2021 with a diagnosis of <principal problem not specified> and elected for surgical management.  The risks benefits and alternatives were discussed with the patient including but not limited to the risks of nonoperative treatment, versus surgical intervention including infection, bleeding, nerve injury, periprosthetic fracture, the need for revision surgery, dislocation, leg length discrepancy, blood clots, cardiopulmonary complications, morbidity, mortality, among others, and they were willing to proceed.  Predicted outcome is good, although there will be at least a six to nine month expected recovery.   OPERATIVE REPORT     SURGEON:  Charlies Constable, MD    ASSISTANT:  Merlene Pulling, PA  (Present throughout the entire procedure,  necessary for completion of procedure in a timely manner, assisting with retraction, instrumentation, and closure)     ANESTHESIA:  General  ESTIMATED BLOOD LOSS: 680HO    COMPLICATIONS:  None.   UNIQUE ASPECTS OF THE CASE: none     COMPONENTS:  Implant Name Type Inv. Item Serial No. Manufacturer Lot No. LRB No. Used Action  [place] HIP STEM   [id number] [employer] [id number] Right 1 Implanted  FEMORAL HEAD LFIT V40 28MM PL0 - [id number] Orthopedic Implant FEMORAL HEAD LFIT V40 28MM PL0  [employer] [id number] Right 1 Implanted  UHR UNIVERSAL HEAD: BIPOLAR COMPONENT   [id number] [employer] [id number] Right 1 Implanted      PROCEDURE IN DETAIL: The patient was met in the holding area and identified.  The appropriate hip  was marked at the operative site. The patient was then transported to the  OR and  placed under anesthesia.  At that point, the patient was  placed in the lateral decubitus position with the operative side up and  secured to the operating room table and all bony prominences padded.     The operative lower extremity was prepped from the iliac crest to the toes.  Sterile draping was performed.  Time out was performed prior to incision.      A routine posterolateral approach was utilized via sharp dissection  carried down to the subcutaneous tissue.  Gross bleeders were Bovie  coagulated.  The iliotibial band was identified and incised  along the length of the skin incision.  Self-retaining retractors were  inserted.  With the hip internally rotated, the short external rotators  were identified. The piriformis was tagged with #5 Ethibond, and the hip capsule released in a T-type fashion, and posterior sleeve of the capsule was also tagged.  The femoral neck was exposed, and I resected the femoral neck using the appropriate jig. This was performed at approximately a thumb's breadth above the lesser trochanter.    I then exposed the deep acetabulum, cleared out any tissue including the ligamentum teres.    I then prepared the proximal femur using the box cutter, Charnley awl, and then sequentially broached.  A trial utilized, and I reduced the hip, leg lengths were assessed clinically and felt to be equal. The hip was then taken through a full range of motion, the hip was stable at full extension and 90 degrees external rotation without anterior subluxation. The hip was also stable in the position of sleep, and in neutral abduction up  to 90 degrees flexion, and 90 degrees IR. The trial components were then removed.   The hip was then reduced and taken through functional range of motion and found to have excellent stability. Leg lengths were restored.  The capsule was then repaired with #5 Ethibond., and the piriformis was repaired to the abductor tendon.  His. Excellent  posterior capsular repair was achieved. An introap xrays obtained confirming no fracture of the femur.  I then irrigated the hip copiously again with pulse lavage. The wounds were injected with 20cc exparal diluted in sterile saline. and repaired the fascia with #1 Vicryl followed by 2-0 Vicryl and running 3-0 Monocryl for the skin, Dermabond was applied and an Aquacel dressing was placed.  The patient was then awakened and returned to PACU in stable and satisfactory condition. There were no complications.  Post op recs: WB: WBAT with posterior hip precautions x6 weeks Abx: ancef x23 hours post op Imaging: intraop xrays DVT prophylaxis: lovenox starting POD1 x4 weeks Follow up: 2 weeks after surgery for a wound check   Charlies Constable, MD Orthopedic Surgeon  06/14/2021 3:24 PM

## 2021-06-14 NOTE — Progress Notes (Signed)
     R displaced femoral neck fx plan for OR for hemiarthroplasty  Subjective: Following cardiac evaluation yesterday patient was felt to be at significant cardiac risk and was ultimately recommended to transfer to [facility] for closer cardiac monitoring and possible post op admission to the cardiac ICU.  She is on the schedule this afternoon for her hip hemiarthroplasty.  She is alert but disoriented.  She is able to answer questions but is not aware of her situation or her current hip fracture.  Objective:   VITALS:   Vitals:   06/14/21 0300 06/14/21 0347 06/14/21 0500 06/14/21 0600  BP: (!) 152/84  107/67   Pulse: 88 (!) 111 80   Resp: 17 (!) 22 17 (P) 16  Temp:      TempSrc:      SpO2: 94% 92% 95%   Weight:      Height:       Skin intact with no abrasions or lesions about the right hip. Logroll deferred given known hip fracture Nontender to palpation about the knee with no effusion well-healed anterior incision Holds foot in an everted position, unable to demonstrate active dorsiflexion, patient notes some discomfort with passive range of motion of the ankle, and some tenderness palpation    Lab Results  Component Value Date   WBC 9.0 06/14/2021   HGB 12.0 06/14/2021   HCT 35.7 (L) 06/14/2021   MCV 89.3 06/14/2021   PLT 159 06/14/2021   BMET    Component Value Date/Time   NA 135 06/14/2021 0052   K 3.5 06/14/2021 0052   CL 104 06/14/2021 0052   CO2 22 06/14/2021 0052   GLUCOSE 94 06/14/2021 0052   BUN 12 06/14/2021 0052   CREATININE 0.72 06/14/2021 0052   CREATININE 0.75 03/03/2021 1053   CALCIUM 8.9 06/14/2021 0052   GFRNONAA >60 06/14/2021 0052   GFRNONAA 78 03/03/2021 1053   X-rays right ankle negative for fracture dislocation  Assessment/Plan: 1 Day Post-Op   Active Problems:   Closed right hip fracture, initial encounter ([facility])   Hip fracture ([facility])   Protein-calorie malnutrition, severe Displaced right femoral neck fracture  Plan to proceed to  the OR for hemiarthroplasty today. Cardiac assessment was performed and she is high risk for proceeding to the OR but no other preoperative optimization is indicated at this time  Will obtain also x-rays of the right ankle to evaluate for fracture after her fall     [place] 06/14/2021, 7:31 AM   Charlies Constable, MD Cell [phone]

## 2021-06-14 NOTE — Anesthesia Preprocedure Evaluation (Signed)
Anesthesia Evaluation  Patient identified by MRN, date of birth, ID band Patient awake and Patient confused    Reviewed: Allergy & Precautions, NPO status , Patient's Chart, lab work & pertinent test results  Airway Mallampati: II  TM Distance: >3 FB Neck ROM: Full    Dental  (+) Dental Advisory Given   Pulmonary Current Smoker,    breath sounds clear to auscultation       Cardiovascular hypertension, Pt. on medications pulmonary hypertension+ Valvular Problems/Murmurs  Rhythm:Regular Rate:Normal  Severe MS, mild AS. Hyperdynamic LV with EF70%.   Neuro/Psych CVA    GI/Hepatic negative GI ROS, Neg liver ROS,   Endo/Other  negative endocrine ROS  Renal/GU negative Renal ROS     Musculoskeletal  (+) Arthritis ,   Abdominal   Peds  Hematology negative hematology ROS (+)   Anesthesia Other Findings   Reproductive/Obstetrics                             Anesthesia Physical Anesthesia Plan  ASA: 4  Anesthesia Plan: General   Post-op Pain Management:    Induction: Intravenous  PONV Risk Score and Plan: 2 and Dexamethasone, Ondansetron and Treatment may vary due to age or medical condition  Airway Management Planned: Oral ETT  Additional Equipment: Arterial line  Intra-op Plan:   Post-operative Plan: Extubation in OR and Possible Post-op intubation/ventilation  Informed Consent: I have reviewed the patients History and Physical, chart, labs and discussed the procedure including the risks, benefits and alternatives for the proposed anesthesia with the patient or authorized representative who has indicated his/her understanding and acceptance.     Dental advisory given and Consent reviewed with POA  Plan Discussed with:   Anesthesia Plan Comments:         Anesthesia Quick Evaluation

## 2021-06-14 NOTE — H&P (View-Only) (Signed)
     R displaced femoral neck fx plan for OR for hemiarthroplasty  Subjective: Following cardiac evaluation yesterday patient was felt to be at significant cardiac risk and was ultimately recommended to transfer to [facility] for closer cardiac monitoring and possible post op admission to the cardiac ICU.  She is on the schedule this afternoon for her hip hemiarthroplasty.  She is alert but disoriented.  She is able to answer questions but is not aware of her situation or her current hip fracture.  Objective:   VITALS:   Vitals:   06/14/21 0300 06/14/21 0347 06/14/21 0500 06/14/21 0600  BP: (!) 152/84  107/67   Pulse: 88 (!) 111 80   Resp: 17 (!) 22 17 (P) 16  Temp:      TempSrc:      SpO2: 94% 92% 95%   Weight:      Height:       Skin intact with no abrasions or lesions about the right hip. Logroll deferred given known hip fracture Nontender to palpation about the knee with no effusion well-healed anterior incision Holds foot in an everted position, unable to demonstrate active dorsiflexion, patient notes some discomfort with passive range of motion of the ankle, and some tenderness palpation    Lab Results  Component Value Date   WBC 9.0 06/14/2021   HGB 12.0 06/14/2021   HCT 35.7 (L) 06/14/2021   MCV 89.3 06/14/2021   PLT 159 06/14/2021   BMET    Component Value Date/Time   NA 135 06/14/2021 0052   K 3.5 06/14/2021 0052   CL 104 06/14/2021 0052   CO2 22 06/14/2021 0052   GLUCOSE 94 06/14/2021 0052   BUN 12 06/14/2021 0052   CREATININE 0.72 06/14/2021 0052   CREATININE 0.75 03/03/2021 1053   CALCIUM 8.9 06/14/2021 0052   GFRNONAA >60 06/14/2021 0052   GFRNONAA 78 03/03/2021 1053   X-rays right ankle negative for fracture dislocation  Assessment/Plan: 1 Day Post-Op   Active Problems:   Closed right hip fracture, initial encounter ([facility])   Hip fracture ([facility])   Protein-calorie malnutrition, severe Displaced right femoral neck fracture  Plan to proceed to  the OR for hemiarthroplasty today. Cardiac assessment was performed and she is high risk for proceeding to the OR but no other preoperative optimization is indicated at this time  Will obtain also x-rays of the right ankle to evaluate for fracture after her fall     [place] 06/14/2021, 7:31 AM   Charlies Constable, MD Cell [phone]

## 2021-06-14 NOTE — Anesthesia Procedure Notes (Signed)
Arterial Line Insertion Start/End9/27/2022 12:20 PM, 06/14/2021 12:30 PM Performed by: Reece Agar, CRNA, CRNA  Patient location: Pre-op. Preanesthetic checklist: patient identified, IV checked, site marked, risks and benefits discussed, surgical consent, monitors and equipment checked, pre-op evaluation, timeout performed and anesthesia consent Lidocaine 1% used for infiltration Left, radial was placed Catheter size: 20 G Hand hygiene performed , maximum sterile barriers used  and Seldinger technique used Allen's test indicative of satisfactory collateral circulation Attempts: 1 Procedure performed without using ultrasound guided technique. Following insertion, dressing applied and Biopatch. Post procedure assessment: normal and unchanged  Patient tolerated the procedure well with no immediate complications.

## 2021-06-14 NOTE — Progress Notes (Signed)
   06/14/21 0347  Assess: MEWS Score  Pulse Rate (!) 111  ECG Heart Rate (!) 111  Resp (!) 22  SpO2 92 %  Assess: MEWS Score  MEWS Temp 0  MEWS Systolic 0  MEWS Pulse 2  MEWS RR 1  MEWS LOC 0  MEWS Score 3  MEWS Score Color Yellow  Assess: if the MEWS score is Yellow or Red  Were vital signs taken at a resting state? Yes  Focused Assessment Change from prior assessment (see assessment flowsheet)  Early Detection of Sepsis Score *See Row Information* Low  MEWS guidelines implemented *See Row Information* No, other (Comment)  Treat  MEWS Interventions Administered prn meds/treatments  Pain Scale 0-10  Pain Score 8  Pain Type Acute pain  Pain Location Hip  Pain Orientation Right  Pain Descriptors / Indicators Aching;Discomfort;Moaning  Pain Onset Awakened from sleep  Patients Stated Pain Goal 0  Pain Intervention(s) Medication (See eMAR)  Multiple Pain Sites No  Assess: SIRS CRITERIA  SIRS Temperature  0  SIRS Pulse 1  SIRS Respirations  1  SIRS WBC 0  SIRS Score Sum  2

## 2021-06-14 NOTE — Transfer of Care (Signed)
Immediate Anesthesia Transfer of Care Note  Patient: Christine Christensen  Procedure(s) Performed: ARTHROPLASTY BIPOLAR HIP (HEMIARTHROPLASTY) (Right: Hip)  Patient Location: PACU  Anesthesia Type:General  Level of Consciousness: awake  Airway & Oxygen Therapy: Patient Spontanous Breathing and Patient connected to face mask oxygen  Post-op Assessment: Report given to RN and Post -op Vital signs reviewed and stable  Post vital signs: Reviewed and stable  Last Vitals:  Vitals Value Taken Time  BP 168/65 06/14/21 1534  Temp 37 C 06/14/21 1525  Pulse 89 06/14/21 1534  Resp 16 06/14/21 1534  SpO2 91 % 06/14/21 1534  Vitals shown include unvalidated device data.  Last Pain:  Vitals:   06/14/21 1525  TempSrc:   PainSc: Asleep      Patients Stated Pain Goal: 0 (26/37/85 8850)  Complications: No notable events documented.

## 2021-06-14 NOTE — Progress Notes (Signed)
[facility] Progress Note  Patient: Christine Christensen    BUY:[id number]  DOA: 06/12/2021     Date of Service: the patient was seen and examined on 06/14/2021  Brief hospital course: Past medical history of CVA with right-sided weakness, HTN, vascular dementia, Mnire's disease, moderate mitral stenosis.  Presents with complaints of a sudden fall in the kitchen and found to have right femoral neck fracture. Initially admitted to the vascular hospital and transferred to [facility] for anesthesia recommendation due to high risk. Currently plan is monitor postoperative recovery.  Subjective: Denies any acute complaint.  No nausea no vomiting.  No chest pain abdominal pain no shortness of breath.  Assessment and Plan: Displaced right femoral neck fracture: - Sustained after possible near syncope and mechanical fall at home. - Cardiology has consulted for preop evaluation, reviewed her repeat echo and indicate that she is a high - very high risk (nonmodifiable) for intermediate risk procedure, have started preop beta-blockers, recommend involvement of a cardiac anesthetist and no further work-up indicated. - Orthopedics input appreciated.   Severe calcified mitral valve stenosis with severe pulmonary hypertension and mild aortic valve stenosis - Cardiology evaluation and management as noted above.   Atrial tachycardia or SVT, paroxysmal - Likely related to severe mitral valve stenosis. - Cardiology has started metoprolol 12.5 Mg twice daily, to be continued perioperatively as well.   Near syncope versus syncope and falls - Secondary to severe mitral valve stenosis, advanced age and chronic ambulatory dysfunction from CVAs and right hemiparesis - Certainly will need SNF for rehab post op.   Prior CVAs x2 with residual right hemiparesis - Therapy evaluation postsurgery. - CTA head without acute findings. - Continue aspirin   Essential hypertension - Controlled.  Avoid  hypotension.  Severe protein calorie malnutrition. Placing the patient at high risk for poor outcome.  Continue nutritional supplement.  Body mass index is 20.7 kg/m.  Nutrition Problem: Severe Malnutrition Etiology: chronic illness (dementia and CVA)     DVT Prophylaxis:   enoxaparin (LOVENOX) injection 40 mg Start: 06/13/21 1000    Pt is Full code.  Communication: no family was present at bedside, at the time of interview.   Data Reviewed: I have personally reviewed and interpreted daily labs, tele strips, imaging. Hemoglobin electrolytes stable.  Physical Exam:  General: Appear in mild distress, no Rash; Oral Mucosa Clear, moist. no Abnormal Neck Mass Or lumps, Conjunctiva normal  Cardiovascular: S1 and S2 Present, systolic  Murmur, Respiratory: good respiratory effort, Bilateral Air entry present and CTA, no Crackles, no wheezes Abdomen: Bowel Sound present, Soft and no tenderness Extremities: no Pedal edema Neurology: alert and oriented to time, place, and person affect appropriate. no new focal deficit Gait not checked due to patient safety concerns   Vitals:   06/14/21 1800 06/14/21 1952 06/14/21 2000 06/14/21 2102  BP: 108/62 117/67 (!) 120/59 113/66  Pulse: 75 86 86 84  Resp: 11 17 14    Temp:  (!) 97.4 F (36.3 C)    TempSrc:  Oral    SpO2: 92% 97% 97%   Weight:      Height:        Disposition:  Status is: Inpatient  Remains inpatient appropriate because:Inpatient level of care appropriate due to severity of illness  Dispo: The patient is from: Home              Anticipated d/c is to: SNF  Patient currently is not medically stable to d/c.   Difficult to place patient No  Time spent: 35 minutes. I reviewed all nursing notes, pharmacy notes, vitals, pertinent old records. I have discussed plan of care as described above with RN.  Author: Berle Mull, MD [facility] 06/14/2021 11:25 PM  To reach On-call, see care teams to locate  the attending and reach out via [email or web address]. Between 7PM-7AM, please contact night-coverage If you still have difficulty reaching the attending provider, please page the [facility] (Director on Call) for [facility] on amion for assistance.

## 2021-06-14 NOTE — Anesthesia Procedure Notes (Signed)
Procedure Name: Intubation Date/Time: 06/14/2021 1:07 PM Performed by: Reece Agar, CRNA Pre-anesthesia Checklist: Patient identified, Emergency Drugs available, Suction available and Patient being monitored Patient Re-evaluated:Patient Re-evaluated prior to induction Oxygen Delivery Method: Circle System Utilized Preoxygenation: Pre-oxygenation with 100% oxygen Induction Type: IV induction Ventilation: Mask ventilation without difficulty Laryngoscope Size: Mac and 3 Grade View: Grade I Tube type: Oral Number of attempts: 1 Airway Equipment and Method: Stylet Placement Confirmation: ETT inserted through vocal cords under direct vision, positive ETCO2 and breath sounds checked- equal and bilateral Secured at: 21 cm Tube secured with: Tape Dental Injury: Teeth and Oropharynx as per pre-operative assessment

## 2021-06-14 NOTE — Interval H&P Note (Signed)
The risks benefits and alternatives were discussed with the patient and patient's son [name] including but not limited to the risks of nonoperative treatment, versus surgical intervention including infection, bleeding, nerve injury, periprosthetic fracture, the need for revision surgery, dislocation, leg length discrepancy, blood clots, cardiopulmonary complications, morbidity, mortality, among others, and they were willing to proceed.    They understand her cardiac risk.

## 2021-06-14 NOTE — Progress Notes (Signed)
     Subjective:  Christine Christensen Christine Christensen is returned to her room after surgery.  Son is at the bedside.  She is alert but not oriented.  She denies any pain.  Son has been concerned that as she has become more alert she seems to be guarding with her right arm she does not seem to be focally tender anywhere but seems to be in discomfort with right arm manipulation.  Objective:   VITALS:   Vitals:   06/14/21 1216 06/14/21 1525 06/14/21 1540 06/14/21 1555  BP: (!) 166/65 (!) 173/76 137/69 117/76  Pulse: 82 83 88 78  Resp:  16 15 15   Temp:  98.6 F (37 C)    TempSrc:      SpO2:  97% 99% 98%  Weight:      Height:        Intact pulses distally Incision: dressing C/D/I Christine Christensen not cooperative with motor or sensory exam Continues to hold the right ankle in an inverted position, can be passively corrected to neutral she holds the right arm at her side and elbow flexed.  Lab Results  Component Value Date   WBC 9.0 06/14/2021   HGB 12.0 06/14/2021   HCT 35.7 (L) 06/14/2021   MCV 89.3 06/14/2021   PLT 159 06/14/2021   BMET    Component Value Date/Time   NA 135 06/14/2021 0052   K 3.5 06/14/2021 0052   CL 104 06/14/2021 0052   CO2 22 06/14/2021 0052   GLUCOSE 94 06/14/2021 0052   BUN 12 06/14/2021 0052   CREATININE 0.72 06/14/2021 0052   CREATININE 0.75 03/03/2021 1053   CALCIUM 8.9 06/14/2021 0052   GFRNONAA >60 06/14/2021 0052   GFRNONAA 78 03/03/2021 1053     Assessment/Plan: Day of Surgery   Active Problems:   Closed right hip fracture, initial encounter ([place])   Hip fracture (HCC)   Protein-calorie malnutrition, severe   Right femoral neck fracture status post hemiarthroplasty on 9/27  X-rays ordered of the right humerus and forearm to rule out a fracture  Post op recs: WB: WBAT with posterior hip precautions x6 weeks Abx: ancef x23 hours post op Imaging: intraop xrays Dressing: Aquacel to be left intact until follow-up  DVT prophylaxis: lovenox starting POD1 x4  weeks Follow up: 2 weeks after surgery for a wound check  Trafton Roker A Mattison Golay 06/14/2021, 5:01 PM   Charlies Constable, MD Cell [phone]

## 2021-06-15 ENCOUNTER — Inpatient Hospital Stay (HOSPITAL_COMMUNITY): Payer: Medicare (Managed Care)

## 2021-06-15 DIAGNOSIS — S72001A Fracture of unspecified part of neck of right femur, initial encounter for closed fracture: Secondary | ICD-10-CM | POA: Diagnosis not present

## 2021-06-15 DIAGNOSIS — I342 Nonrheumatic mitral (valve) stenosis: Secondary | ICD-10-CM | POA: Diagnosis not present

## 2021-06-15 LAB — CBC
HCT: 30.6 % — ABNORMAL LOW (ref 36.0–46.0)
Hemoglobin: 10.1 g/dL — ABNORMAL LOW (ref 12.0–15.0)
MCH: 29.3 pg (ref 26.0–34.0)
MCHC: 33 g/dL (ref 30.0–36.0)
MCV: 88.7 fL (ref 80.0–100.0)
Platelets: 146 10*3/uL — ABNORMAL LOW (ref 150–400)
RBC: 3.45 MIL/uL — ABNORMAL LOW (ref 3.87–5.11)
RDW: 14.7 % (ref 11.5–15.5)
WBC: 11.6 10*3/uL — ABNORMAL HIGH (ref 4.0–10.5)
nRBC: 0 % (ref 0.0–0.2)

## 2021-06-15 MED ORDER — MIRTAZAPINE 7.5 MG PO TABS
7.5000 mg | ORAL_TABLET | Freq: Every day | ORAL | Status: DC
  Administered 2021-06-15 – 2021-06-17 (×3): 7.5 mg via ORAL
  Filled 2021-06-15 (×3): qty 1

## 2021-06-15 MED ORDER — ACETAMINOPHEN 500 MG PO TABS
1000.0000 mg | ORAL_TABLET | Freq: Four times a day (QID) | ORAL | Status: DC
  Administered 2021-06-15: 500 mg via ORAL
  Administered 2021-06-15 – 2021-06-19 (×15): 1000 mg via ORAL
  Filled 2021-06-15 (×17): qty 2

## 2021-06-15 MED ORDER — CHLORHEXIDINE GLUCONATE CLOTH 2 % EX PADS
6.0000 | MEDICATED_PAD | Freq: Every day | CUTANEOUS | Status: DC
  Administered 2021-06-16 – 2021-06-27 (×8): 6 via TOPICAL

## 2021-06-15 NOTE — Anesthesia Postprocedure Evaluation (Signed)
Anesthesia Post Note  Patient: Christine Christensen  Procedure(s) Performed: ARTHROPLASTY BIPOLAR HIP (HEMIARTHROPLASTY) (Right: Hip)     Patient location during evaluation: PACU Anesthesia Type: General Level of consciousness: awake and alert Pain management: pain level controlled Vital Signs Assessment: post-procedure vital signs reviewed and stable Respiratory status: spontaneous breathing, nonlabored ventilation, respiratory function stable and patient connected to nasal cannula oxygen Cardiovascular status: blood pressure returned to baseline and stable Postop Assessment: no apparent nausea or vomiting Anesthetic complications: no   No notable events documented.  Last Vitals:  Vitals:   06/15/21 0317 06/15/21 0727  BP: 90/65 (!) 95/51  Pulse: 75 63  Resp: 14 11  Temp: 36.8 C   SpO2: 97% 97%    Last Pain:  Vitals:   06/15/21 0407  TempSrc:   PainSc: Asleep                 Tiajuana Amass

## 2021-06-15 NOTE — Progress Notes (Signed)
OT Cancellation Note  Patient Details Name: GENINE BECKETT MRN: [id number] DOB: 1946/05/07   Cancelled Treatment:    Reason Eval/Treat Not Completed: Pain limiting ability to participate Per PT, pt unable to tolerate much movement of R LE due to pain. Per RN, unable to receive anything else for pain at this time. Will hold OT eval and follow-up as appropriate for premedication and pt pain tolerance.   Layla Maw 06/15/2021, 10:04 AM

## 2021-06-15 NOTE — TOC CAGE-AID Note (Signed)
Transition of Care [facility]) - CAGE-AID Screening   Patient Details  Name: ROBEN TATSCH MRN: [id number] Date of Birth: February 16, 1946  Transition of Care [facility]) CM/SW Contact:    Noe Goyer C Tarpley-Carter, [place] Phone Number: 06/15/2021, 11:00 AM   Clinical Narrative: Pt is unable to participate in Cage Aid.  Ammarie Matsuura Tarpley-Carter, MSW, LCSW-A Pronouns:  She/Her/Hers [facility] Clinical Social Worker Direct Number:  [phone] [email or web address]   CAGE-AID Screening: Substance Abuse Screening unable to be completed due to: : Patient unable to participate             Substance Abuse Education Offered: No

## 2021-06-15 NOTE — Progress Notes (Signed)
     Subjective:  Patient doing okay this morning. Unable to participate much with physical therapy, limited by pain. She denies any pain in the R arm today and her xrays yesterday were negative for fracture.    Objective:   VITALS:   Vitals:   06/14/21 2300 06/15/21 0317 06/15/21 0727 06/15/21 0800  BP: (!) 95/51 90/65 (!) 95/51 104/68  Pulse: 81 75 63 63  Resp: 14 14 11 10   Temp: 97.6 F (36.4 C) 98.2 F (36.8 C) 98.4 F (36.9 C)   TempSrc: Oral Oral Oral   SpO2: 96% 97% 97% 99%  Weight:      Height:        Intact pulses distally Incision: dressing C/D/I Patient not cooperative with motor or sensory exam Continues to hold the right ankle in an inverted position, can be passively corrected to neutral. she holds the right arm at her side and elbow flexed.  Lab Results  Component Value Date   WBC 9.0 06/14/2021   HGB 12.0 06/14/2021   HCT 35.7 (L) 06/14/2021   MCV 89.3 06/14/2021   PLT 159 06/14/2021   BMET    Component Value Date/Time   NA 135 06/14/2021 0052   K 3.5 06/14/2021 0052   CL 104 06/14/2021 0052   CO2 22 06/14/2021 0052   GLUCOSE 94 06/14/2021 0052   BUN 12 06/14/2021 0052   CREATININE 0.72 06/14/2021 0052   CREATININE 0.75 03/03/2021 1053   CALCIUM 8.9 06/14/2021 0052   GFRNONAA >60 06/14/2021 0052   GFRNONAA 78 03/03/2021 1053     Assessment/Plan: 1 Day Post-Op   Active Problems:   Closed right hip fracture, initial encounter ([place])   Hip fracture (HCC)   Protein-calorie malnutrition, severe   Right femoral neck fracture status post hemiarthroplasty on 9/27  X-rays Right humerus and forearm -negative for fx  Post op recs: WB: WBAT with posterior hip precautions x6 weeks, hip abduction pillow to be used when in bed. Abx: ancef x23 hours post op Imaging: intraop xrays Dressing: Aquacel to be left intact until follow-up  DVT prophylaxis: lovenox starting POD1 x4 weeks Follow up: 2 weeks after surgery for a wound check  Xyon Lukasik A  Niomie Englert 06/15/2021, 10:33 AM   Charlies Constable, MD Cell [phone]

## 2021-06-15 NOTE — Evaluation (Signed)
Occupational Therapy Evaluation Patient Details Name: Christine Christensen MRN: [id number] DOB: 12-30-1945 Today's Date: 06/15/2021   History of Present Illness Pt is a 75 y.o. female admitted 06/12/21 with fall at home; found to have R femoral neck fx. Sx delayed as pt deemed high risk; pre-op cardiology evaluation for near syncope, mitral stenosis. S/p R hip hemiarthroplasty on 9/27. Pt guarding R arm post-op; RUE imaging negative for acute injury. PMH includes vascular dementia, HTN, CVAs with residual R-side weakness (2005, 2008), Meniere's disease, bilateral TKAs.   Clinical Impression   PTA, pt lives with son and reports Modified [place] with ADLs and mobility using Rollator. Per RN, pt with less confusion this PM though still pain limited (& unable to be receive additional meds). Pt able to answer all orientation questions appropriately and agreeable to attempt EOB with encouragement. Pt overall Total A for bed mobility to get to/from EOB - increased difficulty with returning to supine as pt wanted to lean to opposite direction despite cues. Pt requires Min A for UB ADLs and up to Total A x 2 for LB ADLs bed level at this time. Abduction wedge placed between B LE to assist in maintaining hip precautions with RN planning to order boots to assist with proper joint alignment in bed. Recommend SNF rehab at this time as pt significantly below functional baseline and at risk for future falls.       Recommendations for follow up therapy are one component of a multi-disciplinary discharge planning process, led by the attending physician.  Recommendations may be updated based on patient status, additional functional criteria and insurance authorization.   Follow Up Recommendations  SNF;Supervision/Assistance - 24 hour    Equipment Recommendations  3 in 1 bedside commode;Other (comment) (to be determined)    Recommendations for Other Services       Precautions / Restrictions  Precautions Precautions: Fall;Posterior Hip Required Braces or Orthoses: Other Brace Other Brace: abduction wedge Restrictions Weight Bearing Restrictions: Yes RLE Weight Bearing: Weight bearing as tolerated      Mobility Bed Mobility Overal bed mobility: Needs Assistance Bed Mobility: Supine to Sit;Sit to Supine     Supine to sit: Total assist;HOB elevated Sit to supine: Total assist   General bed mobility comments: Total A to sit EOB with pt able to assist moving L LE to EOB with cues. Assist needed to move R LE slowly and handheld assist to lift trunk and maintain balance, cued to use bed rails  but pt internally distracted by pain. Total A to get back in bed with pt attempting to lean to opposite direction likely due to pain but increased fall risk    Transfers                 General transfer comment: pt unable to tolerate secondary to pain; will require +2 assist    Balance Overall balance assessment: Needs assistance;History of Falls Sitting-balance support: Bilateral upper extremity supported;Feet supported Sitting balance-Leahy Scale: Poor Sitting balance - Comments: Posterior lean, leaning heavily to R elbow EOB. When cued to lean forward for upright posture, pt resistive and leaning back                                   ADL either performed or assessed with clinical judgement   ADL Overall ADL's : Needs assistance/impaired Eating/Feeding: Set up;Sitting Eating/Feeding Details (indicate cue type and reason): to eat ice cream  in bed at end of session     Upper Body Bathing: Minimal assistance;Sitting   Lower Body Bathing: Total assistance;+2 for safety/equipment;+2 for physical assistance;Bed level       Lower Body Dressing: Total assistance;+2 for physical assistance;+2 for safety/equipment;Bed level       Toileting- Clothing Manipulation and Hygiene: Total assistance;Sitting/lateral lean;Sit to/from stand         General ADL  Comments: Session focused on initial education of posterior hip precautions, getting to EOB with increased time needed due to severely pain limited     Vision Baseline Vision/History: 1 Wears glasses Ability to See in Adequate Light: 0 Adequate Patient Visual Report: No change from baseline Vision Assessment?: No apparent visual deficits     Perception     Praxis      Pertinent Vitals/Pain Pain Assessment: Faces Faces Pain Scale: Hurts worst Pain Location: RLE with any movement Pain Descriptors / Indicators: Spasm;Moaning;Guarding Pain Intervention(s): Monitored during session;Premedicated before session;Limited activity within patient's tolerance;Repositioned     Hand Dominance Right   Extremity/Trunk Assessment Upper Extremity Assessment Upper Extremity Assessment: Generalized weakness;RUE deficits/detail;LUE deficits/detail RUE Deficits / Details: H/o CVA with residual R-side weakness; suspect RA in bilateral wrist/fingers; noted MCP flexion contractures on 2-5th digit RUE Coordination: decreased fine motor LUE Deficits / Details: suspect RA in bilateral wrist/fingers LUE Coordination: decreased fine motor   Lower Extremity Assessment Lower Extremity Assessment: Defer to PT evaluation RLE Deficits / Details: s/p R hip hemiarthroplasty; little to no active movement noted in RLE, very poor tolerance to PROM; received with R hip resting in significant ADD/IR, therefore hip abduction pillow ordered and placed RLE: Unable to fully assess due to pain   Cervical / Trunk Assessment Cervical / Trunk Assessment: Kyphotic   Communication Communication Communication: HOH   Cognition Arousal/Alertness: Awake/alert Behavior During Therapy: WFL for tasks assessed/performed Overall Cognitive Status: History of cognitive impairments - at baseline Area of Impairment: Memory;Following commands;Safety/judgement;Awareness;Problem solving                     Memory: Decreased  short-term memory Following Commands: Follows one step commands with increased time;Follows one step commands inconsistently Safety/Judgement: Decreased awareness of safety;Decreased awareness of deficits Awareness: Intellectual Problem Solving: Decreased initiation;Requires verbal cues;Slow processing General Comments: Pt with h/o vascular dementia. Able to state name, location, date and fact she had a fall and broke her hip. Pt confused at times, inconsistently answering questions and following simple commands correctly. Suspect short-term memory deficits   General Comments  Pt with tendency to have R ankle inverted - RN reports plan to order prevalon boots. SpO2 93% on RA with activity    Exercises Other Exercises Other Exercises: Took ~10-min for RLE PROM/repositioning to hip abduction and internal rotation, pt limited by significant pain and muscle spasm with any RLE movement   Shoulder Instructions      Home Living Family/patient expects to be discharged to:: Private residence Living Arrangements: Children Available Help at Discharge: Family;Available PRN/intermittently Type of Home: House Home Access: Ramped entrance     Home Layout: One level     Bathroom Shower/Tub: Teacher, early years/pre: Standard     Home Equipment: Environmental consultant - 4 wheels;Shower seat   Additional Comments: Pt poor historian, home set-up info taken from prior admission (2018). Confirmed that son does live with pt; unsure how much assist son able to provide      Prior Functioning/Environment Level of Independence: Needs assistance  Gait /  Transfers Assistance Needed: Pt reports ambulatory with Rollator ADL's / Homemaking Assistance Needed: Reports able to complete ADLs though difficulty with tub transfers. reports having a shower chair that she uses   Comments: H/o vascular dementia        OT Problem List: Decreased strength;Decreased activity tolerance;Impaired balance (sitting and/or  standing);Decreased cognition;Decreased knowledge of use of DME or AE;Decreased safety awareness;Decreased knowledge of precautions;Pain      OT Treatment/Interventions: Therapeutic exercise;Self-care/ADL training;Energy conservation;DME and/or AE instruction;Therapeutic activities;Balance training;Patient/family education    OT Goals(Current goals can be found in the care plan section) Acute Rehab OT Goals Patient Stated Goal: Decreased pain OT Goal Formulation: With patient Time For Goal Achievement: 06/29/21 Potential to Achieve Goals: Good  OT Frequency: Min 2X/week   Barriers to D/C:            Co-evaluation              AM-PAC OT "6 Clicks" Daily Activity     Outcome Measure Help from another person eating meals?: A Little Help from another person taking care of personal grooming?: A Little Help from another person toileting, which includes using toliet, bedpan, or urinal?: Total Help from another person bathing (including washing, rinsing, drying)?: A Lot Help from another person to put on and taking off regular upper body clothing?: A Little Help from another person to put on and taking off regular lower body clothing?: Total 6 Click Score: 13   End of Session Nurse Communication: Mobility status;Other (comment) (IV)  Activity Tolerance: Patient limited by pain Patient left: in bed;with bed alarm set;with call bell/phone within reach  OT Visit Diagnosis: Unsteadiness on feet (R26.81);Other abnormalities of gait and mobility (R26.89);Muscle weakness (generalized) (M62.81);History of falling (Z91.81);Pain Pain - Right/Left: Right Pain - part of body: Hip;Leg                Time: 1338-1410 OT Time Calculation (min): 32 min Charges:  OT General Charges $OT Visit: 1 Visit OT Evaluation $OT Eval Moderate Complexity: 1 Mod OT Treatments $Therapeutic Activity: 8-22 mins  Malachy Chamber, OTR/L Acute Rehab Services Office: [phone]   Layla Maw 06/15/2021, 2:28  PM

## 2021-06-15 NOTE — Progress Notes (Signed)
PROGRESS NOTE    Christine Christensen  BMW:[id number] DOB: 27-Nov-1945 DOA: 06/12/2021 PCP: Nolene Ebbs, MD    Brief Narrative:  Past medical history of CVA with right-sided weakness, HTN, vascular dementia, Mnire's disease, moderate mitral stenosis.  Presents with complaints of a sudden fall in the kitchen and found to have right femoral neck fracture. Initially admitted to [facility] and transferred to [facility] for anesthesia recommendation due to high risk. Right hip hemiarthroplasty 9/27.   Assessment & Plan:   Active Problems:   Closed right hip fracture, initial encounter [facility])   Hip fracture (HCC)   Protein-calorie malnutrition, severe  Close traumatic right hip fracture: Right hip hemiarthroplasty 9/27 Weightbearing as tolerated Start PT OT today Use adequate pain medications, oxycodone.  To minimize use of narcotics, will start patient on a scheduled doses of Tylenol 1 g 4 times a day for next few days. Lovenox for DVT prophylaxis.  Probably can go home on aspirin.  Severe calcific mitral valve stenosis with severe pulmonary hypertension and mild aortic valve stenosis: Seen by cardiology.  Recommended conservative management.  Started on beta-blockers with metoprolol 12.5 mg twice daily. Fall precautions.  Delirium precautions. 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  History of stroke, right hemiparesis: On aspirin.  CTA of the head without acute findings.  Essential hypertension: Well-controlled.  Acute metabolic encephalopathy in the setting of vascular dementia: Anticipated delirium from surgery, medication use and anesthesia along with ICU stay. Will avoid benzodiazepines.  Minimize narcotics with use of Tylenol. Start mobilizing. Allow visitors to stay off hours.  Medically stabilizing.  Anticipate patient will need a skilled nursing facility.    DVT prophylaxis:  enoxaparin (LOVENOX) injection 40 mg Start: 06/13/21 1000   Code Status: Full code Family Communication: Son at the bedside Disposition Plan: Status is: Inpatient  Remains inpatient appropriate because:Inpatient level of care appropriate due to severity of illness  Dispo: The patient is from: Home              Anticipated d/c is to: SNF              Patient currently is not medically stable to d/c.   Difficult to place patient No         Consultants:  Orthopedics Cardiology  Procedures:  Right hip hemiarthroplasty 9/27  Antimicrobials:  Perioperative   Subjective: Patient seen and examined.  Her son was at the bedside.  Patient did not complain much. She was wincing in pain on mobility of the right leg as well as right ankle. Family reported that she is more confused than usual and we discussed that this is probably anticipated from anesthesia and hospitalization.  Denies any nausea vomiting.  Ate regular diet.  Objective: Vitals:   06/14/21 2300 06/15/21 0317 06/15/21 0727 06/15/21 0800  BP: (!) 95/51 90/65 (!) 95/51 104/68  Pulse: 81 75 63 63  Resp: 14 14 11 10   Temp: 97.6 F (36.4 C) 98.2 F (36.8 C) 98.4 F (36.9 C)   TempSrc: Oral Oral Oral   SpO2: 96% 97% 97% 99%  Weight:      Height:        Intake/Output Summary (Last 24 hours) at 06/15/2021 0936 Last data filed at 06/15/2021 0728 Gross per 24 hour  Intake 1540 ml  Output 1025 ml  Net 515 ml   Filed Weights   06/12/21 1709 06/13/21 1912  Weight: 53.1 kg 53  kg    Examination:  General exam: Appears calm and comfortable  Frail and debilitated.  Chronically sick looking.  Not in any distress. Respiratory system: Clear to auscultation. Respiratory effort normal.  On room air.  No added sounds. Cardiovascular system: S1 & S2 heard, RRR.  Gastrointestinal system: Soft and nontender.  Bowel sounds present.. Central nervous system: Alert and awake but not oriented.  Without any hallucinations or  delusions.  Remains quiet and calm with occasional impulsiveness. Extremities: Right lateral thigh incision clean and dry.  Dressing intact. Distal neurovascular status intact.  Tenderness on movement. Right knee on extension immobilizer.    Data Reviewed: I have personally reviewed following labs and imaging studies  CBC: Recent Labs  Lab 06/12/21 1530 06/14/21 0052  WBC 8.2 9.0  NEUTROABS 7.1  --   HGB 14.2 12.0  HCT 42.3 35.7*  MCV 88.9 89.3  PLT 219 831   Basic Metabolic Panel: Recent Labs  Lab 06/12/21 1530 06/14/21 0052  NA 139 135  K 3.7 3.5  CL 104 104  CO2 26 22  GLUCOSE 101* 94  BUN 13 12  CREATININE 0.87 0.72  CALCIUM 9.7 8.9   GFR: Estimated Creatinine Clearance: 50.3 mL/min (by C-G formula based on SCr of 0.72 mg/dL). Liver Function Tests: Recent Labs  Lab 06/12/21 1530  AST 19  ALT 13  ALKPHOS 60  BILITOT 0.7  PROT 7.6  ALBUMIN 3.9   No results for input(s): LIPASE, AMYLASE in the last 168 hours. No results for input(s): AMMONIA in the last 168 hours. Coagulation Profile: No results for input(s): INR, PROTIME in the last 168 hours. Cardiac Enzymes: No results for input(s): CKTOTAL, CKMB, CKMBINDEX, TROPONINI in the last 168 hours. BNP (last 3 results) No results for input(s): PROBNP in the last 8760 hours. HbA1C: No results for input(s): HGBA1C in the last 72 hours. CBG: No results for input(s): GLUCAP in the last 168 hours. Lipid Profile: No results for input(s): CHOL, HDL, LDLCALC, TRIG, CHOLHDL, LDLDIRECT in the last 72 hours. Thyroid Function Tests: No results for input(s): TSH, T4TOTAL, FREET4, T3FREE, THYROIDAB in the last 72 hours. Anemia Panel: No results for input(s): VITAMINB12, FOLATE, FERRITIN, TIBC, IRON, RETICCTPCT in the last 72 hours. Sepsis Labs: No results for input(s): PROCALCITON, LATICACIDVEN in the last 168 hours.  Recent Results (from the past 240 hour(s))  Resp Panel by RT-PCR (Flu A&B, Covid) Nasopharyngeal  Swab     Status: None   Collection Time: 06/12/21  3:30 PM   Specimen: Nasopharyngeal Swab; Nasopharyngeal(NP) swabs in vial transport medium  Result Value Ref Range Status   SARS Coronavirus 2 by RT PCR NEGATIVE NEGATIVE Final    Comment: (NOTE) SARS-CoV-2 target nucleic acids are NOT DETECTED.  The SARS-CoV-2 RNA is generally detectable in upper respiratory specimens during the acute phase of infection. The lowest concentration of SARS-CoV-2 viral copies this assay can detect is 138 copies/mL. A negative result does not preclude SARS-Cov-2 infection and should not be used as the sole basis for treatment or other patient management decisions. A negative result may occur with  improper specimen collection/handling, submission of specimen other than nasopharyngeal swab, presence of viral mutation(s) within the areas targeted by this assay, and inadequate number of viral copies(<138 copies/mL). A negative result must be combined with clinical observations, patient history, and epidemiological information. The expected result is Negative.  Fact Sheet for Patients:  EntrepreneurPulse.com.au  Fact Sheet for Healthcare Providers:  IncredibleEmployment.be  This test is no t yet  approved or cleared by the [place] and  has been authorized for detection and/or diagnosis of SARS-CoV-2 by FDA under an Emergency Use Authorization (EUA). This EUA will remain  in effect (meaning this test can be used) for the duration of the COVID-19 declaration under Section 564(b)(1) of the Act, 21 U.S.C.section 360bbb-3(b)(1), unless the authorization is terminated  or revoked sooner.       Influenza A by PCR NEGATIVE NEGATIVE Final   Influenza B by PCR NEGATIVE NEGATIVE Final    Comment: (NOTE) The Xpert Xpress SARS-CoV-2/FLU/RSV plus assay is intended as an aid in the diagnosis of influenza from Nasopharyngeal swab specimens and should not be used as a sole  basis for treatment. Nasal washings and aspirates are unacceptable for Xpert Xpress SARS-CoV-2/FLU/RSV testing.  Fact Sheet for Patients: EntrepreneurPulse.com.au  Fact Sheet for Healthcare Providers: IncredibleEmployment.be  This test is not yet approved or cleared by the [place] FDA and has been authorized for detection and/or diagnosis of SARS-CoV-2 by FDA under an Emergency Use Authorization (EUA). This EUA will remain in effect (meaning this test can be used) for the duration of the COVID-19 declaration under Section 564(b)(1) of the Act, 21 U.S.C. section 360bbb-3(b)(1), unless the authorization is terminated or revoked.  Performed at [facility], [place] [address] [place], [place] [address]   Surgical pcr screen     Status: None   Collection Time: 06/13/21 10:14 AM   Specimen: Nasal Mucosa; Nasal Swab  Result Value Ref Range Status   MRSA, PCR NEGATIVE NEGATIVE Final   Staphylococcus aureus NEGATIVE NEGATIVE Final    Comment: (NOTE) The Xpert SA Assay (FDA approved for NASAL specimens in patients 51 years of age and older), is one component of a comprehensive surveillance program. It is not intended to diagnose infection nor to guide or monitor treatment. Performed at [facility], [place] [address], [place], [place] [address]          Radiology Studies: DG ELBOW COMPLETE RIGHT (3+VIEW)  Result Date: 06/15/2021 CLINICAL DATA:  Right elbow fracture EXAM: RIGHT ELBOW - COMPLETE 3+ VIEW COMPARISON:  None. FINDINGS: No displaced coronoid process fracture of the proximal right radius. No joint effusion or fat pad displacement. Alignment is normal. IMPRESSION: No displaced coronoid process fracture visible. Electronically Signed   By: Ulyses Jarred M.D.   On: 06/15/2021 02:33   DG Forearm Right  Result Date: 06/14/2021 CLINICAL DATA:  Fall EXAM: RIGHT FOREARM - 2 VIEW COMPARISON:  None. FINDINGS: Possible  fracture deformity at the coronoid process of the ulna at the elbow. No malalignment. IMPRESSION: Findings are indeterminate for coronoid process fracture at the elbow, recommend dedicated views of the right elbow. These results will be called to the ordering clinician or representative by the Radiologist Assistant, and communication documented in the PACS or [employer]. Electronically Signed   By: Donavan Foil M.D.   On: 06/14/2021 20:09   DG Pelvis Portable  Result Date: 06/14/2021 CLINICAL DATA:  Right hip hemiarthroplasty EXAM: PORTABLE PELVIS 1-2 VIEWS COMPARISON:  06/12/2021 FINDINGS: Single frontal view of the pelvis was obtained, limited by portable technique. Right hip hemiarthroplasty is identified in the expected position without signs of acute complication. Remainder of the bony pelvis is unremarkable. IMPRESSION: 1. Unremarkable right hip hemiarthroplasty. Electronically Signed   By: Randa Ngo M.D.   On: 06/14/2021 16:50   DG Ankle Right Port  Result Date: 06/13/2021 CLINICAL DATA:  Pain EXAM: PORTABLE RIGHT ANKLE -  2 VIEW COMPARISON:  None. FINDINGS: There is no evidence of fracture, dislocation, or joint effusion. There is no evidence of arthropathy or other focal bone abnormality. Soft tissue edema about the medial foot. IMPRESSION: No fracture or dislocation of the right ankle. Soft tissue edema about the medial foot. Electronically Signed   By: Eddie Candle M.D.   On: 06/13/2021 13:21   DG Humerus Right  Result Date: 06/14/2021 CLINICAL DATA:  Fall with elbow pain EXAM: RIGHT HUMERUS - 2+ VIEW COMPARISON:  None. FINDINGS: There is no evidence of fracture or other focal bone lesions. Soft tissues are unremarkable. IMPRESSION: Negative. Electronically Signed   By: Donavan Foil M.D.   On: 06/14/2021 20:09   ECHOCARDIOGRAM COMPLETE  Result Date: 06/13/2021    ECHOCARDIOGRAM REPORT   Patient Name:   LEIGHANNA KIRN [facility] Date of Exam: 06/13/2021 Medical Rec #:  [id number]         Height:       63.5 in Accession #:    [id number]       Weight:       117.0 lb Date of Birth:  [birth year]         BSA:          1.549 m Patient Age:    52 years         BP:           142/76 mmHg Patient Gender: F                HR:           71 bpm. Exam Location:  Inpatient Procedure: 2D Echo, 3D Echo, Cardiac Doppler and Color Doppler                                 MODIFIED REPORT:   This report was modified by Lyman Bishop MD on 06/13/2021 due to Noted LVEDP      elevated - however, difficult to quantify due to heavy mitral annular              calcification - suspect LVEDP value was overestimated.  Indications:     R55 Syncope  History:         Patient has prior history of Echocardiogram examinations, most                  recent 07/27/2017. Stroke, Aortic Valve Disease and Mitral Valve                  Disease, Signs/Symptoms:Murmur and Syncope; Risk                  Factors:Hypertension. Mitral and aortic stenosis.  Sonographer:     Roseanna Rainbow RDCS Referring Phys:  [id number] Lequita Halt Diagnosing Phys: Lyman Bishop MD IMPRESSIONS  1. Left ventricular ejection fraction, by estimation, is >75%. Left ventricular ejection fraction by 2D MOD biplane is 75.8 %. The left ventricle has hyperdynamic function. The left ventricle has no regional wall motion abnormalities. There is mild left  ventricular hypertrophy. Left ventricular diastolic parameters are consistent with Grade II diastolic dysfunction (pseudonormalization). Elevated left ventricular end-diastolic pressure.  2. Right ventricular systolic function is hyperdynamic. The right ventricular size is normal. There is severely elevated pulmonary artery systolic pressure. The estimated right ventricular systolic pressure is 16.0 mmHg.  3. The mitral valve is abnormal. Trivial mitral valve regurgitation. Severe mitral stenosis. The mean mitral  valve gradient is 18.0 mmHg with average heart rate of 79 bpm. Severe mitral annular calcification.  4. There is heavy  calcification of the aortic root uncluding a calcificed mass below the aortic valve annulus in the aorta/mitral curtain. The aortic valve is tricuspid. There is mild calcification of the aortic valve. Aortic valve regurgitation is not visualized. Mild aortic valve stenosis. Aortic valve mean gradient measures 10.4 mmHg. AVA around 1.56 cm2, DI 0.50  5. The inferior vena cava is normal in size with <50% respiratory variability, suggesting right atrial pressure of 8 mmHg. Comparison(s): Changes from prior study are noted. 07/27/2017: LVEF 60-65%, mild LVH, grade 1 DD with elevated LVEDP, Moderate MS - mean gradient 10 mmHg, mild AS - mean gradient 10 mmHg. Conclusion(s)/Recommendation(s): There is severe mitral stenosis with severe pulmonary hypertension and high LV filling pressure - the aortic valve appears unchanged, however, there is heavy aortic root calcification - consider CT surgery consultation. FINDINGS  Left Ventricle: Left ventricular ejection fraction, by estimation, is >75%. Left ventricular ejection fraction by 2D MOD biplane is 75.8 %. The left ventricle has hyperdynamic function. The left ventricle has no regional wall motion abnormalities. 3D left ventricular ejection fraction analysis performed but not reported based on interpreter judgement due to suboptimal quality. The left ventricular internal cavity size was normal in size. There is mild left ventricular hypertrophy. Left ventricular diastolic parameters are consistent with Grade II diastolic dysfunction (pseudonormalization). Elevated left ventricular end-diastolic pressure. Right Ventricle: The right ventricular size is normal. No increase in right ventricular wall thickness. Right ventricular systolic function is hyperdynamic. There is severely elevated pulmonary artery systolic pressure. The tricuspid regurgitant velocity  is 3.96 m/s, and with an assumed right atrial pressure of 8 mmHg, the estimated right ventricular systolic pressure is  41.6 mmHg. Left Atrium: Left atrial size was normal in size. Right Atrium: Right atrial size was normal in size. Pericardium: There is no evidence of pericardial effusion. Mitral Valve: The mitral valve is abnormal. Severe mitral annular calcification. Trivial mitral valve regurgitation. Severe mitral valve stenosis. MV peak gradient, 31.9 mmHg. The mean mitral valve gradient is 18.0 mmHg with average heart rate of 79 bpm. Tricuspid Valve: The tricuspid valve is grossly normal. Tricuspid valve regurgitation is mild. Aortic Valve: There is heavy calcification of the aortic root uncluding a calcificed mass below the aortic valve annulus in the aorta/mitral curtain. The aortic valve is tricuspid. There is mild calcification of the aortic valve. There is moderate to severe aortic valve annular calcification. Aortic valve regurgitation is not visualized. Mild aortic stenosis is present. Aortic valve mean gradient measures 10.4 mmHg. Aortic valve peak gradient measures 20.2 mmHg. Aortic valve area, by VTI measures 1.28 cm. Pulmonic Valve: The pulmonic valve was grossly normal. Pulmonic valve regurgitation is trivial. Aorta: The aortic root and ascending aorta are structurally normal, with no evidence of dilitation. Venous: The inferior vena cava is normal in size with less than 50% respiratory variability, suggesting right atrial pressure of 8 mmHg. IAS/Shunts: There is right bowing of the interatrial septum, suggestive of elevated left atrial pressure. No atrial level shunt detected by color flow Doppler.  LEFT VENTRICLE PLAX 2D                        Biplane EF (MOD) LVIDd:         3.50 cm         LV Biplane EF:   Left LVIDs:  2.30 cm                          ventricular LV PW:         0.90 cm                          ejection LV IVS:        1.30 cm                          fraction by LVOT diam:     1.80 cm                          2D MOD LV SV:         56                               biplane is LV SV Index:    36                               75.8 %. LVOT Area:     2.54 cm                                Diastology                                LV e' medial:    3.48 cm/s LV Volumes (MOD)               LV E/e' medial:  72.7 LV vol d, MOD    24.5 ml       LV e' lateral:   4.65 cm/s A2C:                           LV E/e' lateral: 54.4 LV vol d, MOD    45.9 ml A4C: LV vol s, MOD    4.9 ml A2C: LV vol s, MOD    12.5 ml       3D Volume EF: A4C:                           3D EF:        66 % LV SV MOD A2C:   19.6 ml       LV EDV:       73 ml LV SV MOD A4C:   45.9 ml       LV ESV:       25 ml LV SV MOD BP:    25.6 ml       LV SV:        48 ml RIGHT VENTRICLE             IVC RV S prime:     13.70 cm/s  IVC diam: 1.40 cm TAPSE (M-mode): 2.1 cm LEFT ATRIUM             Index       RIGHT ATRIUM           Index LA diam:  4.40 cm 2.84 cm/m  RA Area:     11.70 cm LA Vol (A2C):   40.5 ml 26.15 ml/m RA Volume:   24.80 ml  16.01 ml/m LA Vol (A4C):   48.1 ml 31.06 ml/m LA Biplane Vol: 45.0 ml 29.06 ml/m  AORTIC VALVE AV Area (Vmax):    1.56 cm AV Area (Vmean):   1.51 cm AV Area (VTI):     1.28 cm AV Vmax:           224.80 cm/s AV Vmean:          144.000 cm/s AV VTI:            0.434 m AV Peak Grad:      20.2 mmHg AV Mean Grad:      10.4 mmHg LVOT Vmax:         138.00 cm/s LVOT Vmean:        85.400 cm/s LVOT VTI:          0.219 m LVOT/AV VTI ratio: 0.50  AORTA Ao Root diam: 2.70 cm Ao Asc diam:  2.80 cm MITRAL VALVE                TRICUSPID VALVE MV Area (PHT): 2.73 cm     TR Peak grad:   62.7 mmHg MV Area VTI:   0.57 cm     TR Vmax:        396.00 cm/s MV Peak grad:  31.9 mmHg MV Mean grad:  18.0 mmHg    SHUNTS MV Vmax:       2.82 m/s     Systemic VTI:  0.22 m MV Vmean:      197.0 cm/s   Systemic Diam: 1.80 cm MV Decel Time: 278 msec MV E velocity: 253.00 cm/s MV A velocity: 238.00 cm/s MV E/A ratio:  1.06 Lyman Bishop MD Electronically signed by Lyman Bishop MD Signature Date/Time: 06/13/2021/10:35:48 AM    Final (Updated)          Scheduled Meds:  acetaminophen  1,000 mg Oral Q6H   aspirin  325 mg Oral Daily   enoxaparin (LOVENOX) injection  40 mg Subcutaneous Q24H   escitalopram  20 mg Oral Daily   feeding supplement  237 mL Oral BID BM   metoprolol tartrate  12.5 mg Oral BID   mirtazapine  7.5 mg Oral QHS   multivitamin with minerals  1 tablet Oral Daily   Continuous Infusions:   LOS: 3 days    Time spent: 35 minutes    Barb Merino, MD [facility] Pager [phone]

## 2021-06-15 NOTE — Care Management Important Message (Signed)
Important Message  Patient Details  Name: Christine Christensen MRN: [id number] Date of Birth: 11/19/1945   Medicare Important Message Given:  Yes     Lavonne Cass [address] 06/15/2021, 3:40 PM

## 2021-06-15 NOTE — Evaluation (Signed)
Physical Therapy Evaluation Patient Details Name: Christine Christensen MRN: [id number] DOB: 1945/12/11 Today's Date: 06/15/2021  History of Present Illness  Pt is a 75 y.o. female admitted 06/12/21 with fall at home; found to have R femoral neck fx. Sx delayed as pt deemed high risk; pre-op cardiology evaluation for near syncope, mitral stenosis. S/p R hip hemiarthroplasty on 9/27. Pt guarding R arm post-op; RUE imaging negative for acute injury. PMH includes vascular dementia, HTN, CVAs with residual R-side weakness (2005, 2008), Meniere's disease, bilateral TKAs.   Clinical Impression  Pt presents with an overall decrease in functional mobility secondary to above. Pt with h/o dementia and poor historian; pt lives with son, ambulatory with SPC and RW. Today, pt limited by significant RLE pain with any movement, requiring totalA for bed-level mobility and RLE PROM/repositioning. Pt received in significant R hip add/IR, therefore hip abduction pillow ordered and placed to maintain posterior hip precautions (ortho MD aware). Pt would benefit from SNF-level therapies to maximize functional mobility and independence prior to return home.      Recommendations for follow up therapy are one component of a multi-disciplinary discharge planning process, led by the attending physician.  Recommendations may be updated based on patient status, additional functional criteria and insurance authorization.  Follow Up Recommendations SNF    Equipment Recommendations   (TBD)    Recommendations for Other Services OT consult     Precautions / Restrictions Precautions Precautions: Fall;Posterior Hip Restrictions Weight Bearing Restrictions: Yes RLE Weight Bearing: Weight bearing as tolerated      Mobility  Bed Mobility Overal bed mobility: Needs Assistance             General bed mobility comments: TotalA for scooting up in bed, sliding hips and repositioning RLE in order to maintain posterior hip  precautions    Transfers                 General transfer comment: pt unable to tolerate secondary to pain; will require +2 assist  Ambulation/Gait                Stairs            Wheelchair Mobility    Modified Rankin (Stroke Patients Only)       Balance                                             Pertinent Vitals/Pain Pain Assessment: Faces Faces Pain Scale: Hurts worst Pain Location: RLE with any movement Pain Descriptors / Indicators: Spasm;Moaning;Guarding Pain Intervention(s): Limited activity within patient's tolerance;Monitored during session;Premedicated before session;Repositioned    Home Living Family/patient expects to be discharged to:: Private residence Living Arrangements: Children Available Help at Discharge: Family;Available PRN/intermittently Type of Home: House Home Access: Ramped entrance     Home Layout: One level   Additional Comments: Pt poor historian, home set-up info taken from prior admission (2018). Confirmed that son does live with pt; unsure how much assist son able to provide    Prior Function Level of Independence: Needs assistance   Gait / Transfers Assistance Needed: Pt reports ambulatory with SPC or RW     Comments: H/o vascular dementia     Hand Dominance        Extremity/Trunk Assessment   Upper Extremity Assessment Upper Extremity Assessment: Generalized weakness;RUE deficits/detail;LUE deficits/detail RUE Deficits / Details: H/o CVA  with residual R-side weakness; suspect RA in bilateral wrist/fingers LUE Deficits / Details: suspect RA in bilateral wrist/fingers    Lower Extremity Assessment Lower Extremity Assessment: RLE deficits/detail;Difficult to assess due to impaired cognition RLE Deficits / Details: s/p R hip hemiarthroplasty; little to no active movement noted in RLE, very poor tolerance to PROM; received with R hip resting in significant ADD/IR, therefore hip  abduction pillow ordered and placed RLE: Unable to fully assess due to pain       Communication   Communication: HOH  Cognition Arousal/Alertness: Awake/alert Behavior During Therapy: Flat affect Overall Cognitive Status: History of cognitive impairments - at baseline                                 General Comments: Pt with h/o vascular dementia. Able to state name, location, and fact she had a fall and broke her hip. Pt confused at times, inconsistently answering questions and following simple commands correctly. Suspect short-term memory deficits      General Comments General comments (skin integrity, edema, etc.): Pt received with R hip resting in significant ADD/IR, therefore hip abduction pillow ordered and placed on pt with assist +2. Noted significant ankle PF, bilateral PRAFO boots ordered    Exercises Other Exercises Other Exercises: Took ~10-min for RLE PROM/repositioning to hip abduction and internal rotation, pt limited by significant pain and muscle spasm with any RLE movement   Assessment/Plan    PT Assessment Patient needs continued PT services  PT Problem List Decreased strength;Decreased range of motion;Decreased activity tolerance;Decreased balance;Decreased mobility;Decreased cognition;Decreased knowledge of use of DME;Decreased knowledge of precautions;Pain       PT Treatment Interventions DME instruction;Gait training;Functional mobility training;Therapeutic activities;Therapeutic exercise;Balance training;Cognitive remediation;Patient/family education;Wheelchair mobility training    PT Goals (Current goals can be found in the Care Plan section)  Acute Rehab PT Goals Patient Stated Goal: Decreased pain PT Goal Formulation: With patient Time For Goal Achievement: 06/29/21 Potential to Achieve Goals: Fair    Frequency Min 3X/week   Barriers to discharge Decreased caregiver support      Co-evaluation               AM-PAC PT "6  Clicks" Mobility  Outcome Measure Help needed turning from your back to your side while in a flat bed without using bedrails?: Total Help needed moving from lying on your back to sitting on the side of a flat bed without using bedrails?: Total Help needed moving to and from a bed to a chair (including a wheelchair)?: Total Help needed standing up from a chair using your arms (e.g., wheelchair or bedside chair)?: Total Help needed to walk in hospital room?: Total Help needed climbing 3-5 steps with a railing? : Total 6 Click Score: 6    End of Session Equipment Utilized During Treatment: Oxygen Activity Tolerance: Patient limited by pain Patient left: in bed;with call bell/phone within reach;with bed alarm set Nurse Communication: Mobility status;Other (comment) (abduction pillow placed; prevalon boots ordered) PT Visit Diagnosis: Other abnormalities of gait and mobility (R26.89);Muscle weakness (generalized) (M62.81);Pain    Time: 0920-0940 PT Time Calculation (min) (ACUTE ONLY): 20 min   Charges:   PT Evaluation $PT Eval Moderate Complexity: [place], PT, DPT Acute Rehabilitation Services  Pager [phone] Office [place] 06/15/2021, 12:16 PM

## 2021-06-15 NOTE — Progress Notes (Signed)
Orthopedic Tech Progress Note Patient Details:  Christine Christensen 18-Mar-1946 [id number]  Applied with THERAPY   Ortho Devices Type of Ortho Device: Abduction pillow Ortho Device/Splint Location: BETWEEN LEGS Ortho Device/Splint Interventions: Ordered, Application   Post Interventions Patient Tolerated: Well Instructions Provided: Care of device  Janit Pagan 06/15/2021, 12:17 PM

## 2021-06-15 NOTE — Progress Notes (Signed)
Progress Note  Patient Name: Christine Christensen Date of Encounter: 06/15/2021  Primary Cardiologist: None   Subjective   In pain between legs but no CP or SOB. Seen without son at bedside, she appears clear today.   Inpatient Medications    Scheduled Meds:  aspirin  325 mg Oral Daily   enoxaparin (LOVENOX) injection  40 mg Subcutaneous Q24H   escitalopram  20 mg Oral Daily   feeding supplement  237 mL Oral BID BM   metoprolol tartrate  12.5 mg Oral BID   multivitamin with minerals  1 tablet Oral Daily   Continuous Infusions:  PRN Meds: bisacodyl, HYDROcodone-acetaminophen, HYDROmorphone (DILAUDID) injection, senna-docusate   Vital Signs    Vitals:   06/14/21 2102 06/14/21 2300 06/15/21 0317 06/15/21 0727  BP: 113/66 (!) 95/51 90/65 (!) 95/51  Pulse: 84 81 75 63  Resp:  14 14 11   Temp:  97.6 F (36.4 C) 98.2 F (36.8 C)   TempSrc:  Oral Oral   SpO2:  96% 97% 97%  Weight:      Height:        Intake/Output Summary (Last 24 hours) at 06/15/2021 0736 Last data filed at 06/15/2021 0728 Gross per 24 hour  Intake 1540 ml  Output 1025 ml  Net 515 ml   Filed Weights   06/12/21 1709 06/13/21 1912  Weight: 53.1 kg 53 kg    Telemetry    SR - Personally Reviewed  ECG    No new - Personally Reviewed  Physical Exam   GEN: No acute distress.   Neck: No JVD Cardiac: RRR, 3/6 diastolic murmur LLSB Respiratory: Clear to auscultation bilaterally. GI: Soft, nontender, non-distended  MS: No edema Neuro:  Nonfocal  Psych: Normal affect   Labs    Chemistry Recent Labs  Lab 06/12/21 1530 06/14/21 0052  NA 139 135  K 3.7 3.5  CL 104 104  CO2 26 22  GLUCOSE 101* 94  BUN 13 12  CREATININE 0.87 0.72  CALCIUM 9.7 8.9  PROT 7.6  --   ALBUMIN 3.9  --   AST 19  --   ALT 13  --   ALKPHOS 60  --   BILITOT 0.7  --   GFRNONAA >60 >60  ANIONGAP 9 9     Hematology Recent Labs  Lab 06/12/21 1530 06/14/21 0052  WBC 8.2 9.0  RBC 4.76 4.00  HGB 14.2 12.0   HCT 42.3 35.7*  MCV 88.9 89.3  MCH 29.8 30.0  MCHC 33.6 33.6  RDW 14.9 14.6  PLT 219 159    Cardiac EnzymesNo results for input(s): TROPONINI in the last 168 hours. No results for input(s): TROPIPOC in the last 168 hours.   BNPNo results for input(s): BNP, PROBNP in the last 168 hours.   DDimer No results for input(s): DDIMER in the last 168 hours.   Radiology    DG ELBOW COMPLETE RIGHT (3+VIEW)  Result Date: 06/15/2021 CLINICAL DATA:  Right elbow fracture EXAM: RIGHT ELBOW - COMPLETE 3+ VIEW COMPARISON:  None. FINDINGS: No displaced coronoid process fracture of the proximal right radius. No joint effusion or fat pad displacement. Alignment is normal. IMPRESSION: No displaced coronoid process fracture visible. Electronically Signed   By: Ulyses Jarred M.D.   On: 06/15/2021 02:33   DG Forearm Right  Result Date: 06/14/2021 CLINICAL DATA:  Fall EXAM: RIGHT FOREARM - 2 VIEW COMPARISON:  None. FINDINGS: Possible fracture deformity at the coronoid process of the ulna at the elbow.  No malalignment. IMPRESSION: Findings are indeterminate for coronoid process fracture at the elbow, recommend dedicated views of the right elbow. These results will be called to the ordering clinician or representative by the Radiologist Assistant, and communication documented in the PACS or [employer]. Electronically Signed   By: Donavan Foil M.D.   On: 06/14/2021 20:09   DG Pelvis Portable  Result Date: 06/14/2021 CLINICAL DATA:  Right hip hemiarthroplasty EXAM: PORTABLE PELVIS 1-2 VIEWS COMPARISON:  06/12/2021 FINDINGS: Single frontal view of the pelvis was obtained, limited by portable technique. Right hip hemiarthroplasty is identified in the expected position without signs of acute complication. Remainder of the bony pelvis is unremarkable. IMPRESSION: 1. Unremarkable right hip hemiarthroplasty. Electronically Signed   By: Randa Ngo M.D.   On: 06/14/2021 16:50   DG Ankle Right Port  Result Date:  06/13/2021 CLINICAL DATA:  Pain EXAM: PORTABLE RIGHT ANKLE - 2 VIEW COMPARISON:  None. FINDINGS: There is no evidence of fracture, dislocation, or joint effusion. There is no evidence of arthropathy or other focal bone abnormality. Soft tissue edema about the medial foot. IMPRESSION: No fracture or dislocation of the right ankle. Soft tissue edema about the medial foot. Electronically Signed   By: Eddie Candle M.D.   On: 06/13/2021 13:21   DG Humerus Right  Result Date: 06/14/2021 CLINICAL DATA:  Fall with elbow pain EXAM: RIGHT HUMERUS - 2+ VIEW COMPARISON:  None. FINDINGS: There is no evidence of fracture or other focal bone lesions. Soft tissues are unremarkable. IMPRESSION: Negative. Electronically Signed   By: Donavan Foil M.D.   On: 06/14/2021 20:09   ECHOCARDIOGRAM COMPLETE  Result Date: 06/13/2021    ECHOCARDIOGRAM REPORT   Patient Name:   Christine Christensen [facility] Date of Exam: 06/13/2021 Medical Rec #:  [id number]        Height:       63.5 in Accession #:    [id number]       Weight:       117.0 lb Date of Birth:  01-06-1946         BSA:          1.549 m Patient Age:    61 years         BP:           142/76 mmHg Patient Gender: F                HR:           71 bpm. Exam Location:  Inpatient Procedure: 2D Echo, 3D Echo, Cardiac Doppler and Color Doppler                                 MODIFIED REPORT:   This report was modified by Lyman Bishop MD on 06/13/2021 due to Noted LVEDP      elevated - however, difficult to quantify due to heavy mitral annular              calcification - suspect LVEDP value was overestimated.  Indications:     R55 Syncope  History:         Patient has prior history of Echocardiogram examinations, most                  recent 07/27/2017. Stroke, Aortic Valve Disease and Mitral Valve                  Disease, Signs/Symptoms:Murmur and  Syncope; Risk                  Factors:Hypertension. Mitral and aortic stenosis.  Sonographer:     Roseanna Rainbow RDCS Referring Phys:  [id number] Lequita Halt  Diagnosing Phys: Lyman Bishop MD IMPRESSIONS  1. Left ventricular ejection fraction, by estimation, is >75%. Left ventricular ejection fraction by 2D MOD biplane is 75.8 %. The left ventricle has hyperdynamic function. The left ventricle has no regional wall motion abnormalities. There is mild left  ventricular hypertrophy. Left ventricular diastolic parameters are consistent with Grade II diastolic dysfunction (pseudonormalization). Elevated left ventricular end-diastolic pressure.  2. Right ventricular systolic function is hyperdynamic. The right ventricular size is normal. There is severely elevated pulmonary artery systolic pressure. The estimated right ventricular systolic pressure is 67.3 mmHg.  3. The mitral valve is abnormal. Trivial mitral valve regurgitation. Severe mitral stenosis. The mean mitral valve gradient is 18.0 mmHg with average heart rate of 79 bpm. Severe mitral annular calcification.  4. There is heavy calcification of the aortic root uncluding a calcificed mass below the aortic valve annulus in the aorta/mitral curtain. The aortic valve is tricuspid. There is mild calcification of the aortic valve. Aortic valve regurgitation is not visualized. Mild aortic valve stenosis. Aortic valve mean gradient measures 10.4 mmHg. AVA around 1.56 cm2, DI 0.50  5. The inferior vena cava is normal in size with <50% respiratory variability, suggesting right atrial pressure of 8 mmHg. Comparison(s): Changes from prior study are noted. 07/27/2017: LVEF 60-65%, mild LVH, grade 1 DD with elevated LVEDP, Moderate MS - mean gradient 10 mmHg, mild AS - mean gradient 10 mmHg. Conclusion(s)/Recommendation(s): There is severe mitral stenosis with severe pulmonary hypertension and high LV filling pressure - the aortic valve appears unchanged, however, there is heavy aortic root calcification - consider CT surgery consultation. FINDINGS  Left Ventricle: Left ventricular ejection fraction, by estimation, is >75%. Left  ventricular ejection fraction by 2D MOD biplane is 75.8 %. The left ventricle has hyperdynamic function. The left ventricle has no regional wall motion abnormalities. 3D left ventricular ejection fraction analysis performed but not reported based on interpreter judgement due to suboptimal quality. The left ventricular internal cavity size was normal in size. There is mild left ventricular hypertrophy. Left ventricular diastolic parameters are consistent with Grade II diastolic dysfunction (pseudonormalization). Elevated left ventricular end-diastolic pressure. Right Ventricle: The right ventricular size is normal. No increase in right ventricular wall thickness. Right ventricular systolic function is hyperdynamic. There is severely elevated pulmonary artery systolic pressure. The tricuspid regurgitant velocity  is 3.96 m/s, and with an assumed right atrial pressure of 8 mmHg, the estimated right ventricular systolic pressure is 41.9 mmHg. Left Atrium: Left atrial size was normal in size. Right Atrium: Right atrial size was normal in size. Pericardium: There is no evidence of pericardial effusion. Mitral Valve: The mitral valve is abnormal. Severe mitral annular calcification. Trivial mitral valve regurgitation. Severe mitral valve stenosis. MV peak gradient, 31.9 mmHg. The mean mitral valve gradient is 18.0 mmHg with average heart rate of 79 bpm. Tricuspid Valve: The tricuspid valve is grossly normal. Tricuspid valve regurgitation is mild. Aortic Valve: There is heavy calcification of the aortic root uncluding a calcificed mass below the aortic valve annulus in the aorta/mitral curtain. The aortic valve is tricuspid. There is mild calcification of the aortic valve. There is moderate to severe aortic valve annular calcification. Aortic valve regurgitation is not visualized. Mild aortic stenosis is present. Aortic valve mean gradient  measures 10.4 mmHg. Aortic valve peak gradient measures 20.2 mmHg. Aortic valve area,  by VTI measures 1.28 cm. Pulmonic Valve: The pulmonic valve was grossly normal. Pulmonic valve regurgitation is trivial. Aorta: The aortic root and ascending aorta are structurally normal, with no evidence of dilitation. Venous: The inferior vena cava is normal in size with less than 50% respiratory variability, suggesting right atrial pressure of 8 mmHg. IAS/Shunts: There is right bowing of the interatrial septum, suggestive of elevated left atrial pressure. No atrial level shunt detected by color flow Doppler.  LEFT VENTRICLE PLAX 2D                        Biplane EF (MOD) LVIDd:         3.50 cm         LV Biplane EF:   Left LVIDs:         2.30 cm                          ventricular LV PW:         0.90 cm                          ejection LV IVS:        1.30 cm                          fraction by LVOT diam:     1.80 cm                          2D MOD LV SV:         56                               biplane is LV SV Index:   36                               75.8 %. LVOT Area:     2.54 cm                                Diastology                                LV e' medial:    3.48 cm/s LV Volumes (MOD)               LV E/e' medial:  72.7 LV vol d, MOD    24.5 ml       LV e' lateral:   4.65 cm/s A2C:                           LV E/e' lateral: 54.4 LV vol d, MOD    45.9 ml A4C: LV vol s, MOD    4.9 ml A2C: LV vol s, MOD    12.5 ml       3D Volume EF: A4C:                           3D EF:  66 % LV SV MOD A2C:   19.6 ml       LV EDV:       73 ml LV SV MOD A4C:   45.9 ml       LV ESV:       25 ml LV SV MOD BP:    25.6 ml       LV SV:        48 ml RIGHT VENTRICLE             IVC RV S prime:     13.70 cm/s  IVC diam: 1.40 cm TAPSE (M-mode): 2.1 cm LEFT ATRIUM             Index       RIGHT ATRIUM           Index LA diam:        4.40 cm 2.84 cm/m  RA Area:     11.70 cm LA Vol (A2C):   40.5 ml 26.15 ml/m RA Volume:   24.80 ml  16.01 ml/m LA Vol (A4C):   48.1 ml 31.06 ml/m LA Biplane Vol: 45.0 ml 29.06 ml/m   AORTIC VALVE AV Area (Vmax):    1.56 cm AV Area (Vmean):   1.51 cm AV Area (VTI):     1.28 cm AV Vmax:           224.80 cm/s AV Vmean:          144.000 cm/s AV VTI:            0.434 m AV Peak Grad:      20.2 mmHg AV Mean Grad:      10.4 mmHg LVOT Vmax:         138.00 cm/s LVOT Vmean:        85.400 cm/s LVOT VTI:          0.219 m LVOT/AV VTI ratio: 0.50  AORTA Ao Root diam: 2.70 cm Ao Asc diam:  2.80 cm MITRAL VALVE                TRICUSPID VALVE MV Area (PHT): 2.73 cm     TR Peak grad:   62.7 mmHg MV Area VTI:   0.57 cm     TR Vmax:        396.00 cm/s MV Peak grad:  31.9 mmHg MV Mean grad:  18.0 mmHg    SHUNTS MV Vmax:       2.82 m/s     Systemic VTI:  0.22 m MV Vmean:      197.0 cm/s   Systemic Diam: 1.80 cm MV Decel Time: 278 msec MV E velocity: 253.00 cm/s MV A velocity: 238.00 cm/s MV E/A ratio:  1.06 Lyman Bishop MD Electronically signed by Lyman Bishop MD Signature Date/Time: 06/13/2021/10:35:48 AM    Final (Updated)     Cardiac Studies   Echo as above  Patient Profile     75 y.o. female with a history of CVA 2003 and 2008 with right sided weakness, vascular dementia, hypertension, severe mitral stenosis, mild aortic stenosis, urinary incontinence, Mnire's disease, and arthritis s/p bilateral TKAs. Cardiology is consulted for near syncope and mitral stenosis as well as pre-op risk assessment. She is post op from hip surgery  Assessment & Plan   Active Problems:   Closed right hip fracture, initial encounter ([place])   Hip fracture (HCC)   Protein-calorie malnutrition, severe   Severe mitral stenosis with severe mitral annular calcification- Tolerated surgery well  from hemodynamic standpoint. I/O not charted but appears she is net positive overall. Not SOB, no signs of HF. Encourage oral intake, appears euvolemic.  Can continue metoprolol tartrate 12.5 mg BID if well tolerated from BP and HR standpoint, those parameters are acceptable today. If dizzy with standing or hypotension  present, can discontinue as this will only be mildly helpful given severity of mitral valve disease.   Cardiology will sign off at this time. Can arrange follow up if needed at time of discharge.   [facility] will sign off.    For questions or updates, please contact [place] Please consult [email or web address] for contact info under        Signed, Elouise Munroe, MD  06/15/2021, 7:36 AM

## 2021-06-16 ENCOUNTER — Encounter (HOSPITAL_COMMUNITY): Payer: Self-pay | Admitting: Orthopedic Surgery

## 2021-06-16 DIAGNOSIS — S72001A Fracture of unspecified part of neck of right femur, initial encounter for closed fracture: Secondary | ICD-10-CM | POA: Diagnosis not present

## 2021-06-16 MED ORDER — SODIUM CHLORIDE 0.9 % IV SOLN: INTRAVENOUS | Status: DC

## 2021-06-16 NOTE — Progress Notes (Signed)
PROGRESS NOTE    Christine Christensen  RDE:[id number] DOB: 1945/09/25 DOA: 06/12/2021 PCP: Nolene Ebbs, MD    Brief Narrative:  Past medical history of CVA with right-sided weakness, HTN, vascular dementia, Mnire's disease, moderate mitral stenosis.  Presents with complaints of a sudden fall in the kitchen and found to have right femoral neck fracture. Initially admitted to [facility] and transferred to [facility] for anesthesia recommendation due to high risk. Right hip hemiarthroplasty 9/27.   Assessment & Plan:   Active Problems:   Closed right hip fracture, initial encounter [facility])   Hip fracture (HCC)   Protein-calorie malnutrition, severe  Close traumatic right hip fracture: Right hip hemiarthroplasty 9/27 Weightbearing as tolerated, working with therapies.  Recommended skilled nursing facility. Use adequate pain medications, oxycodone.  To minimize use of narcotics, started on scheduled doses of Tylenol 1 g 4 times a day for next few days. Lovenox for DVT prophylaxis.  Probably can go home on aspirin. Orthopedics following.  Severe calcific mitral valve stenosis with severe pulmonary hypertension and mild aortic valve stenosis: Seen by cardiology.  Recommended conservative management.  Started on beta-blockers with metoprolol 12.5 mg twice daily.  We will continue on discharge. Fall precautions.  Delirium precautions. She does have severe mitral valve stenosis, advanced age and chronic ambulatory dysfunction from previous strokes.  History of stroke, right hemiparesis: On aspirin.  CTA of the head without acute findings.  Essential hypertension: Well-controlled.  Acute metabolic encephalopathy in the setting of vascular dementia: Anticipated delirium from surgery, medication use and anesthesia along with ICU stay. Will avoid benzodiazepines.  Minimize narcotics with use of Tylenol. Start mobilizing. Allow visitors to stay off hours.  Medically  stabilizing.  Continue to mobilize.  Anticipate discharge to skilled nursing facility tomorrow.    DVT prophylaxis: enoxaparin (LOVENOX) injection 40 mg Start: 06/13/21 1000   Code Status: Full code Family Communication: None today. Disposition Plan: Status is: Inpatient  Remains inpatient appropriate because:Inpatient level of care appropriate due to severity of illness  Dispo: The patient is from: Home              Anticipated d/c is to: SNF              Patient currently is not medically stable to d/c.   Difficult to place patient No         Consultants:  Orthopedics Cardiology  Procedures:  Right hip hemiarthroplasty 9/27  Antimicrobials:  Perioperative   Subjective: Patient seen and examined.  Early morning hours, she was awake.  Denied any complaints.  Today she is less participating after receiving a dose of Dilaudid in the morning.  Went back to examine her, she did not wake up to eat her breakfast.  She tells me that she had a good breakfast along with coffee. Nursing were concerned about her not eating and having yellow sclera. Will start patient on low-dose maintenance IV fluids until she has well-established oral intake. Pain is managed, advised to hold off on opiates until she has therapies or required for visible pain.  Objective: Vitals:   06/16/21 0400 06/16/21 0413 06/16/21 0735 06/16/21 1143  BP: 109/67 109/67 118/65 126/73  Pulse: 81 76 99 99  Resp: 15 16 14 15   Temp:  99.4 F (37.4 C) 99.1 F (37.3 C) 98.9 F (37.2 C)  TempSrc:  Oral Oral Oral  SpO2: 100% 100% 99% 98%  Weight:      Height:  Intake/Output Summary (Last 24 hours) at 06/16/2021 1300 Last data filed at 06/16/2021 1200 Gross per 24 hour  Intake 720 ml  Output 2000 ml  Net -1280 ml   Filed Weights   06/12/21 1709 06/13/21 1912  Weight: 53.1 kg 53 kg    Examination:  General exam: Appears calm and comfortable , sleepy today but interacts and answers few  questions. Frail and debilitated.  Chronically sick looking.  Not in any distress. Respiratory system: Clear to auscultation. Respiratory effort normal.  On room air.  No added sounds. Cardiovascular system: S1 & S2 heard, RRR.  Gastrointestinal system: Soft and nontender.  Bowel sounds present.. Central nervous system: Sleepy but alert on his stimulation.  Without any hallucinations or delusions.  Remains quiet and calm with occasional impulsiveness. Extremities: Right lateral thigh incision clean and dry.  Dressing intact. Distal neurovascular status intact.  Tenderness on movement.     Data Reviewed: I have personally reviewed following labs and imaging studies  CBC: Recent Labs  Lab 06/12/21 1530 06/14/21 0052 06/15/21 1036  WBC 8.2 9.0 11.6*  NEUTROABS 7.1  --   --   HGB 14.2 12.0 10.1*  HCT 42.3 35.7* 30.6*  MCV 88.9 89.3 88.7  PLT 219 159 335*   Basic Metabolic Panel: Recent Labs  Lab 06/12/21 1530 06/14/21 0052  NA 139 135  K 3.7 3.5  CL 104 104  CO2 26 22  GLUCOSE 101* 94  BUN 13 12  CREATININE 0.87 0.72  CALCIUM 9.7 8.9   GFR: Estimated Creatinine Clearance: 50.3 mL/min (by C-G formula based on SCr of 0.72 mg/dL). Liver Function Tests: Recent Labs  Lab 06/12/21 1530  AST 19  ALT 13  ALKPHOS 60  BILITOT 0.7  PROT 7.6  ALBUMIN 3.9   No results for input(s): LIPASE, AMYLASE in the last 168 hours. No results for input(s): AMMONIA in the last 168 hours. Coagulation Profile: No results for input(s): INR, PROTIME in the last 168 hours. Cardiac Enzymes: No results for input(s): CKTOTAL, CKMB, CKMBINDEX, TROPONINI in the last 168 hours. BNP (last 3 results) No results for input(s): PROBNP in the last 8760 hours. HbA1C: No results for input(s): HGBA1C in the last 72 hours. CBG: No results for input(s): GLUCAP in the last 168 hours. Lipid Profile: No results for input(s): CHOL, HDL, LDLCALC, TRIG, CHOLHDL, LDLDIRECT in the last 72 hours. Thyroid  Function Tests: No results for input(s): TSH, T4TOTAL, FREET4, T3FREE, THYROIDAB in the last 72 hours. Anemia Panel: No results for input(s): VITAMINB12, FOLATE, FERRITIN, TIBC, IRON, RETICCTPCT in the last 72 hours. Sepsis Labs: No results for input(s): PROCALCITON, LATICACIDVEN in the last 168 hours.  Recent Results (from the past 240 hour(s))  Resp Panel by RT-PCR (Flu A&B, Covid) Nasopharyngeal Swab     Status: None   Collection Time: 06/12/21  3:30 PM   Specimen: Nasopharyngeal Swab; Nasopharyngeal(NP) swabs in vial transport medium  Result Value Ref Range Status   SARS Coronavirus 2 by RT PCR NEGATIVE NEGATIVE Final    Comment: (NOTE) SARS-CoV-2 target nucleic acids are NOT DETECTED.  The SARS-CoV-2 RNA is generally detectable in upper respiratory specimens during the acute phase of infection. The lowest concentration of SARS-CoV-2 viral copies this assay can detect is 138 copies/mL. A negative result does not preclude SARS-Cov-2 infection and should not be used as the sole basis for treatment or other patient management decisions. A negative result may occur with  improper specimen collection/handling, submission of specimen other than nasopharyngeal swab,  presence of viral mutation(s) within the areas targeted by this assay, and inadequate number of viral copies(<138 copies/mL). A negative result must be combined with clinical observations, patient history, and epidemiological information. The expected result is Negative.  Fact Sheet for Patients:  EntrepreneurPulse.com.au  Fact Sheet for Healthcare Providers:  IncredibleEmployment.be  This test is no t yet approved or cleared by the [place] FDA and  has been authorized for detection and/or diagnosis of SARS-CoV-2 by FDA under an Emergency Use Authorization (EUA). This EUA will remain  in effect (meaning this test can be used) for the duration of the COVID-19 declaration under  Section 564(b)(1) of the Act, 21 U.S.C.section 360bbb-3(b)(1), unless the authorization is terminated  or revoked sooner.       Influenza A by PCR NEGATIVE NEGATIVE Final   Influenza B by PCR NEGATIVE NEGATIVE Final    Comment: (NOTE) The Xpert Xpress SARS-CoV-2/FLU/RSV plus assay is intended as an aid in the diagnosis of influenza from Nasopharyngeal swab specimens and should not be used as a sole basis for treatment. Nasal washings and aspirates are unacceptable for Xpert Xpress SARS-CoV-2/FLU/RSV testing.  Fact Sheet for Patients: EntrepreneurPulse.com.au  Fact Sheet for Healthcare Providers: IncredibleEmployment.be  This test is not yet approved or cleared by the [place] FDA and has been authorized for detection and/or diagnosis of SARS-CoV-2 by FDA under an Emergency Use Authorization (EUA). This EUA will remain in effect (meaning this test can be used) for the duration of the COVID-19 declaration under Section 564(b)(1) of the Act, 21 U.S.C. section 360bbb-3(b)(1), unless the authorization is terminated or revoked.  Performed at [facility], [place] [address], [place], [place] [address]   Surgical pcr screen     Status: None   Collection Time: 06/13/21 10:14 AM   Specimen: Nasal Mucosa; Nasal Swab  Result Value Ref Range Status   MRSA, PCR NEGATIVE NEGATIVE Final   Staphylococcus aureus NEGATIVE NEGATIVE Final    Comment: (NOTE) The Xpert SA Assay (FDA approved for NASAL specimens in patients 39 years of age and older), is one component of a comprehensive surveillance program. It is not intended to diagnose infection nor to guide or monitor treatment. Performed at [facility], [place] [address] [place], [place] [address]          Radiology Studies: DG ELBOW COMPLETE RIGHT (3+VIEW)  Result Date: 06/15/2021 CLINICAL DATA:  Right elbow fracture EXAM: RIGHT ELBOW - COMPLETE 3+ VIEW COMPARISON:   None. FINDINGS: No displaced coronoid process fracture of the proximal right radius. No joint effusion or fat pad displacement. Alignment is normal. IMPRESSION: No displaced coronoid process fracture visible. Electronically Signed   By: Ulyses Jarred M.D.   On: 06/15/2021 02:33   DG Forearm Right  Result Date: 06/14/2021 CLINICAL DATA:  Fall EXAM: RIGHT FOREARM - 2 VIEW COMPARISON:  None. FINDINGS: Possible fracture deformity at the coronoid process of the ulna at the elbow. No malalignment. IMPRESSION: Findings are indeterminate for coronoid process fracture at the elbow, recommend dedicated views of the right elbow. These results will be called to the ordering clinician or representative by the Radiologist Assistant, and communication documented in the PACS or [employer]. Electronically Signed   By: Donavan Foil M.D.   On: 06/14/2021 20:09   DG Pelvis Portable  Result Date: 06/14/2021 CLINICAL DATA:  Right hip hemiarthroplasty EXAM: PORTABLE PELVIS 1-2 VIEWS COMPARISON:  06/12/2021 FINDINGS: Single frontal view of the pelvis was obtained, limited by portable technique. Right  hip hemiarthroplasty is identified in the expected position without signs of acute complication. Remainder of the bony pelvis is unremarkable. IMPRESSION: 1. Unremarkable right hip hemiarthroplasty. Electronically Signed   By: Randa Ngo M.D.   On: 06/14/2021 16:50   DG Humerus Right  Result Date: 06/14/2021 CLINICAL DATA:  Fall with elbow pain EXAM: RIGHT HUMERUS - 2+ VIEW COMPARISON:  None. FINDINGS: There is no evidence of fracture or other focal bone lesions. Soft tissues are unremarkable. IMPRESSION: Negative. Electronically Signed   By: Donavan Foil M.D.   On: 06/14/2021 20:09        Scheduled Meds:  acetaminophen  1,000 mg Oral Q6H   aspirin  325 mg Oral Daily   Chlorhexidine Gluconate Cloth  6 each Topical Daily   enoxaparin (LOVENOX) injection  40 mg Subcutaneous Q24H   escitalopram  20 mg Oral Daily    feeding supplement  237 mL Oral BID BM   metoprolol tartrate  12.5 mg Oral BID   mirtazapine  7.5 mg Oral QHS   multivitamin with minerals  1 tablet Oral Daily   Continuous Infusions:  sodium chloride 50 mL/hr at 06/16/21 1106     LOS: 4 days    Time spent: 28 minutes    Barb Merino, MD [facility] Pager [phone]

## 2021-06-16 NOTE — Progress Notes (Signed)
While receiving report pt had pulled off heart monitor leads and upon entering the room we realized she had pulled out foley as well, bulb was still inflated. Area was clean/dry with no discharge. Applied external Purewick and will monitor patient closely.

## 2021-06-16 NOTE — NC FL2 (Signed)
[place] MEDICAID FL2 LEVEL OF CARE SCREENING TOOL     IDENTIFICATION  Patient Name: Christine Christensen Birthdate: [birth year] Sex: female Admission Date (Current Location): 06/12/2021  [facility] and [place] Number:  Herbalist and Address:  [facility] [place], [place] [address], [place], [place] [address]      Provider Number: [id number]  Attending Physician Name and Address:  Barb Merino, MD  Relative Name and Phone Number:  Aleli, [place])   [phone]    Current Level of Care: Hospital Recommended Level of Care: [place] Prior Approval Number:    Date Approved/Denied:   PASRR Number: [id number]  Discharge Plan: SNF    Current Diagnoses: Patient Active Problem List   Diagnosis Date Noted   Protein-calorie malnutrition, severe 06/13/2021   Closed right hip fracture, initial encounter ([place]) 06/12/2021   Hip fracture ([place]) 06/12/2021   Fall    Syncope and collapse 10/25/2017   S/P total knee replacement 08/06/2017    Orientation RESPIRATION BLADDER Height & Weight        O2 (3L Nasal Cannula) Incontinent, External catheter Weight: 116 lb 13.5 oz (53 kg) Height:  5\' 3"  (160 cm)  BEHAVIORAL SYMPTOMS/MOOD NEUROLOGICAL BOWEL NUTRITION STATUS      Incontinent Diet (See DC Summary)  AMBULATORY STATUS COMMUNICATION OF NEEDS Skin   Extensive Assist   Skin abrasions (L buttocks)                       Personal Care Assistance Level of Assistance  Bathing, Feeding, Dressing Bathing Assistance: Maximum assistance Feeding assistance: Independent Dressing Assistance: Maximum assistance     Functional Limitations Info  Sight, Speech, Hearing Sight Info: Adequate Hearing Info: Adequate Speech Info: Adequate    SPECIAL CARE FACTORS FREQUENCY  PT (By licensed PT), OT (By licensed OT)     PT Frequency: 5x a week OT Frequency: 5x a week            Contractures Contractures Info: Not present    Additional  Factors Info  Code Status, Allergies Code Status Info: Full Allergies Info: NA           Current Medications (06/16/2021):  This is the current hospital active medication list Current Facility-Administered Medications  Medication Dose Route Frequency Provider Last Rate Last Admin   acetaminophen (TYLENOL) tablet 1,000 mg  1,000 mg Oral Q6H Barb Merino, MD   500 mg at 06/15/21 2213   aspirin tablet 325 mg  325 mg Oral Daily Willaim Sheng, MD   325 mg at 06/15/21 1131   bisacodyl (DULCOLAX) EC tablet 5 mg  5 mg Oral Daily PRN Willaim Sheng, MD       Chlorhexidine Gluconate Cloth 2 % PADS 6 each  6 each Topical Daily Ghimire, Kuber, MD       enoxaparin (LOVENOX) injection 40 mg  40 mg Subcutaneous Q24H Willaim Sheng, MD   40 mg at 06/15/21 1130   escitalopram (LEXAPRO) tablet 20 mg  20 mg Oral Daily Willaim Sheng, MD   20 mg at 06/15/21 1131   feeding supplement (ENSURE SURGERY) liquid 237 mL  237 mL Oral BID BM Willaim Sheng, MD   237 mL at 06/15/21 1605   HYDROcodone-acetaminophen (NORCO/VICODIN) 5-325 MG per tablet 1-2 tablet  1-2 tablet Oral Q6H PRN Willaim Sheng, MD   2 tablet at 06/15/21 2011   HYDROmorphone (DILAUDID) injection 0.5 mg  0.5 mg Intravenous  Q2H PRN Willaim Sheng, MD   0.5 mg at 06/16/21 0519   metoprolol tartrate (LOPRESSOR) tablet 12.5 mg  12.5 mg Oral BID Willaim Sheng, MD   12.5 mg at 06/15/21 2213   mirtazapine (REMERON) tablet 7.5 mg  7.5 mg Oral QHS Barb Merino, MD   7.5 mg at 06/15/21 2213   multivitamin with minerals tablet 1 tablet  1 tablet Oral Daily Willaim Sheng, MD   1 tablet at 06/15/21 1131   senna-docusate (Senokot-S) tablet 1 tablet  1 tablet Oral QHS PRN Willaim Sheng, MD         Discharge Medications: Please see discharge summary for a list of discharge medications.  Relevant Imaging Results:  Relevant Lab Results:   Additional Information SS#:[id number]; Pfizer  COVID-19 Vaccine 06/22/2020  Reece Agar, LCSWA

## 2021-06-16 NOTE — Progress Notes (Signed)
     Subjective:  Patient doing well this afternoon.  She is disoriented currently but pleasant. Denies any pain. Tolerated range of motion of her hip without discomfort. KI removed from the right leg.   Objective:   VITALS:   Vitals:   06/16/21 0400 06/16/21 0413 06/16/21 0735 06/16/21 1143  BP: 109/67 109/67 118/65 126/73  Pulse: 81 76 99 99  Resp: 15 16 14 15   Temp:  99.4 F (37.4 C) 99.1 F (37.3 C) 98.9 F (37.2 C)  TempSrc:  Oral Oral Oral  SpO2: 100% 100% 99% 98%  Weight:      Height:        Intact pulses distally Incision: dressing C/D/I Patient not cooperative with motor or sensory exam Continues to hold the right ankle in an inverted position, can be passively corrected to neutral. she holds the right arm at her side and elbow flexed.  Lab Results  Component Value Date   WBC 11.6 (H) 06/15/2021   HGB 10.1 (L) 06/15/2021   HCT 30.6 (L) 06/15/2021   MCV 88.7 06/15/2021   PLT 146 (L) 06/15/2021   BMET    Component Value Date/Time   NA 135 06/14/2021 0052   K 3.5 06/14/2021 0052   CL 104 06/14/2021 0052   CO2 22 06/14/2021 0052   GLUCOSE 94 06/14/2021 0052   BUN 12 06/14/2021 0052   CREATININE 0.72 06/14/2021 0052   CREATININE 0.75 03/03/2021 1053   CALCIUM 8.9 06/14/2021 0052   GFRNONAA >60 06/14/2021 0052   GFRNONAA 78 03/03/2021 1053     Assessment/Plan: 2 Days Post-Op   Active Problems:   Closed right hip fracture, initial encounter ([place])   Hip fracture (HCC)   Protein-calorie malnutrition, severe   Right femoral neck fracture status post hemiarthroplasty on 9/27  X-rays Right humerus and forearm -negative for fx  Post op recs: WB: WBAT with posterior hip precautions x6 weeks, hip abduction pillow to be used when in bed. Discontinue Knee Immobilizer Abx: ancef x23 hours post op Imaging: intraop xrays Dressing: Aquacel to be left intact until follow-up  DVT prophylaxis: lovenox starting POD1 x4 weeks Follow up: 2 weeks after surgery  for a wound check  Wilder Amodei A Nyssa Sayegh 06/16/2021, 1:51 PM   Charlies Constable, MD Cell [phone]

## 2021-06-17 ENCOUNTER — Inpatient Hospital Stay (HOSPITAL_COMMUNITY): Payer: Medicare (Managed Care)

## 2021-06-17 DIAGNOSIS — S72001A Fracture of unspecified part of neck of right femur, initial encounter for closed fracture: Secondary | ICD-10-CM | POA: Diagnosis not present

## 2021-06-17 DIAGNOSIS — L8990 Pressure ulcer of unspecified site, unspecified stage: Secondary | ICD-10-CM | POA: Diagnosis present

## 2021-06-17 LAB — PHOSPHORUS: Phosphorus: 2.8 mg/dL (ref 2.5–4.6)

## 2021-06-17 LAB — COMPREHENSIVE METABOLIC PANEL WITH GFR
ALT: 12 U/L (ref 0–44)
AST: 17 U/L (ref 15–41)
Albumin: 2.5 g/dL — ABNORMAL LOW (ref 3.5–5.0)
Alkaline Phosphatase: 64 U/L (ref 38–126)
Anion gap: 9 (ref 5–15)
BUN: 5 mg/dL — ABNORMAL LOW (ref 8–23)
CO2: 27 mmol/L (ref 22–32)
Calcium: 8.7 mg/dL — ABNORMAL LOW (ref 8.9–10.3)
Chloride: 103 mmol/L (ref 98–111)
Creatinine, Ser: 0.58 mg/dL (ref 0.44–1.00)
GFR, Estimated: >60 mL/min
Glucose, Bld: 92 mg/dL (ref 70–99)
Potassium: 3.6 mmol/L (ref 3.5–5.1)
Sodium: 139 mmol/L (ref 135–145)
Total Bilirubin: 0.7 mg/dL (ref 0.3–1.2)
Total Protein: 5.6 g/dL — ABNORMAL LOW (ref 6.5–8.1)

## 2021-06-17 LAB — CBC WITH DIFFERENTIAL/PLATELET
Abs Immature Granulocytes: 0.03 10*3/uL (ref 0.00–0.07)
Basophils Absolute: 0.1 10*3/uL (ref 0.0–0.1)
Basophils Relative: 1 %
Eosinophils Absolute: 0.2 10*3/uL (ref 0.0–0.5)
Eosinophils Relative: 3 %
HCT: 31.1 % — ABNORMAL LOW (ref 36.0–46.0)
Hemoglobin: 10.6 g/dL — ABNORMAL LOW (ref 12.0–15.0)
Immature Granulocytes: 0 %
Lymphocytes Relative: 20 %
Lymphs Abs: 1.6 10*3/uL (ref 0.7–4.0)
MCH: 29.8 pg (ref 26.0–34.0)
MCHC: 34.1 g/dL (ref 30.0–36.0)
MCV: 87.4 fL (ref 80.0–100.0)
Monocytes Absolute: 0.9 10*3/uL (ref 0.1–1.0)
Monocytes Relative: 12 %
Neutro Abs: 5.1 10*3/uL (ref 1.7–7.7)
Neutrophils Relative %: 64 %
Platelets: 192 10*3/uL (ref 150–400)
RBC: 3.56 MIL/uL — ABNORMAL LOW (ref 3.87–5.11)
RDW: 14.4 % (ref 11.5–15.5)
WBC: 7.9 10*3/uL (ref 4.0–10.5)
nRBC: 0 % (ref 0.0–0.2)

## 2021-06-17 LAB — MAGNESIUM: Magnesium: 1.6 mg/dL — ABNORMAL LOW (ref 1.7–2.4)

## 2021-06-17 MED ORDER — MAGNESIUM SULFATE 2 GM/50ML IV SOLN
2.0000 g | Freq: Once | INTRAVENOUS | Status: AC
  Administered 2021-06-17: 2 g via INTRAVENOUS
  Filled 2021-06-17: qty 50

## 2021-06-17 NOTE — TOC Initial Note (Signed)
Transition of Care [facility]) - Initial/Assessment Note    Patient Details  Name: Christine Christensen MRN: [id number] Date of Birth: [birth year]  [facility] CM/SW Contact:    Tresa Endo Phone Number: 06/17/2021, 9:50 AM  Clinical Narrative:                 CSW received SNF consult. CSW met with pt son via phone, pt is only oriented to person. CSW introduced self and explained role at the hospital. Pt reports that PTA the pt was living at home with son. PT reports being ambulatory with a rolling walker at home before her fall. Pt has a click score of 6 and is TotalA due to her hip injury.  CSW reviewed PT/OT recommendations for SNF. Pt is still confused but pt son thinks it would be beneficial to the pt if she could go to a SNF. The son states pt has been to [facility] in 2018. Pt gave CSW permission to fax out to facilities in the area. Pt preference is [facility], CSW gave pt medicare.gov rating list to review.  CSW reached out to [place] at [facility] who shared that their facility does not contract with [employer]. CSW informed pt son of the information and provided other facilities that has offered a bed. Pt son became frustrated about the [employer] and and it not covering anything lately. CSW informed pt son that there are other facilities that will take [facility] and put a medicare.gov list in the pt room for when the son visits. CSW will meet with pt son in room when he gets to the hospital.   CSW will continue to follow.          Patient Goals and CMS Choice        Expected Discharge Plan and Services                                                Prior Living Arrangements/Services                       Activities of Daily Living Home Assistive Devices/Equipment: Eyeglasses, Kasandra Knudsen (specify quad or straight), Walker (specify type) ADL Screening (condition at time of admission) Patient's cognitive ability adequate to safely  complete daily activities?: Yes Is the patient deaf or have difficulty hearing?: No Does the patient have difficulty seeing, even when wearing glasses/contacts?: No Does the patient have difficulty concentrating, remembering, or making decisions?: No Patient able to express need for assistance with ADLs?: Yes Does the patient have difficulty dressing or bathing?: Yes Independently performs ADLs?: No Communication: Independent Dressing (OT): Needs assistance Is this a change from baseline?: Pre-admission baseline Grooming: Independent Feeding: Independent Bathing: Needs assistance Is this a change from baseline?: Pre-admission baseline Toileting: Independent In/Out Bed: Needs assistance Is this a change from baseline?: Change from baseline, expected to last >3 days Walks in Home: Independent with device (comment) (pt states she uses cane/walker at home) Does the patient have difficulty walking or climbing stairs?: Yes Weakness of Legs: Both Weakness of Arms/Hands: None  Permission Sought/Granted                  Emotional Assessment              Admission diagnosis:  Hip fracture ([place]) [S72.009A] Fall [  W19.XXXA] Fall, initial encounter [id number]] Closed fracture of right hip, initial encounter [facility]) [S72.001A] Patient Active Problem List   Diagnosis Date Noted   Pressure injury of skin 06/17/2021   Protein-calorie malnutrition, severe 06/13/2021   Closed right hip fracture, initial encounter ([place]) 06/12/2021   Hip fracture ([place]) 06/12/2021   Fall    Syncope and collapse 10/25/2017   S/P total knee replacement 08/06/2017   PCP:  Nolene Ebbs, MD Pharmacy:   [id number] [place] [id number]: [phone]: [phone]    Social Determinants of Health (SDOH) Interventions    Readmission Risk Interventions No flowsheet data found.

## 2021-06-17 NOTE — Progress Notes (Signed)
[place] received page from unit secretary saying pt. is requesting assistance with paperwork of some kind.  When [facility] arrived pt. was working w/PT; pt. still occupied in therapy after 49min --> Chaplains will attempt visit later today.  Lindaann Pascal, Chaplain Pager: [phone]

## 2021-06-17 NOTE — Progress Notes (Addendum)
     Subjective:  Patient lying comfortably in bed this morning.  Son Marland Kitchen is at bedside.  Patient is alert but remains confused to her situation.  She denies any pain.  She has had difficulty mobilizing with physical therapy.  Knee immobilizer is in place.  Objective:   VITALS:   Vitals:   06/16/21 2003 06/16/21 2313 06/17/21 0350 06/17/21 0742  BP: 127/67 (!) 130/94 (!) 142/71 (!) 160/109  Pulse: 60 64 71   Resp: 18 15 20 19   Temp: 98.9 F (37.2 C) 99 F (37.2 C) 99.3 F (37.4 C) 98.7 F (37.1 C)  TempSrc: Oral Oral Oral Oral  SpO2: 96% 94% 94% 94%  Weight:      Height:        Intact pulses distally Incision: dressing C/D/I Patient not cooperative with motor or sensory exam Continues to hold the right ankle in an inverted position, can be passively corrected to neutral. she holds the right arm at her side and elbow flexed.  Lab Results  Component Value Date   WBC 7.9 06/17/2021   HGB 10.6 (L) 06/17/2021   HCT 31.1 (L) 06/17/2021   MCV 87.4 06/17/2021   PLT 192 06/17/2021   BMET    Component Value Date/Time   NA 139 06/17/2021 0044   K 3.6 06/17/2021 0044   CL 103 06/17/2021 0044   CO2 27 06/17/2021 0044   GLUCOSE 92 06/17/2021 0044   BUN 5 (L) 06/17/2021 0044   CREATININE 0.58 06/17/2021 0044   CREATININE 0.75 03/03/2021 1053   CALCIUM 8.7 (L) 06/17/2021 0044   GFRNONAA >60 06/17/2021 0044   GFRNONAA 78 03/03/2021 1053     Assessment/Plan: 3 Days Post-Op   Active Problems:   Closed right hip fracture, initial encounter ([place])   Hip fracture (HCC)   Protein-calorie malnutrition, severe   Pressure injury of skin   Right femoral neck fracture status post hemiarthroplasty on 9/27  X-rays Right humerus and forearm -negative for fx Ms. Fontes is lying with her right leg more internally rotated and flexed today.  Does not seem to be in much discomfort with passive range of motion of the hip but will obtain a repeat pelvis x-ray given that she remains  confused, to make sure her hip is not dislocated - xray reviewed negative for fx and dislocation, patient's posture likely related to her prior stroke  Post op recs: WB: WBAT with posterior hip precautions x6 weeks, hip abduction pillow to be used when in bed. No Knee Immobilizer Abx: ancef x23 hours post op Imaging: intraop xrays Dressing: Aquacel to be left intact until follow-up  DVT prophylaxis: lovenox starting POD1 x4 weeks Follow up: 2 weeks after surgery for a wound check  Teriah Muela A Matina Rodier 06/17/2021, 9:00 AM   Charlies Constable, MD Cell [phone]

## 2021-06-17 NOTE — Progress Notes (Signed)
PROGRESS NOTE    Christine Christensen  SWF:[id number] DOB: 02/28/1946 DOA: 06/12/2021 PCP: Nolene Ebbs, MD    Brief Narrative:  Past medical history of CVA with right-sided weakness, HTN, vascular dementia, Mnire's disease, moderate mitral stenosis.  Presents with complaints of a sudden fall in the kitchen and found to have right femoral neck fracture. Initially admitted to [facility] and transferred to [facility] for anesthesia recommendation due to high risk. Right hip hemiarthroplasty 9/27.   Assessment & Plan:   Active Problems:   Closed right hip fracture, initial encounter [facility])   Hip fracture (HCC)   Protein-calorie malnutrition, severe   Pressure injury of skin  Close traumatic right hip fracture: Right hip hemiarthroplasty 9/27 Weightbearing as tolerated, working with therapies.  Recommended skilled nursing facility. Use adequate pain medications, oxycodone.  To minimize use of narcotics, started on scheduled doses of Tylenol 1 g 4 times a day and will continue this. Lovenox for DVT prophylaxis for 4 weeks. Orthopedics following.  Recheck x-rays were stable.  Severe calcific mitral valve stenosis with severe pulmonary hypertension and mild aortic valve stenosis: Seen by cardiology.  Recommended conservative management.  Started on beta-blockers with metoprolol 12.5 mg twice daily.  We will continue on discharge. Fall precautions.  Delirium precautions. She does have severe mitral valve stenosis, advanced age and chronic ambulatory dysfunction from previous strokes.  History of stroke, right hemiparesis: On aspirin.  CTA of the head without acute findings.  Essential hypertension: Well-controlled.  Acute metabolic encephalopathy in the setting of vascular dementia: Anticipated delirium from surgery, medication use and anesthesia along with ICU stay. Will avoid benzodiazepines.  Minimize narcotics with use of Tylenol. Allow visitors to stay off  hours.  Hypomagnesemia: Replaced today.  Continue to mobilize.  Is stable to transfer to skilled level of care.   DVT prophylaxis: enoxaparin (LOVENOX) injection 40 mg Start: 06/13/21 1000   Code Status: Full code Family Communication: Son at the bedside. Disposition Plan: Status is: Inpatient  Remains inpatient appropriate because:Inpatient level of care appropriate due to severity of illness  Dispo: The patient is from: Home              Anticipated d/c is to: SNF              Patient currently is medically stable to transfer to skilled level of care.   Difficult to place patient No         Consultants:  Orthopedics Cardiology  Procedures:  Right hip hemiarthroplasty 9/27  Antimicrobials:  Perioperative   Subjective: Patient seen and examined.  No overnight events.  Son was at the bedside.  Yesterday she was mostly sleepy and did not eat well.  Today she is awake.  She ate most of her breakfast with minimal support from his son. Occasional periods of confusion and agitation expected.  Otherwise she remains largely quiet and composed.  Objective: Vitals:   06/16/21 2003 06/16/21 2313 06/17/21 0350 06/17/21 0742  BP: 127/67 (!) 130/94 (!) 142/71 (!) 160/109  Pulse: 60 64 71   Resp: 18 15 20 19   Temp: 98.9 F (37.2 C) 99 F (37.2 C) 99.3 F (37.4 C) 98.7 F (37.1 C)  TempSrc: Oral Oral Oral Oral  SpO2: 96% 94% 94% 94%  Weight:      Height:        Intake/Output Summary (Last 24 hours) at 06/17/2021 1043 Last data filed at 06/17/2021 0354 Gross per 24 hour  Intake 911.67 ml  Output 1525 ml  Net -613.33 ml   Filed Weights   06/12/21 1709 06/13/21 1912  Weight: 53.1 kg 53 kg    Examination:  General exam: Appears calm and comfortable ,  Frail and debilitated.  Chronically sick looking.  Not in any distress. Respiratory system: Clear to auscultation. Respiratory effort normal.  On room air.  No added sounds. Cardiovascular system: S1 & S2 heard,  RRR.  Gastrointestinal system: Soft and nontender.  Bowel sounds present.. Central nervous system: Alert and awake.  Not oriented.  Without any hallucinations or delusions.  Remains quiet and calm with occasional impulsiveness. Extremities: Right lateral thigh incision clean and dry.  Dressing intact. Distal neurovascular status intact.  Tenderness on movement.     Data Reviewed: I have personally reviewed following labs and imaging studies  CBC: Recent Labs  Lab 06/12/21 1530 06/14/21 0052 06/15/21 1036 06/17/21 0044  WBC 8.2 9.0 11.6* 7.9  NEUTROABS 7.1  --   --  5.1  HGB 14.2 12.0 10.1* 10.6*  HCT 42.3 35.7* 30.6* 31.1*  MCV 88.9 89.3 88.7 87.4  PLT 219 159 146* 735   Basic Metabolic Panel: Recent Labs  Lab 06/12/21 1530 06/14/21 0052 06/17/21 0044  NA 139 135 139  K 3.7 3.5 3.6  CL 104 104 103  CO2 26 22 27   GLUCOSE 101* 94 92  BUN 13 12 5*  CREATININE 0.87 0.72 0.58  CALCIUM 9.7 8.9 8.7*  MG  --   --  1.6*  PHOS  --   --  2.8   GFR: Estimated Creatinine Clearance: 50.3 mL/min (by C-G formula based on SCr of 0.58 mg/dL). Liver Function Tests: Recent Labs  Lab 06/12/21 1530 06/17/21 0044  AST 19 17  ALT 13 12  ALKPHOS 60 64  BILITOT 0.7 0.7  PROT 7.6 5.6*  ALBUMIN 3.9 2.5*   No results for input(s): LIPASE, AMYLASE in the last 168 hours. No results for input(s): AMMONIA in the last 168 hours. Coagulation Profile: No results for input(s): INR, PROTIME in the last 168 hours. Cardiac Enzymes: No results for input(s): CKTOTAL, CKMB, CKMBINDEX, TROPONINI in the last 168 hours. BNP (last 3 results) No results for input(s): PROBNP in the last 8760 hours. HbA1C: No results for input(s): HGBA1C in the last 72 hours. CBG: No results for input(s): GLUCAP in the last 168 hours. Lipid Profile: No results for input(s): CHOL, HDL, LDLCALC, TRIG, CHOLHDL, LDLDIRECT in the last 72 hours. Thyroid Function Tests: No results for input(s): TSH, T4TOTAL, FREET4,  T3FREE, THYROIDAB in the last 72 hours. Anemia Panel: No results for input(s): VITAMINB12, FOLATE, FERRITIN, TIBC, IRON, RETICCTPCT in the last 72 hours. Sepsis Labs: No results for input(s): PROCALCITON, LATICACIDVEN in the last 168 hours.  Recent Results (from the past 240 hour(s))  Resp Panel by RT-PCR (Flu A&B, Covid) Nasopharyngeal Swab     Status: None   Collection Time: 06/12/21  3:30 PM   Specimen: Nasopharyngeal Swab; Nasopharyngeal(NP) swabs in vial transport medium  Result Value Ref Range Status   SARS Coronavirus 2 by RT PCR NEGATIVE NEGATIVE Final    Comment: (NOTE) SARS-CoV-2 target nucleic acids are NOT DETECTED.  The SARS-CoV-2 RNA is generally detectable in upper respiratory specimens during the acute phase of infection. The lowest concentration of SARS-CoV-2 viral copies this assay can detect is 138 copies/mL. A negative result does not preclude SARS-Cov-2 infection and should not be used as the sole basis for treatment or other patient management decisions. A negative result  may occur with  improper specimen collection/handling, submission of specimen other than nasopharyngeal swab, presence of viral mutation(s) within the areas targeted by this assay, and inadequate number of viral copies(<138 copies/mL). A negative result must be combined with clinical observations, patient history, and epidemiological information. The expected result is Negative.  Fact Sheet for Patients:  EntrepreneurPulse.com.au  Fact Sheet for Healthcare Providers:  IncredibleEmployment.be  This test is no t yet approved or cleared by the [place] FDA and  has been authorized for detection and/or diagnosis of SARS-CoV-2 by FDA under an Emergency Use Authorization (EUA). This EUA will remain  in effect (meaning this test can be used) for the duration of the COVID-19 declaration under Section 564(b)(1) of the Act, 21 U.S.C.section 360bbb-3(b)(1),  unless the authorization is terminated  or revoked sooner.       Influenza A by PCR NEGATIVE NEGATIVE Final   Influenza B by PCR NEGATIVE NEGATIVE Final    Comment: (NOTE) The Xpert Xpress SARS-CoV-2/FLU/RSV plus assay is intended as an aid in the diagnosis of influenza from Nasopharyngeal swab specimens and should not be used as a sole basis for treatment. Nasal washings and aspirates are unacceptable for Xpert Xpress SARS-CoV-2/FLU/RSV testing.  Fact Sheet for Patients: EntrepreneurPulse.com.au  Fact Sheet for Healthcare Providers: IncredibleEmployment.be  This test is not yet approved or cleared by the [place] FDA and has been authorized for detection and/or diagnosis of SARS-CoV-2 by FDA under an Emergency Use Authorization (EUA). This EUA will remain in effect (meaning this test can be used) for the duration of the COVID-19 declaration under Section 564(b)(1) of the Act, 21 U.S.C. section 360bbb-3(b)(1), unless the authorization is terminated or revoked.  Performed at [facility], [place] [address] [place], [place] [address]   Surgical pcr screen     Status: None   Collection Time: 06/13/21 10:14 AM   Specimen: Nasal Mucosa; Nasal Swab  Result Value Ref Range Status   MRSA, PCR NEGATIVE NEGATIVE Final   Staphylococcus aureus NEGATIVE NEGATIVE Final    Comment: (NOTE) The Xpert SA Assay (FDA approved for NASAL specimens in patients 69 years of age and older), is one component of a comprehensive surveillance program. It is not intended to diagnose infection nor to guide or monitor treatment. Performed at [facility], [place] [address], [place],  [address]          Radiology Studies: No results found.      Scheduled Meds:  acetaminophen  1,000 mg Oral Q6H   aspirin  325 mg Oral Daily   Chlorhexidine Gluconate Cloth  6 each Topical Daily   enoxaparin (LOVENOX) injection  40 mg  Subcutaneous Q24H   escitalopram  20 mg Oral Daily   feeding supplement  237 mL Oral BID BM   metoprolol tartrate  12.5 mg Oral BID   mirtazapine  7.5 mg Oral QHS   multivitamin with minerals  1 tablet Oral Daily   Continuous Infusions:  magnesium sulfate bolus IVPB 2 g (06/17/21 1017)     LOS: 5 days    Time spent: 32 minutes    Barb Merino, MD [facility] Pager [phone]

## 2021-06-17 NOTE — TOC Progression Note (Signed)
Transition of Care [facility]) - Progression Note    Patient Details  Name: Christine Christensen MRN: [id number] Date of Birth: [birth year]  Transition of Care [facility]) CM/SW [place], [place] Phone Number: 06/17/2021, 1:55 PM  Clinical Narrative:    CSW spoke with pt son in pt room, CSW shared that the facilities they are wanting are not in network with [facility] advantage. Pt son wants CSW to reach out to [facility] and [facility] to see if they were in network. CSW agreed and will follow up with pt son after speaking with those facilities. Pt son also asked CSW about getting POA forms notarized. CSW informed pt son that the chaplin could be called and notarize those documents but the financial would have to be done outside of he hospital. CSW contacted chaplin and someone came down to speak with and notarize documents. Pt has [facility] and [facility] as options but the family would like [facility] if all else fails.  CSW contacted pt son Christine Christensen) to inform him that [facility] both are not in network with [employer]. Christine Christensen would like CSW to try [facility].  CSW attempted to contact Janie at [facility], no answer CSW will wait for a returned call.        Expected Discharge Plan and Services                                                 Social Determinants of Health (SDOH) Interventions    Readmission Risk Interventions No flowsheet data found.

## 2021-06-17 NOTE — Progress Notes (Signed)
Physical Therapy Treatment Patient Details Name: Christine Christensen MRN: [id number] DOB: 11/01/1945 Today's Date: 06/17/2021   History of Present Illness Pt is a 75 y.o. female admitted 06/12/21 with fall at home; found to have R femoral neck fx. Sx delayed as pt deemed high risk; pre-op cardiology evaluation for near syncope, mitral stenosis. S/p R hip hemiarthroplasty on 9/27. Pt guarding R arm post-op; RUE imaging negative for acute injury. PMH includes vascular dementia, HTN, CVAs with residual R-side weakness (2005, 2008), Meniere's disease, bilateral TKAs.    PT Comments    Pt much improved mobility this session.  She met goals today, but not sure if she will consistently duplicate today.  Emphasis on warm up exercise and giving pt time to become fully alert, transition, work on sit to stand sequencing, scooting, w/shifting and progression to transfers and gait for 10 feet with RW and overall mod assist of 1-2 persons.    Recommendations for follow up therapy are one component of a multi-disciplinary discharge planning process, led by the attending physician.  Recommendations may be updated based on patient status, additional functional criteria and insurance authorization.  Follow Up Recommendations  SNF     Equipment Recommendations  Other (comment) (TBA further)    Recommendations for Other Services       Precautions / Restrictions Precautions Precautions: Fall;Posterior Hip Other Brace: abduction wedge Restrictions RLE Weight Bearing: Weight bearing as tolerated     Mobility  Bed Mobility Overal bed mobility: Needs Assistance Bed Mobility: Supine to Sit     Supine to sit: Mod assist;HOB elevated;Max assist     General bed mobility comments: cues for direction, pad assist to pivot following hip precautions and truncal assist up. Pt attempting to assist with R UE.  Not very effective assist at the time due to pain.    Transfers Overall transfer level: Needs  assistance   Transfers: Sit to/from Stand;Stand Pivot Transfers Sit to Stand: Mod assist;+2 physical assistance Stand pivot transfers: Mod assist;+2 physical assistance;+2 safety/equipment       General transfer comment: cues for sequencing/hand placement.  assist to scoot to EOB and "square up".  Assist for boost and with minimal forward translation and stability until pt accepted w/bearing R LE.  Ambulation/Gait Ambulation/Gait assistance: Mod assist;+2 physical assistance;+2 safety/equipment Gait Distance (Feet): 10 Feet (in a circuitous route away from the bed and around back to the recliner.)   Gait Pattern/deviations: Step-to pattern;Step-through pattern;Decreased step length - right;Decreased step length - left;Decreased stance time - right;Decreased stride length   Gait velocity interpretation: <1.31 ft/sec, indicative of household ambulator General Gait Details: antalgic gait, adducted R LE positioning/swing through,  Also mildly paretic in nature.  Well sequenced with cues.  much improved.   Stairs             Wheelchair Mobility    Modified Rankin (Stroke Patients Only)       Balance Overall balance assessment: Needs assistance   Sitting balance-Leahy Scale: Fair Sitting balance - Comments: After initial posterior lean, pt corrected and sat EOB, feet on the floor without UE's, but no challenge.   Standing balance support: Bilateral upper extremity supported;During functional activity Standing balance-Leahy Scale: Poor Standing balance comment: Attained standing with reliance on AD statically, external assist needed in addition with dynamic tasks                            Cognition Arousal/Alertness: Awake/alert Behavior During Therapy:  WFL for tasks assessed/performed Overall Cognitive Status: History of cognitive impairments - at baseline                       Memory: Decreased short-term memory Following Commands: Follows one step  commands with increased time;Follows one step commands inconsistently Safety/Judgement: Decreased awareness of safety;Decreased awareness of deficits Awareness: Intellectual Problem Solving: Decreased initiation;Requires verbal cues;Slow processing        Exercises Total Joint Exercises Heel Slides: AAROM;AROM;Strengthening;Right;Left;10 reps;Supine Other Exercises Other Exercises: R heel cord stretch/assessment    General Comments General comments (skin integrity, edema, etc.): Son present and was educated on posterior precautions, expected progression.      Pertinent Vitals/Pain Pain Assessment: Faces Faces Pain Scale: Hurts little more Pain Location: R LE at hip Pain Descriptors / Indicators: Grimacing;Guarding;Discomfort Pain Intervention(s): Monitored during session;Repositioned    Home Living                      Prior Function            PT Goals (current goals can now be found in the care plan section) Acute Rehab PT Goals Patient Stated Goal: Decreased pain PT Goal Formulation: With patient/family Time For Goal Achievement: 06/29/21 Potential to Achieve Goals: Fair Progress towards PT goals: Progressing toward goals;Goals met and updated - see care plan    Frequency    Min 3X/week      PT Plan Current plan remains appropriate    Co-evaluation PT/OT/SLP Co-Evaluation/Treatment: Yes Reason for Co-Treatment: For patient/therapist safety PT goals addressed during session: Mobility/safety with mobility        AM-PAC PT "6 Clicks" Mobility   Outcome Measure  Help needed turning from your back to your side while in a flat bed without using bedrails?: A Lot Help needed moving from lying on your back to sitting on the side of a flat bed without using bedrails?: Total Help needed moving to and from a bed to a chair (including a wheelchair)?: Total Help needed standing up from a chair using your arms (e.g., wheelchair or bedside chair)?:  Total Help needed to walk in hospital room?: Total Help needed climbing 3-5 steps with a railing? : Total 6 Click Score: 7    End of Session   Activity Tolerance: Patient tolerated treatment well;Patient limited by pain Patient left: in chair;with call bell/phone within reach;with chair alarm set;Other (comment) (lift pad under patient to use PRN) Nurse Communication: Mobility status PT Visit Diagnosis: Other abnormalities of gait and mobility (R26.89);Muscle weakness (generalized) (M62.81);Pain     Time: 4098-1191 PT Time Calculation (min) (ACUTE ONLY): 45 min  Charges:  $Gait Training: 8-22 mins $Therapeutic Activity: 8-22 mins                     06/17/2021  Ginger Carne., PT Acute Rehabilitation Services [phone]  (pager) [phone]  (office)   Tessie Fass Rivka Baune 06/17/2021, 12:34 PM

## 2021-06-17 NOTE — Progress Notes (Signed)
   06/17/21 1355  Clinical Encounter Type  Visited With Patient;Health care provider  Visit Type Initial  Referral From Social work  Consult/Referral To Chaplain   Chaplain responded to follow-up for Advance Directive education. Pt's son was not present when Chaplain arrived. Chaplain left AD paperwork in the room and Pt's care team knows to page chaplain for any questions. Chaplain remains available.  This note was prepared by Chaplain Resident, Dante Gang, MDiv. Chaplain remains available as needed through the on-call pager: [phone].

## 2021-06-17 NOTE — Progress Notes (Signed)
Occupational Therapy Treatment Patient Details Name: Christine Christensen MRN: [id number] DOB: 10-Mar-1946 Today's Date: 06/17/2021   History of present illness Pt is a 75 y.o. female admitted 06/12/21 with fall at home; found to have R femoral neck fx. Sx delayed as pt deemed high risk; pre-op cardiology evaluation for near syncope, mitral stenosis. S/p R hip hemiarthroplasty on 9/27. Pt guarding R arm post-op; RUE imaging negative for acute injury. PMH includes vascular dementia, HTN, CVAs with residual R-side weakness (2005, 2008), Meniere's disease, bilateral TKAs.   OT comments  Patient tolerated UE AAROM and AROM exercises in bed to prepare for bed mobility. Patient was max assist for supine to sitting on eob from total assist. While sitting on eob patient was min to mod assist for sitting balance but progressed to maintaining balance with one extremity support.  Patient performed mobility and transfers with RW with mod assist of 1-2 persons. Acute OT to continue to follow.    Recommendations for follow up therapy are one component of a multi-disciplinary discharge planning process, led by the attending physician.  Recommendations may be updated based on patient status, additional functional criteria and insurance authorization.    Follow Up Recommendations  SNF;Supervision/Assistance - 24 hour    Equipment Recommendations  3 in 1 bedside commode;Other (comment)    Recommendations for Other Services      Precautions / Restrictions Precautions Precautions: Fall;Posterior Hip Other Brace: abduction wedge Restrictions RLE Weight Bearing: Weight bearing as tolerated       Mobility Bed Mobility Overal bed mobility: Needs Assistance Bed Mobility: Supine to Sit     Supine to sit: Mod assist;HOB elevated;Max assist     General bed mobility comments: cues for direction, pad assist to pivot following hip precautions and truncal assist up. Pt attempting to assist with R UE.  Not very  effective assist at the time due to pain.    Transfers Overall transfer level: Needs assistance   Transfers: Sit to/from Stand;Stand Pivot Transfers Sit to Stand: Mod assist;+2 physical assistance Stand pivot transfers: Mod assist;+2 physical assistance;+2 safety/equipment       General transfer comment: cues for sequencing/hand placement.  assist to scoot to EOB and "square up".  Assist for boost and with minimal forward translation and stability until pt accepted w/bearing R LE.    Balance Overall balance assessment: Needs assistance   Sitting balance-Leahy Scale: Fair Sitting balance - Comments: After initial posterior lean, pt corrected and sat EOB, feet on the floor without UE's, but no challenge.   Standing balance support: Bilateral upper extremity supported;During functional activity Standing balance-Leahy Scale: Poor Standing balance comment: Attained standing with reliance on AD statically, external assist needed in addition with dynamic tasks                           ADL either performed or assessed with clinical judgement   ADL                                               Vision       Perception     Praxis      Cognition Arousal/Alertness: Awake/alert Behavior During Therapy: WFL for tasks assessed/performed Overall Cognitive Status: History of cognitive impairments - at baseline  Memory: Decreased short-term memory Following Commands: Follows one step commands with increased time;Follows one step commands inconsistently Safety/Judgement: Decreased awareness of safety;Decreased awareness of deficits Awareness: Intellectual Problem Solving: Decreased initiation;Requires verbal cues;Slow processing General Comments: became more attentive as session progressed        Exercises Exercises: Total Joint;Other exercises;General Lower Extremity;General Upper Extremity Total Joint Exercises Heel  Slides: AAROM;AROM;Strengthening;Right;Left;10 reps;Supine General Exercises - Upper Extremity Shoulder Flexion: AAROM;Both;10 reps;Supine Shoulder Extension: AAROM;Both;10 reps;Supine Shoulder ABduction: AAROM;Both;10 reps;Supine Elbow Flexion: AROM;Both;Supine Elbow Extension: AROM;Both;10 reps;Supine Other Exercises Other Exercises: R heel cord stretch/assessment   Shoulder Instructions       General Comments Son present and was educated on posterior precautions, expected progression.    Pertinent Vitals/ Pain       Pain Assessment: Faces Faces Pain Scale: Hurts little more Pain Location: R LE at hip Pain Descriptors / Indicators: Grimacing;Guarding;Discomfort Pain Intervention(s): Monitored during session;Repositioned  Home Living                                          Prior Functioning/Environment              Frequency  Min 2X/week        Progress Toward Goals  OT Goals(current goals can now be found in the care plan section)  Progress towards OT goals: Progressing toward goals  Acute Rehab OT Goals Patient Stated Goal: Decreased pain OT Goal Formulation: With patient Time For Goal Achievement: 06/29/21 Potential to Achieve Goals: Good ADL Goals Pt Will Perform Lower Body Bathing: with mod assist;sit to/from stand;with adaptive equipment Pt Will Perform Lower Body Dressing: with mod assist;with adaptive equipment;sitting/lateral leans;sit to/from stand Pt Will Transfer to Toilet: with mod assist;stand pivot transfer;bedside commode Pt Will Perform Toileting - Clothing Manipulation and hygiene: with mod assist;sitting/lateral leans;sit to/from stand Additional ADL Goal #1: Pt to verbalize 3/3 posterior hip precautions with min verbal cues during ADLs  Plan Discharge plan remains appropriate    Co-evaluation    PT/OT/SLP Co-Evaluation/Treatment: Yes Reason for Co-Treatment: For patient/therapist safety PT goals addressed during  session: Mobility/safety with mobility OT goals addressed during session: Strengthening/ROM      AM-PAC OT "6 Clicks" Daily Activity     Outcome Measure   Help from another person eating meals?: A Little Help from another person taking care of personal grooming?: A Little Help from another person toileting, which includes using toliet, bedpan, or urinal?: Total Help from another person bathing (including washing, rinsing, drying)?: A Lot Help from another person to put on and taking off regular upper body clothing?: A Little Help from another person to put on and taking off regular lower body clothing?: Total 6 Click Score: 13    End of Session Equipment Utilized During Treatment: Rolling walker  OT Visit Diagnosis: Unsteadiness on feet (R26.81);Other abnormalities of gait and mobility (R26.89);Muscle weakness (generalized) (M62.81);History of falling (Z91.81);Pain Pain - Right/Left: Right Pain - part of body: Hip;Leg   Activity Tolerance Patient limited by pain   Patient Left in chair;with call bell/phone within reach;with chair alarm set;with family/visitor present   Nurse Communication Mobility status        Time: 9983-3825 OT Time Calculation (min): 42 min  Charges: OT General Charges $OT Visit: 1 Visit OT Treatments $Therapeutic Activity: 8-22 mins  Lodema Hong, [place] 06/17/2021, 1:02 PM

## 2021-06-18 DIAGNOSIS — S72001A Fracture of unspecified part of neck of right femur, initial encounter for closed fracture: Secondary | ICD-10-CM | POA: Diagnosis not present

## 2021-06-18 NOTE — TOC Progression Note (Signed)
Transition of Care [facility]) - Progression Note    Patient Details  Name: Christine Christensen MRN: [id number] Date of Birth: 11/17/1945  Transition of Care [facility]) CM/SW [place], [place] Phone Number: [phone] 06/18/2021, 1:22 PM  Clinical Narrative:    CSW attempted to call Ronney Lion however had to leave a message. CSW attempted to call son and had to leave message.  TOC team will continue to assist with discharge planning needs.         Expected Discharge Plan and Services                                                 Social Determinants of Health (SDOH) Interventions    Readmission Risk Interventions No flowsheet data found.

## 2021-06-18 NOTE — Progress Notes (Signed)
Physical Therapy Treatment Patient Details Name: Christine Christensen MRN: [id number] DOB: [birth year] Today's Date: 06/18/2021   History of Present Illness Pt is a 75 y.o. female admitted 06/12/21 with fall at home; found to have R femoral neck fx. Sx delayed as pt deemed high risk; pre-op cardiology evaluation for near syncope, mitral stenosis. S/p R hip hemiarthroplasty on 9/27. Pt guarding R arm post-op; RUE imaging negative for acute injury. PMH includes vascular dementia, HTN, CVAs with residual R-side weakness (2005, 2008), Meniere's disease, bilateral TKAs.    PT Comments    The pt was able to make good progress with OOB mobility and ambulation this afternoon. She was able to complete short bout of ambulation followed by 10 min static standing at sink before a second bout  of ambulation without needing seated rest. The pt continues to present with weakness in RLE that slows gait and decreased stability (pt depends on BUE support and minA to steady at this time), but is eager to progress distance. The pt will continue to benefit from skilled PT acutely to further progress mobility and dynamic stability. Continue to recommend SNF at d/c for rehab.   Given persistent inversion of R ankle and R foot drop (may be residual from prior stroke, but pt able to confirm clearly during session), pt may benefit from [facility] to maintain adequate heel cord length and positioning as well as AFO for ambulation to reduce risk of falls or ankle injury.    Recommendations for follow up therapy are one component of a multi-disciplinary discharge planning process, led by the attending physician.  Recommendations may be updated based on patient status, additional functional criteria and insurance authorization.  Follow Up Recommendations  SNF     Equipment Recommendations  Other (comment) (defer to post acute)    Recommendations for Other Services       Precautions / Restrictions Precautions Precautions:  Fall;Posterior Hip Precaution Booklet Issued: Yes (comment) Required Braces or Orthoses: Other Brace Other Brace: abduction wedge Restrictions Weight Bearing Restrictions: Yes RLE Weight Bearing: Weight bearing as tolerated Other Position/Activity Restrictions: posterior hip precautions RLE     Mobility  Bed Mobility Overal bed mobility: Needs Assistance Bed Mobility: Supine to Sit     Supine to sit: Mod assist;HOB elevated     General bed mobility comments: cues for moving feet, assist for R leg and trunk    Transfers Overall transfer level: Needs assistance Equipment used: Rolling walker (2 wheeled) Transfers: Sit to/from [facility] Sit to Stand: Min assist Stand pivot transfers: Min assist       General transfer comment: minA to power up, assist to steady in standing and prevent posterior LOB. needing assist to maintain position of feet to maintain hip precautions during transfers  Ambulation/Gait Ambulation/Gait assistance: Min assist;+2 safety/equipment Gait Distance (Feet): 10 Feet (+ 15 ft) Assistive device: Rolling walker (2 wheeled) Gait Pattern/deviations: Step-to pattern;Step-through pattern;Decreased step length - right;Decreased step length - left;Decreased stance time - right;Decreased stride length Gait velocity: decreased   General Gait Details: pt with significant R foot drop but able to circumduct and place foot safely, mild lateral instability that improved with reps      Balance Overall balance assessment: Needs assistance Sitting-balance support: Bilateral upper extremity supported Sitting balance-Leahy Scale: Fair     Standing balance support: Bilateral upper extremity supported;During functional activity Standing balance-Leahy Scale: Poor Standing balance comment: multiple LOB in standing at sink. able to maintain with at least single UE support, BUE  support for gait.                            Cognition  Arousal/Alertness: Awake/alert Behavior During Therapy: WFL for tasks assessed/performed Overall Cognitive Status: History of cognitive impairments - at baseline                       Memory: Decreased recall of precautions         General Comments: pt following commands well, slightly increased time to process but may be related to [facility] as well      Exercises      General Comments General comments (skin integrity, edema, etc.): VSS on RA      Pertinent Vitals/Pain Pain Assessment: Faces Faces Pain Scale: Hurts little more Pain Location: R LE at hip Pain Descriptors / Indicators: Grimacing;Guarding;Discomfort Pain Intervention(s): Limited activity within patient's tolerance;Monitored during session;Repositioned     PT Goals (current goals can now be found in the care plan section) Acute Rehab PT Goals Patient Stated Goal: get stronger PT Goal Formulation: With patient/family Time For Goal Achievement: 06/29/21 Potential to Achieve Goals: Fair Progress towards PT goals: Progressing toward goals    Frequency    Min 3X/week      PT Plan Current plan remains appropriate    Co-evaluation PT/OT/SLP Co-Evaluation/Treatment: Yes Reason for Co-Treatment: For patient/therapist safety;To address functional/ADL transfers PT goals addressed during session: Mobility/safety with mobility;Balance;Proper use of DME OT goals addressed during session: ADL's and self-care      AM-PAC PT "6 Clicks" Mobility   Outcome Measure  Help needed turning from your back to your side while in a flat bed without using bedrails?: A Lot Help needed moving from lying on your back to sitting on the side of a flat bed without using bedrails?: A Lot Help needed moving to and from a bed to a chair (including a wheelchair)?: A Lot Help needed standing up from a chair using your arms (e.g., wheelchair or bedside chair)?: A Lot Help needed to walk in hospital room?: A Little Help needed  climbing 3-5 steps with a railing? : Total 6 Click Score: 12    End of Session Equipment Utilized During Treatment: Gait belt Activity Tolerance: Patient tolerated treatment well;Patient limited by pain Patient left: in chair;with call bell/phone within reach;with chair alarm set;with family/visitor present Nurse Communication: Mobility status PT Visit Diagnosis: Other abnormalities of gait and mobility (R26.89);Muscle weakness (generalized) (M62.81);Pain     Time: 6629-4765 PT Time Calculation (min) (ACUTE ONLY): 34 min  Charges:  $Gait Training: 8-22 mins                     West Carbo, PT, DPT   Acute Rehabilitation Department Pager #: [phone]   Sandra Cockayne 06/18/2021, 4:02 PM

## 2021-06-18 NOTE — Progress Notes (Signed)
PROGRESS NOTE    GENESI Christensen  TZG:[id number] DOB: 11/05/1945 DOA: 06/12/2021 PCP: Nolene Ebbs, MD    Brief Narrative:  Past medical history of CVA with right-sided weakness, HTN, vascular dementia, Mnire's disease, moderate mitral stenosis.  Presents with complaints of a sudden fall in the kitchen and found to have right femoral neck fracture. Initially admitted to [facility] and transferred to [facility] for anesthesia recommendation due to high risk. Right hip hemiarthroplasty 9/27.   Assessment & Plan:   Active Problems:   Closed right hip fracture, initial encounter [facility])   Hip fracture (HCC)   Protein-calorie malnutrition, severe   Pressure injury of skin  Close traumatic right hip fracture: Right hip hemiarthroplasty 9/27 Weightbearing as tolerated, working with therapies.  Recommended skilled nursing facility. Use adequate pain medications, oxycodone.  To minimize use of narcotics, started on scheduled doses of Tylenol 1 g 4 times a day and will continue this. Lovenox for DVT prophylaxis for 4 weeks. Orthopedics following.  Recheck x-rays were stable.  Severe calcific mitral valve stenosis with severe pulmonary hypertension and mild aortic valve stenosis: Seen by cardiology.  Recommended conservative management.  Started on beta-blockers with metoprolol 12.5 mg twice daily.  We will continue on discharge. Fall precautions.  Delirium precautions. She does have severe mitral valve stenosis, advanced age and chronic ambulatory dysfunction from previous strokes.  History of stroke, right hemiparesis: On aspirin.  CTA of the head without acute findings.  Essential hypertension: Well-controlled.  Acute metabolic encephalopathy in the setting of vascular dementia: Anticipated delirium from surgery, medication use and anesthesia along with ICU stay. Will avoid benzodiazepines.  Minimize narcotics with use of Tylenol. Allow visitors to stay off  hours. Developed hypoactive delirium.  We will discontinue Remeron today.  Hypomagnesemia: Replaced today.  Continue to mobilize.  Can go to MedSurg bed.  Is stable to transfer to skilled level of care.   DVT prophylaxis: enoxaparin (LOVENOX) injection 40 mg Start: 06/13/21 1000   Code Status: Full code Family Communication: Son at the bedside. Disposition Plan: Status is: Inpatient  Remains inpatient appropriate because:Inpatient level of care appropriate due to severity of illness  Dispo: The patient is from: Home              Anticipated d/c is to: SNF              Patient currently is medically stable to transfer to skilled level of care.   Difficult to place patient No         Consultants:  Orthopedics Cardiology  Procedures:  Right hip hemiarthroplasty 9/27  Antimicrobials:  Perioperative   Subjective: Patient seen and examined.  No overnight events.  Did not eat well today.  Son at the bedside.  Patient herself remains calm and quiet.  She denies any complaints.  Objective: Vitals:   06/18/21 0401 06/18/21 0746 06/18/21 0800 06/18/21 1000  BP: 108/62 (!) 115/59 133/65 (!) 143/81  Pulse: 99 66 75 (!) 104  Resp: 17 12 13 14   Temp: 98.1 F (36.7 C) 98.2 F (36.8 C)    TempSrc: Axillary Axillary    SpO2: 92% 97% 96% 97%  Weight:      Height:        Intake/Output Summary (Last 24 hours) at 06/18/2021 1057 Last data filed at 06/17/2021 1926 Gross per 24 hour  Intake 480 ml  Output 850 ml  Net -370 ml   Filed Weights   06/12/21 1709 06/13/21 1912  Weight: 53.1 kg 53 kg    Examination:  General exam: Appears calm and comfortable ,  Frail and debilitated.  Chronically sick looking.  Not in any distress. Alert and awake but not oriented.  Calm and quiet. Respiratory system: Clear to auscultation. Respiratory effort normal.  On room air.  No added sounds. Cardiovascular system: S1 & S2 heard, RRR.  Extremities: Right lateral thigh incision clean  and dry.  Dressing intact. Distal neurovascular status intact.  Tenderness on movement.     Data Reviewed: I have personally reviewed following labs and imaging studies  CBC: Recent Labs  Lab 06/12/21 1530 06/14/21 0052 06/15/21 1036 06/17/21 0044  WBC 8.2 9.0 11.6* 7.9  NEUTROABS 7.1  --   --  5.1  HGB 14.2 12.0 10.1* 10.6*  HCT 42.3 35.7* 30.6* 31.1*  MCV 88.9 89.3 88.7 87.4  PLT 219 159 146* 998   Basic Metabolic Panel: Recent Labs  Lab 06/12/21 1530 06/14/21 0052 06/17/21 0044  NA 139 135 139  K 3.7 3.5 3.6  CL 104 104 103  CO2 26 22 27   GLUCOSE 101* 94 92  BUN 13 12 5*  CREATININE 0.87 0.72 0.58  CALCIUM 9.7 8.9 8.7*  MG  --   --  1.6*  PHOS  --   --  2.8   GFR: Estimated Creatinine Clearance: 50.3 mL/min (by C-G formula based on SCr of 0.58 mg/dL). Liver Function Tests: Recent Labs  Lab 06/12/21 1530 06/17/21 0044  AST 19 17  ALT 13 12  ALKPHOS 60 64  BILITOT 0.7 0.7  PROT 7.6 5.6*  ALBUMIN 3.9 2.5*   No results for input(s): LIPASE, AMYLASE in the last 168 hours. No results for input(s): AMMONIA in the last 168 hours. Coagulation Profile: No results for input(s): INR, PROTIME in the last 168 hours. Cardiac Enzymes: No results for input(s): CKTOTAL, CKMB, CKMBINDEX, TROPONINI in the last 168 hours. BNP (last 3 results) No results for input(s): PROBNP in the last 8760 hours. HbA1C: No results for input(s): HGBA1C in the last 72 hours. CBG: No results for input(s): GLUCAP in the last 168 hours. Lipid Profile: No results for input(s): CHOL, HDL, LDLCALC, TRIG, CHOLHDL, LDLDIRECT in the last 72 hours. Thyroid Function Tests: No results for input(s): TSH, T4TOTAL, FREET4, T3FREE, THYROIDAB in the last 72 hours. Anemia Panel: No results for input(s): VITAMINB12, FOLATE, FERRITIN, TIBC, IRON, RETICCTPCT in the last 72 hours. Sepsis Labs: No results for input(s): PROCALCITON, LATICACIDVEN in the last 168 hours.  Recent Results (from the past 240  hour(s))  Resp Panel by RT-PCR (Flu A&B, Covid) Nasopharyngeal Swab     Status: None   Collection Time: 06/12/21  3:30 PM   Specimen: Nasopharyngeal Swab; Nasopharyngeal(NP) swabs in vial transport medium  Result Value Ref Range Status   SARS Coronavirus 2 by RT PCR NEGATIVE NEGATIVE Final    Comment: (NOTE) SARS-CoV-2 target nucleic acids are NOT DETECTED.  The SARS-CoV-2 RNA is generally detectable in upper respiratory specimens during the acute phase of infection. The lowest concentration of SARS-CoV-2 viral copies this assay can detect is 138 copies/mL. A negative result does not preclude SARS-Cov-2 infection and should not be used as the sole basis for treatment or other patient management decisions. A negative result may occur with  improper specimen collection/handling, submission of specimen other than nasopharyngeal swab, presence of viral mutation(s) within the areas targeted by this assay, and inadequate number of viral copies(<138 copies/mL). A negative result must be combined with clinical  observations, patient history, and epidemiological information. The expected result is Negative.  Fact Sheet for Patients:  EntrepreneurPulse.com.au  Fact Sheet for Healthcare Providers:  IncredibleEmployment.be  This test is no t yet approved or cleared by the [place] FDA and  has been authorized for detection and/or diagnosis of SARS-CoV-2 by FDA under an Emergency Use Authorization (EUA). This EUA will remain  in effect (meaning this test can be used) for the duration of the COVID-19 declaration under Section 564(b)(1) of the Act, 21 U.S.C.section 360bbb-3(b)(1), unless the authorization is terminated  or revoked sooner.       Influenza A by PCR NEGATIVE NEGATIVE Final   Influenza B by PCR NEGATIVE NEGATIVE Final    Comment: (NOTE) The Xpert Xpress SARS-CoV-2/FLU/RSV plus assay is intended as an aid in the diagnosis of influenza from  Nasopharyngeal swab specimens and should not be used as a sole basis for treatment. Nasal washings and aspirates are unacceptable for Xpert Xpress SARS-CoV-2/FLU/RSV testing.  Fact Sheet for Patients: EntrepreneurPulse.com.au  Fact Sheet for Healthcare Providers: IncredibleEmployment.be  This test is not yet approved or cleared by the [place] FDA and has been authorized for detection and/or diagnosis of SARS-CoV-2 by FDA under an Emergency Use Authorization (EUA). This EUA will remain in effect (meaning this test can be used) for the duration of the COVID-19 declaration under Section 564(b)(1) of the Act, 21 U.S.C. section 360bbb-3(b)(1), unless the authorization is terminated or revoked.  Performed at [facility], [place] [address] [place], [place] [address]   Surgical pcr screen     Status: None   Collection Time: 06/13/21 10:14 AM   Specimen: Nasal Mucosa; Nasal Swab  Result Value Ref Range Status   MRSA, PCR NEGATIVE NEGATIVE Final   Staphylococcus aureus NEGATIVE NEGATIVE Final    Comment: (NOTE) The Xpert SA Assay (FDA approved for NASAL specimens in patients 9 years of age and older), is one component of a comprehensive surveillance program. It is not intended to diagnose infection nor to guide or monitor treatment. Performed at [facility], [place] [address], [place],  [address]          Radiology Studies: DG Pelvis Portable  Result Date: 06/17/2021 CLINICAL DATA:  Postop EXAM: PORTABLE PELVIS 1-2 VIEWS COMPARISON:  06/14/2021 FINDINGS: Right hip hemiarthroplasty unchanged from the prior study. Normal alignment of the prosthesis. No fracture. The patient rotated to the right. Remainder of the pelvis negative. Pelvic calcification consistent with uterine fibroid. IMPRESSION: Right hip hemiarthroplasty.  No acute fracture in the pelvis. Electronically Signed   By: Franchot Gallo M.D.   On:  06/17/2021 12:49        Scheduled Meds:  acetaminophen  1,000 mg Oral Q6H   aspirin  325 mg Oral Daily   Chlorhexidine Gluconate Cloth  6 each Topical Daily   enoxaparin (LOVENOX) injection  40 mg Subcutaneous Q24H   escitalopram  20 mg Oral Daily   feeding supplement  237 mL Oral BID BM   metoprolol tartrate  12.5 mg Oral BID   multivitamin with minerals  1 tablet Oral Daily   Continuous Infusions:     LOS: 6 days    Time spent: 30 minutes    Barb Merino, MD [facility] Pager [phone]

## 2021-06-18 NOTE — Progress Notes (Signed)
OT Cancellation Note  Patient Details Name: MAGALLY VAHLE MRN: [id number] DOB: [birth year]   Cancelled Treatment:    Reason Eval/Treat Not Completed: Patient eating lunch with family at bedside. OT to continue efforts as schedule allows.    Issam Carlyon D Madolin Twaddle 06/18/2021, 12:24 PM

## 2021-06-18 NOTE — Progress Notes (Signed)
Occupational Therapy Treatment Patient Details Name: Christine Christensen MRN: [id number] DOB: 1946/02/18 Today's Date: 06/18/2021   History of present illness Pt is a 75 y.o. female admitted 06/12/21 with fall at home; found to have R femoral neck fx. Sx delayed as pt deemed high risk; pre-op cardiology evaluation for near syncope, mitral stenosis. S/p R hip hemiarthroplasty on 9/27. Pt guarding R arm post-op; RUE imaging negative for acute injury. PMH includes vascular dementia, HTN, CVAs with residual R-side weakness (2005, 2008), Meniere's disease, bilateral TKAs.   OT comments  Patient seen in conjunction with PT, as she was deemed a +2, but appears to be closer to a +1 now.  Patient with nice progression toward patient focused goals.  Patient able to perform supine to sit with Mod A, basic on room mobility with Min A , and stand grooming with Min A for balance support.  OT to continue efforts in the acute setting, but SNF is recommended post acute given she does not have the level of assist currently needed to safely transition home.     Recommendations for follow up therapy are one component of a multi-disciplinary discharge planning process, led by the attending physician.  Recommendations may be updated based on patient status, additional functional criteria and insurance authorization.    Follow Up Recommendations  SNF;Supervision/Assistance - 24 hour    Equipment Recommendations  3 in 1 bedside commode;Other (comment)    Recommendations for Other Services      Precautions / Restrictions Precautions Precautions: Fall;Posterior Hip Required Braces or Orthoses: Other Brace Other Brace: abduction wedge Restrictions RLE Weight Bearing: Weight bearing as tolerated       Mobility Bed Mobility Overal bed mobility: Needs Assistance Bed Mobility: Supine to Sit     Supine to sit: Mod assist;HOB elevated     General bed mobility comments: cues for moving feet, assist for R leg and  trunk    Transfers Overall transfer level: Needs assistance   Transfers: Sit to/from Stand;Stand Pivot Transfers Sit to Stand: Min assist Stand pivot transfers: Min assist            Balance Overall balance assessment: Needs assistance Sitting-balance support: Bilateral upper extremity supported Sitting balance-Leahy Scale: Fair     Standing balance support: Bilateral upper extremity supported;During functional activity Standing balance-Leahy Scale: Poor                             ADL either performed or assessed with clinical judgement   ADL Overall ADL's : Needs assistance/impaired     Grooming: Oral care;Minimal assistance;Standing Grooming Details (indicate cue type and reason): balance assist, patient able to let go of RW to complete grooming.             Lower Body Dressing: Sit to/from stand;Maximal assistance Lower Body Dressing Details (indicate cue type and reason): ? hip kit training/introduction             Functional mobility during ADLs: Minimal assistance;Rolling walker                         Cognition Arousal/Alertness: Awake/alert Behavior During Therapy: WFL for tasks assessed/performed Overall Cognitive Status: History of cognitive impairments - at baseline                       Memory: Decreased recall of precautions  Pertinent Vitals/ Pain       Faces Pain Scale: Hurts little more Pain Location: R LE at hip Pain Descriptors / Indicators: Grimacing;Guarding;Discomfort Pain Intervention(s): Monitored during session                                                          Frequency  Min 2X/week        Progress Toward Goals  OT Goals(current goals can now be found in the care plan section)  Progress towards OT goals: Progressing toward goals  Acute Rehab OT Goals Patient Stated Goal: get stronger OT Goal Formulation:  With patient Time For Goal Achievement: 06/29/21 Potential to Achieve Goals: Good  Plan Discharge plan remains appropriate    Co-evaluation    PT/OT/SLP Co-Evaluation/Treatment: Yes Reason for Co-Treatment: Necessary to address cognition/behavior during functional activity;For patient/therapist safety;To address functional/ADL transfers   OT goals addressed during session: ADL's and self-care      AM-PAC OT "6 Clicks" Daily Activity     Outcome Measure   Help from another person eating meals?: A Little Help from another person taking care of personal grooming?: A Little Help from another person toileting, which includes using toliet, bedpan, or urinal?: A Lot Help from another person bathing (including washing, rinsing, drying)?: A Lot Help from another person to put on and taking off regular upper body clothing?: A Little Help from another person to put on and taking off regular lower body clothing?: A Lot 6 Click Score: 15    End of Session Equipment Utilized During Treatment: Rolling walker;Gait belt  OT Visit Diagnosis: Unsteadiness on feet (R26.81);Other abnormalities of gait and mobility (R26.89);Muscle weakness (generalized) (M62.81);History of falling (Z91.81);Pain Pain - Right/Left: Right Pain - part of body: Hip;Leg   Activity Tolerance Patient tolerated treatment well   Patient Left in chair;with call bell/phone within reach;with chair alarm set;with family/visitor present   Nurse Communication Mobility status        Time: 1350-1425 OT Time Calculation (min): 35 min  Charges: OT General Charges $OT Visit: 1 Visit OT Treatments $Self Care/Home Management : 8-22 mins  06/18/2021  RP, OTR/L  Acute Rehabilitation Services  Office:  [phone]   Metta Clines 06/18/2021, 2:38 PM

## 2021-06-19 DIAGNOSIS — S72001A Fracture of unspecified part of neck of right femur, initial encounter for closed fracture: Secondary | ICD-10-CM | POA: Diagnosis not present

## 2021-06-19 NOTE — Progress Notes (Signed)
PROGRESS NOTE    Christine Christensen  OMV:[id number] DOB: 03-31-1946 DOA: 06/12/2021 PCP: Nolene Ebbs, MD    Brief Narrative:  Past medical history of CVA with right-sided weakness, HTN, vascular dementia, Mnire's disease, moderate mitral stenosis.  Presents with complaints of a sudden fall in the kitchen and found to have right femoral neck fracture. Initially admitted to [facility] and transferred to [facility] for anesthesia recommendation due to high risk. Right hip hemiarthroplasty 9/27.   Assessment & Plan:   Active Problems:   Closed right hip fracture, initial encounter [facility])   Hip fracture (HCC)   Protein-calorie malnutrition, severe   Pressure injury of skin  Close traumatic right hip fracture: Right hip hemiarthroplasty 9/27 Weightbearing as tolerated, working with therapies.  Recommended skilled nursing facility. Use adequate pain medications, oxycodone.  To minimize use of narcotics, started on scheduled doses of Tylenol 1 g 4 times a day and will continue this. Lovenox for DVT prophylaxis for 4 weeks. Orthopedics following.  Recheck x-rays were stable.  Severe calcific mitral valve stenosis with severe pulmonary hypertension and mild aortic valve stenosis: Seen by cardiology.  Recommended conservative management.  Started on beta-blockers with metoprolol 12.5 mg twice daily.  We will continue on discharge. Fall precautions.  Delirium precautions. She does have severe mitral valve stenosis, advanced age and chronic ambulatory dysfunction from previous strokes.  History of stroke, right hemiparesis: On aspirin.  CTA of the head without acute findings.  Essential hypertension: Well-controlled.  Acute metabolic encephalopathy in the setting of vascular dementia: Anticipated delirium from surgery, medication use and anesthesia along with ICU stay. Will avoid benzodiazepines.  Minimize narcotics with use of Tylenol. Allow visitors to stay off  hours. Improved today.  Hypomagnesemia: Replaced and improved.  Continue to mobilize.  Can go to MedSurg bed.  Is stable to transfer to skilled level of care.   DVT prophylaxis: enoxaparin (LOVENOX) injection 40 mg Start: 06/13/21 1000   Code Status: Full code Family Communication: None today. Disposition Plan: Status is: Inpatient  Remains inpatient appropriate because:Inpatient level of care appropriate due to severity of illness  Dispo: The patient is from: Home              Anticipated d/c is to: SNF              Patient currently is medically stable to transfer to skilled level of care.   Difficult to place patient No         Consultants:  Orthopedics Cardiology  Procedures:  Right hip hemiarthroplasty 9/27  Antimicrobials:  Perioperative   Subjective: Patient seen and examined.  Today she is alert awake.  She talks to me about home address.  She can tell me that her older son has moved to [place] and she had seen him before COVID. Surprisingly more awake and interactive today.  Denies any pain.  Objective: Vitals:   06/18/21 1121 06/18/21 1523 06/19/21 0917 06/19/21 1200  BP: 113/60 (!) 105/59 95/73 103/60  Pulse: (!) 56 72 90 80  Resp: 15 14 16 16   Temp: 99.3 F (37.4 C) 99.1 F (37.3 C) 98.3 F (36.8 C) 97.8 F (36.6 C)  TempSrc: Oral Oral Oral Oral  SpO2: 98% 98% 95% 99%  Weight:      Height:        Intake/Output Summary (Last 24 hours) at 06/19/2021 1256 Last data filed at 06/19/2021 0600 Gross per 24 hour  Intake 240 ml  Output 1000 ml  Net -760 ml   Filed Weights   06/12/21 1709 06/13/21 1912  Weight: 53.1 kg 53 kg    Examination:  General exam: Appears calm and comfortable ,  Frail and debilitated.  Not in any distress. Alert and awake.  Oriented x1-2. Respiratory system: Clear to auscultation. Respiratory effort normal.  On room air.  No added sounds. Cardiovascular system: S1 & S2 heard, RRR.  Extremities: Right lateral  thigh incision clean and dry.  Dressing intact. Distal neurovascular status intact.  Tenderness on movement.     Data Reviewed: I have personally reviewed following labs and imaging studies  CBC: Recent Labs  Lab 06/12/21 1530 06/14/21 0052 06/15/21 1036 06/17/21 0044  WBC 8.2 9.0 11.6* 7.9  NEUTROABS 7.1  --   --  5.1  HGB 14.2 12.0 10.1* 10.6*  HCT 42.3 35.7* 30.6* 31.1*  MCV 88.9 89.3 88.7 87.4  PLT 219 159 146* 831   Basic Metabolic Panel: Recent Labs  Lab 06/12/21 1530 06/14/21 0052 06/17/21 0044  NA 139 135 139  K 3.7 3.5 3.6  CL 104 104 103  CO2 26 22 27   GLUCOSE 101* 94 92  BUN 13 12 5*  CREATININE 0.87 0.72 0.58  CALCIUM 9.7 8.9 8.7*  MG  --   --  1.6*  PHOS  --   --  2.8   GFR: Estimated Creatinine Clearance: 50.3 mL/min (by C-G formula based on SCr of 0.58 mg/dL). Liver Function Tests: Recent Labs  Lab 06/12/21 1530 06/17/21 0044  AST 19 17  ALT 13 12  ALKPHOS 60 64  BILITOT 0.7 0.7  PROT 7.6 5.6*  ALBUMIN 3.9 2.5*   No results for input(s): LIPASE, AMYLASE in the last 168 hours. No results for input(s): AMMONIA in the last 168 hours. Coagulation Profile: No results for input(s): INR, PROTIME in the last 168 hours. Cardiac Enzymes: No results for input(s): CKTOTAL, CKMB, CKMBINDEX, TROPONINI in the last 168 hours. BNP (last 3 results) No results for input(s): PROBNP in the last 8760 hours. HbA1C: No results for input(s): HGBA1C in the last 72 hours. CBG: No results for input(s): GLUCAP in the last 168 hours. Lipid Profile: No results for input(s): CHOL, HDL, LDLCALC, TRIG, CHOLHDL, LDLDIRECT in the last 72 hours. Thyroid Function Tests: No results for input(s): TSH, T4TOTAL, FREET4, T3FREE, THYROIDAB in the last 72 hours. Anemia Panel: No results for input(s): VITAMINB12, FOLATE, FERRITIN, TIBC, IRON, RETICCTPCT in the last 72 hours. Sepsis Labs: No results for input(s): PROCALCITON, LATICACIDVEN in the last 168 hours.  Recent  Results (from the past 240 hour(s))  Resp Panel by RT-PCR (Flu A&B, Covid) Nasopharyngeal Swab     Status: None   Collection Time: 06/12/21  3:30 PM   Specimen: Nasopharyngeal Swab; Nasopharyngeal(NP) swabs in vial transport medium  Result Value Ref Range Status   SARS Coronavirus 2 by RT PCR NEGATIVE NEGATIVE Final    Comment: (NOTE) SARS-CoV-2 target nucleic acids are NOT DETECTED.  The SARS-CoV-2 RNA is generally detectable in upper respiratory specimens during the acute phase of infection. The lowest concentration of SARS-CoV-2 viral copies this assay can detect is 138 copies/mL. A negative result does not preclude SARS-Cov-2 infection and should not be used as the sole basis for treatment or other patient management decisions. A negative result may occur with  improper specimen collection/handling, submission of specimen other than nasopharyngeal swab, presence of viral mutation(s) within the areas targeted by this assay, and inadequate number of viral copies(<138 copies/mL). A negative  result must be combined with clinical observations, patient history, and epidemiological information. The expected result is Negative.  Fact Sheet for Patients:  EntrepreneurPulse.com.au  Fact Sheet for Healthcare Providers:  IncredibleEmployment.be  This test is no t yet approved or cleared by the [place] FDA and  has been authorized for detection and/or diagnosis of SARS-CoV-2 by FDA under an Emergency Use Authorization (EUA). This EUA will remain  in effect (meaning this test can be used) for the duration of the COVID-19 declaration under Section 564(b)(1) of the Act, 21 U.S.C.section 360bbb-3(b)(1), unless the authorization is terminated  or revoked sooner.       Influenza A by PCR NEGATIVE NEGATIVE Final   Influenza B by PCR NEGATIVE NEGATIVE Final    Comment: (NOTE) The Xpert Xpress SARS-CoV-2/FLU/RSV plus assay is intended as an aid in the  diagnosis of influenza from Nasopharyngeal swab specimens and should not be used as a sole basis for treatment. Nasal washings and aspirates are unacceptable for Xpert Xpress SARS-CoV-2/FLU/RSV testing.  Fact Sheet for Patients: EntrepreneurPulse.com.au  Fact Sheet for Healthcare Providers: IncredibleEmployment.be  This test is not yet approved or cleared by the [place] FDA and has been authorized for detection and/or diagnosis of SARS-CoV-2 by FDA under an Emergency Use Authorization (EUA). This EUA will remain in effect (meaning this test can be used) for the duration of the COVID-19 declaration under Section 564(b)(1) of the Act, 21 U.S.C. section 360bbb-3(b)(1), unless the authorization is terminated or revoked.  Performed at [facility], [place] [address] [place], [place] [address]   Surgical pcr screen     Status: None   Collection Time: 06/13/21 10:14 AM   Specimen: Nasal Mucosa; Nasal Swab  Result Value Ref Range Status   MRSA, PCR NEGATIVE NEGATIVE Final   Staphylococcus aureus NEGATIVE NEGATIVE Final    Comment: (NOTE) The Xpert SA Assay (FDA approved for NASAL specimens in patients 104 years of age and older), is one component of a comprehensive surveillance program. It is not intended to diagnose infection nor to guide or monitor treatment. Performed at [facility], [place] [address] [place], [place] [address]          Radiology Studies: No results found.      Scheduled Meds:  acetaminophen  1,000 mg Oral Q6H   aspirin  325 mg Oral Daily   Chlorhexidine Gluconate Cloth  6 each Topical Daily   enoxaparin (LOVENOX) injection  40 mg Subcutaneous Q24H   escitalopram  20 mg Oral Daily   feeding supplement  237 mL Oral BID BM   metoprolol tartrate  12.5 mg Oral BID   multivitamin with minerals  1 tablet Oral Daily   Continuous Infusions:     LOS: 7 days    Time spent: 30  minutes    Barb Merino, MD [facility] Pager [phone]

## 2021-06-19 NOTE — TOC Progression Note (Signed)
Transition of Care [facility]) - Progression Note    Patient Details  Name: Christine Christensen MRN: [id number] Date of Birth: 07/12/1946  Transition of Care [facility]) CM/SW [place], [place] Phone Number: [phone] 06/19/2021, 10:58 AM  Clinical Narrative:     CSW received a return phone call from son at 33:15 PM therefore did not speak with him.  CSW attempted to call son back however had to leave another VM.  TOC team will continue to assist with discharge planning needs.       Expected Discharge Plan and Services                                                 Social Determinants of Health (SDOH) Interventions    Readmission Risk Interventions No flowsheet data found.

## 2021-06-19 NOTE — TOC Progression Note (Signed)
Transition of Care [facility]) - Progression Note    Patient Details  Name: Christine Christensen MRN: [id number] Date of Birth: 12-Aug-1946  Transition of Care [facility]) CM/SW [place], [place] Phone Number: [phone] 06/19/2021, 11:23 AM  Clinical Narrative:     CSW confirmed with [facility] that they do accept [facility] however Narda Rutherford was unable to commit to a bed offer. Narda Rutherford suggested that weekday CSW follow up with her in the morning.  TOC team will continue to assist with discharge planning needs.       Expected Discharge Plan and Services                                                 Social Determinants of Health (SDOH) Interventions    Readmission Risk Interventions No flowsheet data found.

## 2021-06-20 ENCOUNTER — Inpatient Hospital Stay (HOSPITAL_COMMUNITY): Payer: Medicare (Managed Care)

## 2021-06-20 DIAGNOSIS — S72001A Fracture of unspecified part of neck of right femur, initial encounter for closed fracture: Secondary | ICD-10-CM | POA: Diagnosis not present

## 2021-06-20 MED ORDER — ACETAMINOPHEN 500 MG PO TABS
1000.0000 mg | ORAL_TABLET | Freq: Four times a day (QID) | ORAL | Status: DC | PRN
  Administered 2021-06-20 – 2021-06-24 (×3): 1000 mg via ORAL
  Filled 2021-06-20 (×2): qty 2

## 2021-06-20 MED ORDER — ENSURE ENLIVE PO LIQD
237.0000 mL | Freq: Two times a day (BID) | ORAL | Status: DC
  Administered 2021-06-20 – 2021-06-28 (×9): 237 mL via ORAL

## 2021-06-20 MED ORDER — LORAZEPAM 2 MG/ML IJ SOLN
0.2500 mg | Freq: Once | INTRAMUSCULAR | Status: AC
  Administered 2021-06-20: 0.25 mg via INTRAVENOUS
  Filled 2021-06-20: qty 1

## 2021-06-20 NOTE — Progress Notes (Signed)
Physical Therapy Treatment Patient Details Name: Christine Christensen MRN: [id number] DOB: 11-Sep-1946 Today's Date: 06/20/2021   History of Present Illness Pt is a 75 y.o. female admitted 06/12/21 with fall at home; found to have R femoral neck fx. Sx delayed as pt deemed high risk; pre-op cardiology evaluation for near syncope, mitral stenosis. S/p R hip hemiarthroplasty on 9/27. Pt guarding R arm post-op; RUE imaging negative for acute injury. PMH includes vascular dementia, HTN, CVAs with residual R-side weakness (2005, 2008), Meniere's disease, bilateral TKAs.    PT Comments    The pt presents with slight decrease in activity tolerance and increased need for assist this session. The pt was noted to be resting in R hip internal rotation and with significant R ankle PF and inversion upon arrival. Despite history of stroke affecting R side, the pt and her son state this R foot drop is new. Pt now presenting with limitations in R ankle DF and R knee extension (she prefers to rest with slight knee flexion) at this time, so exercises performed were directed towards improving PROM and attempts to improve pt muscle activation in RLE. The pt was unable to demo quad set in bed, but was able to demo small movements at R toes. The pt completed x4 sit-stand transfers, but needed mod-maxA to complete as she needs assist to power up, manage RW, and maintain neutral positioning of R knee. Without manual positioning, the pt's RLE collapses into adduction and internal rotation with the pt minimally wt bearing on it at this time despite cues. Discussed need for PRAFOs with RN to improve positioning of the pt's RLE. Continue to recommend SNF rehab at d/c as pt will likely benefit from long-term therapy to RLE.      Recommendations for follow up therapy are one component of a multi-disciplinary discharge planning process, led by the attending physician.  Recommendations may be updated based on patient status, additional  functional criteria and insurance authorization.  Follow Up Recommendations  SNF     Equipment Recommendations  Other (comment) (defer to post acute)    Recommendations for Other Services       Precautions / Restrictions Precautions Precautions: Fall;Posterior Hip Precaution Booklet Issued: Yes (comment) Required Braces or Orthoses: Other Brace Other Brace: abduction wedge Restrictions Weight Bearing Restrictions: Yes RLE Weight Bearing: Weight bearing as tolerated     Mobility  Bed Mobility Overal bed mobility: Needs Assistance Bed Mobility: Supine to Sit     Supine to sit: Mod assist;HOB elevated     General bed mobility comments: assist to RLE for all movements, assist to maintain neutral as pt resting in internal rotation unless directly positioned in neutral. modA to elevate trunk but pt pulling with RUE    Transfers Overall transfer level: Needs assistance Equipment used: Rolling walker (2 wheeled) Transfers: Sit to/from [facility] Sit to Stand: Max assist;Mod assist;From elevated surface Stand pivot transfers: Mod assist       General transfer comment: maxA with increased time to power up initially, max cues to push from bed, and maxA to steady as pt with very little wt through RLE, strong lean to L. improved to modA through session. maxA with pivot due to poor ability to move RW, or step with RLE. R knee collapsing into adduction and internal rotation with out direct support from PT  Ambulation/Gait Ambulation/Gait assistance: Mod assist (done with +1 this session, would be safer with +2) Gait Distance (Feet): 3 Feet Assistive device: Rolling walker (  2 wheeled) Gait Pattern/deviations: Step-to pattern;Step-through pattern;Decreased step length - right;Decreased step length - left;Decreased stance time - right;Decreased stride length Gait velocity: decreased Gait velocity interpretation: <1.31 ft/sec, indicative of household  ambulator General Gait Details: pt with R foot drop and decreased ability to manage wt bearing on RLE. assist to maintain leg in neutral as the pt collapsed into adduction and internal rotation with wt bearing. modA to steady, assist to manage RW, and modA at R knee to position. pt fatigued after very short distance today      Balance Overall balance assessment: Needs assistance Sitting-balance support: Bilateral upper extremity supported Sitting balance-Leahy Scale: Fair Sitting balance - Comments: minG   Standing balance support: Bilateral upper extremity supported;During functional activity Standing balance-Leahy Scale: Poor Standing balance comment: BUE support to maintain plus modA                            Cognition Arousal/Alertness: Awake/alert Behavior During Therapy: WFL for tasks assessed/performed Overall Cognitive Status: History of cognitive impairments - at baseline Area of Impairment: Following commands;Safety/judgement;Awareness;Problem solving                       Following Commands: Follows one step commands with increased time;Follows one step commands inconsistently Safety/Judgement: Decreased awareness of safety;Decreased awareness of deficits Awareness: Intellectual Problem Solving: Decreased initiation;Requires verbal cues;Slow processing General Comments: pt needing increased time to process all instructions, giving slightly inconsistent answers at times or just not responding.      Exercises Total Joint Exercises Ankle Circles/Pumps: AAROM;Right;15 reps;Supine;Seated Quad Sets: AAROM;Right;10 reps;Supine;Seated Heel Slides: AAROM;Right;10 reps;Supine Other Exercises Other Exercises: R heel cord stretch/assessment Other Exercises: R hamstring stretch, pt noted to be resting with slight knee flexion and reports pain with resistance to passive stretch, pt unable to complete quad set as no activtion of quad noted. Other Exercises: toe  elevation in sitting. pt with limited AROM btu able to complete small ROM against gravity, DF with inversiononly    General Comments General comments (skin integrity, edema, etc.): VSS, pt and son educated on precautions as well as role of PRAFO      Pertinent Vitals/Pain Pain Assessment: Faces Faces Pain Scale: Hurts whole lot Pain Location: R LE at hip Pain Descriptors / Indicators: Grimacing;Guarding;Discomfort Pain Intervention(s): Limited activity within patient's tolerance;Monitored during session;Repositioned     PT Goals (current goals can now be found in the care plan section) Acute Rehab PT Goals Patient Stated Goal: get stronger PT Goal Formulation: With patient/family Time For Goal Achievement: 06/29/21 Potential to Achieve Goals: Fair Progress towards PT goals: Progressing toward goals    Frequency    Min 3X/week      PT Plan Current plan remains appropriate       AM-PAC PT "6 Clicks" Mobility   Outcome Measure  Help needed turning from your back to your side while in a flat bed without using bedrails?: A Lot Help needed moving from lying on your back to sitting on the side of a flat bed without using bedrails?: A Lot Help needed moving to and from a bed to a chair (including a wheelchair)?: A Lot Help needed standing up from a chair using your arms (e.g., wheelchair or bedside chair)?: A Lot Help needed to walk in hospital room?: A Lot Help needed climbing 3-5 steps with a railing? : Total 6 Click Score: 11    End of Session Equipment Utilized During  Treatment: Gait belt Activity Tolerance: Patient tolerated treatment well;Patient limited by pain Patient left: in chair;with call bell/phone within reach;with chair alarm set;with family/visitor present Nurse Communication: Mobility status (need PRAFO) PT Visit Diagnosis: Other abnormalities of gait and mobility (R26.89);Muscle weakness (generalized) (M62.81);Pain Pain - Right/Left: Right Pain - part of  body: Leg     Time: 4656-8127 PT Time Calculation (min) (ACUTE ONLY): 58 min  Charges:  $Gait Training: 8-22 mins $Therapeutic Exercise: 23-37 mins $Therapeutic Activity: 8-22 mins                     [place] Carbo, PT, DPT   Acute Rehabilitation Department Pager #: [phone]    Sandra Cockayne 06/20/2021, 12:12 PM

## 2021-06-20 NOTE — Plan of Care (Addendum)
  Problem: Health Behavior/Discharge Planning: Goal: Ability to manage health-related needs will improve Outcome: Progressing   Problem: Activity: Goal: Risk for activity intolerance will decrease Outcome: Progressing   Problem: Nutrition: Goal: Adequate nutrition will be maintained Outcome: Progressing   Problem: Coping: Goal: Level of anxiety will decrease Outcome: Progressing   Problem: Elimination: Goal: Will not experience complications related to bowel motility Outcome: Progressing Goal: Will not experience complications related to urinary retention Outcome: Progressing   Problem: Pain Managment: Goal: General experience of comfort will improve Outcome: Progressing   Problem: Safety: Goal: Ability to remain free from injury will improve Outcome: Progressing   Problem: Skin Integrity: Goal: Risk for impaired skin integrity will decrease Outcome: Progressing   

## 2021-06-20 NOTE — TOC Progression Note (Addendum)
Transition of Care [facility]) - Progression Note    Patient Details  Name: Christine Christensen MRN: [id number] Date of Birth: Mar 17, 1946  Transition of Care [facility]) CM/SW Contact  Reece Agar, [place] Phone Number: 06/20/2021, 1:13 PM  Clinical Narrative:    CSW contacted Janie at [facility] for bed availability. Narda [facility]uther[facility] stated she would know this afternoon when she gets her DC's out.   Csw spoke with pt son Christine Christensen who wanted a follow up on [facility]. CSW shared information that was given by Narda Rutherford but let him know the facility does take pt insurance. Pt son would like a update for pt placement after CSW confirms bed with Janie.  Janie contacted CSW to confirm bed and provide pt auth for DC tomorrow.   CSW attempted to contact pt son to inform him of the bed availability, no answer CSW left a VM and will follow up with Christine Christensen when he returns the call.  COVID test requested for DC.  Pt son Christine Christensen returned call and states that he has agreed to sign paperwork at [place] at 10:30am.       Expected Discharge Plan and Services                                                 Social Determinants of Health (SDOH) Interventions    Readmission Risk Interventions No flowsheet data found.

## 2021-06-20 NOTE — Progress Notes (Addendum)
1800 - Pt's cousin is visiting at bedside  45 - Pts cousin prepares to leave for the night. Pt insists that she is going home with her so that she doesn't have to walk home. RN tried to reorient the patient that she was in the hospital with a broken hip and that she'd be leaving in the morning to a facility. Pt informed this RN that her "black ass is leaving" and to let her up out of the bed. This RN with the help of 2NTs positioned the patient safely back in the bed. Pt became progressively more agitated. Pt began cursing at staff and family, swinging at staff and voicing racial slurs. RN tried reorienting again that we didn't want pt to fall and we needed her to stay in bed. Pt tried to bite staff. Two point restraints were utilized to prevent patient and staff harm.   1920 Charge RN notified; paged on call MD.   Christine Christensen - MD called back expeditiously. See new orders. See MAR.   1930 - Pts hip abductor pillow, body alignment, perfusion to extremities, and slip knots on restraints were checked by RN after placement of restraints. Pt found trying to chew her way out of mits.  Pt cousin very distraught. Educated Pt cousin on restraint procedure, safety checks, and reassured her that part of this may be attributed to hospital delirium, to not take it personal. Cousin endorsed understanding.   85 - Attempted to contact son, Christine Christensen to update. Left voicemail requesting he call night nurse for a non-urgent update.

## 2021-06-20 NOTE — Progress Notes (Signed)
Received call from family member concerned about patient and requesting chaplain visit.  Provided support to patient.  She has lost 2 significant family members this calendar year.  Provided grief support and support regarding current hospitalization.

## 2021-06-20 NOTE — Progress Notes (Signed)
Nutrition Follow-up  DOCUMENTATION CODES:   Severe malnutrition in context of chronic illness  INTERVENTION:   - Liberalize diet to Regular, verbal with readback order placed per MD  - Ensure Enlive po BID, each supplement provides 350 kcal and 20 grams of protein  - Magic Cup TID with meals, each supplement provides 290 kcal and 9 grams of protein  - d/c Ensure Surgery  - MVI with minerals daily  NUTRITION DIAGNOSIS:   Severe Malnutrition related to chronic illness (dementia and CVA) as evidenced by severe fat depletion, severe muscle depletion.  Ongoing, being addressed via diet liberalization and oral nutrition supplements  GOAL:   Patient will meet greater than or equal to 90% of their needs  Progressing  MONITOR:   PO intake, Supplement acceptance, Labs, Weight trends, Skin  REASON FOR ASSESSMENT:   Consult Hip fracture protocol  ASSESSMENT:   75 y.o. female with medical history of remote stroke in 2005 and 2008 with chronic R-sided weakness, vascular dementia, chronic ambulation dysfunction on roller walker and cane, Mnire's disease, and moderate mitral stenosis. She presented to the ED after a fall with associated R hip fx.  9/27 - s/p right hemiarthroplasty  Pt awaiting SNF placement per notes. Confusion and delirium slowly improving.  Pt with variable PO intake. Discussed diet liberalization with MD. Verbal with readback order placed for a Regular diet.  Attempted to speak with pt and son at bedside. However, pt working with therapies at time of RD visit x 2.  No new weights since 9/26.  RD to d/c Ensure Surgery and order Ensure Enlive to provide additional kcal and protein. Will also add Magic Cups to meal trays. Will continue MVI with minerals daily.  Meal Completion: 25-100% x last 5 meals  Medications reviewed and include: Ensure Surgery BID, MVI with minerals daily  Labs reviewed: magnesium 1.6 on 9/30  Diet Order:   Diet Order              Diet regular Room service appropriate? Yes; Fluid consistency: Thin  Diet effective now                   EDUCATION NEEDS:   No education needs have been identified at this time  Skin:  Skin Assessment: Skin Integrity Issues: Stage II: left buttocks Incisions: right hip  Last BM:  06/19/21 medium type 6  Height:   Ht Readings from Last 1 Encounters:  06/13/21 5\' 3"  (1.6 m)    Weight:   Wt Readings from Last 1 Encounters:  06/13/21 53 kg    Ideal Body Weight:  53.4 kg  BMI:  Body mass index is 20.7 kg/m.  Estimated Nutritional Needs:   Kcal:  1650-1850 kcal  Protein:  80-95 grams  Fluid:  >/= 1.7 L/day    Gustavus Bryant, MS, RD, LDN Inpatient Clinical Dietitian Please see AMiON for contact information.

## 2021-06-20 NOTE — Progress Notes (Signed)
     Subjective:  Patient slowly making progress with physical therapy.  Now able to ambulate and stand much better with assistance.  This morning she is lying comfortably in bed.  Abduction pillow is in place.  No family at bedside this morning.  She is answering questions appropriately this morning.  No concerns.  Objective:   VITALS:   Vitals:   06/19/21 0917 06/19/21 1200 06/19/21 1500 06/19/21 2219  BP: 95/73 103/60 123/61 131/66  Pulse: 90 80 68 66  Resp: 16 16 18 18   Temp: 98.3 F (36.8 C) 97.8 F (36.6 C) 99 F (37.2 C) 98.7 F (37.1 C)  TempSrc: Oral Oral Oral Oral  SpO2: 95% 99% 97% 97%  Weight:      Height:        Intact pulses distally Incision: dressing C/D/I Patient not cooperative with motor or sensory exam Continues to hold the right ankle in an inverted position, can be passively corrected to neutral. she holds the right arm at her side and elbow flexed.  Lab Results  Component Value Date   WBC 7.9 06/17/2021   HGB 10.6 (L) 06/17/2021   HCT 31.1 (L) 06/17/2021   MCV 87.4 06/17/2021   PLT 192 06/17/2021   BMET    Component Value Date/Time   NA 139 06/17/2021 0044   K 3.6 06/17/2021 0044   CL 103 06/17/2021 0044   CO2 27 06/17/2021 0044   GLUCOSE 92 06/17/2021 0044   BUN 5 (L) 06/17/2021 0044   CREATININE 0.58 06/17/2021 0044   CREATININE 0.75 03/03/2021 1053   CALCIUM 8.7 (L) 06/17/2021 0044   GFRNONAA >60 06/17/2021 0044   GFRNONAA 78 03/03/2021 1053     Assessment/Plan: 6 Days Post-Op   Active Problems:   Closed right hip fracture, initial encounter ([facility])   Hip fracture (HCC)   Protein-calorie malnutrition, severe   Pressure injury of skin   Right femoral neck fracture status post hemiarthroplasty on 9/27  X-rays Right humerus and forearm -negative for fx  Post op recs: WB: WBAT with posterior hip precautions x6 weeks, hip abduction pillow to be used when in bed. No Knee Immobilizer Abx: ancef x23 hours post op Imaging:  intraop xrays Dressing: Aquacel to be left intact until follow-up  DVT prophylaxis: lovenox starting POD1 x4 weeks Follow up: 2 weeks after surgery for a wound check  Christine Christensen A Christine Christensen 06/20/2021, 8:21 AM   Charlies Constable, MD Cell [phone]

## 2021-06-20 NOTE — Progress Notes (Signed)
   06/20/21 1245  What Happened  Was fall witnessed? No  Was patient injured? No  Patient found on floor  Found by Staff-comment  Stated prior activity other (comment) (BSC, unattended)  Follow Up  MD notified Raelyn Mora, MD  Time MD notified 51  Family notified Yes - comment (Attempted to call son)  Time family notified 1246  Additional tests Yes-comment (hip xray)  Progress note created (see row info) Yes  Adult Fall Risk Assessment  Risk Factor Category (scoring not indicated) High fall risk per protocol (document High fall risk)  Patient Fall Risk Level High fall risk  Adult Fall Risk Interventions  Required Bundle Interventions *See Row Information* High fall risk - low, moderate, and high requirements implemented  Additional Interventions Use of appropriate toileting equipment (bedpan, BSC, etc.)  Screening for Fall Injury Risk (To be completed on HIGH fall risk patients) - Assessing Need for Floor Mats  Risk For Fall Injury- Criteria for Floor Mats Admitted as a result of a fall  Will Implement Floor Mats Yes

## 2021-06-20 NOTE — Progress Notes (Signed)
PROGRESS NOTE    Christine Christensen  FXT:[id number] DOB: [birth year] DOA: 06/12/2021 PCP: Nolene Ebbs, MD    Brief Narrative:  Past medical history of CVA with right-sided weakness, HTN, vascular dementia, Mnire's disease, moderate mitral stenosis.  Presents with complaints of a sudden fall in the kitchen and found to have right femoral neck fracture. Initially admitted to [facility] and transferred to [facility] for anesthesia recommendation due to high risk. Right hip hemiarthroplasty 9/27. Waiting for skilled nursing facility placement. Patient did have worsening confusion and hypoactive delirium that is slowly improving.   Assessment & Plan:   Active Problems:   Closed right hip fracture, initial encounter [facility])   Hip fracture (HCC)   Protein-calorie malnutrition, severe   Pressure injury of skin  Close traumatic right hip fracture: Right hip hemiarthroplasty 9/27 Weightbearing as tolerated, working with therapies.  Recommended skilled nursing facility. Not used any narcotics for the last 4 days, will discontinue. Used Tylenol 1 g every 6 hours scheduled, now not needing as much.  Will change to as needed. Lovenox for DVT prophylaxis for 4 weeks. Orthopedics following.  Recheck x-rays were stable.  Severe calcific mitral valve stenosis with severe pulmonary hypertension and mild aortic valve stenosis: Seen by cardiology.  Recommended conservative management.  Started on beta-blockers with metoprolol 12.5 mg twice daily.  We will continue on discharge. Fall precautions.  Delirium precautions. She does have severe mitral valve stenosis, advanced age and chronic ambulatory dysfunction from previous strokes.  History of stroke, right hemiparesis: On aspirin.  CTA of the head without acute findings.  Essential hypertension: Well-controlled.  Acute metabolic encephalopathy in the setting of vascular dementia: Anticipated delirium from surgery, medication use  and anesthesia along with ICU stay. Allow visitors to stay off hours. Improved today.  We will discontinue Remeron.  Hypomagnesemia: Replaced and improved.  Stable to transfer to skilled level of care.   DVT prophylaxis: enoxaparin (LOVENOX) injection 40 mg Start: 06/13/21 1000   Code Status: Full code Family Communication: None today.  Her son visits every day Disposition Plan: Status is: Inpatient  Remains inpatient appropriate because:Inpatient level of care appropriate due to severity of illness  Dispo: The patient is from: Home              Anticipated d/c is to: SNF              Patient currently is medically stable to transfer to skilled level of care.   Difficult to place patient No         Consultants:  Orthopedics Cardiology  Procedures:  Right hip hemiarthroplasty 9/27  Antimicrobials:  Perioperative   Subjective: Patient seen and examined.  No overnight events.  Mentation is slightly improved and now she is more participating.  Objective: Vitals:   06/19/21 0917 06/19/21 1200 06/19/21 1500 06/19/21 2219  BP: 95/73 103/60 123/61 131/66  Pulse: 90 80 68 66  Resp: 16 16 18 18   Temp: 98.3 F (36.8 C) 97.8 F (36.6 C) 99 F (37.2 C) 98.7 F (37.1 C)  TempSrc: Oral Oral Oral Oral  SpO2: 95% 99% 97% 97%  Weight:      Height:       No intake or output data in the 24 hours ending 06/20/21 1035  Filed Weights   06/12/21 1709 06/13/21 1912  Weight: 53.1 kg 53 kg    Examination:  General: Appears comfortable.  Not in any distress. Alert and oriented x1-2.  Pleasant to conversation.  Without any agitation or impulsiveness. Cardiovascular: S1-S2 normal.  Regular rate rhythm. Respiratory: Bilateral clear.  No added sounds. Gastrointestinal: Soft and nontender.  Bowel sounds present. Ext: Right lower extremity internally rotated, there is some foot drop.  Right lateral thigh incision clean and dry. Neuro: Right hemiparesis.       Data  Reviewed: I have personally reviewed following labs and imaging studies  CBC: Recent Labs  Lab 06/14/21 0052 06/15/21 1036 06/17/21 0044  WBC 9.0 11.6* 7.9  NEUTROABS  --   --  5.1  HGB 12.0 10.1* 10.6*  HCT 35.7* 30.6* 31.1*  MCV 89.3 88.7 87.4  PLT 159 146* 469   Basic Metabolic Panel: Recent Labs  Lab 06/14/21 0052 06/17/21 0044  NA 135 139  K 3.5 3.6  CL 104 103  CO2 22 27  GLUCOSE 94 92  BUN 12 5*  CREATININE 0.72 0.58  CALCIUM 8.9 8.7*  MG  --  1.6*  PHOS  --  2.8   GFR: Estimated Creatinine Clearance: 50.3 mL/min (by C-G formula based on SCr of 0.58 mg/dL). Liver Function Tests: Recent Labs  Lab 06/17/21 0044  AST 17  ALT 12  ALKPHOS 64  BILITOT 0.7  PROT 5.6*  ALBUMIN 2.5*   No results for input(s): LIPASE, AMYLASE in the last 168 hours. No results for input(s): AMMONIA in the last 168 hours. Coagulation Profile: No results for input(s): INR, PROTIME in the last 168 hours. Cardiac Enzymes: No results for input(s): CKTOTAL, CKMB, CKMBINDEX, TROPONINI in the last 168 hours. BNP (last 3 results) No results for input(s): PROBNP in the last 8760 hours. HbA1C: No results for input(s): HGBA1C in the last 72 hours. CBG: No results for input(s): GLUCAP in the last 168 hours. Lipid Profile: No results for input(s): CHOL, HDL, LDLCALC, TRIG, CHOLHDL, LDLDIRECT in the last 72 hours. Thyroid Function Tests: No results for input(s): TSH, T4TOTAL, FREET4, T3FREE, THYROIDAB in the last 72 hours. Anemia Panel: No results for input(s): VITAMINB12, FOLATE, FERRITIN, TIBC, IRON, RETICCTPCT in the last 72 hours. Sepsis Labs: No results for input(s): PROCALCITON, LATICACIDVEN in the last 168 hours.  Recent Results (from the past 240 hour(s))  Resp Panel by RT-PCR (Flu A&B, Covid) Nasopharyngeal Swab     Status: None   Collection Time: 06/12/21  3:30 PM   Specimen: Nasopharyngeal Swab; Nasopharyngeal(NP) swabs in vial transport medium  Result Value Ref Range  Status   SARS Coronavirus 2 by RT PCR NEGATIVE NEGATIVE Final    Comment: (NOTE) SARS-CoV-2 target nucleic acids are NOT DETECTED.  The SARS-CoV-2 RNA is generally detectable in upper respiratory specimens during the acute phase of infection. The lowest concentration of SARS-CoV-2 viral copies this assay can detect is 138 copies/mL. A negative result does not preclude SARS-Cov-2 infection and should not be used as the sole basis for treatment or other patient management decisions. A negative result may occur with  improper specimen collection/handling, submission of specimen other than nasopharyngeal swab, presence of viral mutation(s) within the areas targeted by this assay, and inadequate number of viral copies(<138 copies/mL). A negative result must be combined with clinical observations, patient history, and epidemiological information. The expected result is Negative.  Fact Sheet for Patients:  EntrepreneurPulse.com.au  Fact Sheet for Healthcare Providers:  IncredibleEmployment.be  This test is no t yet approved or cleared by the [place] FDA and  has been authorized for detection and/or diagnosis of SARS-CoV-2 by FDA under an Emergency Use Authorization (EUA). This EUA will remain  in effect (meaning this test can be used) for the duration of the COVID-19 declaration under Section 564(b)(1) of the Act, 21 U.S.C.section 360bbb-3(b)(1), unless the authorization is terminated  or revoked sooner.       Influenza A by PCR NEGATIVE NEGATIVE Final   Influenza B by PCR NEGATIVE NEGATIVE Final    Comment: (NOTE) The Xpert Xpress SARS-CoV-2/FLU/RSV plus assay is intended as an aid in the diagnosis of influenza from Nasopharyngeal swab specimens and should not be used as a sole basis for treatment. Nasal washings and aspirates are unacceptable for Xpert Xpress SARS-CoV-2/FLU/RSV testing.  Fact Sheet for  Patients: EntrepreneurPulse.com.au  Fact Sheet for Healthcare Providers: IncredibleEmployment.be  This test is not yet approved or cleared by the [place] FDA and has been authorized for detection and/or diagnosis of SARS-CoV-2 by FDA under an Emergency Use Authorization (EUA). This EUA will remain in effect (meaning this test can be used) for the duration of the COVID-19 declaration under Section 564(b)(1) of the Act, 21 U.S.C. section 360bbb-3(b)(1), unless the authorization is terminated or revoked.  Performed at [facility], [place] [address] [place], [place] [address]   Surgical pcr screen     Status: None   Collection Time: 06/13/21 10:14 AM   Specimen: Nasal Mucosa; Nasal Swab  Result Value Ref Range Status   MRSA, PCR NEGATIVE NEGATIVE Final   Staphylococcus aureus NEGATIVE NEGATIVE Final    Comment: (NOTE) The Xpert SA Assay (FDA approved for NASAL specimens in patients 66 years of age and older), is one component of a comprehensive surveillance program. It is not intended to diagnose infection nor to guide or monitor treatment. Performed at [facility], [place] [address], [place], [place] [address]          Radiology Studies: No results found.      Scheduled Meds:  aspirin  325 mg Oral Daily   Chlorhexidine Gluconate Cloth  6 each Topical Daily   enoxaparin (LOVENOX) injection  40 mg Subcutaneous Q24H   escitalopram  20 mg Oral Daily   feeding supplement  237 mL Oral BID BM   metoprolol tartrate  12.5 mg Oral BID   multivitamin with minerals  1 tablet Oral Daily   Continuous Infusions:     LOS: 8 days    Time spent: 25 minutes    Barb Merino, MD [facility] Pager [phone]

## 2021-06-21 DIAGNOSIS — F03918 Unspecified dementia, unspecified severity, with other behavioral disturbance: Secondary | ICD-10-CM

## 2021-06-21 DIAGNOSIS — S72001A Fracture of unspecified part of neck of right femur, initial encounter for closed fracture: Secondary | ICD-10-CM | POA: Diagnosis not present

## 2021-06-21 LAB — CBC WITH DIFFERENTIAL/PLATELET
Abs Immature Granulocytes: 0.03 10*3/uL (ref 0.00–0.07)
Basophils Absolute: 0.1 10*3/uL (ref 0.0–0.1)
Basophils Relative: 1 %
Eosinophils Absolute: 0.2 10*3/uL (ref 0.0–0.5)
Eosinophils Relative: 2 %
HCT: 32.5 % — ABNORMAL LOW (ref 36.0–46.0)
Hemoglobin: 10.7 g/dL — ABNORMAL LOW (ref 12.0–15.0)
Immature Granulocytes: 0 %
Lymphocytes Relative: 27 %
Lymphs Abs: 2 10*3/uL (ref 0.7–4.0)
MCH: 28.7 pg (ref 26.0–34.0)
MCHC: 32.9 g/dL (ref 30.0–36.0)
MCV: 87.1 fL (ref 80.0–100.0)
Monocytes Absolute: 0.6 10*3/uL (ref 0.1–1.0)
Monocytes Relative: 9 %
Neutro Abs: 4.5 10*3/uL (ref 1.7–7.7)
Neutrophils Relative %: 61 %
Platelets: 366 10*3/uL (ref 150–400)
RBC: 3.73 MIL/uL — ABNORMAL LOW (ref 3.87–5.11)
RDW: 14.6 % (ref 11.5–15.5)
WBC: 7.4 10*3/uL (ref 4.0–10.5)
nRBC: 0 % (ref 0.0–0.2)

## 2021-06-21 LAB — COMPREHENSIVE METABOLIC PANEL
ALT: 16 U/L (ref 0–44)
AST: 20 U/L (ref 15–41)
Albumin: 2.6 g/dL — ABNORMAL LOW (ref 3.5–5.0)
Alkaline Phosphatase: 63 U/L (ref 38–126)
Anion gap: 8 (ref 5–15)
BUN: 11 mg/dL (ref 8–23)
CO2: 27 mmol/L (ref 22–32)
Calcium: 9.1 mg/dL (ref 8.9–10.3)
Chloride: 101 mmol/L (ref 98–111)
Creatinine, Ser: 0.57 mg/dL (ref 0.44–1.00)
GFR, Estimated: >60 mL/min (ref >60)
Glucose, Bld: 94 mg/dL (ref 70–99)
Potassium: 4.2 mmol/L (ref 3.5–5.1)
Sodium: 136 mmol/L (ref 135–145)
Total Bilirubin: 0.5 mg/dL (ref 0.3–1.2)
Total Protein: 6.1 g/dL — ABNORMAL LOW (ref 6.5–8.1)

## 2021-06-21 LAB — MAGNESIUM: Magnesium: 1.8 mg/dL (ref 1.7–2.4)

## 2021-06-21 LAB — VITAMIN B12: Vitamin B-12: 546 pg/mL (ref 180–914)

## 2021-06-21 LAB — TSH: TSH: 0.835 u[IU]/mL (ref 0.350–4.500)

## 2021-06-21 LAB — PHOSPHORUS: Phosphorus: 4.1 mg/dL (ref 2.5–4.6)

## 2021-06-21 MED ORDER — QUETIAPINE 12.5 MG HALF TABLET
12.5000 mg | ORAL_TABLET | Freq: Four times a day (QID) | ORAL | Status: DC | PRN
  Administered 2021-06-26 – 2021-06-27 (×2): 12.5 mg via ORAL
  Filled 2021-06-21 (×3): qty 1

## 2021-06-21 MED ORDER — MELATONIN 3 MG PO TABS
3.0000 mg | ORAL_TABLET | Freq: Every day | ORAL | Status: DC
  Administered 2021-06-22 – 2021-06-28 (×7): 3 mg via ORAL
  Filled 2021-06-21 (×7): qty 1

## 2021-06-21 MED ORDER — OLANZAPINE 10 MG IM SOLR
2.5000 mg | Freq: Four times a day (QID) | INTRAMUSCULAR | Status: DC | PRN
  Administered 2021-06-25: 2.5 mg via INTRAMUSCULAR
  Filled 2021-06-21 (×2): qty 10

## 2021-06-21 NOTE — TOC Progression Note (Signed)
Transition of Care [facility]) - Progression Note    Patient Details  Name: JENNA ROUTZAHN MRN: [id number] Date of Birth: [birth year]  Transition of Care [facility]) CM/SW [place], [place] Phone Number: 06/21/2021, 12:12 PM  Clinical Narrative:    CSW spoke with pt son about events that occurred last night with pt behaviors that caused pt to have restraints. Pt son [name] states that he has not seen pt act out that way and that his family does not understand why this is happening. [name] was able to speak with the MD and feels better about pt recovery. [name] also states that other family members does not want pt to go to [facility]. CSW will reach out to [facility] to provide follow up on insurance cancellation and reach out to [facility] for bed availably.   CSW attempted to contact [facility] at [facility], no answer. CSW left a VM.  CSW will continue to follow for DC planning.        Expected Discharge Plan and Services                                                 Social Determinants of Health (SDOH) Interventions    Readmission Risk Interventions No flowsheet data found.

## 2021-06-21 NOTE — Consult Note (Addendum)
[facility] Face-to-Face Psychiatry Consult   Reason for Consult:  Delirium Referring Physician:   Barb Merino, MD Patient Identification: Christine Christensen MRN:  [id number] Principal Diagnosis: <principal problem not specified> Diagnosis:  Active Problems:   Closed right hip fracture, initial encounter ([place])   Hip fracture ([place])   Protein-calorie malnutrition, severe   Pressure injury of skin   Total Time spent with patient: 30 minutes  Assessment  Christine Christensen is a 75 y.o. 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 Psychiatry was consulted for concern for delirium.  Outpatient psychotropic medications include Lexapro 20mg  and Remeron 7.5mg  for Anxiety, unspecified disorder and historically he has had a positive response to these medications. She was compliant with medications prior to admission as evidenced by chart review and family who live with patient. On initial examination, patient was appropriate behavior and partially oriented. We plan to continued patient's Lexapro and continued delirium protocol. We have updated PRN medications and recommended workup for common and reversible causes of delirium.   On assessment today patient appears to have some delirium in conjunction with her underlying dementia. Family was in the room and were able to attest that patient has been progressing closer to her baseline although still far removed from baseline. Based on hx provided from family and  chart review patient appears to have had more hypoactive since being hospitalized; became hyperactive/aggressive last night.   This is not unexpected as patient recently broke her hip and had surgery. Patient had a  low threshold for insults to her cognitive function due to her baseline vascular dementia. It is likely that with each insult to patient's cognitive function her overall baseline functioning will decrease and some of her new confusion will become chronic. At this time is is best to minimize insult and maximize delirium precautions.   Delirium Hx of Advanced Vascular dementia  Medications Standing - Continue home Lexparo 20mg  daily - Continue to hold home Remeron 7.5mg , attempting to minimize serotonergic medications - START melatonin 3 mg - if significant agitation tonight, will restart remeron tomorrow.   PRN - Start PRN Agitation protocol: Seroquel 12.5mg  q6h PRN or IM Zyprexa 2.5mg  IM q6h if refuses PO. Avoid unopposed BZD   - Recommend scheduled pain medication with fixed times to decrease chance of night time awakenings - Recommend scheduled bowel regimen  Other recommendations - Consider scheduled pain medication with fixed times to decrease chance of night time awakenings - Consider  scheduled bowel regimen - Continue Delirium protocols: have also advised family to continue to speak with patient, keep patient alert during the day and comfortable   - Recommend labs, repeat CBC/BMP, obtain TSH, obtain B12  Reviewed EKG; most recent Qtc 441 9/25  ## Safety and Observation Level:  - Based on my clinical evaluation, I estimate the patient to be at low risk of self harm in the current setting - At this time, we recommend a standard level of observation. This decision is based on my review of the chart including patient's history and current presentation, interview of the patient, mental status examination, and consideration of suicide risk including evaluating suicidal ideation, plan, intent, suicidal or self-harm behaviors, risk factors, and protective factors. This judgment is based on our ability  to directly address suicide risk, implement suicide prevention strategies and develop  a safety plan while the patient is in the clinical setting. Please contact our team if there is a concern that risk level has changed.  Delirium recs 1. Avoid benzodiazepines, antihistamines, anticholinergics, and minimize opiate use as these may worsen delirium. 2: Assess, prevent and manage pain as lack of treatment can result in delirium.  3: Provide appropriate lighting and clear signage; a clock and calendar should be easily visible to the patient. 4: Monitor environmental factors. Reduce light and noise at night (close shades, turn off lights, turn off TV, ect). Correct any alterations in sleep cycle. 5: Reorient the patient to person, place, time and situation on each encounter.  6: Correct sensory deficits if possible (replace eye glasses, hearing aids, ect). 7: Avoid restraints if able. Severely delirious patients benefit from constant observation by a sitter.     Dispo: per primary team We will continue to follow this patient.        Subjective:   Christine Christensen is a 75 y.o. female patient admitted with R  hip fracture after a fall with PPH of Advanced Vascular dementia and Anxiety .  HPI:  [name] a 75 y.o. female patient admitted with R  hip fracture after a fall with PPH of Advanced Vascular dementia and Anxiety . Patient was reported to have sustained R hip fracture after a fall in her kitchen when attempting to switch from cane to rolling walker.   On assessment today patient appears calm and oriented to person, place, month and year. Patient is not oriented to the time for day despite the window being open and had difficulty with formal attention testing as evidenced below; overall appears to have ongoing delirium with good recent memory (had been reoriented to place/month/year earlier in AM).   Patient also consistently calls her son in the room by the name of another son. Patient reports she is aware that she had a fall and this resulted in her  hospitalization. Patient reports that her mood has been "alright" overall during her hospitalization. Patient reports that her appetite has been low lately, but endorses that she would be willing to eat her favorite food, fried chicken "if it's good." Patient reports that she has noticed that it has been harder to concentrate since she has been in the hospital but is not too bothered by this. Patient denies SI, HI, and AVH.   Collateral [name]) Son reports that she had had worsening of underlying confusion since the death of a close relative a couple of months ago; disrupted her routine (talked to this relative every night before bed). This is in the background of ~2 years of decline after death of ex spouse. He had noted confusion since her hip surgery; yesterday was the worst day. Today is a little better but not back to baseline prior to surgery.   Extensive psychoeducation provided.   Past Psychiatric History: Anxiety Lexapro and Remeron have been prescribed   Past Medical History:  Past Medical History:  Diagnosis Date  . Anemia    history of anemia  . Aortic stenosis    mild aortic stenosis 07/27/17 echo  . Arthritis   . Heart murmur   . Hypertension   . Mitral stenosis    moderate mitral stenosis 07/27/17 echo  . Stroke [facility])    stroke x2, 2005 and 2008, some right sided weakness  . Syncope and collapse 10/25/2017  . Urinary incontinence  Past Surgical History:  Procedure Laterality Date  . CATARACT EXTRACTION, BILATERAL Bilateral   . CHOLECYSTECTOMY    . HIP ARTHROPLASTY Right 06/14/2021   Procedure: ARTHROPLASTY BIPOLAR HIP (HEMIARTHROPLASTY);  Surgeon: Willaim Sheng, MD;  Location: [place];  Service: Orthopedics;  Laterality: Right;  . TONSILLECTOMY    . TOTAL KNEE ARTHROPLASTY Right    right knee 2006  . TOTAL KNEE ARTHROPLASTY Left 08/06/2017   Procedure: LEFT TOTAL KNEE ARTHROPLASTY;  Surgeon: Vickey Huger, MD;  Location: [place];  Service: Orthopedics;   Laterality: Left;  . TUBAL LIGATION     Family History:  Family History  Problem Relation Age of Onset  . Dementia Mother   . Heart disease Father   . Cerebral palsy Sister    Family Psychiatric  History: Mother had dementia (73s) Social History:  Social History   Substance and Sexual Activity  Alcohol Use Yes   Comment: Sometimes     Social History   Substance and Sexual Activity  Drug Use Yes  . Types: Marijuana    Social History   Socioeconomic History  . Marital status: Divorced    Spouse name: Not on file  . Number of children: 2  . Years of education: 9  . Highest education level: Not on file  Occupational History  . Not on file  Tobacco Use  . Smoking status: Every Day    Packs/day: 0.50    Years: 56.00    Pack years: 28.00    Types: Cigarettes  . Smokeless tobacco: Never  Vaping Use  . Vaping Use: Never used  Substance and Sexual Activity  . Alcohol use: Yes    Comment: Sometimes  . Drug use: Yes    Types: Marijuana  . Sexual activity: Not on file  Other Topics Concern  . Not on file  Social History Narrative   Lives with son   Caffeine use: Drinks coffee 3 times weekly, Iced tea sometimes   Right handed    Social Determinants of Health   Financial Resource Strain: Not on file  Food Insecurity: Not on file  Transportation Needs: Not on file  Physical Activity: Not on file  Stress: Not on file  Social Connections: Not on file   Additional Social History:    Allergies:  No Known Allergies  Labs:  Results for orders placed or performed during the hospital encounter of 06/12/21 (from the past 48 hour(s))  CBC with Differential/Platelet     Status: Abnormal   Collection Time: 06/21/21 10:34 AM  Result Value Ref Range   WBC 7.4 4.0 - 10.5 K/uL   RBC 3.73 (L) 3.87 - 5.11 MIL/uL   Hemoglobin 10.7 (L) 12.0 - 15.0 g/dL   HCT 32.5 (L) 36.0 - 46.0 %   MCV 87.1 80.0 - 100.0 fL   MCH 28.7 26.0 - 34.0 pg   MCHC 32.9 30.0 - 36.0 g/dL   RDW  14.6 11.5 - 15.5 %   Platelets 366 150 - 400 K/uL   nRBC 0.0 0.0 - 0.2 %   Neutrophils Relative % 61 %   Neutro Abs 4.5 1.7 - 7.7 K/uL   Lymphocytes Relative 27 %   Lymphs Abs 2.0 0.7 - 4.0 K/uL   Monocytes Relative 9 %   Monocytes Absolute 0.6 0.1 - 1.0 K/uL   Eosinophils Relative 2 %   Eosinophils Absolute 0.2 0.0 - 0.5 K/uL   Basophils Relative 1 %   Basophils Absolute 0.1 0.0 - 0.1 K/uL  Immature Granulocytes 0 %   Abs Immature Granulocytes 0.03 0.00 - 0.07 K/uL    Comment: Performed at [facility], [place] [address], [place], [place] [address]  Comprehensive metabolic panel     Status: Abnormal   Collection Time: 06/21/21 10:34 AM  Result Value Ref Range   Sodium 136 135 - 145 mmol/L   Potassium 4.2 3.5 - 5.1 mmol/L   Chloride 101 98 - 111 mmol/L   CO2 27 22 - 32 mmol/L   Glucose, Bld 94 70 - 99 mg/dL    Comment: Glucose reference range applies only to samples taken after fasting for at least 8 hours.   BUN 11 8 - 23 mg/dL   Creatinine, Ser 0.57 0.44 - 1.00 mg/dL   Calcium 9.1 8.9 - 10.3 mg/dL   Total Protein 6.1 (L) 6.5 - 8.1 g/dL   Albumin 2.6 (L) 3.5 - 5.0 g/dL   AST 20 15 - 41 U/L   ALT 16 0 - 44 U/L   Alkaline Phosphatase 63 38 - 126 U/L   Total Bilirubin 0.5 0.3 - 1.2 mg/dL   GFR, Estimated >60 >60 mL/min    Comment: (NOTE) Calculated using the CKD-EPI Creatinine Equation (2021)    Anion gap 8 5 - 15    Comment: Performed at [facility], [place] [address] [place], [place] [address]  Magnesium     Status: None   Collection Time: 06/21/21 10:34 AM  Result Value Ref Range   Magnesium 1.8 1.7 - 2.4 mg/dL    Comment: Performed at [facility] [address], [place], [place] [address]  Phosphorus     Status: None   Collection Time: 06/21/21 10:34 AM  Result Value Ref Range   Phosphorus 4.1 2.5 - 4.6 mg/dL    Comment: Performed at [facility] [address] [place], [place] [address]  TSH     Status: None   Collection Time: 06/21/21  10:34 AM  Result Value Ref Range   TSH 0.835 0.350 - 4.500 uIU/mL    Comment: Performed by a 3rd Generation assay with a functional sensitivity of <=0.01 uIU/mL. Performed at [facility], [place] [address], [place], [place] [address]   Vitamin B12     Status: None   Collection Time: 06/21/21 10:34 AM  Result Value Ref Range   Vitamin B-12 546 180 - 914 pg/mL    Comment: (NOTE) This assay is not validated for testing neonatal or myeloproliferative syndrome specimens for Vitamin B12 levels. Performed at [facility], [place] [address] [place],  [address]     Current Facility-Administered Medications  Medication Dose Route Frequency Provider Last Rate Last Admin  . acetaminophen (TYLENOL) tablet 1,000 mg  1,000 mg Oral Q6H PRN Barb Merino, MD   1,000 mg at 06/20/21 1150  . aspirin tablet 325 mg  325 mg Oral Daily Willaim Sheng, MD   325 mg at 06/20/21 1150  . bisacodyl (DULCOLAX) EC tablet 5 mg  5 mg Oral Daily PRN Willaim Sheng, MD      . Chlorhexidine Gluconate Cloth 2 % PADS 6 each  6 each Topical Daily Barb Merino, MD   6 each at 06/21/21 1023  . enoxaparin (LOVENOX) injection 40 mg  40 mg Subcutaneous Q24H Willaim Sheng, MD   40 mg at 06/20/21 1150  . escitalopram (LEXAPRO) tablet 20 mg  20 mg Oral Daily Willaim Sheng, MD  20 mg at 06/20/21 1150  . feeding supplement (ENSURE ENLIVE / ENSURE PLUS) liquid 237 mL  237 mL Oral BID BM Barb Merino, MD   237 mL at 06/20/21 1300  . HYDROcodone-acetaminophen (NORCO/VICODIN) 5-325 MG per tablet 1-2 tablet  1-2 tablet Oral Q6H PRN Willaim Sheng, MD   2 tablet at 06/15/21 2011  . melatonin tablet 3 mg  3 mg Oral QHS McQuilla, Jai B, MD      . metoprolol tartrate (LOPRESSOR) tablet 12.5 mg  12.5 mg Oral BID Willaim Sheng, MD   12.5 mg at 06/20/21 2105  . multivitamin with minerals tablet 1 tablet  1 tablet Oral Daily Willaim Sheng, MD   1 tablet at 06/20/21 1150  .  QUEtiapine (SEROQUEL) tablet 12.5 mg  12.5 mg Oral Q6H PRN Freida Busman, MD       Or  . OLANZapine (ZYPREXA) injection 2.5 mg  2.5 mg Intramuscular Q6H PRN Freida Busman, MD      . senna-docusate (Senokot-S) tablet 1 tablet  1 tablet Oral QHS PRN Willaim Sheng, MD        Musculoskeletal: Strength & Muscle Tone: decreased Gait & Station:  remains in bed on exam Patient leans: N/A   Psychiatric Specialty Exam:  Presentation  General Appearance:  -- (Patient appears in hospital attire underneath her blankets.) Eye Contact: Fair Speech: Clear and Coherent Speech Volume: Normal Handedness: No data recorded  Mood and Affect  Mood: Irritable ("I was alright until you were asking all these questions.") Affect: Congruent  Thought Process  Thought Processes: Goal Directed Descriptions of Associations:Circumstantial Orientation:-- (Oriented to person, place,month and year and situation. Not oriented to time of day) Thought Content:Illogical History of Schizophrenia/Schizoaffective disorder:No data recorded Duration of Psychotic Symptoms:No data recorded Hallucinations:Hallucinations: None Ideas of Reference:None Suicidal Thoughts:Suicidal Thoughts: No Homicidal Thoughts:Homicidal Thoughts: No  Sensorium  Memory: Immediate Poor; Recent Fair; Remote Fair Judgment: Impaired Insight: Shallow  Executive Functions  Concentration: Poor  Unable to complete month's of the year backwards and gives up after getting to October. Also unable to spell WORLD backwards and gives up after L.  Attention Span: Poor Frequently gives up on assigned task questions and has to be asked some questions multiple times because patient begins staring off into space.  Recall: Poor (1/3) Fund of Knowledge: Fair Language: Fair  Psychomotor Activity  Psychomotor Activity: Psychomotor Activity: Normal  Assets  Assets: Armed forces logistics/support/administrative officer; Desire for Improvement; Resilience;  Social Support; Housing  Sleep  Sleep: Sleep: Fair  Physical Exam: Physical Exam HENT:     Head: Normocephalic and atraumatic.  Pulmonary:     Effort: Pulmonary effort is normal.  Neurological:     Mental Status: She is alert.     Comments: Oriented to person, place, month and year. Not oriented to time of day and unable to recognize son correctly. Continues to call him by her other son's name.    Review of Systems  Respiratory:  Negative for cough.   Cardiovascular:  Negative for chest pain.  Musculoskeletal:  Positive for myalgias.  Psychiatric/Behavioral:  Negative for suicidal ideas.   Blood pressure 121/60, pulse 89, temperature 98.6 F (37 C), temperature source Axillary, resp. rate 19, height 5\' 3"  (1.6 m), weight 53 kg, SpO2 97 %. Body mass index is 20.7 kg/m.   PGY-2 Freida Busman, MD 06/21/2021 12:52 PM

## 2021-06-21 NOTE — Progress Notes (Signed)
PROGRESS NOTE    Christine Christensen  MEQ:[id number] DOB: [birth year] DOA: 06/12/2021 PCP: Christine Ebbs, MD    Brief Narrative:  Past medical history of CVA with right-sided weakness, HTN, vascular dementia, Mnire's disease, moderate mitral stenosis.  Presented with complaints of a sudden fall in the kitchen and found to have right femoral neck fracture. Initially admitted to [facility] and transferred to [facility] for anesthesia recommendation due to high risk. Right hip hemiarthroplasty 9/27 after undergoing cardiac evaluation and taking risk of surgery. Initially, Patient did have worsening confusion and hypoactive delirium that was slowly improving.  10/3, evening patient had ground-level fall from bedside commode.  In the evening, she became confused and became combative and wanted to go home with her cousin and required 1 dose of Ativan as well as soft restraints overnight with complete resolution of symptoms.   Assessment & Plan:   Active Problems:   Closed right hip fracture, initial encounter [facility])   Hip fracture (HCC)   Protein-calorie malnutrition, severe   Pressure injury of skin  Close traumatic right hip fracture: Right hip hemiarthroplasty 9/27 Weightbearing as tolerated, working with therapies.  Recommended skilled nursing facility. Not used any narcotics for the last 5 days, only using for severe pain. Used Tylenol 1 g every 6 hours scheduled, now not needing as much.  Changed to as needed Tylenol. Lovenox for DVT prophylaxis for 4 weeks. Orthopedics following.  Recheck x-rays were stable.  Severe calcific mitral valve stenosis with severe pulmonary hypertension and mild aortic valve stenosis: Seen by cardiology.  Recommended conservative management.  Started on beta-blockers with metoprolol 12.5 mg twice daily.  We will continue on discharge. Fall precautions.  Delirium precautions. She does have severe mitral valve stenosis, advanced age and  chronic ambulatory dysfunction from previous strokes.  History of stroke, right hemiparesis: On aspirin.  CTA of the head without acute findings done at preop.  Essential hypertension: Well-controlled.  Acute metabolic encephalopathy in the setting of vascular dementia: Anticipated delirium from surgery, medication use and anesthesia along with ICU stay. Allow visitors to stay off hours. Improved today.   Initially patient remained hypoactive requiring discontinuation of opiates and Remeron. 10/3, patient had episode of agitation and restlessness that improved with 1 dose of Ativan and needed bilateral wrist restraints overnight.  Ultimately stabilized. Rechecking electrolytes including magnesium and phosphorus, TSH and B12 today.  Also will rule out an infection, will check urine.  Patient is medically stabilizing.  Will monitor in the hospital next 24 hours because of last night's events of agitation to ensure that she does not have repeat symptoms at the skilled rehab.  10/4.  Have extensively counseled and educated patient's son about expected course of delirium from multiple causes.  Patient had done very well given her high risk surgery and underlying vascular dementia and right hemiparesis. Family had multiple concerns, wanted to have neuropsych evaluation.  I explained to them that dementia care is long-term care.  We will work on any reversible causes and focus on rehab.  I discussed case with psychiatry to help with any additional intervention or management that can be useful for the patient. I have explained to the patient's family that minimizing intervention then maximizing will be more helpful.    DVT prophylaxis: enoxaparin (LOVENOX) injection 40 mg Start: 06/13/21 1000   Code Status: Full code Family Communication: Mr. Christine Christensen at bedside. Disposition Plan: Status is: Inpatient  Remains inpatient appropriate because:Inpatient level of care appropriate due  to severity  of illness  Dispo: The patient is from: Home              Anticipated d/c is to: SNF              Patient currently is medically stabilizing.  Will monitor next 24 hours before discharging to SNF.   Difficult to place patient No   Spent about 35 minutes explaining the situations to patient's son.  We had very good conversation and explanation of situation.  I also discussed case with psychiatry team to help if they can give their opinion or help with any other symptom management options.     Consultants:  Orthopedics Cardiology Cardiology  Procedures:  Right hip hemiarthroplasty 9/27  Antimicrobials:  Perioperative   Subjective:  Patient seen and examined.  Today morning she is very pleasant.  She even knows today is October 4.  She talks about her son who lives in [place].  Mental status mostly cleared since last night.  She did have episode of agitation and became combative yesterday evening when her family left without taking her home with them. Patient denies any pain.  She knows she is at [facility].   Objective: Vitals:   06/19/21 2219 06/20/21 1150 06/20/21 1900 06/21/21 0850  BP: 131/66 120/67 128/61 118/67  Pulse: 66 (!) 106 99 89  Resp: 18 18 20 18   Temp: 98.7 F (37.1 C)  98.3 F (36.8 C) 98.4 F (36.9 C)  TempSrc: Oral  Axillary Axillary  SpO2: 97% 97%  97%  Weight:      Height:        Intake/Output Summary (Last 24 hours) at 06/21/2021 1125 Last data filed at 06/21/2021 0900 Gross per 24 hour  Intake 200 ml  Output --  Net 200 ml    Filed Weights   06/12/21 1709 06/13/21 1912  Weight: 53.1 kg 53 kg    Examination:  General: Appears comfortable.  Not in any distress. Pleasant today.  Alert oriented x1-2.  Without any agitation or impulsiveness.  Denies any hallucinations or delusions. Cardiovascular: S1-S2 normal.  Regular rate rhythm. Respiratory: Bilateral clear.  No added sounds. Gastrointestinal: Soft and nontender.  Bowel  sounds present. Ext: Right lower extremity internally rotated, there is some foot drop.  Right lateral thigh incision clean and dry. Neuro: Right hemiparesis.       Data Reviewed: I have personally reviewed following labs and imaging studies  CBC: Recent Labs  Lab 06/15/21 1036 06/17/21 0044 06/21/21 1034  WBC 11.6* 7.9 7.4  NEUTROABS  --  5.1 4.5  HGB 10.1* 10.6* 10.7*  HCT 30.6* 31.1* 32.5*  MCV 88.7 87.4 87.1  PLT 146* 192 235   Basic Metabolic Panel: Recent Labs  Lab 06/17/21 0044 06/21/21 1034  NA 139 136  K 3.6 4.2  CL 103 101  CO2 27 27  GLUCOSE 92 94  BUN 5* 11  CREATININE 0.58 0.57  CALCIUM 8.7* 9.1  MG 1.6* 1.8  PHOS 2.8 4.1   GFR: Estimated Creatinine Clearance: 50.3 mL/min (by C-G formula based on SCr of 0.57 mg/dL). Liver Function Tests: Recent Labs  Lab 06/17/21 0044 06/21/21 1034  AST 17 20  ALT 12 16  ALKPHOS 64 63  BILITOT 0.7 0.5  PROT 5.6* 6.1*  ALBUMIN 2.5* 2.6*   No results for input(s): LIPASE, AMYLASE in the last 168 hours. No results for input(s): AMMONIA in the last 168 hours. Coagulation Profile: No results for input(s): INR,  PROTIME in the last 168 hours. Cardiac Enzymes: No results for input(s): CKTOTAL, CKMB, CKMBINDEX, TROPONINI in the last 168 hours. BNP (last 3 results) No results for input(s): PROBNP in the last 8760 hours. HbA1C: No results for input(s): HGBA1C in the last 72 hours. CBG: No results for input(s): GLUCAP in the last 168 hours. Lipid Profile: No results for input(s): CHOL, HDL, LDLCALC, TRIG, CHOLHDL, LDLDIRECT in the last 72 hours. Thyroid Function Tests: No results for input(s): TSH, T4TOTAL, FREET4, T3FREE, THYROIDAB in the last 72 hours. Anemia Panel: No results for input(s): VITAMINB12, FOLATE, FERRITIN, TIBC, IRON, RETICCTPCT in the last 72 hours. Sepsis Labs: No results for input(s): PROCALCITON, LATICACIDVEN in the last 168 hours.  Recent Results (from the past 240 hour(s))  Resp Panel  by RT-PCR (Flu A&B, Covid) Nasopharyngeal Swab     Status: None   Collection Time: 06/12/21  3:30 PM   Specimen: Nasopharyngeal Swab; Nasopharyngeal(NP) swabs in vial transport medium  Result Value Ref Range Status   SARS Coronavirus 2 by RT PCR NEGATIVE NEGATIVE Final    Comment: (NOTE) SARS-CoV-2 target nucleic acids are NOT DETECTED.  The SARS-CoV-2 RNA is generally detectable in upper respiratory specimens during the acute phase of infection. The lowest concentration of SARS-CoV-2 viral copies this assay can detect is 138 copies/mL. A negative result does not preclude SARS-Cov-2 infection and should not be used as the sole basis for treatment or other patient management decisions. A negative result may occur with  improper specimen collection/handling, submission of specimen other than nasopharyngeal swab, presence of viral mutation(s) within the areas targeted by this assay, and inadequate number of viral copies(<138 copies/mL). A negative result must be combined with clinical observations, patient history, and epidemiological information. The expected result is Negative.  Fact Sheet for Patients:  EntrepreneurPulse.com.au  Fact Sheet for Healthcare Providers:  IncredibleEmployment.be  This test is no t yet approved or cleared by the [place] FDA and  has been authorized for detection and/or diagnosis of SARS-CoV-2 by FDA under an Emergency Use Authorization (EUA). This EUA will remain  in effect (meaning this test can be used) for the duration of the COVID-19 declaration under Section 564(b)(1) of the Act, 21 U.S.C.section 360bbb-3(b)(1), unless the authorization is terminated  or revoked sooner.       Influenza A by PCR NEGATIVE NEGATIVE Final   Influenza B by PCR NEGATIVE NEGATIVE Final    Comment: (NOTE) The Xpert Xpress SARS-CoV-2/FLU/RSV plus assay is intended as an aid in the diagnosis of influenza from Nasopharyngeal swab  specimens and should not be used as a sole basis for treatment. Nasal washings and aspirates are unacceptable for Xpert Xpress SARS-CoV-2/FLU/RSV testing.  Fact Sheet for Patients: EntrepreneurPulse.com.au  Fact Sheet for Healthcare Providers: IncredibleEmployment.be  This test is not yet approved or cleared by the [place] FDA and has been authorized for detection and/or diagnosis of SARS-CoV-2 by FDA under an Emergency Use Authorization (EUA). This EUA will remain in effect (meaning this test can be used) for the duration of the COVID-19 declaration under Section 564(b)(1) of the Act, 21 U.S.C. section 360bbb-3(b)(1), unless the authorization is terminated or revoked.  Performed at [facility], [place] [address] [place], [place] [address]   Surgical pcr screen     Status: None   Collection Time: 06/13/21 10:14 AM   Specimen: Nasal Mucosa; Nasal Swab  Result Value Ref Range Status   MRSA, PCR NEGATIVE NEGATIVE Final   Staphylococcus aureus NEGATIVE NEGATIVE Final  Comment: (NOTE) The Xpert SA Assay (FDA approved for NASAL specimens in patients 32 years of age and older), is one component of a comprehensive surveillance program. It is not intended to diagnose infection nor to guide or monitor treatment. Performed at [facility], [place] [address] [place], [place] [address]          Radiology Studies: DG HIP PORT UNILAT WITH PELVIS 1V RIGHT  Result Date: 06/20/2021 CLINICAL DATA:  Status post fall. EXAM: DG HIP (WITH OR WITHOUT PELVIS) 1V PORT RIGHT COMPARISON:  None. FINDINGS: A total right hip replacement is seen without evidence of surrounding lucency to suggest the presence of hardware loosening or infection. There is no evidence of acute fracture or dislocation. Small calcified uterine fibroids are suspected. IMPRESSION: Total right hip replacement without evidence of hardware loosening or infection.  Electronically Signed   By: Virgina Norfolk M.D.   On: 06/20/2021 19:29        Scheduled Meds:  aspirin  325 mg Oral Daily   Chlorhexidine Gluconate Cloth  6 each Topical Daily   enoxaparin (LOVENOX) injection  40 mg Subcutaneous Q24H   escitalopram  20 mg Oral Daily   feeding supplement  237 mL Oral BID BM   metoprolol tartrate  12.5 mg Oral BID   multivitamin with minerals  1 tablet Oral Daily   Continuous Infusions:     LOS: 9 days    Time spent: 35 minutes    Barb Merino, MD [facility] Pager [phone]

## 2021-06-21 NOTE — Progress Notes (Signed)
   06/21/21 0920  Clinical Encounter Type  Visited With Patient and family together;Family;Patient;Health care provider  Visit Type Follow-up;Psychological support;Social support  Referral From Family;Nurse  Stress Factors  Family Stress Factors Loss;Loss of control;Major life changes;Health changes   [place] visited pt. today in response to page to assist family/pt. in completing AD.  Pt. lying in bed with son [name] at bedside.  In extended visit, son shared that pt. is recovering from hip replacement surgery but that being in the hospital has exacerbated her underlying vascular dementia and caused her to become increasingly confused and, as of yesterday, aggressive at times.  Son is concerned that pt. might be discharged to rehab before these symptoms have resolved, leading to pt. possibly being "kicked out" of rehab.  Son also is concerned to establish clear medical decisionmaking protocol in case pt.'s confusion worsens.  [place] spoke w/pt. and RN to assess pt.'s general appropriateness for signing AD and though she seemed calm and relatively oriented this AM (and confirms that she would want her sons to make decisions for her if she were unable to speak), per son and RN's description of pt.'s confusion CH did not feel comfortable coordinating pt. signing legal documents.  Son shared that he and his brother recently had an extended phone conversation w/pt. in which pt. expressed her wishes re: life-prolonging treatment; son also says he and his brother have been working together to help coordinate care for pt.  CH explained that by [place] law pt.'s sons are already her surrogate medical decisionmakers in the absence of a living spouse, and pt. has essentially performed the function of completing an AD by communicating her wishes clearly to her sons.  [place] excused himself when providers arrived to assess pt. but son and pt. are aware of chaplains' availability.  Lindaann Pascal, Chaplain Pager: [phone]

## 2021-06-21 NOTE — Plan of Care (Signed)
  Problem: Activity: Goal: Risk for activity intolerance will decrease Outcome: Progressing   Problem: Coping: Goal: Level of anxiety will decrease Outcome: Progressing   Problem: Skin Integrity: Goal: Risk for impaired skin integrity will decrease Outcome: Progressing   Problem: Safety: Goal: Non-violent Restraint(s) Outcome: Progressing

## 2021-06-21 NOTE — Progress Notes (Signed)
Occupational Therapy Treatment Patient Details Name: Christine Christensen MRN: [id number] DOB: 12/31/1945 Today's Date: 06/21/2021   History of present illness Pt is a 75 y.o. female admitted 06/12/21 with fall at home; found to have R femoral neck fx. Sx delayed as pt deemed high risk; pre-op cardiology evaluation for near syncope, mitral stenosis. S/p R hip hemiarthroplasty on 9/27. Pt guarding R arm post-op; RUE imaging negative for acute injury. PMH includes vascular dementia, HTN, CVAs with residual R-side weakness (2005, 2008), Meniere's disease, bilateral TKAs.   OT comments  Patient seen by skilled OT to address hip precautions, bed mobility, functional mobility, grooming standing at sink and toilet tranfers/hygiene. Patient was asked about hip precaution and she was unable to name any and was reminded. Patient was in bed with wedge applied. Patient ambulated to sink and performed grooming with min assist for balance.  Patient performed toilet transfer to [facility] and required assistance to complete hygiene.  Patient was asked about hip precautions and required max verbal cues to recall. Patient was assisted back to bed and positioned with wedge. Hip precautions were reviewed again with 0/3.  Acute OT to continue to follow.    Recommendations for follow up therapy are one component of a multi-disciplinary discharge planning process, led by the attending physician.  Recommendations may be updated based on patient status, additional functional criteria and insurance authorization.    Follow Up Recommendations  SNF;Supervision/Assistance - 24 hour    Equipment Recommendations  3 in 1 bedside commode;Other (comment)    Recommendations for Other Services      Precautions / Restrictions Precautions Precautions: Fall;Posterior Hip Precaution Booklet Issued: Yes (comment) Precaution Comments: patient is unable to recall precautions Other Brace: abduction wedge       Mobility Bed Mobility Overal  bed mobility: Needs Assistance Bed Mobility: Supine to Sit;Sit to Supine     Supine to sit: Mod assist;HOB elevated Sit to supine: Mod assist   General bed mobility comments: Assist with BLEs and to maintain hip precautions    Transfers Overall transfer level: Needs assistance Equipment used: Rolling walker (2 wheeled) Transfers: Sit to/from [facility] Sit to Stand: Mod assist Stand pivot transfers: Mod assist       General transfer comment: mod assist to power up and min assist to use rw    Balance Overall balance assessment: Needs assistance Sitting-balance support: Bilateral upper extremity supported Sitting balance-Leahy Scale: Fair Sitting balance - Comments: min guard   Standing balance support: Bilateral upper extremity supported;During functional activity Standing balance-Leahy Scale: Poor Standing balance comment: able to stand at sink for grooming                           ADL either performed or assessed with clinical judgement   ADL Overall ADL's : Needs assistance/impaired     Grooming: Wash/dry hands;Wash/dry face;Oral care;Minimal assistance;Standing Grooming Details (indicate cue type and reason): balance assist while standing at sink with patient using BUE to load toothbrush             Lower Body Dressing: Sit to/from stand;Maximal assistance Lower Body Dressing Details (indicate cue type and reason): donned adult diaper Toilet Transfer: Minimal assistance;BSC;RW Toilet Transfer Details (indicate cue type and reason): min assist with walker and transfer Toileting- Clothing Manipulation and Hygiene: Moderate assistance;Sit to/from stand Toileting - Clothing Manipulation Details (indicate cue type and reason): patient performed toilet hygiene standing with assistance for balance and to complete  Functional mobility during ADLs: Minimal assistance;Rolling walker General ADL Comments: required frequent cues on hip  precaution during transfers     Vision       Perception     Praxis      Cognition Arousal/Alertness: Awake/alert Behavior During Therapy: WFL for tasks assessed/performed Overall Cognitive Status: History of cognitive impairments - at baseline Area of Impairment: Following commands;Safety/judgement;Awareness;Problem solving                     Memory: Decreased recall of precautions Following Commands: Follows one step commands with increased time;Follows one step commands inconsistently Safety/Judgement: Decreased awareness of safety;Decreased awareness of deficits Awareness: Intellectual Problem Solving: Decreased initiation;Requires verbal cues;Slow processing General Comments: Patient was unable to recall hip precautions following 3 trials        Exercises     Shoulder Instructions       General Comments      Pertinent Vitals/ Pain       Pain Assessment: Faces Faces Pain Scale: Hurts little more Pain Location: R LE at hip Pain Descriptors / Indicators: Grimacing;Discomfort;Guarding Pain Intervention(s): Monitored during session  Home Living                                          Prior Functioning/Environment              Frequency  Min 2X/week        Progress Toward Goals  OT Goals(current goals can now be found in the care plan section)  Progress towards OT goals: Progressing toward goals  Acute Rehab OT Goals Patient Stated Goal: get stronger OT Goal Formulation: With patient Time For Goal Achievement: 06/29/21 Potential to Achieve Goals: Good ADL Goals Pt Will Perform Lower Body Bathing: with mod assist;sit to/from stand;with adaptive equipment Pt Will Perform Lower Body Dressing: with mod assist;with adaptive equipment;sitting/lateral leans;sit to/from stand Pt Will Transfer to Toilet: with mod assist;stand pivot transfer;bedside commode Pt Will Perform Toileting - Clothing Manipulation and hygiene: with mod  assist;sitting/lateral leans;sit to/from stand Additional ADL Goal #1: Pt to verbalize 3/3 posterior hip precautions with min verbal cues during ADLs  Plan Discharge plan remains appropriate    Co-evaluation                 AM-PAC OT "6 Clicks" Daily Activity     Outcome Measure   Help from another person eating meals?: A Little Help from another person taking care of personal grooming?: A Little Help from another person toileting, which includes using toliet, bedpan, or urinal?: A Lot Help from another person bathing (including washing, rinsing, drying)?: A Lot Help from another person to put on and taking off regular upper body clothing?: A Little Help from another person to put on and taking off regular lower body clothing?: A Lot 6 Click Score: 15    End of Session Equipment Utilized During Treatment: Rolling walker;Gait belt  OT Visit Diagnosis: Unsteadiness on feet (R26.81);Other abnormalities of gait and mobility (R26.89);Muscle weakness (generalized) (M62.81);History of falling (Z91.81);Pain Pain - Right/Left: Right Pain - part of body: Hip;Leg   Activity Tolerance Patient tolerated treatment well   Patient Left in bed;with call bell/phone within reach;with bed alarm set;Other (comment) (pad on floor)   Nurse Communication Mobility status        Time: 1426-1501 OT Time Calculation (min): 35 min  Charges: OT General  Charges $OT Visit: 1 Visit OT Treatments $Self Care/Home Management : 23-37 mins  Lodema Hong, [place] 06/21/2021, 3:17 PM

## 2021-06-22 DIAGNOSIS — F03918 Unspecified dementia, unspecified severity, with other behavioral disturbance: Secondary | ICD-10-CM

## 2021-06-22 DIAGNOSIS — F05 Delirium due to known physiological condition: Secondary | ICD-10-CM

## 2021-06-22 DIAGNOSIS — W19XXXA Unspecified fall, initial encounter: Secondary | ICD-10-CM | POA: Diagnosis not present

## 2021-06-22 DIAGNOSIS — S72001A Fracture of unspecified part of neck of right femur, initial encounter for closed fracture: Secondary | ICD-10-CM | POA: Diagnosis not present

## 2021-06-22 LAB — SARS CORONAVIRUS 2 (TAT 6-24 HRS): SARS Coronavirus 2: NEGATIVE

## 2021-06-22 MED ORDER — ACETAMINOPHEN 500 MG PO TABS
500.0000 mg | ORAL_TABLET | Freq: Three times a day (TID) | ORAL | Status: DC
  Administered 2021-06-22 – 2021-06-29 (×21): 500 mg via ORAL
  Filled 2021-06-22 (×21): qty 1

## 2021-06-22 MED ORDER — SENNOSIDES-DOCUSATE SODIUM 8.6-50 MG PO TABS
1.0000 | ORAL_TABLET | Freq: Two times a day (BID) | ORAL | Status: DC
  Administered 2021-06-22 – 2021-06-29 (×12): 1 via ORAL
  Filled 2021-06-22 (×13): qty 1

## 2021-06-22 NOTE — Consult Note (Signed)
[facility] Face-to-Face Psychiatry Consult   Reason for Consult:  Delirium Referring Physician:   Barb Merino, MD Patient Identification: Christine Christensen MRN:  [id number] Principal Diagnosis: <principal problem not specified> Diagnosis:  Active Problems:   Closed right hip fracture, initial encounter ([place])   Hip fracture ([place])   Protein-calorie malnutrition, severe   Pressure injury of skin   Delirium due to another medical condition   Total Time spent with patient: 25 minutes  Assessment  Christine Christensen is a 75 y.o. 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 Psychiatry was consulted for concern for delirium.  Outpatient psychotropic medications include Lexapro 20mg  and Remeron 7.5mg  for Anxiety, unspecified disorder and historically he has had a positive response to these medications. She was compliant with medications prior to admission as evidenced by chart review and family who live with patient. On initial examination, patient was appropriate behavior and partially oriented. We plan to continued patient's Lexapro and continued delirium protocol. We have updated PRN medications and recommended workup for common and reversible causes of delirium.   10/5: labwork ordered was largely benign; U/a was not collected. Patient appears less confused than yesterday, able to count backwards from 42 although failed to stop at 27. She was able to make some jokes and overall appeared more present and engaged. I called son and had extensive 15 minute conversation on expected course and discussed need for u/a with team and staff.    This is not unexpected as patient recently broke her hip and had surgery.  Patient had a low threshold for insults to her cognitive function due to her baseline vascular dementia. It is likely that with each insult to patient's cognitive function her overall baseline functioning will decrease and some of her new confusion will become chronic. At this time is is best to minimize insult and maximize delirium precautions.   Delirium Hx of Advanced Vascular dementia  Medications Standing - Continue home Lexparo 20mg  daily - Continue to hold home Remeron 7.5mg , attempting to minimize serotonergic medications - START melatonin 3 mg - if significant agitation tonight, will restart remeron tomorrow.   PRN - Start PRN Agitation protocol: Seroquel 12.5mg  q6h PRN or IM Zyprexa 2.5mg  IM q6h if refuses PO. Avoid unopposed BZD   - Recommend scheduled pain medication with fixed times to decrease chance of night time awakenings - Recommend scheduled bowel regimen  Other recommendations - Consider scheduled pain medication with fixed times to decrease chance of night time awakenings - Consider  scheduled bowel regimen - Continue Delirium protocols: have also advised family to continue to speak with patient, keep patient alert during the day and comfortable  Labs  - repeat labs (CBC, CMP, TSH, B12) largely wnl most signifcant for low albumin  Reviewed EKG; most recent Qtc 441 9/25  ## Safety and Observation Level:  - Based on my clinical evaluation, I estimate the patient to be at low risk of self harm in the current setting - At this time, we recommend a standard level of observation. This decision is based on my review of the chart including patient's history and current presentation, interview of the patient, mental status examination, and consideration of suicide risk including evaluating suicidal ideation, plan, intent, suicidal or  self-harm behaviors, risk factors, and protective factors. This judgment is based on our ability to directly address suicide risk, implement  suicide prevention strategies and develop a safety plan while the patient is in the clinical setting. Please contact our team if there is a concern that risk level has changed.  Delirium recs 1. Avoid benzodiazepines, antihistamines, anticholinergics, and minimize opiate use as these may worsen delirium. 2: Assess, prevent and manage pain as lack of treatment can result in delirium.  3: Provide appropriate lighting and clear signage; a clock and calendar should be easily visible to the patient. 4: Monitor environmental factors. Reduce light and noise at night (close shades, turn off lights, turn off TV, ect). Correct any alterations in sleep cycle. 5: Reorient the patient to person, place, time and situation on each encounter.  6: Correct sensory deficits if possible (replace eye glasses, hearing aids, ect). 7: Avoid restraints if able. Severely delirious patients benefit from constant observation by a sitter.     Dispo: per primary team We will continue to follow this patient.        Subjective:   Christine Christensen is a 75 y.o. female patient admitted with R  hip fracture after a fall with PPH of Advanced Vascular dementia and Anxiety .  HPI:  Christine Christensen a 75 y.o. female patient admitted with R  hip fracture after a fall with PPH of Advanced Vascular dementia and Anxiety . Patient was reported to have sustained R hip fracture after a fall in her kitchen when attempting to switch from cane to rolling walker.   On assessment today patient appears calm and oriented to person, place, month and year. She was able to deduce that it was AM which is an improvement from yesterday. She did not remember her son visiting yesterday but did make an appropriate joke "I don't live with him, he lives with me". Did better with attention testing (counted backwards from 42 without issue although failed to stop at 27). Recent memory remains intact, refers to fall as something "stupid" she did (attempted  to deflect from this). Does not think she got fried chicken yesterday but reports good sleep, ate breakfast.    Patient denies SI, HI, and AVH.   Collateral [name]) He has not been by today but was happy to hear about mild improvement noted above. Answered questions as best as I was able. He had some questions about in-home care vs nursing facility (from mental health/delirium/dementia perspective) if pt does not improve to prior baseline; recommended in-home nursing support if safe and feasible (he has significant physical limitations).   Extensive psychoeducation provided.   Past Psychiatric History: Anxiety Lexapro and Remeron have been prescribed   Past Medical History:  Past Medical History:  Diagnosis Date  . Anemia    history of anemia  . Aortic stenosis    mild aortic stenosis 07/27/17 echo  . Arthritis   . Heart murmur   . Hypertension   . Mitral stenosis    moderate mitral stenosis 07/27/17 echo  . Stroke [facility])    stroke x2, 2005 and 2008, some right sided weakness  . Syncope and collapse 10/25/2017  . Urinary incontinence     Past Surgical History:  Procedure Laterality Date  . CATARACT EXTRACTION, BILATERAL Bilateral   . CHOLECYSTECTOMY    . HIP ARTHROPLASTY Right 06/14/2021   Procedure: ARTHROPLASTY BIPOLAR HIP (HEMIARTHROPLASTY);  Surgeon: Willaim Sheng, MD;  Location: [place];  Service: Orthopedics;  Laterality: Right;  .  TONSILLECTOMY    . TOTAL KNEE ARTHROPLASTY Right    right knee 2006  . TOTAL KNEE ARTHROPLASTY Left 08/06/2017   Procedure: LEFT TOTAL KNEE ARTHROPLASTY;  Surgeon: Vickey Huger, MD;  Location: [place];  Service: Orthopedics;  Laterality: Left;  . TUBAL LIGATION     Family History:  Family History  Problem Relation Age of Onset  . Dementia Mother   . Heart disease Father   . Cerebral palsy Sister    Family Psychiatric  History: Mother had dementia (72s) Social History:  Social History   Substance and Sexual Activity  Alcohol Use  Yes   Comment: Sometimes     Social History   Substance and Sexual Activity  Drug Use Yes  . Types: Marijuana    Social History   Socioeconomic History  . Marital status: Divorced    Spouse name: Not on file  . Number of children: 2  . Years of education: 45  . Highest education level: Not on file  Occupational History  . Not on file  Tobacco Use  . Smoking status: Every Day    Packs/day: 0.50    Years: 56.00    Pack years: 28.00    Types: Cigarettes  . Smokeless tobacco: Never  Vaping Use  . Vaping Use: Never used  Substance and Sexual Activity  . Alcohol use: Yes    Comment: Sometimes  . Drug use: Yes    Types: Marijuana  . Sexual activity: Not on file  Other Topics Concern  . Not on file  Social History Narrative   Lives with son   Caffeine use: Drinks coffee 3 times weekly, Iced tea sometimes   Right handed    Social Determinants of Health   Financial Resource Strain: Not on file  Food Insecurity: Not on file  Transportation Needs: Not on file  Physical Activity: Not on file  Stress: Not on file  Social Connections: Not on file   Additional Social History:    Allergies:  No Known Allergies  Labs:  Results for orders placed or performed during the hospital encounter of 06/12/21 (from the past 48 hour(s))  CBC with Differential/Platelet     Status: Abnormal   Collection Time: 06/21/21 10:34 AM  Result Value Ref Range   WBC 7.4 4.0 - 10.5 K/uL   RBC 3.73 (L) 3.87 - 5.11 MIL/uL   Hemoglobin 10.7 (L) 12.0 - 15.0 g/dL   HCT 32.5 (L) 36.0 - 46.0 %   MCV 87.1 80.0 - 100.0 fL   MCH 28.7 26.0 - 34.0 pg   MCHC 32.9 30.0 - 36.0 g/dL   RDW 14.6 11.5 - 15.5 %   Platelets 366 150 - 400 K/uL   nRBC 0.0 0.0 - 0.2 %   Neutrophils Relative % 61 %   Neutro Abs 4.5 1.7 - 7.7 K/uL   Lymphocytes Relative 27 %   Lymphs Abs 2.0 0.7 - 4.0 K/uL   Monocytes Relative 9 %   Monocytes Absolute 0.6 0.1 - 1.0 K/uL   Eosinophils Relative 2 %   Eosinophils Absolute 0.2  0.0 - 0.5 K/uL   Basophils Relative 1 %   Basophils Absolute 0.1 0.0 - 0.1 K/uL   Immature Granulocytes 0 %   Abs Immature Granulocytes 0.03 0.00 - 0.07 K/uL    Comment: Performed at [facility], [address], [place], [place] [address]  Comprehensive metabolic panel     Status: Abnormal   Collection Time: 06/21/21 10:34 AM  Result Value Ref Range   Sodium 136 135 - 145 mmol/L   Potassium 4.2 3.5 - 5.1 mmol/L   Chloride 101 98 - 111 mmol/L   CO2 27 22 - 32 mmol/L   Glucose, Bld 94 70 - 99 mg/dL    Comment: Glucose reference range applies only to samples taken after fasting for at least 8 hours.   BUN 11 8 - 23 mg/dL   Creatinine, Ser 0.57 0.44 - 1.00 mg/dL   Calcium 9.1 8.9 - 10.3 mg/dL   Total Protein 6.1 (L) 6.5 - 8.1 g/dL   Albumin 2.6 (L) 3.5 - 5.0 g/dL   AST 20 15 - 41 U/L   ALT 16 0 - 44 U/L   Alkaline Phosphatase 63 38 - 126 U/L   Total Bilirubin 0.5 0.3 - 1.2 mg/dL   GFR, Estimated >60 >60 mL/min    Comment: (NOTE) Calculated using the CKD-EPI Creatinine Equation (2021)    Anion gap 8 5 - 15    Comment: Performed at [facility], [place] [address], [place], [place] [address]  Magnesium     Status: None   Collection Time: 06/21/21 10:34 AM  Result Value Ref Range   Magnesium 1.8 1.7 - 2.4 mg/dL    Comment: Performed at [facility] [address], [place], [place] [address]  Phosphorus     Status: None   Collection Time: 06/21/21 10:34 AM  Result Value Ref Range   Phosphorus 4.1 2.5 - 4.6 mg/dL    Comment: Performed at [facility] [address], [place], [place] [address]  TSH     Status: None   Collection Time: 06/21/21 10:34 AM  Result Value Ref Range   TSH 0.835 0.350 - 4.500 uIU/mL    Comment: Performed by a 3rd Generation assay with a functional sensitivity of <=0.01 uIU/mL. Performed at [facility], [place] [address] [place], [place] [address]   Vitamin B12     Status: None   Collection Time: 06/21/21 10:34 AM  Result  Value Ref Range   Vitamin B-12 546 180 - 914 pg/mL    Comment: (NOTE) This assay is not validated for testing neonatal or myeloproliferative syndrome specimens for Vitamin B12 levels. Performed at [facility], [place] [address] [place], [place] [address]   SARS CORONAVIRUS 2 (TAT 6-24 HRS) Nasopharyngeal Nasopharyngeal Swab     Status: None   Collection Time: 06/21/21  5:40 PM   Specimen: Nasopharyngeal Swab  Result Value Ref Range   SARS Coronavirus 2 NEGATIVE NEGATIVE    Comment: (NOTE) SARS-CoV-2 target nucleic acids are NOT DETECTED.  The SARS-CoV-2 RNA is generally detectable in upper and lower respiratory specimens during the acute phase of infection. Negative results do not preclude SARS-CoV-2 infection, do not rule out co-infections with other pathogens, and should not be used as the sole basis for treatment or other patient management decisions. Negative results must be combined with clinical observations, patient history, and epidemiological information. The expected result is Negative.  Fact Sheet for Patients: SugarRoll.be  Fact Sheet for Healthcare Providers: https://www.woods-mathews.com/  This test is not yet approved or cleared by the [place] FDA and  has been authorized for detection and/or diagnosis of SARS-CoV-2 by FDA under an Emergency Use Authorization (EUA). This EUA will remain  in effect (meaning this test can be used) for the duration of the COVID-19 declaration under Se ction 564(b)(1) of the Act, 21 U.S.C. section 360bbb-3(b)(1),  unless the authorization is terminated or revoked sooner.  Performed at [facility], [place] [address] [place], [place] [address]     Current Facility-Administered Medications  Medication Dose Route Frequency Provider Last Rate Last Admin  . acetaminophen (TYLENOL) tablet 1,000 mg  1,000 mg Oral Q6H PRN Barb Merino, MD   1,000 mg at 06/20/21 1150  .  acetaminophen (TYLENOL) tablet 500 mg  500 mg Oral TID Regalado, Belkys A, MD   500 mg at 06/22/21 0948  . aspirin tablet 325 mg  325 mg Oral Daily Willaim Sheng, MD   325 mg at 06/22/21 0949  . bisacodyl (DULCOLAX) EC tablet 5 mg  5 mg Oral Daily PRN Willaim Sheng, MD      . Chlorhexidine Gluconate Cloth 2 % PADS 6 each  6 each Topical Daily Barb Merino, MD   6 each at 06/21/21 1023  . enoxaparin (LOVENOX) injection 40 mg  40 mg Subcutaneous Q24H Willaim Sheng, MD   40 mg at 06/22/21 1051  . escitalopram (LEXAPRO) tablet 20 mg  20 mg Oral Daily Willaim Sheng, MD   20 mg at 06/22/21 0949  . feeding supplement (ENSURE ENLIVE / ENSURE PLUS) liquid 237 mL  237 mL Oral BID BM Barb Merino, MD   237 mL at 06/20/21 1300  . HYDROcodone-acetaminophen (NORCO/VICODIN) 5-325 MG per tablet 1-2 tablet  1-2 tablet Oral Q6H PRN Willaim Sheng, MD   1 tablet at 06/22/21 [phone]  . melatonin tablet 3 mg  3 mg Oral QHS McQuilla, Jai B, MD      . metoprolol tartrate (LOPRESSOR) tablet 12.5 mg  12.5 mg Oral BID Willaim Sheng, MD   12.5 mg at 06/22/21 0949  . multivitamin with minerals tablet 1 tablet  1 tablet Oral Daily Willaim Sheng, MD   1 tablet at 06/22/21 0949  . QUEtiapine (SEROQUEL) tablet 12.5 mg  12.5 mg Oral Q6H PRN Freida Busman, MD       Or  . OLANZapine (ZYPREXA) injection 2.5 mg  2.5 mg Intramuscular Q6H PRN Damita Dunnings B, MD      . senna-docusate (Senokot-S) tablet 1 tablet  1 tablet Oral BID Regalado, Belkys A, MD        Musculoskeletal: Strength & Muscle Tone: decreased Gait & Station:  remains in bed on exam Patient leans: N/A   Psychiatric Specialty Exam:  Presentation  General Appearance:  Appropriate for Environment; Fairly Groomed [facility] attire, sitting in chair next to open window, has glasses on) Eye Contact: Good Speech: Clear and Coherent; Normal Rate Speech Volume: Normal Handedness: No data recorded  Mood and  Affect  Mood: -- (frustrated (immobility, hospital stay)) Affect: Appropriate; Full Range  Thought Process  Thought Processes: Coherent Descriptions of Associations:Intact Orientation:Full (Time, Place and Person) Thought Content:-- (no Si, HI, delusions or paranoia endorsed) History of Schizophrenia/Schizoaffective disorder:No data recorded Duration of Psychotic Symptoms:No data recorded Hallucinations:Hallucinations: None Ideas of Reference:None Suicidal Thoughts:Suicidal Thoughts: No Homicidal Thoughts:Homicidal Thoughts: No  Sensorium  Memory: Immediate Poor; Recent Poor; Remote Fair Judgment: Good (as evidenced by compliance with care, not getting out of bed) Insight: Fair  Community education officer  Concentration: Poor  Unable to complete month's of the year backwards and gives up after getting to October. Also unable to spell WORLD backwards and gives up after L.  Attention Span: Poor Frequently gives up on assigned task questions and has to be asked some questions multiple times because patient begins staring  off into space.  Recall: Poor (1/3) Fund of Knowledge: Fair Language: Good  Psychomotor Activity  Psychomotor Activity: Psychomotor Activity: Normal  Assets  Assets: Desire for Improvement; Housing; Social Support  Sleep  Sleep: Sleep: Good  Physical Exam: Physical Exam HENT:     Head: Normocephalic and atraumatic.  Pulmonary:     Effort: Pulmonary effort is normal.  Neurological:     Mental Status: She is alert.     Comments: Oriented to person, place, month and year and newly to time of day   Review of Systems  Respiratory:  Negative for cough.   Cardiovascular:  Negative for chest pain.  Musculoskeletal:  Negative for joint pain and myalgias.  Psychiatric/Behavioral:  Negative for suicidal ideas.   Blood pressure 113/69, pulse 76, temperature 97.9 F (36.6 C), temperature source Oral, resp. rate 18, height 5\' 3"  (1.6 m), weight 53 kg,  SpO2 98 %. Body mass index is 20.7 kg/m.   Attending physician  Kerrie [place] Elston [place]ape 06/22/2021 11:39 AM

## 2021-06-22 NOTE — Progress Notes (Addendum)
     Subjective:  Patient is doing well this morning. Mobility much improved. Pain is well controlled. She is up in the recliner now awaiting lunch.  No concerns.  Objective:   VITALS:   Vitals:   06/21/21 1730 06/21/21 2011 06/22/21 0400 06/22/21 0750  BP: 129/72 111/66 104/72 113/69  Pulse: 90 (!) 106 92 76  Resp: 18 16 18    Temp: 98.7 F (37.1 C) 98.6 F (37 C) 98.3 F (36.8 C) 97.9 F (36.6 C)  TempSrc: Axillary Oral Oral Oral  SpO2: 97% 96% 97% 98%  Weight:      Height:        Intact pulses distally Incision: dressing C/D/I (dressing chnaged 10/5: incision healing well no erythema or drainage) Intact ankle dorsiflexion/plantarflexion. Reports intact sensation in the drosal and plantar aspect of her foot.  Lab Results  Component Value Date   WBC 7.4 06/21/2021   HGB 10.7 (L) 06/21/2021   HCT 32.5 (L) 06/21/2021   MCV 87.1 06/21/2021   PLT 366 06/21/2021   BMET    Component Value Date/Time   NA 136 06/21/2021 1034   K 4.2 06/21/2021 1034   CL 101 06/21/2021 1034   CO2 27 06/21/2021 1034   GLUCOSE 94 06/21/2021 1034   BUN 11 06/21/2021 1034   CREATININE 0.57 06/21/2021 1034   CREATININE 0.75 03/03/2021 1053   CALCIUM 9.1 06/21/2021 1034   GFRNONAA >60 06/21/2021 1034   GFRNONAA 78 03/03/2021 1053     Assessment/Plan: 8 Days Post-Op   Active Problems:   Closed right hip fracture, initial encounter ([place])   Hip fracture ([facility])   Protein-calorie malnutrition, severe   Pressure injury of skin   Delirium due to another medical condition   Dementia with behavioral disturbance   Right femoral neck fracture status post hemiarthroplasty on 9/27  X-rays Right humerus and forearm -negative for fx  Post op recs: WB: WBAT with posterior hip precautions x6 weeks, hip abduction pillow to be used when in bed. No Knee Immobilizer Abx: ancef x23 hours post op Imaging: intraop xrays Dressing: Aquacel dressing changed by MD 10/5 incision healing well. DVT  prophylaxis: lovenox starting POD1 x4 weeks Follow up: 2 weeks after discharge with Dr. Zachery Dakins at [facility]. Address: [address] [place], [place], [place] [address] Office Phone: [phone]  [place] 06/22/2021, 12:03 PM   Charlies Constable, MD Cell [phone]

## 2021-06-22 NOTE — TOC Progression Note (Signed)
Transition of Care [facility]) - Progression Note    Patient Details  Name: Christine Christensen MRN: [id number] Date of Birth: 05-29-1946  Transition of Care [facility]) CM/SW Contact  Reece Agar, [place] Phone Number: 06/22/2021, 4:43 PM  Clinical Narrative:    CSW contacted pt son [name] to provide update on [facility] declining bed offer. [name] wants pt to go to a facility in [place] with other family members who will be able to help with pt care.         Expected Discharge Plan and Services                                                 Social Determinants of Health (SDOH) Interventions    Readmission Risk Interventions No flowsheet data found.

## 2021-06-22 NOTE — TOC Progression Note (Signed)
Transition of Care [facility]) - Progression Note    Patient Details  Name: Christine Christensen MRN: [id number] Date of Birth: Jun 21, 1946  Transition of Care [facility]) CM/SW Contact  Reece Agar, [place] Phone Number: 06/22/2021, 10:15 AM  Clinical Narrative:    CSW attempted to contact [facility] at [facility] to confirm bed for placement and available. No answer CSW left a VM.        Expected Discharge Plan and Services                                                 Social Determinants of Health (SDOH) Interventions    Readmission Risk Interventions No flowsheet data found.

## 2021-06-22 NOTE — Progress Notes (Signed)
PROGRESS NOTE    Christine Christensen  IWP:[id number] DOB: Jan 22, 1946 DOA: 06/12/2021 PCP: Nolene Ebbs, MD   Brief Narrative: 75 year old with past medical history significant for CVA with right-sided weakness, hypertension, vascular dementia, Mnire's disease, moderate mitral stenosis who presents after sudden fall in her kitchen and found to have a right femoral neck fracture.  Patient underwent right hip hemiarthroplasty on 9/27 after undergoing cardiac evaluation and taking risk of surgery.  Patient hospital course complicated by confusion and hypoactive delirium and was a slowly to improve.  Patient became more confused and agitated overnight 10/3, she required a dose of Ativan.  She was evaluated by psych who recommended delirium precaution and to hold Remeron. She Has improved, she is more calm.  Awaiting skilled nursing facility for rehab   Assessment & Plan:   Active Problems:   Closed right hip fracture, initial encounter ([place])   Hip fracture (HCC)   Protein-calorie malnutrition, severe   Pressure injury of skin   Delirium due to another medical condition   Dementia with behavioral disturbance   1-Close traumatic right hip fracture: Right hip hemiarthroplasty 9/27 -Weightbearing as tolerated. -Plan for skilled nursing facility. Tylenol as needed for pain Need Lovenox for DVT prophylaxis for 4 weeks Ortho following  2-Severe calcific mitral valve stenosis with severe pulmonary hypertension and mild aortic valve stenosis: Evaluated by cardiology who recommended conservative management. Patient was a started on low-dose beta-blocker.  3-History of stroke, Right Hemiparesis  -On aspirin.  -CT head without acute finding.  Essential HTN;   Acute Metabolic Encephalopathy: in setting of vascular dementia. Delirium post op, medication, anesthesia.  Delirium precaution.  Patient initially had hypoactive delirium, opioid and Remeron discontinued.  Subsequently she had an  episode of agitation.  Received a dose of Ativan. -Patient B12 normal. -Asked nurse to send UA. -Evaluated by psych Remeron held, Seroquel PRN for agitation.       Pressure Injury 06/16/21 Buttocks Left Stage 2 -  Partial thickness loss of dermis presenting as a shallow open injury with a red, pink wound bed without slough. approx 22mm (Active)  06/16/21 0000  Location: Buttocks  Location Orientation: Left  Staging: Stage 2 -  Partial thickness loss of dermis presenting as a shallow open injury with a red, pink wound bed without slough.  Wound Description (Comments): approx 48mm  Present on Admission: No     Nutrition Problem: Severe Malnutrition Etiology: chronic illness (dementia and CVA)    Signs/Symptoms: severe fat depletion, severe muscle depletion    Interventions: Ensure Enlive (each supplement provides 350kcal and 20 grams of protein), Liberalize Diet  Estimated body mass index is 20.7 kg/m as calculated from the following:   Height as of this encounter: 5\' 3"  (1.6 m).   Weight as of this encounter: 53 kg.   DVT prophylaxis: Lovenox Code Status: Full code Family Communication: Disposition Plan:  Status is: Inpatient  Remains inpatient appropriate because:Inpatient level of care appropriate due to severity of illness  Dispo: The patient is from: Home              Anticipated d/c is to: SNF              Patient currently is medically stable to d/c.   Difficult to place patient No        Consultants:  Ortho Psych  Procedures:  Hip Sx  Antimicrobials:    Subjective: She is alert, oriented to place and person.  Sitting recliner eating lunch.  Denies  worsening pain.   Objective: Vitals:   06/21/21 1730 06/21/21 2011 06/22/21 0400 06/22/21 0750  BP: 129/72 111/66 104/72 113/69  Pulse: 90 (!) 106 92 76  Resp: 18 16 18    Temp: 98.7 F (37.1 C) 98.6 F (37 C) 98.3 F (36.8 C) 97.9 F (36.6 C)  TempSrc: Axillary Oral Oral Oral  SpO2: 97%  96% 97% 98%  Weight:      Height:        Intake/Output Summary (Last 24 hours) at 06/22/2021 1520 Last data filed at 06/21/2021 1800 Gross per 24 hour  Intake 240 ml  Output --  Net 240 ml   Filed Weights   06/12/21 1709 06/13/21 1912  Weight: 53.1 kg 53 kg    Examination:  General exam: Appears calm and comfortable  Respiratory system: Clear to auscultation. Respiratory effort normal. Cardiovascular system: S1 & S2 heard, RRR. No JVD, murmurs, rubs, gallops or clicks. No pedal edema. Gastrointestinal system: Abdomen is nondistended, soft and nontender. No organomegaly or masses felt. Normal bowel sounds heard. Central nervous system: Alert and oriented. Follows command Extremities: no edema  Data Reviewed: I have personally reviewed following labs and imaging studies  CBC: Recent Labs  Lab 06/17/21 0044 06/21/21 1034  WBC 7.9 7.4  NEUTROABS 5.1 4.5  HGB 10.6* 10.7*  HCT 31.1* 32.5*  MCV 87.4 87.1  PLT 192 258   Basic Metabolic Panel: Recent Labs  Lab 06/17/21 0044 06/21/21 1034  NA 139 136  K 3.6 4.2  CL 103 101  CO2 27 27  GLUCOSE 92 94  BUN 5* 11  CREATININE 0.58 0.57  CALCIUM 8.7* 9.1  MG 1.6* 1.8  PHOS 2.8 4.1   GFR: Estimated Creatinine Clearance: 50.3 mL/min (by C-G formula based on SCr of 0.57 mg/dL). Liver Function Tests: Recent Labs  Lab 06/17/21 0044 06/21/21 1034  AST 17 20  ALT 12 16  ALKPHOS 64 63  BILITOT 0.7 0.5  PROT 5.6* 6.1*  ALBUMIN 2.5* 2.6*   No results for input(s): LIPASE, AMYLASE in the last 168 hours. No results for input(s): AMMONIA in the last 168 hours. Coagulation Profile: No results for input(s): INR, PROTIME in the last 168 hours. Cardiac Enzymes: No results for input(s): CKTOTAL, CKMB, CKMBINDEX, TROPONINI in the last 168 hours. BNP (last 3 results) No results for input(s): PROBNP in the last 8760 hours. HbA1C: No results for input(s): HGBA1C in the last 72 hours. CBG: No results for input(s): GLUCAP in  the last 168 hours. Lipid Profile: No results for input(s): CHOL, HDL, LDLCALC, TRIG, CHOLHDL, LDLDIRECT in the last 72 hours. Thyroid Function Tests: Recent Labs    06/21/21 1034  TSH 0.835   Anemia Panel: Recent Labs    06/21/21 1034  VITAMINB12 546   Sepsis Labs: No results for input(s): PROCALCITON, LATICACIDVEN in the last 168 hours.  Recent Results (from the past 240 hour(s))  Resp Panel by RT-PCR (Flu A&B, Covid) Nasopharyngeal Swab     Status: None   Collection Time: 06/12/21  3:30 PM   Specimen: Nasopharyngeal Swab; Nasopharyngeal(NP) swabs in vial transport medium  Result Value Ref Range Status   SARS Coronavirus 2 by RT PCR NEGATIVE NEGATIVE Final    Comment: (NOTE) SARS-CoV-2 target nucleic acids are NOT DETECTED.  The SARS-CoV-2 RNA is generally detectable in upper respiratory specimens during the acute phase of infection. The lowest concentration of SARS-CoV-2 viral copies this assay can detect is 138 copies/mL. A negative result does not preclude SARS-Cov-2 infection  and should not be used as the sole basis for treatment or other patient management decisions. A negative result may occur with  improper specimen collection/handling, submission of specimen other than nasopharyngeal swab, presence of viral mutation(s) within the areas targeted by this assay, and inadequate number of viral copies(<138 copies/mL). A negative result must be combined with clinical observations, patient history, and epidemiological information. The expected result is Negative.  Fact Sheet for Patients:  EntrepreneurPulse.com.au  Fact Sheet for Healthcare Providers:  IncredibleEmployment.be  This test is no t yet approved or cleared by the [place] FDA and  has been authorized for detection and/or diagnosis of SARS-CoV-2 by FDA under an Emergency Use Authorization (EUA). This EUA will remain  in effect (meaning this test can be used) for  the duration of the COVID-19 declaration under Section 564(b)(1) of the Act, 21 U.S.C.section 360bbb-3(b)(1), unless the authorization is terminated  or revoked sooner.       Influenza A by PCR NEGATIVE NEGATIVE Final   Influenza B by PCR NEGATIVE NEGATIVE Final    Comment: (NOTE) The Xpert Xpress SARS-CoV-2/FLU/RSV plus assay is intended as an aid in the diagnosis of influenza from Nasopharyngeal swab specimens and should not be used as a sole basis for treatment. Nasal washings and aspirates are unacceptable for Xpert Xpress SARS-CoV-2/FLU/RSV testing.  Fact Sheet for Patients: EntrepreneurPulse.com.au  Fact Sheet for Healthcare Providers: IncredibleEmployment.be  This test is not yet approved or cleared by the [place] FDA and has been authorized for detection and/or diagnosis of SARS-CoV-2 by FDA under an Emergency Use Authorization (EUA). This EUA will remain in effect (meaning this test can be used) for the duration of the COVID-19 declaration under Section 564(b)(1) of the Act, 21 U.S.C. section 360bbb-3(b)(1), unless the authorization is terminated or revoked.  Performed at [facility], [place] [address], [place], [place] [address]   Surgical pcr screen     Status: None   Collection Time: 06/13/21 10:14 AM   Specimen: Nasal Mucosa; Nasal Swab  Result Value Ref Range Status   MRSA, PCR NEGATIVE NEGATIVE Final   Staphylococcus aureus NEGATIVE NEGATIVE Final    Comment: (NOTE) The Xpert SA Assay (FDA approved for NASAL specimens in patients 99 years of age and older), is one component of a comprehensive surveillance program. It is not intended to diagnose infection nor to guide or monitor treatment. Performed at [facility], [place] [address], [place], [place] [address]   SARS CORONAVIRUS 2 (TAT 6-24 HRS) Nasopharyngeal Nasopharyngeal Swab     Status: None   Collection Time: 06/21/21  5:40 PM    Specimen: Nasopharyngeal Swab  Result Value Ref Range Status   SARS Coronavirus 2 NEGATIVE NEGATIVE Final    Comment: (NOTE) SARS-CoV-2 target nucleic acids are NOT DETECTED.  The SARS-CoV-2 RNA is generally detectable in upper and lower respiratory specimens during the acute phase of infection. Negative results do not preclude SARS-CoV-2 infection, do not rule out co-infections with other pathogens, and should not be used as the sole basis for treatment or other patient management decisions. Negative results must be combined with clinical observations, patient history, and epidemiological information. The expected result is Negative.  Fact Sheet for Patients: SugarRoll.be  Fact Sheet for Healthcare Providers: https://www.woods-mathews.com/  This test is not yet approved or cleared by the [place] FDA and  has been authorized for detection and/or diagnosis of SARS-CoV-2 by FDA under an Emergency Use Authorization (EUA). This EUA will remain  in  effect (meaning this test can be used) for the duration of the COVID-19 declaration under Se ction 564(b)(1) of the Act, 21 U.S.C. section 360bbb-3(b)(1), unless the authorization is terminated or revoked sooner.  Performed at [facility], [place] [address], [place], [place] [address]          Radiology Studies: DG HIP PORT UNILAT WITH PELVIS 1V RIGHT  Result Date: 06/20/2021 CLINICAL DATA:  Status post fall. EXAM: DG HIP (WITH OR WITHOUT PELVIS) 1V PORT RIGHT COMPARISON:  None. FINDINGS: A total right hip replacement is seen without evidence of surrounding lucency to suggest the presence of hardware loosening or infection. There is no evidence of acute fracture or dislocation. Small calcified uterine fibroids are suspected. IMPRESSION: Total right hip replacement without evidence of hardware loosening or infection. Electronically Signed   By: Virgina Norfolk M.D.   On: 06/20/2021  19:29        Scheduled Meds:  acetaminophen  500 mg Oral TID   aspirin  325 mg Oral Daily   Chlorhexidine Gluconate Cloth  6 each Topical Daily   enoxaparin (LOVENOX) injection  40 mg Subcutaneous Q24H   escitalopram  20 mg Oral Daily   feeding supplement  237 mL Oral BID BM   melatonin  3 mg Oral QHS   metoprolol tartrate  12.5 mg Oral BID   multivitamin with minerals  1 tablet Oral Daily   senna-docusate  1 tablet Oral BID   Continuous Infusions:   LOS: 10 days    Time spent: 35 minutes.     Elmarie Shiley, MD [facility]   If 7PM-7AM, please contact night-coverage [email or web address]  06/22/2021, 3:20 PM

## 2021-06-22 NOTE — Progress Notes (Signed)
Physical Therapy Treatment Patient Details Name: Christine Christensen MRN: [id number] DOB: 07/09/1946 Today's Date: 06/22/2021   History of Present Illness Pt is a 75 y.o. female admitted 06/12/21 with fall at home; found to have R femoral neck fx. Sx delayed as pt deemed high risk; pre-op cardiology evaluation for near syncope, mitral stenosis. S/p R hip hemiarthroplasty on 9/27. Pt guarding R arm post-op; RUE imaging negative for acute injury. PMH includes vascular dementia, HTN, CVAs with residual R-side weakness (2005, 2008), Meniere's disease, bilateral TKAs.    PT Comments    Pt eager to get out of bed with therapy today. Pt continues to have no recall of precautions at beginning of session nor after education during session. Pt requires min A for bed mobility, and modA for transfers and ambulation with RW. Assist with ambulation for maintaining precautions with weightbearing. D/c plans remain appropriate at this time. PT will continue to follow acutely.    Recommendations for follow up therapy are one component of a multi-disciplinary discharge planning process, led by the attending physician.  Recommendations may be updated based on patient status, additional functional criteria and insurance authorization.  Follow Up Recommendations  SNF     Equipment Recommendations  Other (comment) (defer to post acute)       Precautions / Restrictions Precautions Precautions: Fall;Posterior Hip Precaution Booklet Issued: Yes (comment) Precaution Comments: patient is unable to recall any posterior hip precautions Required Braces or Orthoses: Other Brace Other Brace: abduction wedge Restrictions Weight Bearing Restrictions: Yes RLE Weight Bearing: Weight bearing as tolerated Other Position/Activity Restrictions: posterior hip precautions RLE     Mobility  Bed Mobility Overal bed mobility: Needs Assistance Bed Mobility: Supine to Sit     Supine to sit: HOB elevated;Min assist     General  bed mobility comments: pt able to initiate R LE movement however requires min A for completion of serial movement across bed and bringing foot to floor, pt able to manage L LE and with increased effort bring trunk to upright and scoot to EoB    Transfers Overall transfer level: Needs assistance Equipment used: Rolling walker (2 wheeled) Transfers: Sit to/from [facility] Sit to Stand: Mod assist;From elevated surface         General transfer comment: modA for power up and steadying in RW, multimodal cues for safe hand placement  Ambulation/Gait Ambulation/Gait assistance: Mod assist Gait Distance (Feet): 3 Feet Assistive device: Rolling walker (2 wheeled) Gait Pattern/deviations: Step-to pattern;Step-through pattern;Decreased step length - right;Decreased step length - left;Decreased stance time - right;Decreased stride length Gait velocity: decreased Gait velocity interpretation: <1.31 ft/sec, indicative of household ambulator General Gait Details: mod assist provided to R LE to reducing hip IR and Adduction, multimodal cuing for maintaining precautions, pt with increasing fatigue with distance         Balance Overall balance assessment: Needs assistance Sitting-balance support: Bilateral upper extremity supported Sitting balance-Leahy Scale: Fair Sitting balance - Comments: minG   Standing balance support: Bilateral upper extremity supported;During functional activity Standing balance-Leahy Scale: Poor Standing balance comment: BUE support to maintain plus modA                            Cognition Arousal/Alertness: Awake/alert Behavior During Therapy: WFL for tasks assessed/performed Overall Cognitive Status: History of cognitive impairments - at baseline Area of Impairment: Following commands;Safety/judgement;Awareness;Problem solving  Memory: Decreased recall of precautions Following Commands: Follows one step  commands with increased time;Follows one step commands inconsistently Safety/Judgement: Decreased awareness of safety;Decreased awareness of deficits Awareness: Intellectual Problem Solving: Decreased initiation;Requires verbal cues;Slow processing General Comments: pt with decreased response to questions, and difficulty with word finding, unable to recall precautions      Exercises Total Joint Exercises Ankle Circles/Pumps: AAROM;Right;15 reps;Supine;Seated;AROM;Left Quad Sets: AAROM;Right;10 reps;Supine;AROM;Left Long Arc Quad: AAROM;Right;10 reps;Seated;AROM;Left Other Exercises Other Exercises: R heel cord stretch/assessment Other Exercises: R hamstring stretch, able to acheive nearly full knee extension but unablet to maintain    General Comments General comments (skin integrity, edema, etc.): VSS on RA, pt asking where sons are      Pertinent Vitals/Pain Pain Assessment: Faces Faces Pain Scale: Hurts little more Pain Location: R LE at hip Pain Descriptors / Indicators: Grimacing;Guarding;Discomfort Pain Intervention(s): Limited activity within patient's tolerance;Monitored during session;Repositioned     PT Goals (current goals can now be found in the care plan section) Acute Rehab PT Goals Patient Stated Goal: get stronger PT Goal Formulation: With patient/family Time For Goal Achievement: 06/29/21 Potential to Achieve Goals: Fair Progress towards PT goals: Progressing toward goals    Frequency    Min 3X/week      PT Plan Current plan remains appropriate       AM-PAC PT "6 Clicks" Mobility   Outcome Measure  Help needed turning from your back to your side while in a flat bed without using bedrails?: A Lot Help needed moving from lying on your back to sitting on the side of a flat bed without using bedrails?: A Lot Help needed moving to and from a bed to a chair (including a wheelchair)?: A Lot Help needed standing up from a chair using your arms (e.g.,  wheelchair or bedside chair)?: A Lot Help needed to walk in hospital room?: A Lot Help needed climbing 3-5 steps with a railing? : Total 6 Click Score: 11    End of Session Equipment Utilized During Treatment: Gait belt Activity Tolerance: Patient tolerated treatment well;Patient limited by pain Patient left: in chair;with call bell/phone within reach;with chair alarm set;with family/visitor present Nurse Communication: Mobility status (need PRAFO) PT Visit Diagnosis: Other abnormalities of gait and mobility (R26.89);Muscle weakness (generalized) (M62.81);Pain Pain - Right/Left: Right Pain - part of body: Leg     Time: 6948-5462 PT Time Calculation (min) (ACUTE ONLY): 29 min  Charges:  $Gait Training: 8-22 mins $Therapeutic Exercise: 8-22 mins                     Elizandro Laura B. Migdalia Dk PT, DPT Acute Rehabilitation Services Pager [phone] Office [phone]    [place] 06/22/2021, 10:52 AM

## 2021-06-23 DIAGNOSIS — F03918 Unspecified dementia, unspecified severity, with other behavioral disturbance: Secondary | ICD-10-CM | POA: Diagnosis not present

## 2021-06-23 DIAGNOSIS — S72001A Fracture of unspecified part of neck of right femur, initial encounter for closed fracture: Secondary | ICD-10-CM | POA: Diagnosis not present

## 2021-06-23 DIAGNOSIS — W19XXXA Unspecified fall, initial encounter: Secondary | ICD-10-CM | POA: Diagnosis not present

## 2021-06-23 LAB — URINALYSIS, COMPLETE (UACMP) WITH MICROSCOPIC
Bacteria, UA: NONE SEEN
Bilirubin Urine: NEGATIVE
Glucose, UA: NEGATIVE mg/dL
Hgb urine dipstick: NEGATIVE
Ketones, ur: NEGATIVE mg/dL
Leukocytes,Ua: NEGATIVE
Nitrite: NEGATIVE
Protein, ur: NEGATIVE mg/dL
Specific Gravity, Urine: 1.017 (ref 1.005–1.030)
pH: 6 (ref 5.0–8.0)

## 2021-06-23 MED ORDER — IBUPROFEN 200 MG PO TABS
200.0000 mg | ORAL_TABLET | Freq: Four times a day (QID) | ORAL | Status: DC | PRN
  Administered 2021-06-23 (×2): 200 mg via ORAL
  Filled 2021-06-23 (×2): qty 1

## 2021-06-23 NOTE — TOC Progression Note (Addendum)
Transition of Care [facility]) - Progression Note    Patient Details  Name: Christine Christensen MRN: [id number] Date of Birth: 06/12/1946  Transition of Care [facility] CM/SW Contact  Reece Agar, [place] Phone Number: 06/23/2021, 10:34 AM  Clinical Narrative:    Pt niece from [place], she wanted to know if CSW could help pt with medicaid application. CSW informed pt niece that pt financial counseling could help with pt application, CSW will e-mail [facility] for services.   CSW contacted pt niece to inform her of pt stay and getting a facility choice to DC because pt stay is not covered after being medically stable with DC and facility options. Pt family denied [facility] no [facility] able to [facility] pt., the last option is [facility]. CSW explained this to pt niece and she was agreeable that pt should go to [facility] but wanted to check with pt son first.  Pt son and niece contacted CSW on the phone and CSW clarified again that since they gave up their bed at [facility] and there are no beds available that is not an option, [facility] is no longer willing to accept pt and [facility] is willing to accept pt with behaviors they do not have many other options. CSW contacted Juliann Pulse at [facility] to confirm a bed and [facility]n Pulse availability but will wait on family decision.   Family has decided to not send pt to [facility] but would like to send the pt outside of [place] or [place] area.        Expected Discharge Plan and Services                                                 Social Determinants of Health (SDOH) Interventions    Readmission Risk Interventions No flowsheet data found.

## 2021-06-23 NOTE — Progress Notes (Signed)
PROGRESS NOTE    Christine Christensen  WIO:[id number] DOB: 1945-10-09 DOA: 06/12/2021 PCP: Nolene Ebbs, MD   Brief Narrative: 75 year old with past medical history significant for CVA with right-sided weakness, hypertension, vascular dementia, Mnire's disease, moderate mitral stenosis who presents after sudden fall in her kitchen and found to have a right femoral neck fracture.  Patient underwent right hip hemiarthroplasty on 9/27 after undergoing cardiac evaluation and taking risk of surgery.  Patient hospital course complicated by confusion and hypoactive delirium and was a slowly to improve.  Patient became more confused and agitated overnight 10/3, she required a dose of Ativan.  She was evaluated by psych who recommended delirium precaution and to hold Remeron. She Has improved, she is more calm.  Awaiting skilled nursing facility for rehab   Assessment & Plan:   Active Problems:   Closed right hip fracture, initial encounter ([place])   Hip fracture ([facility])   Protein-calorie malnutrition, severe   Pressure injury of skin   Delirium due to another medical condition   Dementia with behavioral disturbance   1-Close traumatic right hip fracture: Right hip hemiarthroplasty 9/27 -Weightbearing as tolerated. -Plan for skilled nursing facility. Tylenol as needed for pain Need Lovenox for DVT prophylaxis for 4 weeks Ortho following  2-Severe calcific mitral valve stenosis with severe pulmonary hypertension and mild aortic valve stenosis: Evaluated by cardiology who recommended conservative management. Patient was a started on low-dose beta-blocker.  3-History of stroke, Right Hemiparesis  -On aspirin.  -CT head without acute finding.  Essential HTN; On metoprolol.   Acute Metabolic Encephalopathy: in setting of vascular dementia. Delirium post op, medication, anesthesia.  Delirium precaution.  Patient initially had hypoactive delirium, opioid and Remeron discontinued.  Subsequently  she had an episode of agitation.  Received a dose of Ativan. -Patient B12 normal. -UA negative.  -Evaluated by psych Remeron held, Seroquel PRN for agitation.  Stable.      Pressure Injury 06/16/21 Buttocks Left Stage 2 -  Partial thickness loss of dermis presenting as a shallow open injury with a red, pink wound bed without slough. approx 18mm (Active)  06/16/21 0000  Location: Buttocks  Location Orientation: Left  Staging: Stage 2 -  Partial thickness loss of dermis presenting as a shallow open injury with a red, pink wound bed without slough.  Wound Description (Comments): approx 108mm  Present on Admission: No     Nutrition Problem: Severe Malnutrition Etiology: chronic illness (dementia and CVA)    Signs/Symptoms: severe fat depletion, severe muscle depletion    Interventions: Ensure Enlive (each supplement provides 350kcal and 20 grams of protein), Liberalize Diet  Estimated body mass index is 20.7 kg/m as calculated from the following:   Height as of this encounter: 5\' 3"  (1.6 m).   Weight as of this encounter: 53 kg.   DVT prophylaxis: Lovenox Code Status: Full code Family Communication: son over phone.  Disposition Plan:  Status is: Inpatient  Remains inpatient appropriate because:Inpatient level of care appropriate due to severity of illness  Dispo: The patient is from: Home              Anticipated d/c is to: SNF              Patient currently is medically stable to d/c.   Difficult to place patient No        Consultants:  Ortho Psych  Procedures:  Hip Sx  Antimicrobials:    Subjective: She is alert, mild hip pain,. Calm, pleasant.  Objective: Vitals:   06/23/21 0347 06/23/21 0400 06/23/21 0742 06/23/21 1130  BP: 107/64 113/61 91/60 124/64  Pulse: 93 88 88 88  Resp: 20  20 20   Temp: 98.5 F (36.9 C)  98.6 F (37 C) 98.5 F (36.9 C)  TempSrc: Oral  Oral Oral  SpO2: 100% 98% 98% 98%  Weight:      Height:        Intake/Output  Summary (Last 24 hours) at 06/23/2021 1529 Last data filed at 06/23/2021 1300 Gross per 24 hour  Intake 600 ml  Output 200 ml  Net 400 ml    Filed Weights   06/12/21 1709 06/13/21 1912  Weight: 53.1 kg 53 kg    Examination:  General exam: NAD Respiratory system: CTA Cardiovascular system: S 1, S 2 RRR Gastrointestinal system: BS present, soft, nt Central nervous system: alert, follows command Extremities: No edema  Data Reviewed: I have personally reviewed following labs and imaging studies  CBC: Recent Labs  Lab 06/17/21 0044 06/21/21 1034  WBC 7.9 7.4  NEUTROABS 5.1 4.5  HGB 10.6* 10.7*  HCT 31.1* 32.5*  MCV 87.4 87.1  PLT 192 341    Basic Metabolic Panel: Recent Labs  Lab 06/17/21 0044 06/21/21 1034  NA 139 136  K 3.6 4.2  CL 103 101  CO2 27 27  GLUCOSE 92 94  BUN 5* 11  CREATININE 0.58 0.57  CALCIUM 8.7* 9.1  MG 1.6* 1.8  PHOS 2.8 4.1    GFR: Estimated Creatinine Clearance: 50.3 mL/min (by C-G formula based on SCr of 0.57 mg/dL). Liver Function Tests: Recent Labs  Lab 06/17/21 0044 06/21/21 1034  AST 17 20  ALT 12 16  ALKPHOS 64 63  BILITOT 0.7 0.5  PROT 5.6* 6.1*  ALBUMIN 2.5* 2.6*    No results for input(s): LIPASE, AMYLASE in the last 168 hours. No results for input(s): AMMONIA in the last 168 hours. Coagulation Profile: No results for input(s): INR, PROTIME in the last 168 hours. Cardiac Enzymes: No results for input(s): CKTOTAL, CKMB, CKMBINDEX, TROPONINI in the last 168 hours. BNP (last 3 results) No results for input(s): PROBNP in the last 8760 hours. HbA1C: No results for input(s): HGBA1C in the last 72 hours. CBG: No results for input(s): GLUCAP in the last 168 hours. Lipid Profile: No results for input(s): CHOL, HDL, LDLCALC, TRIG, CHOLHDL, LDLDIRECT in the last 72 hours. Thyroid Function Tests: Recent Labs    06/21/21 1034  TSH 0.835    Anemia Panel: Recent Labs    06/21/21 1034  VITAMINB12 546    Sepsis  Labs: No results for input(s): PROCALCITON, LATICACIDVEN in the last 168 hours.  Recent Results (from the past 240 hour(s))  SARS CORONAVIRUS 2 (TAT 6-24 HRS) Nasopharyngeal Nasopharyngeal Swab     Status: None   Collection Time: 06/21/21  5:40 PM   Specimen: Nasopharyngeal Swab  Result Value Ref Range Status   SARS Coronavirus 2 NEGATIVE NEGATIVE Final    Comment: (NOTE) SARS-CoV-2 target nucleic acids are NOT DETECTED.  The SARS-CoV-2 RNA is generally detectable in upper and lower respiratory specimens during the acute phase of infection. Negative results do not preclude SARS-CoV-2 infection, do not rule out co-infections with other pathogens, and should not be used as the sole basis for treatment or other patient management decisions. Negative results must be combined with clinical observations, patient history, and epidemiological information. The expected result is Negative.  Fact Sheet for Patients: SugarRoll.be  Fact Sheet for Healthcare Providers: https://www.woods-mathews.com/  This test is not yet approved or cleared by the [place] and  has been authorized for detection and/or diagnosis of SARS-CoV-2 by FDA under an Emergency Use Authorization (EUA). This EUA will remain  in effect (meaning this test can be used) for the duration of the COVID-19 declaration under Se ction 564(b)(1) of the Act, 21 U.S.C. section 360bbb-3(b)(1), unless the authorization is terminated or revoked sooner.  Performed at [facility], [place] [address] [place], [place] [address]           Radiology Studies: No results found.      Scheduled Meds:  acetaminophen  500 mg Oral TID   aspirin  325 mg Oral Daily   Chlorhexidine Gluconate Cloth  6 each Topical Daily   enoxaparin (LOVENOX) injection  40 mg Subcutaneous Q24H   escitalopram  20 mg Oral Daily   feeding supplement  237 mL Oral BID BM   melatonin  3 mg Oral QHS    metoprolol tartrate  12.5 mg Oral BID   multivitamin with minerals  1 tablet Oral Daily   senna-docusate  1 tablet Oral BID   Continuous Infusions:   LOS: 11 days    Time spent: 35 minutes.     Elmarie Shiley, MD [facility]   If 7PM-7AM, please contact night-coverage [email or web address]  06/23/2021, 3:29 PM

## 2021-06-23 NOTE — Consult Note (Signed)
[facility] Face-to-Face Psychiatry Consult   Reason for Consult:   Delirium Referring Physician:   Barb Merino, MD Patient Identification: Christine Christensen MRN:  [id number] Principal Diagnosis: <principal problem not specified> Diagnosis:  Active Problems:   Closed right hip fracture, initial encounter ([place])   Hip fracture ([place])   Protein-calorie malnutrition, severe   Pressure injury of skin   Delirium due to another medical condition   Dementia with behavioral disturbance   Total Time spent with patient: 20 minutes Assessment  Christine Christensen is a 75 y.o. 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 Psychiatry was consulted for concern for delirium.  Outpatient psychotropic medications include Lexapro 20mg  and Remeron 7.5mg  for Anxiety, unspecified disorder and historically he has had a positive response to these medications. She was compliant with medications prior to admission as evidenced by chart review and family who live with patient. On initial examination, patient was appropriate behavior and partially oriented. We plan to continued patient's Lexapro and continued delirium protocol. We have updated PRN medications and recommended workup for common and reversible causes of delirium.    10/6: RN continues to have a difficult time getting specimen for UA as patient refuses Purwick due to discomfort.  However, patient mental status continues to improve and patient does not appear septic or endorse any symptoms of a UTI.  Patient attention span continues to improve, patient did not require much repetition or clarification regarding questions for her memory assessment.  Patient also attempted memory  related tasks with more persistence today.  Patient orientation also improves, patient was oriented x4 as well as to time of day.  Patient appeared more insightful regarding her health, patient was concerned that she was not able to count backwards from 42-27 today.  Patient overall behavior appears closer to her reported baseline.  Patient is not requiring any as needed's for agitation.  Patient's improvement in recall and concentration aligned with concern for delirium 2/2 hospitalization and recent surgery.  This is not unexpected as patient recently broke her hip and had surgery. Patient had a low threshold for insults to her cognitive function due to her baseline vascular dementia. It is likely that with each insult to patient's cognitive function her overall baseline functioning will decrease and some of her new confusion will become chronic. At this time is is best to minimize insult and maximize delirium precautions.    Delirium Hx of Advanced Vascular dementia   Medications Standing - Continue home Lexparo 20mg  daily - Continue to hold home Remeron 7.5mg , attempting to minimize serotonergic medications -Continue melatonin 3 mg    PRN -Continue PRN Agitation protocol: Seroquel 12.5mg  q6h PRN or IM Zyprexa 2.5mg  IM q6h if refuses PO. Avoid unopposed BZD     - Recommend scheduled pain medication with fixed times to decrease chance of night time awakenings - Recommend scheduled bowel regimen   Other recommendations -Continue scheduled pain medication with fixed times to decrease chance of night time awakenings -Continue scheduled bowel regimen - Continue Delirium protocols: have also advised family to continue to speak with patient, keep patient alert during the day and comfortable   Labs             - repeat labs (CBC, CMP,  TSH, B12) largely wnl most signifcant for low albumin   Reviewed EKG; most recent Qtc 441 9/25   ## Safety and Observation Level:  - Based on my clinical  evaluation, I estimate the patient to be at low risk of self harm in the current setting - At this time, we recommend a standard level of observation. This decision is based on my review of the chart including patient's history and current presentation, interview of the patient, mental status examination, and consideration of suicide risk including evaluating suicidal ideation, plan, intent, suicidal or self-harm behaviors, risk factors, and protective factors. This judgment is based on our ability to directly address suicide risk, implement suicide prevention strategies and develop a safety plan while the patient is in the clinical setting. Please contact our team if there is a concern that risk level has changed.   Delirium recs 1. Avoid benzodiazepines, antihistamines, anticholinergics, and minimize opiate use as these may worsen delirium. 2: Assess, prevent and manage pain as lack of treatment can result in delirium.  3: Provide appropriate lighting and clear signage; a clock and calendar should be easily visible to the patient. 4: Monitor environmental factors. Reduce light and noise at night (close shades, turn off lights, turn off TV, ect). Correct any alterations in sleep cycle. 5: Reorient the patient to person, place, time and situation on each encounter.  6: Correct sensory deficits if possible (replace eye glasses, hearing aids, ect). 7: Avoid restraints if able. Severely delirious patients benefit from constant observation by a sitter.        Dispo: per primary team Psychiatry consult will sign off of this patient.  Thank you for this consult.   Subjective:   Christine Christensen is a 75 y.o. female patient admitted with R  hip fracture after a fall with PPH of Advanced Vascular dementia and Anxiety .  HPI:  Christine Christensen a 75 y.o. female patient admitted with R  hip fracture after a fall with PPH of Advanced Vascular dementia and Anxiety . Patient was reported to have sustained R  hip fracture after a fall in her kitchen when attempting to switch from cane to rolling walker.   On assessment today patient appears pleasant.  Overnight patient did not require any as needed's for agitation.  RN reports that patient has a good appetite every other day.  RN reports that patient refuses to wear Purwick due to discomfort but is doing well with adult diapers. Patient's son not in the room today.  However, upon questioning patient she reports that she is aware that her son "[name]" has been the son visiting her throughout her hospitalization.  Patient reports that [name] normally lives with her.  Is oriented x4 today and is also oriented to the time of day.  Patient reports that she feels she is doing "okay" and that she slept "fair".  Patient denies SI, HI and AVH.    Past Medical History:  Past Medical History:  Diagnosis Date  . Anemia    history of anemia  . Aortic stenosis    mild aortic stenosis 07/27/17 echo  . Arthritis   . Heart murmur   . Hypertension   . Mitral stenosis    moderate mitral stenosis 07/27/17 echo  . Stroke [facility])    stroke x2, 2005 and 2008, some right sided weakness  . Syncope and collapse 10/25/2017  . Urinary incontinence     Past Surgical History:  Procedure Laterality Date  . CATARACT EXTRACTION,  BILATERAL Bilateral   . CHOLECYSTECTOMY    . HIP ARTHROPLASTY Right 06/14/2021   Procedure: ARTHROPLASTY BIPOLAR HIP (HEMIARTHROPLASTY);  Surgeon: Willaim Sheng, MD;  Location: [place];  Service: Orthopedics;  Laterality: Right;  . TONSILLECTOMY    . TOTAL KNEE ARTHROPLASTY Right    right knee 2006  . TOTAL KNEE ARTHROPLASTY Left 08/06/2017   Procedure: LEFT TOTAL KNEE ARTHROPLASTY;  Surgeon: Vickey Huger, MD;  Location: [place];  Service: Orthopedics;  Laterality: Left;  . TUBAL LIGATION     Family History:  Family History  Problem Relation Age of Onset  . Dementia Mother   . Heart disease Father   . Cerebral palsy Sister     Social  History:  Social History   Substance and Sexual Activity  Alcohol Use Yes   Comment: Sometimes     Social History   Substance and Sexual Activity  Drug Use Yes  . Types: Marijuana    Social History   Socioeconomic History  . Marital status: Divorced    Spouse name: Not on file  . Number of children: 2  . Years of education: 74  . Highest education level: Not on file  Occupational History  . Not on file  Tobacco Use  . Smoking status: Every Day    Packs/day: 0.50    Years: 56.00    Pack years: 28.00    Types: Cigarettes  . Smokeless tobacco: Never  Vaping Use  . Vaping Use: Never used  Substance and Sexual Activity  . Alcohol use: Yes    Comment: Sometimes  . Drug use: Yes    Types: Marijuana  . Sexual activity: Not on file  Other Topics Concern  . Not on file  Social History Narrative   Lives with son   Caffeine use: Drinks coffee 3 times weekly, Iced tea sometimes   Right handed    Social Determinants of Health   Financial Resource Strain: Not on file  Food Insecurity: Not on file  Transportation Needs: Not on file  Physical Activity: Not on file  Stress: Not on file  Social Connections: Not on file   Additional Social History:    Allergies:  No Known Allergies  Labs:  Results for orders placed or performed during the hospital encounter of 06/12/21 (from the past 48 hour(s))  CBC with Differential/Platelet     Status: Abnormal   Collection Time: 06/21/21 10:34 AM  Result Value Ref Range   WBC 7.4 4.0 - 10.5 K/uL   RBC 3.73 (L) 3.87 - 5.11 MIL/uL   Hemoglobin 10.7 (L) 12.0 - 15.0 g/dL   HCT 32.5 (L) 36.0 - 46.0 %   MCV 87.1 80.0 - 100.0 fL   MCH 28.7 26.0 - 34.0 pg   MCHC 32.9 30.0 - 36.0 g/dL   RDW 14.6 11.5 - 15.5 %   Platelets 366 150 - 400 K/uL   nRBC 0.0 0.0 - 0.2 %   Neutrophils Relative % 61 %   Neutro Abs 4.5 1.7 - 7.7 K/uL   Lymphocytes Relative 27 %   Lymphs Abs 2.0 0.7 - 4.0 K/uL   Monocytes Relative 9 %   Monocytes Absolute  0.6 0.1 - 1.0 K/uL   Eosinophils Relative 2 %   Eosinophils Absolute 0.2 0.0 - 0.5 K/uL   Basophils Relative 1 %   Basophils Absolute 0.1 0.0 - 0.1 K/uL   Immature Granulocytes 0 %   Abs Immature Granulocytes 0.03 0.00 - 0.07 K/uL  Comment: Performed at [facility], [place] [address] [place], [place] [address]  Comprehensive metabolic panel     Status: Abnormal   Collection Time: 06/21/21 10:34 AM  Result Value Ref Range   Sodium 136 135 - 145 mmol/L   Potassium 4.2 3.5 - 5.1 mmol/L   Chloride 101 98 - 111 mmol/L   CO2 27 22 - 32 mmol/L   Glucose, Bld 94 70 - 99 mg/dL    Comment: Glucose reference range applies only to samples taken after fasting for at least 8 hours.   BUN 11 8 - 23 mg/dL   Creatinine, Ser 0.57 0.44 - 1.00 mg/dL   Calcium 9.1 8.9 - 10.3 mg/dL   Total Protein 6.1 (L) 6.5 - 8.1 g/dL   Albumin 2.6 (L) 3.5 - 5.0 g/dL   AST 20 15 - 41 U/L   ALT 16 0 - 44 U/L   Alkaline Phosphatase 63 38 - 126 U/L   Total Bilirubin 0.5 0.3 - 1.2 mg/dL   GFR, Estimated >60 >60 mL/min    Comment: (NOTE) Calculated using the CKD-EPI Creatinine Equation (2021)    Anion gap 8 5 - 15    Comment: Performed at [facility], [place] [address] [place], [place] [address]  Magnesium     Status: None   Collection Time: 06/21/21 10:34 AM  Result Value Ref Range   Magnesium 1.8 1.7 - 2.4 mg/dL    Comment: Performed at [facility] [address] [place], [place] [address]  Phosphorus     Status: None   Collection Time: 06/21/21 10:34 AM  Result Value Ref Range   Phosphorus 4.1 2.5 - 4.6 mg/dL    Comment: Performed at [facility] [address], [place], [place] [address]  TSH     Status: None   Collection Time: 06/21/21 10:34 AM  Result Value Ref Range   TSH 0.835 0.350 - 4.500 uIU/mL    Comment: Performed by a 3rd Generation assay with a functional sensitivity of <=0.01 uIU/mL. Performed at [facility], [place] [address] [place],  [address]    Vitamin B12     Status: None   Collection Time: 06/21/21 10:34 AM  Result Value Ref Range   Vitamin B-12 546 180 - 914 pg/mL    Comment: (NOTE) This assay is not validated for testing neonatal or myeloproliferative syndrome specimens for Vitamin B12 levels. Performed at [facility], [place] [address], [place], [place] [address]   SARS CORONAVIRUS 2 (TAT 6-24 HRS) Nasopharyngeal Nasopharyngeal Swab     Status: None   Collection Time: 06/21/21  5:40 PM   Specimen: Nasopharyngeal Swab  Result Value Ref Range   SARS Coronavirus 2 NEGATIVE NEGATIVE    Comment: (NOTE) SARS-CoV-2 target nucleic acids are NOT DETECTED.  The SARS-CoV-2 RNA is generally detectable in upper and lower respiratory specimens during the acute phase of infection. Negative results do not preclude SARS-CoV-2 infection, do not rule out co-infections with other pathogens, and should not be used as the sole basis for treatment or other patient management decisions. Negative results must be combined with clinical observations, patient history, and epidemiological information. The expected result is Negative.  Fact Sheet for Patients: SugarRoll.be  Fact Sheet for Healthcare Providers: https://www.woods-mathews.com/  This test is not yet approved or cleared by the [place] FDA and  has been authorized for detection and/or diagnosis of SARS-CoV-2 by FDA under an Emergency Use Authorization (  EUA). This EUA will remain  in effect (meaning this test can be used) for the duration of the COVID-19 declaration under Se ction 564(b)(1) of the Act, 21 U.S.C. section 360bbb-3(b)(1), unless the authorization is terminated or revoked sooner.  Performed at [facility], [place] [address] [place], [place] [address]     Current Facility-Administered Medications  Medication Dose Route Frequency Provider Last Rate Last Admin  . acetaminophen (TYLENOL) tablet 1,000 mg   1,000 mg Oral Q6H PRN Barb Merino, MD   1,000 mg at 06/20/21 1150  . acetaminophen (TYLENOL) tablet 500 mg  500 mg Oral TID Regalado, Belkys A, MD   500 mg at 06/23/21 0951  . aspirin tablet 325 mg  325 mg Oral Daily Willaim Sheng, MD   325 mg at 06/23/21 0953  . bisacodyl (DULCOLAX) EC tablet 5 mg  5 mg Oral Daily PRN Willaim Sheng, MD      . Chlorhexidine Gluconate Cloth 2 % PADS 6 each  6 each Topical Daily Barb Merino, MD   6 each at 06/23/21 0935  . enoxaparin (LOVENOX) injection 40 mg  40 mg Subcutaneous Q24H Willaim Sheng, MD   40 mg at 06/23/21 0950  . escitalopram (LEXAPRO) tablet 20 mg  20 mg Oral Daily Willaim Sheng, MD   20 mg at 06/23/21 0951  . feeding supplement (ENSURE ENLIVE / ENSURE PLUS) liquid 237 mL  237 mL Oral BID BM Barb Merino, MD   237 mL at 06/22/21 1359  . HYDROcodone-acetaminophen (NORCO/VICODIN) 5-325 MG per tablet 1-2 tablet  1-2 tablet Oral Q6H PRN Willaim Sheng, MD   1 tablet at 06/23/21 0405  . melatonin tablet 3 mg  3 mg Oral QHS Damita Dunnings B, MD   3 mg at 06/22/21 2149  . metoprolol tartrate (LOPRESSOR) tablet 12.5 mg  12.5 mg Oral BID Willaim Sheng, MD   12.5 mg at 06/22/21 2148  . multivitamin with minerals tablet 1 tablet  1 tablet Oral Daily Willaim Sheng, MD   1 tablet at 06/23/21 0951  . QUEtiapine (SEROQUEL) tablet 12.5 mg  12.5 mg Oral Q6H PRN Freida Busman, MD       Or  . OLANZapine (ZYPREXA) injection 2.5 mg  2.5 mg Intramuscular Q6H PRN Damita Dunnings B, MD      . senna-docusate (Senokot-S) tablet 1 tablet  1 tablet Oral BID Regalado, Belkys A, MD   1 tablet at 06/23/21 4782    Musculoskeletal: Strength & Muscle Tone: decreased Gait & Station:  Remains in bed on assessment Patient leans: N/A            Psychiatric Specialty Exam:  Presentation  General Appearance: Appropriate for Environment (In hospital gown, underneath blankets, wearing her glasses)  Eye  Contact:Good  Speech:Clear and Coherent  Speech Volume:Normal  Handedness: No data recorded  Mood and Affect  Mood:-- ("Doing okay")  Affect:Congruent   Thought Process  Thought Processes:Coherent  Descriptions of Associations:Circumstantial  Orientation:Full (Time, Place and Person)  Thought Content:Logical  History of Schizophrenia/Schizoaffective disorder:No data recorded Duration of Psychotic Symptoms:No data recorded Hallucinations:Hallucinations: None  Ideas of Reference:None  Suicidal Thoughts:Suicidal Thoughts: No  Homicidal Thoughts:Homicidal Thoughts: No   Sensorium  Memory:Immediate Poor; Recent Poor; Remote Fair  Judgment:-- (Improving)  Insight:Shallow   Executive Functions  Concentration:Fair  Attention Span:Good (Significantly improved from first assessment)  Recall:Poor (But improved from first assessment.  1/3 without prompting.  Was able to get 1 more word  with only 1 hint)  [place]  Language:Good   Psychomotor Activity  Psychomotor Activity:Psychomotor Activity: Decreased   Assets  Assets:Communication Skills; Social Support; Resilience; Housing   Sleep  Sleep:Sleep: Fair   Physical Exam: Physical Exam Eyes:     Extraocular Movements: Extraocular movements intact.  Pulmonary:     Effort: Pulmonary effort is normal.  Neurological:     Mental Status: She is alert and oriented to person, place, and time.   Review of Systems  Psychiatric/Behavioral:  Negative for suicidal ideas.   Blood pressure 91/60, pulse 88, temperature 98.6 F (37 C), temperature source Oral, resp. rate 20, height 5\' 3"  (1.6 m), weight 53 kg, SpO2 98 %. Body mass index is 20.7 kg/m.  PGY-2 Freida Busman, MD 06/23/2021 10:04 AM

## 2021-06-23 NOTE — Progress Notes (Signed)
Occupational Therapy Treatment Patient Details Name: Christine Christensen MRN: [id number] DOB: Jun 02, 1946 Today's Date: 06/23/2021   History of present illness Pt is a 75 y.o. female admitted 06/12/21 with fall at home; found to have R femoral neck fx. Sx delayed as pt deemed high risk; pre-op cardiology evaluation for near syncope, mitral stenosis. S/p R hip hemiarthroplasty on 9/27. Pt guarding R arm post-op; RUE imaging negative for acute injury. PMH includes vascular dementia, HTN, CVAs with residual R-side weakness (2005, 2008), Meniere's disease, bilateral TKAs.   OT comments  Nursing states that they are only giving ibuprofen for pain due to increased confusion with other medication. Patient in bed asking to use bathroom. Patient had difficulty moving RLE due to pain and increased assistance for transfer to [facility].  Patient stood for toilet hygiene but was unable to let go of walker to clean.  Patient performed grooming seated at sink due to requiring BUE for support when standing. Patient was returned to supine for comfort and discussed hip precautions.  Patient was provided handout to review and continues to have difficulty recalling.  Nursing was asked to review precautions with patient to increase carryover. Acute OT to continue to follow.     Recommendations for follow up therapy are one component of a multi-disciplinary discharge planning process, led by the attending physician.  Recommendations may be updated based on patient status, additional functional criteria and insurance authorization.    Follow Up Recommendations  SNF;Supervision/Assistance - 24 hour    Equipment Recommendations  3 in 1 bedside commode;Other (comment)    Recommendations for Other Services      Precautions / Restrictions Precautions Precautions: Fall;Posterior Hip Precaution Booklet Issued: Yes (comment) Precaution Comments: patient is unable to recall hip precautions after review Required Braces or Orthoses:  Other Brace Other Brace: abduction wedge Restrictions Weight Bearing Restrictions: Yes RLE Weight Bearing: Weight bearing as tolerated       Mobility Bed Mobility Overal bed mobility: Needs Assistance Bed Mobility: Supine to Sit;Sit to Supine     Supine to sit: HOB elevated;Min assist Sit to supine: Mod assist   General bed mobility comments: difficulty moving RLE off and on bed    Transfers Overall transfer level: Needs assistance Equipment used: Rolling walker (2 wheeled) Transfers: Sit to/from [facility] Sit to Stand: Min assist;Mod assist;From elevated surface Stand pivot transfers: Mod assist;Min assist       General transfer comment: limited by pain    Balance Overall balance assessment: Needs assistance Sitting-balance support: Bilateral upper extremity supported Sitting balance-Leahy Scale: Fair Sitting balance - Comments: min guard   Standing balance support: Bilateral upper extremity supported;During functional activity Standing balance-Leahy Scale: Poor Standing balance comment: required BUE support when standing                           ADL either performed or assessed with clinical judgement   ADL Overall ADL's : Needs assistance/impaired     Grooming: Wash/dry hands;Wash/dry face;Oral care;Sitting Grooming Details (indicate cue type and reason): performed grooming seated at sink due to hip pain                 Toilet Transfer: Minimal assistance;Moderate assistance;BSC;RW Toilet Transfer Details (indicate cue type and reason): required more assistance due to pain Toileting- Clothing Manipulation and Hygiene: Maximal assistance;Sit to/from stand Toileting - Clothing Manipulation Details (indicate cue type and reason): unable to perform toilet hygiene due to required BUE support  with RW     Functional mobility during ADLs: Minimal assistance;Rolling walker General ADL Comments: able to walk short distances today  due to hip pain     Vision       Perception     Praxis      Cognition Arousal/Alertness: Awake/alert Behavior During Therapy: WFL for tasks assessed/performed Overall Cognitive Status: History of cognitive impairments - at baseline Area of Impairment: Following commands;Safety/judgement;Awareness;Problem solving                     Memory: Decreased recall of precautions Following Commands: Follows one step commands with increased time;Follows one step commands inconsistently Safety/Judgement: Decreased awareness of safety;Decreased awareness of deficits Awareness: Intellectual Problem Solving: Decreased initiation;Requires verbal cues;Slow processing General Comments: oriented x3 but continues to have difficulty recalling hip precautions        Exercises     Shoulder Instructions       General Comments      Pertinent Vitals/ Pain       Pain Assessment: Faces Faces Pain Scale: Hurts even more Pain Location: R LE at hip Pain Descriptors / Indicators: Grimacing;Guarding;Discomfort Pain Intervention(s): Premedicated before session  Home Living                                          Prior Functioning/Environment              Frequency  Min 2X/week        Progress Toward Goals  OT Goals(current goals can now be found in the care plan section)  Progress towards OT goals: Progressing toward goals  Acute Rehab OT Goals Patient Stated Goal: get stronger OT Goal Formulation: With patient Time For Goal Achievement: 06/29/21 Potential to Achieve Goals: Good ADL Goals Pt Will Perform Lower Body Bathing: with mod assist;sit to/from stand;with adaptive equipment Pt Will Perform Lower Body Dressing: with mod assist;with adaptive equipment;sitting/lateral leans;sit to/from stand Pt Will Transfer to Toilet: with mod assist;stand pivot transfer;bedside commode Pt Will Perform Toileting - Clothing Manipulation and hygiene: with mod  assist;sitting/lateral leans;sit to/from stand Additional ADL Goal #1: Pt to verbalize 3/3 posterior hip precautions with min verbal cues during ADLs  Plan Discharge plan remains appropriate    Co-evaluation                 AM-PAC OT "6 Clicks" Daily Activity     Outcome Measure   Help from another person eating meals?: A Little Help from another person taking care of personal grooming?: A Little Help from another person toileting, which includes using toliet, bedpan, or urinal?: A Lot Help from another person bathing (including washing, rinsing, drying)?: A Lot Help from another person to put on and taking off regular upper body clothing?: A Little Help from another person to put on and taking off regular lower body clothing?: A Lot 6 Click Score: 15    End of Session Equipment Utilized During Treatment: Rolling walker;Gait belt  OT Visit Diagnosis: Unsteadiness on feet (R26.81);Other abnormalities of gait and mobility (R26.89);Muscle weakness (generalized) (M62.81);History of falling (Z91.81);Pain Pain - Right/Left: Right Pain - part of body: Hip;Leg   Activity Tolerance Patient limited by pain   Patient Left in bed;with call bell/phone within reach;with bed alarm set   Nurse Communication Mobility status;Other (comment) (discussed hip pain)        Time: 2778-2423 OT Time  Calculation (min): 39 min  Charges: OT General Charges $OT Visit: 1 Visit OT Treatments $Self Care/Home Management : 38-52 mins  Lodema Hong, [place] 06/23/2021, 2:17 PM

## 2021-06-24 DIAGNOSIS — W19XXXA Unspecified fall, initial encounter: Secondary | ICD-10-CM | POA: Diagnosis not present

## 2021-06-24 DIAGNOSIS — S72001A Fracture of unspecified part of neck of right femur, initial encounter for closed fracture: Secondary | ICD-10-CM | POA: Diagnosis not present

## 2021-06-24 LAB — BASIC METABOLIC PANEL
Anion gap: 9 (ref 5–15)
BUN: 18 mg/dL (ref 8–23)
CO2: 25 mmol/L (ref 22–32)
Calcium: 9.3 mg/dL (ref 8.9–10.3)
Chloride: 101 mmol/L (ref 98–111)
Creatinine, Ser: 0.64 mg/dL (ref 0.44–1.00)
GFR, Estimated: >60 mL/min (ref >60)
Glucose, Bld: 111 mg/dL — ABNORMAL HIGH (ref 70–99)
Potassium: 4.2 mmol/L (ref 3.5–5.1)
Sodium: 135 mmol/L (ref 135–145)

## 2021-06-24 NOTE — TOC Progression Note (Addendum)
Transition of Care [facility]) - Progression Note    Patient Details  Name: Christine Christensen MRN: [id number] Date of Birth: October 23, 1945  Transition of Care [facility]) CM/SW [place], [place] Phone Number: 06/24/2021, 12:22 PM  Clinical Narrative:    CSW spoke with pt son Christine Christensen about placement again. Christine Christensen states that he would like to try [facility] and surrounding areas for placement. CSW faxed pt out again and added facilities outside of [place] and [place]. CSW contacted [facility] who does not accept [facility].  CSW contacted [facility] who does take [facility] and they can provide pt a bed after Josem Kaufmann is completed. CSW contacted pt son to confirm bed, there was no answer. CSW left a VM. CSW contacted the home phone of the pt and Christine Christensen, no answer. CSW then attempted to contact pt niece with no answer CSW will follow up over the weekend.  Pt son contacted CSW to confirm pt Dc to [facility] pt pending auth.        Expected Discharge Plan and Services                                                 Social Determinants of Health (SDOH) Interventions    Readmission Risk Interventions No flowsheet data found.

## 2021-06-24 NOTE — Progress Notes (Signed)
Got a call from MSW at [facility] claiming that she has a bed available in [place] [facility].

## 2021-06-24 NOTE — Progress Notes (Signed)
Attempted to get out of bed every now and then. Bed alarm  activated.

## 2021-06-24 NOTE — Progress Notes (Signed)
Pulled out abductor pillow. When attempted to place it back claimed " leave me alone." PT came to see her back, managed to put the abductor pillow back. Made comfortable on bed. Continue to monitor.

## 2021-06-24 NOTE — Progress Notes (Signed)
Physical Therapy Treatment Patient Details Name: Christine Christensen MRN: [id number] DOB: September 30, 1945 Today's Date: 06/24/2021   History of Present Illness Pt is a 75 y.o. female admitted 06/12/21 with fall at home; found to have R femoral neck fx. Sx delayed as pt deemed high risk; pre-op cardiology evaluation for near syncope, mitral stenosis. S/p R hip hemiarthroplasty on 9/27. Pt guarding R arm post-op; RUE imaging negative for acute injury. PMH includes vascular dementia, HTN, CVAs with residual R-side weakness (2005, 2008), Meniere's disease, bilateral TKAs.    PT Comments    The pt was able to make great progress with OOB mobility at this time, ambulating 75 ft of hallway ambulation with minG and use of RW. The pt continues to demo poor functional strength or activation of RLE muscles, especially R ankle DF and eversion and R knee extension. Continue to recommend PRAFO boots to maintain functional position of R foot/ankle while pt lacks ability to actively move/reposition this extremity.     Recommendations for follow up therapy are one component of a multi-disciplinary discharge planning process, led by the attending physician.  Recommendations may be updated based on patient status, additional functional criteria and insurance authorization.  Follow Up Recommendations  SNF     Equipment Recommendations  Other (comment) (defer to post acute)    Recommendations for Other Services       Precautions / Restrictions Precautions Precautions: Fall;Posterior Hip Precaution Booklet Issued: Yes (comment) Precaution Comments: pt able to recall 2/3 (forgetting no internal rotation) Other Brace: abduction wedge Restrictions Weight Bearing Restrictions: Yes RLE Weight Bearing: Weight bearing as tolerated Other Position/Activity Restrictions: posterior hip precautions RLE     Mobility  Bed Mobility Overal bed mobility: Needs Assistance Bed Mobility: Supine to Sit     Supine to sit: Min  assist;Mod assist     General bed mobility comments: minA to BLE, modA to elevate trunk from [place] Overall transfer level: Needs assistance Equipment used: Rolling walker (2 wheeled) Transfers: Sit to/from Stand Sit to Stand: Min assist         General transfer comment: minA with cues for hand placement, cues to bring hands to RW and to control lower to chair  Ambulation/Gait Ambulation/Gait assistance: Min assist;Min guard Gait Distance (Feet): 75 Feet Assistive device: Rolling walker (2 wheeled) Gait Pattern/deviations: Step-to pattern;Step-through pattern;Decreased step length - right;Decreased step length - left;Decreased stance time - right;Decreased stride length Gait velocity: 0.06 m/s Gait velocity interpretation: <1.31 ft/sec, indicative of household ambulator General Gait Details: minA to R knee to reduce hip IR initially, pt with increased hip flexion and placing RLE in front and wide with each step, good attention to foot placement on the ground, no instances of knee buckling.     Balance Overall balance assessment: Needs assistance Sitting-balance support: Bilateral upper extremity supported Sitting balance-Leahy Scale: Fair Sitting balance - Comments: minG for safety, pt leaning backwards at times   Standing balance support: Bilateral upper extremity supported;During functional activity Standing balance-Leahy Scale: Poor Standing balance comment: benefits from BUE support, able to complete short bouts with single UE support, BUE support needed for gait                            Cognition Arousal/Alertness: Awake/alert Behavior During Therapy: WFL for tasks assessed/performed Overall Cognitive Status: History of cognitive impairments - at baseline Area of Impairment: Following commands;Memory;Safety/judgement;Problem solving  Memory: Decreased short-term memory;Decreased recall of precautions Following  Commands: Follows one step commands with increased time;Follows one step commands inconsistently Safety/Judgement: Decreased awareness of safety;Decreased awareness of deficits   Problem Solving: Decreased initiation;Requires verbal cues;Slow processing General Comments: pt oriented and able to answer questions after education, but when prompted later in session or after session pt with noted deficits in [facility] and unable to recall how to get assistance. able to recall 2/3 hip precautions      Exercises Total Joint Exercises Ankle Circles/Pumps: AAROM;Right;15 reps;Supine;Seated;AROM Quad Sets: AAROM;Right;20 reps;Supine;Seated Heel Slides: AAROM;Right;10 reps;Supine Long Arc Quad: AAROM;Right;10 reps;Seated;AROM Other Exercises Other Exercises: R heel cord stretch, 3 x 30 sec Other Exercises: R hamstring stretch, able to acheive nearly full knee extension but unablet to maintain Other Exercises: toe elevation in sitting. pt with limited AROM btu able to complete small ROM against gravity, DF with inversiononly    General Comments General comments (skin integrity, edema, etc.): VSS on RA      Pertinent Vitals/Pain Pain Assessment: No/denies pain Pain Intervention(s): Monitored during session;Limited activity within patient's tolerance;Repositioned     PT Goals (current goals can now be found in the care plan section) Acute Rehab PT Goals Patient Stated Goal: get stronger PT Goal Formulation: With patient/family Time For Goal Achievement: 06/29/21 Potential to Achieve Goals: Fair Progress towards PT goals: Progressing toward goals    Frequency    Min 3X/week      PT Plan Current plan remains appropriate       AM-PAC PT "6 Clicks" Mobility   Outcome Measure  Help needed turning from your back to your side while in a flat bed without using bedrails?: A Lot Help needed moving from lying on your back to sitting on the side of a flat bed without using bedrails?: A Lot Help  needed moving to and from a bed to a chair (including a wheelchair)?: A Little Help needed standing up from a chair using your arms (e.g., wheelchair or bedside chair)?: A Little Help needed to walk in hospital room?: A Little Help needed climbing 3-5 steps with a railing? : Total 6 Click Score: 14    End of Session Equipment Utilized During Treatment: Gait belt Activity Tolerance: Patient tolerated treatment well;Patient limited by pain Patient left: in chair;with call bell/phone within reach;with chair alarm set;with family/visitor present Nurse Communication: Mobility status PT Visit Diagnosis: Other abnormalities of gait and mobility (R26.89);Muscle weakness (generalized) (M62.81);Pain Pain - Right/Left: Right Pain - part of body: Leg     Time: 1400-1444 PT Time Calculation (min) (ACUTE ONLY): 44 min  Charges:  $Gait Training: 23-37 mins $Therapeutic Exercise: 8-22 mins                     [place] Carbo, PT, DPT   Acute Rehabilitation Department Pager #: [phone]   Sandra Cockayne 06/24/2021, 4:01 PM

## 2021-06-24 NOTE — Plan of Care (Signed)
  Problem: Pain Managment: Goal: General experience of comfort will improve Outcome: Progressing   Problem: Safety: Goal: Ability to remain free from injury will improve Outcome: Progressing   

## 2021-06-24 NOTE — Progress Notes (Signed)
PROGRESS NOTE    Christine Christensen  IRC:[id number] DOB: 20-Oct-1945 DOA: 06/12/2021 PCP: Nolene Ebbs, MD   Brief Narrative: 75 year old with past medical history significant for CVA with right-sided weakness, hypertension, vascular dementia, Mnire's disease, moderate mitral stenosis who presents after sudden fall in her kitchen and found to have a right femoral neck fracture.  Patient underwent right hip hemiarthroplasty on 9/27 after undergoing cardiac evaluation and taking risk of surgery.  Patient hospital course complicated by confusion and hypoactive delirium and was a slowly to improve.  Patient became more confused and agitated overnight 10/3, she required a dose of Ativan.  She was evaluated by psych who recommended delirium precaution and to hold Remeron. She Has improved, she is more calm.  Awaiting skilled nursing facility for rehab   Assessment & Plan:   Active Problems:   Closed right hip fracture, initial encounter ([place])   Hip fracture (HCC)   Protein-calorie malnutrition, severe   Pressure injury of skin   Delirium due to another medical condition   Dementia with behavioral disturbance   1-Close traumatic right hip fracture: Right hip hemiarthroplasty 9/27 -Weightbearing as tolerated. -Plan for skilled nursing facility. Tylenol as needed for pain -Need Lovenox for DVT prophylaxis for 4 weeks -Ortho following  2-Severe calcific mitral valve stenosis with severe pulmonary hypertension and mild aortic valve stenosis: Evaluated by cardiology who recommended conservative management. Patient was a started on low-dose beta-blocker.  3-History of stroke, Right Hemiparesis  -On aspirin.  -CT head without acute finding.  Essential HTN; On metoprolol.   Acute Metabolic Encephalopathy: in setting of vascular dementia. Delirium post op, medication, anesthesia.  Delirium precaution.  Patient initially had hypoactive delirium, opioid and Remeron discontinued.  Subsequently  she had an episode of agitation.  Received a dose of Ativan. -Patient B12 normal. -UA negative.  -Evaluated by psych Remeron held, Seroquel PRN for agitation.    Pressure Injury 06/16/21 Buttocks Left Stage 2 -  Partial thickness loss of dermis presenting as a shallow open injury with a red, pink wound bed without slough. approx 67mm (Active)  06/16/21 0000  Location: Buttocks  Location Orientation: Left  Staging: Stage 2 -  Partial thickness loss of dermis presenting as a shallow open injury with a red, pink wound bed without slough.  Wound Description (Comments): approx 29mm  Present on Admission: No     Nutrition Problem: Severe Malnutrition Etiology: chronic illness (dementia and CVA)    Signs/Symptoms: severe fat depletion, severe muscle depletion    Interventions: Ensure Enlive (each supplement provides 350kcal and 20 grams of protein), Liberalize Diet  Estimated body mass index is 20.7 kg/m as calculated from the following:   Height as of this encounter: 5\' 3"  (1.6 m).   Weight as of this encounter: 53 kg.   DVT prophylaxis: Lovenox Code Status: Full code Family Communication: Son over phone.  Disposition Plan:  Status is: Inpatient  Remains inpatient appropriate because:Inpatient level of care appropriate due to severity of illness  Dispo: The patient is from: Home              Anticipated d/c is to: SNF              Patient currently is medically stable to d/c.   Difficult to place patient No        Consultants:  Ortho Psych  Procedures:  Hip Sx  Antimicrobials:    Subjective: She is calm. Alert and oriented times 3. Denies pain.   Objective: Vitals:  06/23/21 2016 06/23/21 2344 06/24/21 0331 06/24/21 0738  BP: 99/79 111/70 105/60 111/72  Pulse: 80 70 77 78  Resp: 16 18 17 17   Temp: 98.6 F (37 C) 98.5 F (36.9 C) 98.4 F (36.9 C) 98.6 F (37 C)  TempSrc: Oral Oral Oral Oral  SpO2: 93% 98% 95% 95%  Weight:      Height:         Intake/Output Summary (Last 24 hours) at 06/24/2021 1259 Last data filed at 06/24/2021 1000 Gross per 24 hour  Intake 917 ml  Output --  Net 917 ml    Filed Weights   06/12/21 1709 06/13/21 1912  Weight: 53.1 kg 53 kg    Examination:  General exam: NAD Respiratory system: CTA Cardiovascular system: S 1, S 2 RRR Gastrointestinal system: BS present, soft, NT Central nervous system: Alert, following command Extremities: no edema  Data Reviewed: I have personally reviewed following labs and imaging studies  CBC: Recent Labs  Lab 06/21/21 1034  WBC 7.4  NEUTROABS 4.5  HGB 10.7*  HCT 32.5*  MCV 87.1  PLT 921    Basic Metabolic Panel: Recent Labs  Lab 06/21/21 1034 06/24/21 0929  NA 136 135  K 4.2 4.2  CL 101 101  CO2 27 25  GLUCOSE 94 111*  BUN 11 18  CREATININE 0.57 0.64  CALCIUM 9.1 9.3  MG 1.8  --   PHOS 4.1  --     GFR: Estimated Creatinine Clearance: 50.3 mL/min (by C-G formula based on SCr of 0.64 mg/dL). Liver Function Tests: Recent Labs  Lab 06/21/21 1034  AST 20  ALT 16  ALKPHOS 63  BILITOT 0.5  PROT 6.1*  ALBUMIN 2.6*    No results for input(s): LIPASE, AMYLASE in the last 168 hours. No results for input(s): AMMONIA in the last 168 hours. Coagulation Profile: No results for input(s): INR, PROTIME in the last 168 hours. Cardiac Enzymes: No results for input(s): CKTOTAL, CKMB, CKMBINDEX, TROPONINI in the last 168 hours. BNP (last 3 results) No results for input(s): PROBNP in the last 8760 hours. HbA1C: No results for input(s): HGBA1C in the last 72 hours. CBG: No results for input(s): GLUCAP in the last 168 hours. Lipid Profile: No results for input(s): CHOL, HDL, LDLCALC, TRIG, CHOLHDL, LDLDIRECT in the last 72 hours. Thyroid Function Tests: No results for input(s): TSH, T4TOTAL, FREET4, T3FREE, THYROIDAB in the last 72 hours.  Anemia Panel: No results for input(s): VITAMINB12, FOLATE, FERRITIN, TIBC, IRON, RETICCTPCT in the  last 72 hours.  Sepsis Labs: No results for input(s): PROCALCITON, LATICACIDVEN in the last 168 hours.  Recent Results (from the past 240 hour(s))  SARS CORONAVIRUS 2 (TAT 6-24 HRS) Nasopharyngeal Nasopharyngeal Swab     Status: None   Collection Time: 06/21/21  5:40 PM   Specimen: Nasopharyngeal Swab  Result Value Ref Range Status   SARS Coronavirus 2 NEGATIVE NEGATIVE Final    Comment: (NOTE) SARS-CoV-2 target nucleic acids are NOT DETECTED.  The SARS-CoV-2 RNA is generally detectable in upper and lower respiratory specimens during the acute phase of infection. Negative results do not preclude SARS-CoV-2 infection, do not rule out co-infections with other pathogens, and should not be used as the sole basis for treatment or other patient management decisions. Negative results must be combined with clinical observations, patient history, and epidemiological information. The expected result is Negative.  Fact Sheet for Patients: SugarRoll.be  Fact Sheet for Healthcare Providers: https://www.woods-mathews.com/  This test is not yet approved or cleared  by the [place] and  has been authorized for detection and/or diagnosis of SARS-CoV-2 by FDA under an Emergency Use Authorization (EUA). This EUA will remain  in effect (meaning this test can be used) for the duration of the COVID-19 declaration under Se ction 564(b)(1) of the Act, 21 U.S.C. section 360bbb-3(b)(1), unless the authorization is terminated or revoked sooner.  Performed at [facility], [place] [address] [place], [place] [address]           Radiology Studies: No results found.      Scheduled Meds:  acetaminophen  500 mg Oral TID   aspirin  325 mg Oral Daily   Chlorhexidine Gluconate Cloth  6 each Topical Daily   enoxaparin (LOVENOX) injection  40 mg Subcutaneous Q24H   escitalopram  20 mg Oral Daily   feeding supplement  237 mL Oral BID BM    melatonin  3 mg Oral QHS   metoprolol tartrate  12.5 mg Oral BID   multivitamin with minerals  1 tablet Oral Daily   senna-docusate  1 tablet Oral BID   Continuous Infusions:   LOS: 12 days    Time spent: 35 minutes.     Elmarie Shiley, MD [facility]   If 7PM-7AM, please contact night-coverage [email or web address]  06/24/2021, 12:59 PM

## 2021-06-25 ENCOUNTER — Inpatient Hospital Stay (HOSPITAL_COMMUNITY): Payer: Medicare (Managed Care)

## 2021-06-25 DIAGNOSIS — W19XXXA Unspecified fall, initial encounter: Secondary | ICD-10-CM | POA: Diagnosis not present

## 2021-06-25 DIAGNOSIS — S72001A Fracture of unspecified part of neck of right femur, initial encounter for closed fracture: Secondary | ICD-10-CM | POA: Diagnosis not present

## 2021-06-25 MED ORDER — SENNOSIDES-DOCUSATE SODIUM 8.6-50 MG PO TABS: 1.0000 | ORAL_TABLET | Freq: Two times a day (BID) | ORAL | 0 refills | Status: DC

## 2021-06-25 MED ORDER — POLYETHYLENE GLYCOL 3350 17 G PO PACK: 17.0000 g | PACK | Freq: Two times a day (BID) | ORAL | 0 refills | Status: DC

## 2021-06-25 MED ORDER — ENOXAPARIN SODIUM 40 MG/0.4ML IJ SOSY: 40.0000 mg | PREFILLED_SYRINGE | INTRAMUSCULAR | 0 refills | Status: DC

## 2021-06-25 MED ORDER — ASPIRIN 325 MG PO TABS: 325.0000 mg | ORAL_TABLET | Freq: Every day | ORAL | 0 refills | Status: DC

## 2021-06-25 MED ORDER — MELATONIN 3 MG PO TABS: 3.0000 mg | ORAL_TABLET | Freq: Every day | ORAL | 0 refills | Status: DC

## 2021-06-25 MED ORDER — POLYETHYLENE GLYCOL 3350 17 G PO PACK
17.0000 g | PACK | Freq: Two times a day (BID) | ORAL | Status: DC
  Administered 2021-06-25 – 2021-06-29 (×7): 17 g via ORAL
  Filled 2021-06-25 (×7): qty 1

## 2021-06-25 MED ORDER — ENSURE ENLIVE PO LIQD: 237.0000 mL | Freq: Two times a day (BID) | ORAL | 12 refills | Status: DC

## 2021-06-25 MED ORDER — METOPROLOL TARTRATE 25 MG PO TABS: 12.5000 mg | ORAL_TABLET | Freq: Two times a day (BID) | ORAL | 0 refills | Status: DC

## 2021-06-25 MED ORDER — ACETAMINOPHEN 500 MG PO TABS: 1000.0000 mg | ORAL_TABLET | Freq: Four times a day (QID) | ORAL | 0 refills | Status: DC | PRN

## 2021-06-25 MED ORDER — ADULT MULTIVITAMIN W/MINERALS CH: 1.0000 | ORAL_TABLET | Freq: Every day | ORAL | 0 refills | Status: DC

## 2021-06-25 NOTE — Progress Notes (Signed)
Notified by RN that son is at bedside and would like to speak to M.D. Went to bedside. Had 15 minute discussion with son concerning Ms. Presas and his concerns. He is concerned about the delirium and agitation she is having for past few days. He does not want her restrained if it can be avoided. If it is only way to ensure safety then he would understand. He is concerned about using antipsychotic medications and would like to avoid them. He states he would like to know that Ms. Soh has not had another stroke with her increased agitation and delirium the past few days. He states he would feel better if he knew that stroke or organic brain insult has not occurred. She has history of two previous strokes.  Ms. Appleyard is laying in bed and calm, comfortable at this time.   CT head ordered to evaluate for acute intracranial pathology due to family concerns.

## 2021-06-25 NOTE — Progress Notes (Signed)
After pt's son left around 2.30, pt started to become agitated after 3 pm,  Zyprexa 2.5 mg IM given. After medicine pt still some agitated, I tried to call her son with unsuccessful efforts couple of times and left voice message for son to call back.  Will continue to monitor the patient   Palma Holter, RN

## 2021-06-25 NOTE — TOC Progression Note (Signed)
Transition of Care [facility]) - Progression Note    Patient Details  Name: Christine Christensen MRN: [id number] Date of Birth: [birth year]  Transition of Care [facility]) CM/SW [place], LCSW Phone Number:[phone] 06/25/2021, 11:53 AM  Clinical Narrative:     CSW received a follow up call from [place] and she confirmed that Josem Kaufmann was started yesterday. CSW inquired about private room for pt and she stated that she would have a private room. CSW updated pt's son. Facility is aware to reach out to CSW if the authorization comes back over the weekend.  TOC team will continue to assist with discharge planning needs.        Expected Discharge Plan and Services           Expected Discharge Date: 06/25/21                                     Social Determinants of Health (SDOH) Interventions    Readmission Risk Interventions No flowsheet data found.

## 2021-06-25 NOTE — Discharge Summary (Signed)
Physician Discharge Summary  Christine Christensen QZR:[id number] DOB: [birth year] DOA: 06/12/2021  PCP: Nolene Ebbs, MD  Admit date: 06/12/2021 Discharge date: 06/25/2021  Admitted From: Home  Disposition:  SNF  Recommendations for Outpatient Follow-up:  Follow up with PCP in 1-2 weeks Please obtain BMP/CBC in one week Needs to follow up with Ortho post sx 2 weeks.  Delirium Precaution.  WBAT with posterior hip precautions x6 weeks, hip abduction pillow to be used when in bed. No Knee Immobilizer. Lovenox for DVT prophylaxis 4 weeks.    Discharge Condition: Stable.  CODE STATUS: Full Code Diet recommendation: Heart Healthy    Brief/Interim Summary: 75 year old with past medical history significant for CVA with right-sided weakness, hypertension, vascular dementia, Mnire's disease, moderate mitral stenosis who presents after sudden fall in her kitchen and found to have a right femoral neck fracture.  Patient underwent right hip hemiarthroplasty on 9/27 after undergoing cardiac evaluation and taking risk of surgery.  Patient hospital course complicated by confusion and hypoactive delirium and was a slowly to improve.  Patient became more confused and agitated overnight 10/3, she required a dose of Ativan.  She was evaluated by psych who recommended delirium precaution and to hold Remeron. She Has improved, she is more calm.  Awaiting skilled nursing facility for rehab    1-Close traumatic right hip fracture: Right hip hemiarthroplasty 9/27 -Weightbearing as tolerated. -Plan for skilled nursing facility. Tylenol as needed for pain -Need Lovenox for DVT prophylaxis for 4 weeks Stable for transfer.    2-Severe calcific mitral valve stenosis with severe pulmonary hypertension and mild aortic valve stenosis: Evaluated by cardiology who recommended conservative management. Patient was a started on low-dose beta-blocker.   3-History of stroke, Right Hemiparesis  -On aspirin.  -CT head  without acute finding.   Essential HTN; On metoprolol.    Acute Metabolic Encephalopathy: in setting of vascular dementia. Delirium post op, medication, anesthesia.  Delirium precaution.  Patient initially had hypoactive delirium, opioid and Remeron discontinued.  Subsequently she had an episode of agitation.  Received a dose of Ativan. -Patient B12 normal. -UA negative.  -Evaluated by psych Remeron held, Seroquel PRN for agitation. she has not required Seroquel.   improved  See below wound care note Pressure Injury 06/16/21 Buttocks Left Stage 2 -  Partial thickness loss of dermis presenting as a shallow open injury with a red, pink wound bed without slough. approx 38mm (Active)  06/16/21 0000  Location: Buttocks  Location Orientation: Left  Staging: Stage 2 -  Partial thickness loss of dermis presenting as a shallow open injury with a red, pink wound bed without slough.  Wound Description (Comments): approx 25mm  Present on Admission: No    Severe Protein Malnutrition. On supplement.    Nutrition Problem: Severe Malnutrition Etiology: chronic illness (dementia and CVA)       Signs/Symptoms: severe fat depletion, severe muscle depletion       Interventions: Ensure Enlive (each supplement provides 350kcal and 20 grams of protein), Liberalize Diet   Estimated body mass index is 20.7 kg/m as calculated from the following:   Height as of this encounter: 5\' 3"  (1.6 m).   Weight as of this encounter: 53 kg.     Discharge Diagnoses:  Active Problems:   Closed right hip fracture, initial encounter ([facility])   Hip fracture (HCC)   Protein-calorie malnutrition, severe   Pressure injury of skin   Delirium due to another medical condition   Dementia with behavioral disturbance  Discharge Instructions  Discharge Instructions     Diet - low sodium heart healthy   Complete by: As directed    Discharge wound care:   Complete by: As directed    See above   Increase activity  slowly   Complete by: As directed       Allergies as of 06/25/2021   No Known Allergies      Medication List     STOP taking these medications    aspirin 325 MG EC tablet Replaced by: aspirin 325 MG tablet   mirtazapine 7.5 MG tablet Commonly known as: REMERON       TAKE these medications    acetaminophen 500 MG tablet Commonly known as: TYLENOL Take 2 tablets (1,000 mg total) by mouth every 6 (six) hours as needed for mild pain, moderate pain, fever or headache.   aspirin 325 MG tablet Take 1 tablet (325 mg total) by mouth daily. Replaces: aspirin 325 MG EC tablet   enoxaparin 40 MG/0.4ML injection Commonly known as: LOVENOX Inject 0.4 mLs (40 mg total) into the skin daily.   escitalopram 20 MG tablet Commonly known as: LEXAPRO Take 20 mg daily by mouth.   feeding supplement Liqd Take 237 mLs by mouth 2 (two) times daily between meals.   fexofenadine 180 MG tablet Commonly known as: ALLEGRA Take 180 mg by mouth daily as needed for allergies or rhinitis.   melatonin 3 MG Tabs tablet Take 1 tablet (3 mg total) by mouth at bedtime.   metoprolol tartrate 25 MG tablet Commonly known as: LOPRESSOR Take 0.5 tablets (12.5 mg total) by mouth 2 (two) times daily.   multivitamin with minerals Tabs tablet Take 1 tablet by mouth daily.   naproxen sodium 220 MG tablet Commonly known as: ALEVE Take 220 mg by mouth daily as needed (back pain).   polyethylene glycol 17 g packet Commonly known as: MIRALAX / GLYCOLAX Take 17 g by mouth 2 (two) times daily.   senna-docusate 8.6-50 MG tablet Commonly known as: Senokot-S Take 1 tablet by mouth 2 (two) times daily.               Discharge Care Instructions  (From admission, onward)           Start     Ordered   06/25/21 0000  Discharge wound care:       Comments: See above   06/25/21 0859            No Known Allergies  Consultations: Raliegh Ip, Ortho Psych    Procedures/Studies: DG  Chest 1 View  Result Date: 06/12/2021 CLINICAL DATA:  Fall with right-sided hip pain EXAM: CHEST  1 VIEW COMPARISON:  03/24/2020 FINDINGS: No focal opacity or pleural effusion. Stable cardiomediastinal silhouette with aortic atherosclerosis. No pneumothorax. IMPRESSION: No active disease. Electronically Signed   By: Donavan Foil M.D.   On: 06/12/2021 17:00   DG ELBOW COMPLETE RIGHT (3+VIEW)  Result Date: 06/15/2021 CLINICAL DATA:  Right elbow fracture EXAM: RIGHT ELBOW - COMPLETE 3+ VIEW COMPARISON:  None. FINDINGS: No displaced coronoid process fracture of the proximal right radius. No joint effusion or fat pad displacement. Alignment is normal. IMPRESSION: No displaced coronoid process fracture visible. Electronically Signed   By: Ulyses Jarred M.D.   On: 06/15/2021 02:33   DG Forearm Right  Result Date: 06/14/2021 CLINICAL DATA:  Fall EXAM: RIGHT FOREARM - 2 VIEW COMPARISON:  None. FINDINGS: Possible fracture deformity at the coronoid process of the ulna at the  elbow. No malalignment. IMPRESSION: Findings are indeterminate for coronoid process fracture at the elbow, recommend dedicated views of the right elbow. These results will be called to the ordering clinician or representative by the Radiologist Assistant, and communication documented in the PACS or [employer]. Electronically Signed   By: Donavan Foil M.D.   On: 06/14/2021 20:09   CT Head Wo Contrast  Result Date: 06/12/2021 CLINICAL DATA:  Head injury after fall. EXAM: CT HEAD WITHOUT CONTRAST TECHNIQUE: Contiguous axial images were obtained from the base of the skull through the vertex without intravenous contrast. COMPARISON:  March 24, 2020. FINDINGS: Brain: Mild chronic ischemic white matter disease is noted. No mass effect or midline shift is noted. Ventricular size is within normal limits. There is no evidence of mass lesion, hemorrhage or acute infarction. Vascular: No hyperdense vessel or unexpected calcification. Skull: Normal.  Negative for fracture or focal lesion. Sinuses/Orbits: No acute finding. Other: None. IMPRESSION: No acute intracranial abnormality seen. Electronically Signed   By: Marijo Conception M.D.   On: 06/12/2021 16:02   DG Pelvis Portable  Result Date: 06/17/2021 CLINICAL DATA:  Postop EXAM: PORTABLE PELVIS 1-2 VIEWS COMPARISON:  06/14/2021 FINDINGS: Right hip hemiarthroplasty unchanged from the prior study. Normal alignment of the prosthesis. No fracture. The patient rotated to the right. Remainder of the pelvis negative. Pelvic calcification consistent with uterine fibroid. IMPRESSION: Right hip hemiarthroplasty.  No acute fracture in the pelvis. Electronically Signed   By: Franchot Gallo M.D.   On: 06/17/2021 12:49   DG Pelvis Portable  Result Date: 06/14/2021 CLINICAL DATA:  Right hip hemiarthroplasty EXAM: PORTABLE PELVIS 1-2 VIEWS COMPARISON:  06/12/2021 FINDINGS: Single frontal view of the pelvis was obtained, limited by portable technique. Right hip hemiarthroplasty is identified in the expected position without signs of acute complication. Remainder of the bony pelvis is unremarkable. IMPRESSION: 1. Unremarkable right hip hemiarthroplasty. Electronically Signed   By: Randa Ngo M.D.   On: 06/14/2021 16:50   DG Ankle Right Port  Result Date: 06/13/2021 CLINICAL DATA:  Pain EXAM: PORTABLE RIGHT ANKLE - 2 VIEW COMPARISON:  None. FINDINGS: There is no evidence of fracture, dislocation, or joint effusion. There is no evidence of arthropathy or other focal bone abnormality. Soft tissue edema about the medial foot. IMPRESSION: No fracture or dislocation of the right ankle. Soft tissue edema about the medial foot. Electronically Signed   By: Eddie Candle M.D.   On: 06/13/2021 13:21   DG Humerus Right  Result Date: 06/14/2021 CLINICAL DATA:  Fall with elbow pain EXAM: RIGHT HUMERUS - 2+ VIEW COMPARISON:  None. FINDINGS: There is no evidence of fracture or other focal bone lesions. Soft tissues are  unremarkable. IMPRESSION: Negative. Electronically Signed   By: Donavan Foil M.D.   On: 06/14/2021 20:09   ECHOCARDIOGRAM COMPLETE  Result Date: 06/13/2021    ECHOCARDIOGRAM REPORT   Patient Name:   Christine Christensen [facility] Date of Exam: 06/13/2021 Medical Rec #:  [id number]        Height:       63.5 in Accession #:    [id number]       Weight:       117.0 lb Date of Birth:  12-03-1945         BSA:          1.549 m Patient Age:    75 years         BP:           142/76 mmHg  Patient Gender: F                HR:           71 bpm. Exam Location:  Inpatient Procedure: 2D Echo, 3D Echo, Cardiac Doppler and Color Doppler                                 MODIFIED REPORT:   This report was modified by Lyman Bishop MD on 06/13/2021 due to Noted LVEDP      elevated - however, difficult to quantify due to heavy mitral annular              calcification - suspect LVEDP value was overestimated.  Indications:     R55 Syncope  History:         Patient has prior history of Echocardiogram examinations, most                  recent 07/27/2017. Stroke, Aortic Valve Disease and Mitral Valve                  Disease, Signs/Symptoms:Murmur and Syncope; Risk                  Factors:Hypertension. Mitral and aortic stenosis.  Sonographer:     Roseanna Rainbow RDCS Referring Phys:  [id number] Lequita Halt Diagnosing Phys: Lyman Bishop MD IMPRESSIONS  1. Left ventricular ejection fraction, by estimation, is >75%. Left ventricular ejection fraction by 2D MOD biplane is 75.8 %. The left ventricle has hyperdynamic function. The left ventricle has no regional wall motion abnormalities. There is mild left  ventricular hypertrophy. Left ventricular diastolic parameters are consistent with Grade II diastolic dysfunction (pseudonormalization). Elevated left ventricular end-diastolic pressure.  2. Right ventricular systolic function is hyperdynamic. The right ventricular size is normal. There is severely elevated pulmonary artery systolic pressure. The estimated right  ventricular systolic pressure is 06.2 mmHg.  3. The mitral valve is abnormal. Trivial mitral valve regurgitation. Severe mitral stenosis. The mean mitral valve gradient is 18.0 mmHg with average heart rate of 79 bpm. Severe mitral annular calcification.  4. There is heavy calcification of the aortic root uncluding a calcificed mass below the aortic valve annulus in the aorta/mitral curtain. The aortic valve is tricuspid. There is mild calcification of the aortic valve. Aortic valve regurgitation is not visualized. Mild aortic valve stenosis. Aortic valve mean gradient measures 10.4 mmHg. AVA around 1.56 cm2, DI 0.50  5. The inferior vena cava is normal in size with <50% respiratory variability, suggesting right atrial pressure of 8 mmHg. Comparison(s): Changes from prior study are noted. 07/27/2017: LVEF 60-65%, mild LVH, grade 1 DD with elevated LVEDP, Moderate MS - mean gradient 10 mmHg, mild AS - mean gradient 10 mmHg. Conclusion(s)/Recommendation(s): There is severe mitral stenosis with severe pulmonary hypertension and high LV filling pressure - the aortic valve appears unchanged, however, there is heavy aortic root calcification - consider CT surgery consultation. FINDINGS  Left Ventricle: Left ventricular ejection fraction, by estimation, is >75%. Left ventricular ejection fraction by 2D MOD biplane is 75.8 %. The left ventricle has hyperdynamic function. The left ventricle has no regional wall motion abnormalities. 3D left ventricular ejection fraction analysis performed but not reported based on interpreter judgement due to suboptimal quality. The left ventricular internal cavity size was normal in size. There is mild left ventricular hypertrophy. Left ventricular diastolic parameters are consistent with Grade II  diastolic dysfunction (pseudonormalization). Elevated left ventricular end-diastolic pressure. Right Ventricle: The right ventricular size is normal. No increase in right ventricular wall thickness.  Right ventricular systolic function is hyperdynamic. There is severely elevated pulmonary artery systolic pressure. The tricuspid regurgitant velocity  is 3.96 m/s, and with an assumed right atrial pressure of 8 mmHg, the estimated right ventricular systolic pressure is 96.2 mmHg. Left Atrium: Left atrial size was normal in size. Right Atrium: Right atrial size was normal in size. Pericardium: There is no evidence of pericardial effusion. Mitral Valve: The mitral valve is abnormal. Severe mitral annular calcification. Trivial mitral valve regurgitation. Severe mitral valve stenosis. MV peak gradient, 31.9 mmHg. The mean mitral valve gradient is 18.0 mmHg with average heart rate of 79 bpm. Tricuspid Valve: The tricuspid valve is grossly normal. Tricuspid valve regurgitation is mild. Aortic Valve: There is heavy calcification of the aortic root uncluding a calcificed mass below the aortic valve annulus in the aorta/mitral curtain. The aortic valve is tricuspid. There is mild calcification of the aortic valve. There is moderate to severe aortic valve annular calcification. Aortic valve regurgitation is not visualized. Mild aortic stenosis is present. Aortic valve mean gradient measures 10.4 mmHg. Aortic valve peak gradient measures 20.2 mmHg. Aortic valve area, by VTI measures 1.28 cm. Pulmonic Valve: The pulmonic valve was grossly normal. Pulmonic valve regurgitation is trivial. Aorta: The aortic root and ascending aorta are structurally normal, with no evidence of dilitation. Venous: The inferior vena cava is normal in size with less than 50% respiratory variability, suggesting right atrial pressure of 8 mmHg. IAS/Shunts: There is right bowing of the interatrial septum, suggestive of elevated left atrial pressure. No atrial level shunt detected by color flow Doppler.  LEFT VENTRICLE PLAX 2D                        Biplane EF (MOD) LVIDd:         3.50 cm         LV Biplane EF:   Left LVIDs:         2.30 cm                           ventricular LV PW:         0.90 cm                          ejection LV IVS:        1.30 cm                          fraction by LVOT diam:     1.80 cm                          2D MOD LV SV:         56                               biplane is LV SV Index:   36                               75.8 %. LVOT Area:     2.54 cm  Diastology                                LV e' medial:    3.48 cm/s LV Volumes (MOD)               LV E/e' medial:  72.7 LV vol d, MOD    24.5 ml       LV e' lateral:   4.65 cm/s A2C:                           LV E/e' lateral: 54.4 LV vol d, MOD    45.9 ml A4C: LV vol s, MOD    4.9 ml A2C: LV vol s, MOD    12.5 ml       3D Volume EF: A4C:                           3D EF:        66 % LV SV MOD A2C:   19.6 ml       LV EDV:       73 ml LV SV MOD A4C:   45.9 ml       LV ESV:       25 ml LV SV MOD BP:    25.6 ml       LV SV:        48 ml RIGHT VENTRICLE             IVC RV S prime:     13.70 cm/s  IVC diam: 1.40 cm TAPSE (M-mode): 2.1 cm LEFT ATRIUM             Index       RIGHT ATRIUM           Index LA diam:        4.40 cm 2.84 cm/m  RA Area:     11.70 cm LA Vol (A2C):   40.5 ml 26.15 ml/m RA Volume:   24.80 ml  16.01 ml/m LA Vol (A4C):   48.1 ml 31.06 ml/m LA Biplane Vol: 45.0 ml 29.06 ml/m  AORTIC VALVE AV Area (Vmax):    1.56 cm AV Area (Vmean):   1.51 cm AV Area (VTI):     1.28 cm AV Vmax:           224.80 cm/s AV Vmean:          144.000 cm/s AV VTI:            0.434 m AV Peak Grad:      20.2 mmHg AV Mean Grad:      10.4 mmHg LVOT Vmax:         138.00 cm/s LVOT Vmean:        85.400 cm/s LVOT VTI:          0.219 m LVOT/AV VTI ratio: 0.50  AORTA Ao Root diam: 2.70 cm Ao Asc diam:  2.80 cm MITRAL VALVE                TRICUSPID VALVE MV Area (PHT): 2.73 cm     TR Peak grad:   62.7 mmHg MV Area VTI:   0.57 cm     TR Vmax:        396.00 cm/s MV Peak grad:  31.9 mmHg MV Mean grad:  18.0 mmHg  SHUNTS MV Vmax:       2.82 m/s     Systemic VTI:  0.22 m  MV Vmean:      197.0 cm/s   Systemic Diam: 1.80 cm MV Decel Time: 278 msec MV E velocity: 253.00 cm/s MV A velocity: 238.00 cm/s MV E/A ratio:  1.06 Lyman Bishop MD Electronically signed by Lyman Bishop MD Signature Date/Time: 06/13/2021/10:35:48 AM    Final (Updated)    DG HIP PORT UNILAT WITH PELVIS 1V RIGHT  Result Date: 06/20/2021 CLINICAL DATA:  Status post fall. EXAM: DG HIP (WITH OR WITHOUT PELVIS) 1V PORT RIGHT COMPARISON:  None. FINDINGS: A total right hip replacement is seen without evidence of surrounding lucency to suggest the presence of hardware loosening or infection. There is no evidence of acute fracture or dislocation. Small calcified uterine fibroids are suspected. IMPRESSION: Total right hip replacement without evidence of hardware loosening or infection. Electronically Signed   By: Virgina Norfolk M.D.   On: 06/20/2021 19:29   DG Hip Unilat W or Wo Pelvis 2-3 Views Right  Result Date: 06/12/2021 CLINICAL DATA:  Right hip pain after fall. EXAM: DG HIP (WITH OR WITHOUT PELVIS) 2-3V RIGHT COMPARISON:  None. FINDINGS: Moderately displaced proximal right femoral neck fracture is noted. Left hip is unremarkable. IMPRESSION: Moderately displaced proximal right femoral neck fracture. Electronically Signed   By: Marijo Conception M.D.   On: 06/12/2021 17:00     Subjective: Alert, oriented. Recognized son. Report constipation.   Discharge Exam: Vitals:   06/25/21 0357 06/25/21 0723  BP: 116/63 (!) 126/48  Pulse: 70 70  Resp: 16 20  Temp: 98.5 F (36.9 C) 97.9 F (36.6 C)  SpO2: 95% 95%     General: Pt is alert, awake, not in acute distress Cardiovascular: RRR, S1/S2 +, no rubs, no gallops Respiratory: CTA bilaterally, no wheezing, no rhonchi Abdominal: Soft, NT, ND, bowel sounds + Extremities: no edema, no cyanosis    The results of significant diagnostics from this hospitalization (including imaging, microbiology, ancillary and laboratory) are listed below for  reference.     Microbiology: Recent Results (from the past 240 hour(s))  SARS CORONAVIRUS 2 (TAT 6-24 HRS) Nasopharyngeal Nasopharyngeal Swab     Status: None   Collection Time: 06/21/21  5:40 PM   Specimen: Nasopharyngeal Swab  Result Value Ref Range Status   SARS Coronavirus 2 NEGATIVE NEGATIVE Final    Comment: (NOTE) SARS-CoV-2 target nucleic acids are NOT DETECTED.  The SARS-CoV-2 RNA is generally detectable in upper and lower respiratory specimens during the acute phase of infection. Negative results do not preclude SARS-CoV-2 infection, do not rule out co-infections with other pathogens, and should not be used as the sole basis for treatment or other patient management decisions. Negative results must be combined with clinical observations, patient history, and epidemiological information. The expected result is Negative.  Fact Sheet for Patients: SugarRoll.be  Fact Sheet for Healthcare Providers: https://www.woods-mathews.com/  This test is not yet approved or cleared by the [place] FDA and  has been authorized for detection and/or diagnosis of SARS-CoV-2 by FDA under an Emergency Use Authorization (EUA). This EUA will remain  in effect (meaning this test can be used) for the duration of the COVID-19 declaration under Se ction 564(b)(1) of the Act, 21 U.S.C. section 360bbb-3(b)(1), unless the authorization is terminated or revoked sooner.  Performed at [facility], [place] [address] [place], [place] [address]      Labs: BNP (last 3 results)  No results for input(s): BNP in the last 8760 hours. Basic Metabolic Panel: Recent Labs  Lab 06/21/21 1034 06/24/21 0929  NA 136 135  K 4.2 4.2  CL 101 101  CO2 27 25  GLUCOSE 94 111*  BUN 11 18  CREATININE 0.57 0.64  CALCIUM 9.1 9.3  MG 1.8  --   PHOS 4.1  --    Liver Function Tests: Recent Labs  Lab 06/21/21 1034  AST 20  ALT 16  ALKPHOS 63  BILITOT 0.5   PROT 6.1*  ALBUMIN 2.6*   No results for input(s): LIPASE, AMYLASE in the last 168 hours. No results for input(s): AMMONIA in the last 168 hours. CBC: Recent Labs  Lab 06/21/21 1034  WBC 7.4  NEUTROABS 4.5  HGB 10.7*  HCT 32.5*  MCV 87.1  PLT 366   Cardiac Enzymes: No results for input(s): CKTOTAL, CKMB, CKMBINDEX, TROPONINI in the last 168 hours. BNP: Invalid input(s): POCBNP CBG: No results for input(s): GLUCAP in the last 168 hours. D-Dimer No results for input(s): DDIMER in the last 72 hours. Hgb A1c No results for input(s): HGBA1C in the last 72 hours. Lipid Profile No results for input(s): CHOL, HDL, LDLCALC, TRIG, CHOLHDL, LDLDIRECT in the last 72 hours. Thyroid function studies No results for input(s): TSH, T4TOTAL, T3FREE, THYROIDAB in the last 72 hours.  Invalid input(s): FREET3 Anemia work up No results for input(s): VITAMINB12, FOLATE, FERRITIN, TIBC, IRON, RETICCTPCT in the last 72 hours. Urinalysis    Component Value Date/Time   COLORURINE YELLOW 06/21/2021 1100   APPEARANCEUR CLEAR 06/21/2021 1100   LABSPEC 1.017 06/21/2021 1100   PHURINE 6.0 06/21/2021 1100   GLUCOSEU NEGATIVE 06/21/2021 1100   HGBUR NEGATIVE 06/21/2021 1100   BILIRUBINUR NEGATIVE 06/21/2021 1100   KETONESUR NEGATIVE 06/21/2021 1100   PROTEINUR NEGATIVE 06/21/2021 1100   NITRITE NEGATIVE 06/21/2021 1100   LEUKOCYTESUR NEGATIVE 06/21/2021 1100   Sepsis Labs Invalid input(s): PROCALCITONIN,  WBC,  LACTICIDVEN Microbiology Recent Results (from the past 240 hour(s))  SARS CORONAVIRUS 2 (TAT 6-24 HRS) Nasopharyngeal Nasopharyngeal Swab     Status: None   Collection Time: 06/21/21  5:40 PM   Specimen: Nasopharyngeal Swab  Result Value Ref Range Status   SARS Coronavirus 2 NEGATIVE NEGATIVE Final    Comment: (NOTE) SARS-CoV-2 target nucleic acids are NOT DETECTED.  The SARS-CoV-2 RNA is generally detectable in upper and lower respiratory specimens during the acute phase of  infection. Negative results do not preclude SARS-CoV-2 infection, do not rule out co-infections with other pathogens, and should not be used as the sole basis for treatment or other patient management decisions. Negative results must be combined with clinical observations, patient history, and epidemiological information. The expected result is Negative.  Fact Sheet for Patients: SugarRoll.be  Fact Sheet for Healthcare Providers: https://www.woods-mathews.com/  This test is not yet approved or cleared by the [place] FDA and  has been authorized for detection and/or diagnosis of SARS-CoV-2 by FDA under an Emergency Use Authorization (EUA). This EUA will remain  in effect (meaning this test can be used) for the duration of the COVID-19 declaration under Se ction 564(b)(1) of the Act, 21 U.S.C. section 360bbb-3(b)(1), unless the authorization is terminated or revoked sooner.  Performed at [facility], [place] [address] [place], [place] [address]      Time coordinating discharge: 40 minutes  SIGNED:   Elmarie Shiley, MD  [facility]

## 2021-06-25 NOTE — TOC Progression Note (Signed)
Transition of Care [facility]) - Progression Note    Patient Details  Name: Christine Christensen MRN: [id number] Date of Birth: Jan 19, 1946  Transition of Care [facility]) CM/SW [place], [place] Phone Number: [phone] 06/25/2021, 10:28 AM  Clinical Narrative:     CSW spoke with pt son and confirmed that bed offer of [facility]. CSW spoke with Gilman Schmidt at [employer] and she confirmed that she was aware of the pt coming to [place] and that she would need to start pt's authorization. She explained that the authorization are called into the insurance and not done through a portal. Son asked about a private room and CSW informed him that she would need to ask facility.  TOC team will continue to assist with discharge planning needs.        Expected Discharge Plan and Services           Expected Discharge Date: 06/25/21                                     Social Determinants of Health (SDOH) Interventions    Readmission Risk Interventions No flowsheet data found.

## 2021-06-26 DIAGNOSIS — W19XXXA Unspecified fall, initial encounter: Secondary | ICD-10-CM | POA: Diagnosis not present

## 2021-06-26 DIAGNOSIS — S72001A Fracture of unspecified part of neck of right femur, initial encounter for closed fracture: Secondary | ICD-10-CM | POA: Diagnosis not present

## 2021-06-26 LAB — CBC
HCT: 33.9 % — ABNORMAL LOW (ref 36.0–46.0)
Hemoglobin: 11.2 g/dL — ABNORMAL LOW (ref 12.0–15.0)
MCH: 29 pg (ref 26.0–34.0)
MCHC: 33 g/dL (ref 30.0–36.0)
MCV: 87.8 fL (ref 80.0–100.0)
Platelets: 526 10*3/uL — ABNORMAL HIGH (ref 150–400)
RBC: 3.86 MIL/uL — ABNORMAL LOW (ref 3.87–5.11)
RDW: 15.5 % (ref 11.5–15.5)
WBC: 9.5 10*3/uL (ref 4.0–10.5)
nRBC: 0 % (ref 0.0–0.2)

## 2021-06-26 LAB — BASIC METABOLIC PANEL
Anion gap: 11 (ref 5–15)
BUN: 21 mg/dL (ref 8–23)
CO2: 25 mmol/L (ref 22–32)
Calcium: 9.5 mg/dL (ref 8.9–10.3)
Chloride: 103 mmol/L (ref 98–111)
Creatinine, Ser: 0.62 mg/dL (ref 0.44–1.00)
GFR, Estimated: >60 mL/min (ref >60)
Glucose, Bld: 112 mg/dL — ABNORMAL HIGH (ref 70–99)
Potassium: 4.3 mmol/L (ref 3.5–5.1)
Sodium: 139 mmol/L (ref 135–145)

## 2021-06-26 NOTE — Progress Notes (Signed)
Reached out to ortho regarding the dressing change of right hip surgery. Dressing changed per instructions. Patient had overall good day, Vicodin given once with tylenol as scheduled. Pt was given bath and made comfortable in bed. Son was calling to RN for updates.  Will continue to monitor the patient  Palma Holter, RN

## 2021-06-26 NOTE — Progress Notes (Signed)
PROGRESS NOTE    Christine Christensen  NIO:[id number] DOB: 1945-12-12 DOA: 06/12/2021 PCP: Nolene Ebbs, MD   Brief Narrative: 75 year old with past medical history significant for CVA with right-sided weakness, hypertension, vascular dementia, Mnire's disease, moderate mitral stenosis who presents after sudden fall in her kitchen and found to have a right femoral neck fracture.  Patient underwent right hip hemiarthroplasty on 9/27 after undergoing cardiac evaluation and taking risk of surgery.  Patient hospital course complicated by confusion and hypoactive delirium and was a slowly to improve.  Patient became more confused and agitated overnight 10/3, she required a dose of Ativan.  She was evaluated by psych who recommended delirium precaution and to hold Remeron. She Has improved, she is more calm.  Awaiting skilled nursing facility for rehab   Assessment & Plan:   Active Problems:   Closed right hip fracture, initial encounter ([place])   Hip fracture (HCC)   Protein-calorie malnutrition, severe   Pressure injury of skin   Delirium due to another medical condition   Dementia with behavioral disturbance   1-Close traumatic right hip fracture: Right hip hemiarthroplasty 9/27 -Weightbearing as tolerated. -Plan for skilled nursing facility. Tylenol as needed for pain -Need Lovenox for DVT prophylaxis for 4 weeks -Ortho following  2-Severe calcific mitral valve stenosis with severe pulmonary hypertension and mild aortic valve stenosis: Evaluated by cardiology who recommended conservative management. Patient was a started on low-dose beta-blocker.  3-History of stroke, Right Hemiparesis  -On aspirin.  -CT head without acute finding. Old strokes.   Essential HTN; On metoprolol.   Acute Metabolic Encephalopathy: in setting of vascular dementia. Delirium post op, medication, anesthesia.  Delirium precaution.  Patient initially had hypoactive delirium, opioid and Remeron discontinued.   Subsequently she had an episode of agitation.  Received a dose of Ativan. -Patient B12 normal. -UA negative.  -Evaluated by psych Remeron held, Seroquel PRN for agitation.  She was agitated yesterday, received IM Zyprexa  She is calm today, alert and oriented to person.   Pressure Injury 06/16/21 Buttocks Left Stage 2 -  Partial thickness loss of dermis presenting as a shallow open injury with a red, pink wound bed without slough. approx 26mm (Active)  06/16/21 0000  Location: Buttocks  Location Orientation: Left  Staging: Stage 2 -  Partial thickness loss of dermis presenting as a shallow open injury with a red, pink wound bed without slough.  Wound Description (Comments): approx 97mm  Present on Admission: No     Nutrition Problem: Severe Malnutrition Etiology: chronic illness (dementia and CVA)    Signs/Symptoms: severe fat depletion, severe muscle depletion    Interventions: Ensure Enlive (each supplement provides 350kcal and 20 grams of protein), Liberalize Diet  Estimated body mass index is 20.42 kg/m as calculated from the following:   Height as of this encounter: 5\' 3"  (1.6 m).   Weight as of this encounter: 52.3 kg.   DVT prophylaxis: Lovenox Code Status: Full code Family Communication: Son 10/08 Disposition Plan:  Status is: Inpatient  Remains inpatient appropriate because:Inpatient level of care appropriate due to severity of illness  Dispo: The patient is from: Home              Anticipated d/c is to: SNF              Patient currently is medically stable to d/c.   Difficult to place patient No        Consultants:  Ortho Psych  Procedures:  Hip Sx  Antimicrobials:  Subjective: She was agitated yesterday.  She is calm today, denies pain   Objective: Vitals:   06/25/21 2325 06/26/21 0317 06/26/21 0732 06/26/21 1143  BP: 109/73 107/75 109/65 107/64  Pulse: 82  82 82  Resp:   20 20  Temp:  98.5 F (36.9 C) 98.6 F (37 C) 98 F (36.7  C)  TempSrc:  Oral Oral Oral  SpO2:   95% 95%  Weight:  52.3 kg    Height:        Intake/Output Summary (Last 24 hours) at 06/26/2021 1323 Last data filed at 06/26/2021 0913 Gross per 24 hour  Intake 220 ml  Output 0 ml  Net 220 ml    Filed Weights   06/12/21 1709 06/13/21 1912 06/26/21 0317  Weight: 53.1 kg 53 kg 52.3 kg    Examination:  General exam: NAD Respiratory system: CTA Cardiovascular system: S 1, S 2 RRR Gastrointestinal system: BS present, soft, nt Central nervous system: Alert, follows command Extremities: No edema  Data Reviewed: I have personally reviewed following labs and imaging studies  CBC: Recent Labs  Lab 06/21/21 1034  WBC 7.4  NEUTROABS 4.5  HGB 10.7*  HCT 32.5*  MCV 87.1  PLT 433    Basic Metabolic Panel: Recent Labs  Lab 06/21/21 1034 06/24/21 0929  NA 136 135  K 4.2 4.2  CL 101 101  CO2 27 25  GLUCOSE 94 111*  BUN 11 18  CREATININE 0.57 0.64  CALCIUM 9.1 9.3  MG 1.8  --   PHOS 4.1  --     GFR: Estimated Creatinine Clearance: 50.2 mL/min (by C-G formula based on SCr of 0.64 mg/dL). Liver Function Tests: Recent Labs  Lab 06/21/21 1034  AST 20  ALT 16  ALKPHOS 63  BILITOT 0.5  PROT 6.1*  ALBUMIN 2.6*    No results for input(s): LIPASE, AMYLASE in the last 168 hours. No results for input(s): AMMONIA in the last 168 hours. Coagulation Profile: No results for input(s): INR, PROTIME in the last 168 hours. Cardiac Enzymes: No results for input(s): CKTOTAL, CKMB, CKMBINDEX, TROPONINI in the last 168 hours. BNP (last 3 results) No results for input(s): PROBNP in the last 8760 hours. HbA1C: No results for input(s): HGBA1C in the last 72 hours. CBG: No results for input(s): GLUCAP in the last 168 hours. Lipid Profile: No results for input(s): CHOL, HDL, LDLCALC, TRIG, CHOLHDL, LDLDIRECT in the last 72 hours. Thyroid Function Tests: No results for input(s): TSH, T4TOTAL, FREET4, T3FREE, THYROIDAB in the last 72  hours.  Anemia Panel: No results for input(s): VITAMINB12, FOLATE, FERRITIN, TIBC, IRON, RETICCTPCT in the last 72 hours.  Sepsis Labs: No results for input(s): PROCALCITON, LATICACIDVEN in the last 168 hours.  Recent Results (from the past 240 hour(s))  SARS CORONAVIRUS 2 (TAT 6-24 HRS) Nasopharyngeal Nasopharyngeal Swab     Status: None   Collection Time: 06/21/21  5:40 PM   Specimen: Nasopharyngeal Swab  Result Value Ref Range Status   SARS Coronavirus 2 NEGATIVE NEGATIVE Final    Comment: (NOTE) SARS-CoV-2 target nucleic acids are NOT DETECTED.  The SARS-CoV-2 RNA is generally detectable in upper and lower respiratory specimens during the acute phase of infection. Negative results do not preclude SARS-CoV-2 infection, do not rule out co-infections with other pathogens, and should not be used as the sole basis for treatment or other patient management decisions. Negative results must be combined with clinical observations, patient history, and epidemiological information. The expected result is Negative.  Fact Sheet for Patients: SugarRoll.be  Fact Sheet for Healthcare Providers: https://www.woods-mathews.com/  This test is not yet approved or cleared by the [place] FDA and  has been authorized for detection and/or diagnosis of SARS-CoV-2 by FDA under an Emergency Use Authorization (EUA). This EUA will remain  in effect (meaning this test can be used) for the duration of the COVID-19 declaration under Se ction 564(b)(1) of the Act, 21 U.S.C. section 360bbb-3(b)(1), unless the authorization is terminated or revoked sooner.  Performed at [facility], [place] [address], [place], [place] [address]           Radiology Studies: CT HEAD WO CONTRAST (5MM)  Result Date: 06/25/2021 CLINICAL DATA:  Delirium EXAM: CT HEAD WITHOUT CONTRAST TECHNIQUE: Contiguous axial images were obtained from the base of the skull through  the vertex without intravenous contrast. COMPARISON:  06/12/2021 FINDINGS: Brain: There is no mass, hemorrhage or extra-axial collection. The size and configuration of the ventricles and extra-axial CSF spaces are normal. There is hypoattenuation of the white matter, most commonly indicating chronic small vessel disease. There are old infarcts of the right cerebellum and left pons, unchanged. Vascular: Atherosclerotic calcification of the internal carotid arteries at the skull base. No abnormal hyperdensity of the major intracranial arteries or dural venous sinuses. Skull: The visualized skull base, calvarium and extracranial soft tissues are normal. Sinuses/Orbits: No fluid levels or advanced mucosal thickening of the visualized paranasal sinuses. No mastoid or middle ear effusion. The orbits are normal. IMPRESSION: 1. No acute intracranial abnormality. 2. Old right cerebellar and left pontine infarcts and findings of chronic small vessel disease. Electronically Signed   By: Ulyses Jarred M.D.   On: 06/25/2021 23:17        Scheduled Meds:  acetaminophen  500 mg Oral TID   aspirin  325 mg Oral Daily   Chlorhexidine Gluconate Cloth  6 each Topical Daily   enoxaparin (LOVENOX) injection  40 mg Subcutaneous Q24H   escitalopram  20 mg Oral Daily   feeding supplement  237 mL Oral BID BM   melatonin  3 mg Oral QHS   metoprolol tartrate  12.5 mg Oral BID   multivitamin with minerals  1 tablet Oral Daily   polyethylene glycol  17 g Oral BID   senna-docusate  1 tablet Oral BID   Continuous Infusions:   LOS: 14 days    Time spent: 35 minutes.     Elmarie Shiley, MD [facility]   If 7PM-7AM, please contact night-coverage [email or web address]  06/26/2021, 1:23 PM

## 2021-06-27 DIAGNOSIS — S72001A Fracture of unspecified part of neck of right femur, initial encounter for closed fracture: Secondary | ICD-10-CM | POA: Diagnosis not present

## 2021-06-27 LAB — RESP PANEL BY RT-PCR (FLU A&B, COVID) ARPGX2
Influenza A by PCR: NEGATIVE
Influenza B by PCR: NEGATIVE
SARS Coronavirus 2 by RT PCR: NEGATIVE

## 2021-06-27 MED ORDER — QUETIAPINE FUMARATE 25 MG PO TABS: 12.5000 mg | ORAL_TABLET | Freq: Every day | ORAL | 0 refills | Status: DC | PRN

## 2021-06-27 NOTE — Discharge Summary (Signed)
Physician Discharge Summary  LETRICE Christensen PIR:[id number] DOB: Dec 15, 1945 DOA: 06/12/2021  PCP: Nolene Ebbs, MD  Admit date: 06/12/2021 Discharge date: 06/27/2021  Admitted From: Home  Disposition:  SNF  Recommendations for Outpatient Follow-up:  Follow up with PCP in 1-2 weeks Please obtain BMP/CBC in one week Needs to follow up with Ortho post sx 2 weeks.  Delirium Precaution.  WBAT with posterior hip precautions x6 weeks, hip abduction pillow to be used when in bed. No Knee Immobilizer. Lovenox for DVT prophylaxis 4 weeks.    Discharge Condition: Stable.  CODE STATUS: Full Code Diet recommendation: Heart Healthy    Brief/Interim Summary: 75 year old with past medical history significant for CVA with right-sided weakness, hypertension, vascular dementia, Mnire's disease, moderate mitral stenosis who presents after sudden fall in her kitchen and found to have a right femoral neck fracture.  Patient underwent right hip hemiarthroplasty on 9/27 after undergoing cardiac evaluation and taking risk of surgery.  Patient hospital course complicated by confusion and hypoactive delirium and was a slowly to improve.  Patient became more confused and agitated overnight 10/3, she required a dose of Ativan.  She was evaluated by psych who recommended delirium precaution and to hold Remeron. She Has improved, she is more calm.  Awaiting skilled nursing facility for rehab    1-Close traumatic right hip fracture: Right hip hemiarthroplasty 9/27 -Weightbearing as tolerated. -Plan for skilled nursing facility. Tylenol as needed for pain -Need Lovenox for DVT prophylaxis for 4 weeks Stable for transfer.    2-Severe calcific mitral valve stenosis with severe pulmonary hypertension and mild aortic valve stenosis: Evaluated by cardiology who recommended conservative management. Patient was a started on low-dose beta-blocker.   3-History of stroke, Right Hemiparesis  -On aspirin.  -CT head  without acute finding.   Essential HTN; On metoprolol.    Acute Metabolic Encephalopathy: in setting of vascular dementia. Delirium post op, medication, anesthesia.  Delirium precaution.  Patient initially had hypoactive delirium, opioid and Remeron discontinued.  Subsequently she had an episode of agitation.  Received a dose of Ativan. -Patient B12 normal. -UA negative.  Repeated Ct head, stable.  -Evaluated by psych Remeron held, Seroquel PRN for agitation.  -sundowning. Respond to low dose Seroquel.  Plan to transfer to SNF   See below wound care note Pressure Injury 06/16/21 Buttocks Left Stage 2 -  Partial thickness loss of dermis presenting as a shallow open injury with a red, pink wound bed without slough. approx 79mm (Active)  06/16/21 0000  Location: Buttocks  Location Orientation: Left  Staging: Stage 2 -  Partial thickness loss of dermis presenting as a shallow open injury with a red, pink wound bed without slough.  Wound Description (Comments): approx 67mm  Present on Admission: No    Severe Protein Malnutrition. On supplement.    Nutrition Problem: Severe Malnutrition Etiology: chronic illness (dementia and CVA)       Signs/Symptoms: severe fat depletion, severe muscle depletion       Interventions: Ensure Enlive (each supplement provides 350kcal and 20 grams of protein), Liberalize Diet   Estimated body mass index is 20.7 kg/m as calculated from the following:   Height as of this encounter: 5\' 3"  (1.6 m).   Weight as of this encounter: 53 kg.     Discharge Diagnoses:  Active Problems:   Closed right hip fracture, initial encounter ([place])   Hip fracture (HCC)   Protein-calorie malnutrition, severe   Pressure injury of skin   Delirium due to another  medical condition   Dementia with behavioral disturbance    Discharge Instructions  Discharge Instructions     Diet - low sodium heart healthy   Complete by: As directed    Diet - low sodium heart  healthy   Complete by: As directed    Discharge wound care:   Complete by: As directed    See above   Discharge wound care:   Complete by: As directed    See above   Increase activity slowly   Complete by: As directed    Increase activity slowly   Complete by: As directed       Allergies as of 06/27/2021   No Known Allergies      Medication List     STOP taking these medications    aspirin 325 MG EC tablet Replaced by: aspirin 325 MG tablet   mirtazapine 7.5 MG tablet Commonly known as: REMERON       TAKE these medications    acetaminophen 500 MG tablet Commonly known as: TYLENOL Take 2 tablets (1,000 mg total) by mouth every 6 (six) hours as needed for mild pain, moderate pain, fever or headache.   aspirin 325 MG tablet Take 1 tablet (325 mg total) by mouth daily. Replaces: aspirin 325 MG EC tablet   enoxaparin 40 MG/0.4ML injection Commonly known as: LOVENOX Inject 0.4 mLs (40 mg total) into the skin daily.   escitalopram 20 MG tablet Commonly known as: LEXAPRO Take 20 mg daily by mouth.   feeding supplement Liqd Take 237 mLs by mouth 2 (two) times daily between meals.   fexofenadine 180 MG tablet Commonly known as: ALLEGRA Take 180 mg by mouth daily as needed for allergies or rhinitis.   melatonin 3 MG Tabs tablet Take 1 tablet (3 mg total) by mouth at bedtime.   metoprolol tartrate 25 MG tablet Commonly known as: LOPRESSOR Take 0.5 tablets (12.5 mg total) by mouth 2 (two) times daily.   multivitamin with minerals Tabs tablet Take 1 tablet by mouth daily.   naproxen sodium 220 MG tablet Commonly known as: ALEVE Take 220 mg by mouth daily as needed (back pain).   polyethylene glycol 17 g packet Commonly known as: MIRALAX / GLYCOLAX Take 17 g by mouth 2 (two) times daily.   QUEtiapine 25 MG tablet Commonly known as: SEROQUEL Take 0.5 tablets (12.5 mg total) by mouth daily as needed (agitation unable to redirect).   senna-docusate  8.6-50 MG tablet Commonly known as: Senokot-S Take 1 tablet by mouth 2 (two) times daily.               Discharge Care Instructions  (From admission, onward)           Start     Ordered   06/27/21 0000  Discharge wound care:       Comments: See above   06/27/21 1157   06/25/21 0000  Discharge wound care:       Comments: See above   06/25/21 0859            No Known Allergies  Consultations: Raliegh Ip, Ortho Psych    Procedures/Studies: DG Chest 1 View  Result Date: 06/12/2021 CLINICAL DATA:  Fall with right-sided hip pain EXAM: CHEST  1 VIEW COMPARISON:  03/24/2020 FINDINGS: No focal opacity or pleural effusion. Stable cardiomediastinal silhouette with aortic atherosclerosis. No pneumothorax. IMPRESSION: No active disease. Electronically Signed   By: Donavan Foil M.D.   On: 06/12/2021 17:00   DG ELBOW  COMPLETE RIGHT (3+VIEW)  Result Date: 06/15/2021 CLINICAL DATA:  Right elbow fracture EXAM: RIGHT ELBOW - COMPLETE 3+ VIEW COMPARISON:  None. FINDINGS: No displaced coronoid process fracture of the proximal right radius. No joint effusion or fat pad displacement. Alignment is normal. IMPRESSION: No displaced coronoid process fracture visible. Electronically Signed   By: Ulyses Jarred M.D.   On: 06/15/2021 02:33   DG Forearm Right  Result Date: 06/14/2021 CLINICAL DATA:  Fall EXAM: RIGHT FOREARM - 2 VIEW COMPARISON:  None. FINDINGS: Possible fracture deformity at the coronoid process of the ulna at the elbow. No malalignment. IMPRESSION: Findings are indeterminate for coronoid process fracture at the elbow, recommend dedicated views of the right elbow. These results will be called to the ordering clinician or representative by the Radiologist Assistant, and communication documented in the PACS or [employer]. Electronically Signed   By: Donavan Foil M.D.   On: 06/14/2021 20:09   CT HEAD WO CONTRAST (5MM)  Result Date: 06/25/2021 CLINICAL DATA:  Delirium  EXAM: CT HEAD WITHOUT CONTRAST TECHNIQUE: Contiguous axial images were obtained from the base of the skull through the vertex without intravenous contrast. COMPARISON:  06/12/2021 FINDINGS: Brain: There is no mass, hemorrhage or extra-axial collection. The size and configuration of the ventricles and extra-axial CSF spaces are normal. There is hypoattenuation of the white matter, most commonly indicating chronic small vessel disease. There are old infarcts of the right cerebellum and left pons, unchanged. Vascular: Atherosclerotic calcification of the internal carotid arteries at the skull base. No abnormal hyperdensity of the major intracranial arteries or dural venous sinuses. Skull: The visualized skull base, calvarium and extracranial soft tissues are normal. Sinuses/Orbits: No fluid levels or advanced mucosal thickening of the visualized paranasal sinuses. No mastoid or middle ear effusion. The orbits are normal. IMPRESSION: 1. No acute intracranial abnormality. 2. Old right cerebellar and left pontine infarcts and findings of chronic small vessel disease. Electronically Signed   By: Ulyses Jarred M.D.   On: 06/25/2021 23:17   CT Head Wo Contrast  Result Date: 06/12/2021 CLINICAL DATA:  Head injury after fall. EXAM: CT HEAD WITHOUT CONTRAST TECHNIQUE: Contiguous axial images were obtained from the base of the skull through the vertex without intravenous contrast. COMPARISON:  March 24, 2020. FINDINGS: Brain: Mild chronic ischemic white matter disease is noted. No mass effect or midline shift is noted. Ventricular size is within normal limits. There is no evidence of mass lesion, hemorrhage or acute infarction. Vascular: No hyperdense vessel or unexpected calcification. Skull: Normal. Negative for fracture or focal lesion. Sinuses/Orbits: No acute finding. Other: None. IMPRESSION: No acute intracranial abnormality seen. Electronically Signed   By: Marijo Conception M.D.   On: 06/12/2021 16:02   DG Pelvis  Portable  Result Date: 06/17/2021 CLINICAL DATA:  Postop EXAM: PORTABLE PELVIS 1-2 VIEWS COMPARISON:  06/14/2021 FINDINGS: Right hip hemiarthroplasty unchanged from the prior study. Normal alignment of the prosthesis. No fracture. The patient rotated to the right. Remainder of the pelvis negative. Pelvic calcification consistent with uterine fibroid. IMPRESSION: Right hip hemiarthroplasty.  No acute fracture in the pelvis. Electronically Signed   By: Franchot Gallo M.D.   On: 06/17/2021 12:49   DG Pelvis Portable  Result Date: 06/14/2021 CLINICAL DATA:  Right hip hemiarthroplasty EXAM: PORTABLE PELVIS 1-2 VIEWS COMPARISON:  06/12/2021 FINDINGS: Single frontal view of the pelvis was obtained, limited by portable technique. Right hip hemiarthroplasty is identified in the expected position without signs of acute complication. Remainder of the bony  pelvis is unremarkable. IMPRESSION: 1. Unremarkable right hip hemiarthroplasty. Electronically Signed   By: Randa Ngo M.D.   On: 06/14/2021 16:50   DG Ankle Right Port  Result Date: 06/13/2021 CLINICAL DATA:  Pain EXAM: PORTABLE RIGHT ANKLE - 2 VIEW COMPARISON:  None. FINDINGS: There is no evidence of fracture, dislocation, or joint effusion. There is no evidence of arthropathy or other focal bone abnormality. Soft tissue edema about the medial foot. IMPRESSION: No fracture or dislocation of the right ankle. Soft tissue edema about the medial foot. Electronically Signed   By: Eddie Candle M.D.   On: 06/13/2021 13:21   DG Humerus Right  Result Date: 06/14/2021 CLINICAL DATA:  Fall with elbow pain EXAM: RIGHT HUMERUS - 2+ VIEW COMPARISON:  None. FINDINGS: There is no evidence of fracture or other focal bone lesions. Soft tissues are unremarkable. IMPRESSION: Negative. Electronically Signed   By: Donavan Foil M.D.   On: 06/14/2021 20:09   ECHOCARDIOGRAM COMPLETE  Result Date: 06/13/2021    ECHOCARDIOGRAM REPORT   Patient Name:   Christine Christensen [facility] Date of  Exam: 06/13/2021 Medical Rec #:  [id number]        Height:       63.5 in Accession #:    [id number]       Weight:       117.0 lb Date of Birth:  11-29-1945         BSA:          1.549 m Patient Age:    41 years         BP:           142/76 mmHg Patient Gender: F                HR:           71 bpm. Exam Location:  Inpatient Procedure: 2D Echo, 3D Echo, Cardiac Doppler and Color Doppler                                 MODIFIED REPORT:   This report was modified by Lyman Bishop MD on 06/13/2021 due to Noted LVEDP      elevated - however, difficult to quantify due to heavy mitral annular              calcification - suspect LVEDP value was overestimated.  Indications:     R55 Syncope  History:         Patient has prior history of Echocardiogram examinations, most                  recent 07/27/2017. Stroke, Aortic Valve Disease and Mitral Valve                  Disease, Signs/Symptoms:Murmur and Syncope; Risk                  Factors:Hypertension. Mitral and aortic stenosis.  Sonographer:     Roseanna Rainbow RDCS Referring Phys:  [id number] Lequita Halt Diagnosing Phys: Lyman Bishop MD IMPRESSIONS  1. Left ventricular ejection fraction, by estimation, is >75%. Left ventricular ejection fraction by 2D MOD biplane is 75.8 %. The left ventricle has hyperdynamic function. The left ventricle has no regional wall motion abnormalities. There is mild left  ventricular hypertrophy. Left ventricular diastolic parameters are consistent with Grade II diastolic dysfunction (pseudonormalization). Elevated left ventricular end-diastolic pressure.  2. Right ventricular systolic function  is hyperdynamic. The right ventricular size is normal. There is severely elevated pulmonary artery systolic pressure. The estimated right ventricular systolic pressure is 26.9 mmHg.  3. The mitral valve is abnormal. Trivial mitral valve regurgitation. Severe mitral stenosis. The mean mitral valve gradient is 18.0 mmHg with average heart rate of 79 bpm. Severe  mitral annular calcification.  4. There is heavy calcification of the aortic root uncluding a calcificed mass below the aortic valve annulus in the aorta/mitral curtain. The aortic valve is tricuspid. There is mild calcification of the aortic valve. Aortic valve regurgitation is not visualized. Mild aortic valve stenosis. Aortic valve mean gradient measures 10.4 mmHg. AVA around 1.56 cm2, DI 0.50  5. The inferior vena cava is normal in size with <50% respiratory variability, suggesting right atrial pressure of 8 mmHg. Comparison(s): Changes from prior study are noted. 07/27/2017: LVEF 60-65%, mild LVH, grade 1 DD with elevated LVEDP, Moderate MS - mean gradient 10 mmHg, mild AS - mean gradient 10 mmHg. Conclusion(s)/Recommendation(s): There is severe mitral stenosis with severe pulmonary hypertension and high LV filling pressure - the aortic valve appears unchanged, however, there is heavy aortic root calcification - consider CT surgery consultation. FINDINGS  Left Ventricle: Left ventricular ejection fraction, by estimation, is >75%. Left ventricular ejection fraction by 2D MOD biplane is 75.8 %. The left ventricle has hyperdynamic function. The left ventricle has no regional wall motion abnormalities. 3D left ventricular ejection fraction analysis performed but not reported based on interpreter judgement due to suboptimal quality. The left ventricular internal cavity size was normal in size. There is mild left ventricular hypertrophy. Left ventricular diastolic parameters are consistent with Grade II diastolic dysfunction (pseudonormalization). Elevated left ventricular end-diastolic pressure. Right Ventricle: The right ventricular size is normal. No increase in right ventricular wall thickness. Right ventricular systolic function is hyperdynamic. There is severely elevated pulmonary artery systolic pressure. The tricuspid regurgitant velocity  is 3.96 m/s, and with an assumed right atrial pressure of 8 mmHg, the  estimated right ventricular systolic pressure is 48.5 mmHg. Left Atrium: Left atrial size was normal in size. Right Atrium: Right atrial size was normal in size. Pericardium: There is no evidence of pericardial effusion. Mitral Valve: The mitral valve is abnormal. Severe mitral annular calcification. Trivial mitral valve regurgitation. Severe mitral valve stenosis. MV peak gradient, 31.9 mmHg. The mean mitral valve gradient is 18.0 mmHg with average heart rate of 79 bpm. Tricuspid Valve: The tricuspid valve is grossly normal. Tricuspid valve regurgitation is mild. Aortic Valve: There is heavy calcification of the aortic root uncluding a calcificed mass below the aortic valve annulus in the aorta/mitral curtain. The aortic valve is tricuspid. There is mild calcification of the aortic valve. There is moderate to severe aortic valve annular calcification. Aortic valve regurgitation is not visualized. Mild aortic stenosis is present. Aortic valve mean gradient measures 10.4 mmHg. Aortic valve peak gradient measures 20.2 mmHg. Aortic valve area, by VTI measures 1.28 cm. Pulmonic Valve: The pulmonic valve was grossly normal. Pulmonic valve regurgitation is trivial. Aorta: The aortic root and ascending aorta are structurally normal, with no evidence of dilitation. Venous: The inferior vena cava is normal in size with less than 50% respiratory variability, suggesting right atrial pressure of 8 mmHg. IAS/Shunts: There is right bowing of the interatrial septum, suggestive of elevated left atrial pressure. No atrial level shunt detected by color flow Doppler.  LEFT VENTRICLE PLAX 2D  Biplane EF (MOD) LVIDd:         3.50 cm         LV Biplane EF:   Left LVIDs:         2.30 cm                          ventricular LV PW:         0.90 cm                          ejection LV IVS:        1.30 cm                          fraction by LVOT diam:     1.80 cm                          2D MOD LV SV:         56                                biplane is LV SV Index:   36                               75.8 %. LVOT Area:     2.54 cm                                Diastology                                LV e' medial:    3.48 cm/s LV Volumes (MOD)               LV E/e' medial:  72.7 LV vol d, MOD    24.5 ml       LV e' lateral:   4.65 cm/s A2C:                           LV E/e' lateral: 54.4 LV vol d, MOD    45.9 ml A4C: LV vol s, MOD    4.9 ml A2C: LV vol s, MOD    12.5 ml       3D Volume EF: A4C:                           3D EF:        66 % LV SV MOD A2C:   19.6 ml       LV EDV:       73 ml LV SV MOD A4C:   45.9 ml       LV ESV:       25 ml LV SV MOD BP:    25.6 ml       LV SV:        48 ml RIGHT VENTRICLE             IVC RV S prime:     13.70 cm/s  IVC diam: 1.40 cm TAPSE (M-mode): 2.1 cm LEFT ATRIUM  Index       RIGHT ATRIUM           Index LA diam:        4.40 cm 2.84 cm/m  RA Area:     11.70 cm LA Vol (A2C):   40.5 ml 26.15 ml/m RA Volume:   24.80 ml  16.01 ml/m LA Vol (A4C):   48.1 ml 31.06 ml/m LA Biplane Vol: 45.0 ml 29.06 ml/m  AORTIC VALVE AV Area (Vmax):    1.56 cm AV Area (Vmean):   1.51 cm AV Area (VTI):     1.28 cm AV Vmax:           224.80 cm/s AV Vmean:          144.000 cm/s AV VTI:            0.434 m AV Peak Grad:      20.2 mmHg AV Mean Grad:      10.4 mmHg LVOT Vmax:         138.00 cm/s LVOT Vmean:        85.400 cm/s LVOT VTI:          0.219 m LVOT/AV VTI ratio: 0.50  AORTA Ao Root diam: 2.70 cm Ao Asc diam:  2.80 cm MITRAL VALVE                TRICUSPID VALVE MV Area (PHT): 2.73 cm     TR Peak grad:   62.7 mmHg MV Area VTI:   0.57 cm     TR Vmax:        396.00 cm/s MV Peak grad:  31.9 mmHg MV Mean grad:  18.0 mmHg    SHUNTS MV Vmax:       2.82 m/s     Systemic VTI:  0.22 m MV Vmean:      197.0 cm/s   Systemic Diam: 1.80 cm MV Decel Time: 278 msec MV E velocity: 253.00 cm/s MV A velocity: 238.00 cm/s MV E/A ratio:  1.06 Lyman Bishop MD Electronically signed by Lyman Bishop MD Signature  Date/Time: 06/13/2021/10:35:48 AM    Final (Updated)    DG HIP PORT UNILAT WITH PELVIS 1V RIGHT  Result Date: 06/20/2021 CLINICAL DATA:  Status post fall. EXAM: DG HIP (WITH OR WITHOUT PELVIS) 1V PORT RIGHT COMPARISON:  None. FINDINGS: A total right hip replacement is seen without evidence of surrounding lucency to suggest the presence of hardware loosening or infection. There is no evidence of acute fracture or dislocation. Small calcified uterine fibroids are suspected. IMPRESSION: Total right hip replacement without evidence of hardware loosening or infection. Electronically Signed   By: Virgina Norfolk M.D.   On: 06/20/2021 19:29   DG Hip Unilat W or Wo Pelvis 2-3 Views Right  Result Date: 06/12/2021 CLINICAL DATA:  Right hip pain after fall. EXAM: DG HIP (WITH OR WITHOUT PELVIS) 2-3V RIGHT COMPARISON:  None. FINDINGS: Moderately displaced proximal right femoral neck fracture is noted. Left hip is unremarkable. IMPRESSION: Moderately displaced proximal right femoral neck fracture. Electronically Signed   By: Marijo Conception M.D.   On: 06/12/2021 17:00     Subjective: Alert, no complaints. Would like her breakfast to be warm.   Discharge Exam: Vitals:   06/27/21 0527 06/27/21 0834  BP: (!) 149/77 118/67  Pulse: 88 99  Resp: 18 18  Temp: 98.5 F (36.9 C) 98.9 F (37.2 C)  SpO2: 95% 96%     General: NAD Cardiovascular: RRR, S1/S2 +, no rubs, no  gallops Respiratory: CTA bilaterally, no wheezing, no rhonchi Abdominal: Soft, NT, ND, bowel sounds + Extremities: no edema, no cyanosis    The results of significant diagnostics from this hospitalization (including imaging, microbiology, ancillary and laboratory) are listed below for reference.     Microbiology: Recent Results (from the past 240 hour(s))  SARS CORONAVIRUS 2 (TAT 6-24 HRS) Nasopharyngeal Nasopharyngeal Swab     Status: None   Collection Time: 06/21/21  5:40 PM   Specimen: Nasopharyngeal Swab  Result Value Ref Range  Status   SARS Coronavirus 2 NEGATIVE NEGATIVE Final    Comment: (NOTE) SARS-CoV-2 target nucleic acids are NOT DETECTED.  The SARS-CoV-2 RNA is generally detectable in upper and lower respiratory specimens during the acute phase of infection. Negative results do not preclude SARS-CoV-2 infection, do not rule out co-infections with other pathogens, and should not be used as the sole basis for treatment or other patient management decisions. Negative results must be combined with clinical observations, patient history, and epidemiological information. The expected result is Negative.  Fact Sheet for Patients: SugarRoll.be  Fact Sheet for Healthcare Providers: https://www.woods-mathews.com/  This test is not yet approved or cleared by the [place] FDA and  has been authorized for detection and/or diagnosis of SARS-CoV-2 by FDA under an Emergency Use Authorization (EUA). This EUA will remain  in effect (meaning this test can be used) for the duration of the COVID-19 declaration under Se ction 564(b)(1) of the Act, 21 U.S.C. section 360bbb-3(b)(1), unless the authorization is terminated or revoked sooner.  Performed at [facility], [place] [address], [place], [place] [address]      Labs: BNP (last 3 results) No results for input(s): BNP in the last 8760 hours. Basic Metabolic Panel: Recent Labs  Lab 06/21/21 1034 06/24/21 0929 06/26/21 1422  NA 136 135 139  K 4.2 4.2 4.3  CL 101 101 103  CO2 27 25 25   GLUCOSE 94 111* 112*  BUN 11 18 21   CREATININE 0.57 0.64 0.62  CALCIUM 9.1 9.3 9.5  MG 1.8  --   --   PHOS 4.1  --   --    Liver Function Tests: Recent Labs  Lab 06/21/21 1034  AST 20  ALT 16  ALKPHOS 63  BILITOT 0.5  PROT 6.1*  ALBUMIN 2.6*   No results for input(s): LIPASE, AMYLASE in the last 168 hours. No results for input(s): AMMONIA in the last 168 hours. CBC: Recent Labs  Lab 06/21/21 1034  06/26/21 1422  WBC 7.4 9.5  NEUTROABS 4.5  --   HGB 10.7* 11.2*  HCT 32.5* 33.9*  MCV 87.1 87.8  PLT 366 526*   Cardiac Enzymes: No results for input(s): CKTOTAL, CKMB, CKMBINDEX, TROPONINI in the last 168 hours. BNP: Invalid input(s): POCBNP CBG: No results for input(s): GLUCAP in the last 168 hours. D-Dimer No results for input(s): DDIMER in the last 72 hours. Hgb A1c No results for input(s): HGBA1C in the last 72 hours. Lipid Profile No results for input(s): CHOL, HDL, LDLCALC, TRIG, CHOLHDL, LDLDIRECT in the last 72 hours. Thyroid function studies No results for input(s): TSH, T4TOTAL, T3FREE, THYROIDAB in the last 72 hours.  Invalid input(s): FREET3 Anemia work up No results for input(s): VITAMINB12, FOLATE, FERRITIN, TIBC, IRON, RETICCTPCT in the last 72 hours. Urinalysis    Component Value Date/Time   COLORURINE YELLOW 06/21/2021 1100   APPEARANCEUR CLEAR 06/21/2021 1100   LABSPEC 1.017 06/21/2021 1100   PHURINE 6.0 06/21/2021 1100   GLUCOSEU  NEGATIVE 06/21/2021 1100   HGBUR NEGATIVE 06/21/2021 1100   BILIRUBINUR NEGATIVE 06/21/2021 1100   KETONESUR NEGATIVE 06/21/2021 1100   PROTEINUR NEGATIVE 06/21/2021 1100   NITRITE NEGATIVE 06/21/2021 1100   LEUKOCYTESUR NEGATIVE 06/21/2021 1100   Sepsis Labs Invalid input(s): PROCALCITONIN,  WBC,  LACTICIDVEN Microbiology Recent Results (from the past 240 hour(s))  SARS CORONAVIRUS 2 (TAT 6-24 HRS) Nasopharyngeal Nasopharyngeal Swab     Status: None   Collection Time: 06/21/21  5:40 PM   Specimen: Nasopharyngeal Swab  Result Value Ref Range Status   SARS Coronavirus 2 NEGATIVE NEGATIVE Final    Comment: (NOTE) SARS-CoV-2 target nucleic acids are NOT DETECTED.  The SARS-CoV-2 RNA is generally detectable in upper and lower respiratory specimens during the acute phase of infection. Negative results do not preclude SARS-CoV-2 infection, do not rule out co-infections with other pathogens, and should not be used as  the sole basis for treatment or other patient management decisions. Negative results must be combined with clinical observations, patient history, and epidemiological information. The expected result is Negative.  Fact Sheet for Patients: SugarRoll.be  Fact Sheet for Healthcare Providers: https://www.woods-mathews.com/  This test is not yet approved or cleared by the [place] FDA and  has been authorized for detection and/or diagnosis of SARS-CoV-2 by FDA under an Emergency Use Authorization (EUA). This EUA will remain  in effect (meaning this test can be used) for the duration of the COVID-19 declaration under Se ction 564(b)(1) of the Act, 21 U.S.C. section 360bbb-3(b)(1), unless the authorization is terminated or revoked sooner.  Performed at [facility], [place] [address], [place], [place] [address]      Time coordinating discharge: 40 minutes  SIGNED:   Elmarie Shiley, MD  [facility]

## 2021-06-27 NOTE — TOC Progression Note (Signed)
Transition of Care [facility]) - Progression Note    Patient Details  Name: ETOLA MULL MRN: [id number] Date of Birth: 03/20/1946  [facility]) CM/SW Contact  Reece Agar, [place] Phone Number: 06/27/2021, 4:03 PM  Clinical Narrative:    Helene Kelp at [facility] contacted CSW to provide information on pt DC for today. Helene Kelp shared that pt Josem Kaufmann was not complete and that there is now a new company to [employer] through for SNF specifically ([employer]). Helene Kelp stares that pt can DC tomorrow.   CSW contacted pt son to provide updates. CSW will continue to follow.        Expected Discharge Plan and Services           Expected Discharge Date: 06/27/21                                     Social Determinants of Health (SDOH) Interventions    Readmission Risk Interventions No flowsheet data found.

## 2021-06-27 NOTE — Progress Notes (Signed)
Ate food from [facility] brought by relatives.

## 2021-06-27 NOTE — Progress Notes (Signed)
Discharge to SNF cancelled for today, as per MSW still waiting for insurance authorization.MD made aware,

## 2021-06-27 NOTE — Progress Notes (Signed)
Physical Therapy Treatment Patient Details Name: Christine Christensen MRN: [id number] DOB: May 25, 1946 Today's Date: 06/27/2021   History of Present Illness Pt is a 75 y.o. female admitted 06/12/21 with fall at home; found to have R femoral neck fx. Sx delayed as pt deemed high risk; pre-op cardiology evaluation for near syncope, mitral stenosis. S/p R hip hemiarthroplasty on 9/27. Pt guarding R arm post-op; RUE imaging negative for acute injury. PMH includes vascular dementia, HTN, CVAs with residual R-side weakness (2005, 2008), Meniere's disease, bilateral TKAs.    PT Comments    The pt continues to make good progress with OOB mobility this session, but remains limited by ongoing confusion as well as significant weakness in R hip, knee, and no active movement at R ankle. The pt was able to intermittently follow commands for movement, but requires increased cues, increased processing time, and sequential cues. With any break in cues given, the pt stops moving and is unable to problem-solve how to continue. Discussed delirium and prevention strategies with son who was present and appropriately concerned given pt's change in cognition at this time. Continue to recommend SNF for rehab to further progress functional strengthening and mobility prior to return home.     Recommendations for follow up therapy are one component of a multi-disciplinary discharge planning process, led by the attending physician.  Recommendations may be updated based on patient status, additional functional criteria and insurance authorization.  Follow Up Recommendations  SNF     Equipment Recommendations   (defer to post acute)    Recommendations for Other Services       Precautions / Restrictions Precautions Precautions: Fall;Posterior Hip Precaution Booklet Issued: Yes (comment) Precaution Comments: pt unable to recall any hip precautions at this time Required Braces or Orthoses: Other Brace Other Brace: abduction  wedge Restrictions Weight Bearing Restrictions: Yes RLE Weight Bearing: Weight bearing as tolerated Other Position/Activity Restrictions: posterior hip precautions RLE     Mobility  Bed Mobility Overal bed mobility: Needs Assistance Bed Mobility: Supine to Sit     Supine to sit: Min assist     General bed mobility comments: minA to complete, minA to BLE and to elevate trunk from [facility]    Transfers Overall transfer level: Needs assistance Equipment used: Rolling walker (2 wheeled) Transfers: Sit to/from Stand Sit to Stand: Mod assist         General transfer comment: modA with repeated verbal and tactile cues for hand positioning, modA and significantly slowed power up to stand  Ambulation/Gait Ambulation/Gait assistance: Min assist;Min guard Gait Distance (Feet): 125 Feet Assistive device: Rolling walker (2 wheeled) Gait Pattern/deviations: Step-to pattern;Step-through pattern;Decreased step length - right;Decreased step length - left;Decreased stance time - right;Decreased stride length Gait velocity: 0.06 m/s Gait velocity interpretation: <1.31 ft/sec, indicative of household ambulator General Gait Details: minA to R knee to reduce hip IR initially, pt with increased hip flexion and placing RLE in front and wide with each step, good attention to foot placement on the ground, no instances of knee buckling.       Balance Overall balance assessment: Needs assistance Sitting-balance support: Bilateral upper extremity supported Sitting balance-Leahy Scale: Fair Sitting balance - Comments: minG for safety, pt leaning backwards at times   Standing balance support: Bilateral upper extremity supported;During functional activity Standing balance-Leahy Scale: Poor Standing balance comment: benefits from BUE support, able to complete short bouts with single UE support, BUE support needed for gait  Cognition Arousal/Alertness:  Awake/alert Behavior During Therapy: WFL for tasks assessed/performed Overall Cognitive Status: Impaired/Different from baseline Area of Impairment: Attention;Memory;Following commands;Safety/judgement;Awareness;Problem solving                   Current Attention Level: Focused Memory: Decreased short-term memory;Decreased recall of precautions Following Commands: Follows one step commands with increased time;Follows one step commands inconsistently Safety/Judgement: Decreased awareness of safety;Decreased awareness of deficits Awareness: Intellectual Problem Solving: Slow processing;Decreased initiation;Difficulty sequencing;Requires verbal cues General Comments: pt oriented, but with multiple moments of increased confusion during the session. For example walking in hallway, pt possibly attempting to follow floor tiles, but stating "I am so confused, I don't know how to do that" when instructed to walk through the door of her room (pt kept turning 90deg to L and R, not walking forwards through open door) discussed delirum and precautions with son who is concerned about continued bouts of confusion      Exercises Total Joint Exercises Ankle Circles/Pumps: AAROM;Right;15 reps;Supine;Seated;AROM Heel Slides: AAROM;Right;10 reps;Supine Hip ABduction/ADduction: AAROM;Right;10 reps;Standing Knee Flexion: AAROM;Right;10 reps;Standing    General Comments General comments (skin integrity, edema, etc.): pt continues to rest with significant IR RLE and PF and inversion at ankle. positioned to neutral as best as possible      Pertinent Vitals/Pain Pain Assessment: Faces Faces Pain Scale: Hurts a little bit Pain Location: R LE at hip Pain Descriptors / Indicators: Grimacing;Guarding;Discomfort Pain Intervention(s): Limited activity within patient's tolerance;Monitored during session;Repositioned     PT Goals (current goals can now be found in the care plan section) Acute Rehab PT  Goals Patient Stated Goal: get stronger PT Goal Formulation: With patient/family Time For Goal Achievement: 06/29/21 Potential to Achieve Goals: Fair Progress towards PT goals: Progressing toward goals    Frequency    Min 3X/week      PT Plan Current plan remains appropriate       AM-PAC PT "6 Clicks" Mobility   Outcome Measure  Help needed turning from your back to your side while in a flat bed without using bedrails?: A Lot Help needed moving from lying on your back to sitting on the side of a flat bed without using bedrails?: A Lot Help needed moving to and from a bed to a chair (including a wheelchair)?: A Lot Help needed standing up from a chair using your arms (e.g., wheelchair or bedside chair)?: A Lot Help needed to walk in hospital room?: A Little Help needed climbing 3-5 steps with a railing? : Total 6 Click Score: 12    End of Session Equipment Utilized During Treatment: Gait belt Activity Tolerance: Patient tolerated treatment well Patient left: in chair;with call bell/phone within reach;with chair alarm set;with family/visitor present Nurse Communication: Mobility status PT Visit Diagnosis: Other abnormalities of gait and mobility (R26.89);Muscle weakness (generalized) (M62.81);Pain Pain - Right/Left: Right Pain - part of body: Leg     Time: 1121-1209 PT Time Calculation (min) (ACUTE ONLY): 48 min  Charges:  $Gait Training: 23-37 mins $Therapeutic Exercise: 8-22 mins                     West Carbo, PT, DPT   Acute Rehabilitation Department Pager #: [phone]   Sandra Cockayne 06/27/2021, 12:18 PM

## 2021-06-28 NOTE — Plan of Care (Signed)
  Problem: Health Behavior/Discharge Planning: Goal: Ability to manage health-related needs will improve Outcome: Progressing   Problem: Activity: Goal: Risk for activity intolerance will decrease Outcome: Progressing   

## 2021-06-28 NOTE — Discharge Summary (Signed)
Physician Discharge Summary  Christine Christensen FAO:[id number] DOB: February 27, 1946 DOA: 06/12/2021  PCP: Nolene Ebbs, MD  Admit date: 06/12/2021 Discharge date: 06/27/2021  Admitted From: Home  Disposition:  SNF  Recommendations for Outpatient Follow-up:  Follow up with PCP in 1-2 weeks Please obtain BMP/CBC in one week Needs to follow up with Ortho post sx 2 weeks.  Delirium Precaution.  WBAT with posterior hip precautions x6 weeks, hip abduction pillow to be used when in bed. No Knee Immobilizer. Lovenox for DVT prophylaxis 4 weeks.    Discharge Condition: Stable.  CODE STATUS: Full Code Diet recommendation: Heart Healthy    Brief/Interim Summary: 75 year old with past medical history significant for CVA with right-sided weakness, hypertension, vascular dementia, Mnire's disease, moderate mitral stenosis who presents after sudden fall in her kitchen and found to have a right femoral neck fracture.  Patient underwent right hip hemiarthroplasty on 9/27 after undergoing cardiac evaluation and taking risk of surgery.  Patient hospital course complicated by confusion and hypoactive delirium and was a slowly to improve.  Patient became more confused and agitated overnight 10/3, she required a dose of Ativan.  She was evaluated by psych who recommended delirium precaution and to hold Remeron. She Has improved, she is more calm.  Awaiting skilled nursing facility for rehab    1-Close traumatic right hip fracture: Right hip hemiarthroplasty 9/27 -Weightbearing as tolerated. -Plan for skilled nursing facility. Tylenol as needed for pain -Need Lovenox for DVT prophylaxis for 4 weeks -awaiting for transfer to rehab for last several days/.   2-Severe calcific mitral valve stenosis with severe pulmonary hypertension and mild aortic valve stenosis: Evaluated by cardiology who recommended conservative management. Patient was a started on low-dose beta-blocker. Stable.   3-History of stroke,  Right Hemiparesis  -On aspirin.  -CT head without acute finding.   Essential HTN; On metoprolol.    Acute Metabolic Encephalopathy: in setting of vascular dementia. Delirium post op, medication, anesthesia.  Delirium precaution.  Patient initially had hypoactive delirium, opioid and Remeron discontinued.  Subsequently she had an episode of agitation.  Received a dose of Ativan. -Patient B12 normal. -UA negative.  Repeated Ct head, stable.  -Evaluated by psych Remeron held, Seroquel PRN for agitation.  -sundowning. Respond to low dose Seroquel.  Plan to transfer to SNF   See below wound care note Pressure Injury 06/16/21 Buttocks Left Stage 2 -  Partial thickness loss of dermis presenting as a shallow open injury with a red, pink wound bed without slough. approx 72mm (Active)  06/16/21 0000  Location: Buttocks  Location Orientation: Left  Staging: Stage 2 -  Partial thickness loss of dermis presenting as a shallow open injury with a red, pink wound bed without slough.  Wound Description (Comments): approx 75mm  Present on Admission: No    Severe Protein Malnutrition. On supplement.    Nutrition Problem: Severe Malnutrition Etiology: chronic illness (dementia and CVA)       Signs/Symptoms: severe fat depletion, severe muscle depletion       Interventions: Ensure Enlive (each supplement provides 350kcal and 20 grams of protein), Liberalize Diet   Estimated body mass index is 20.7 kg/m as calculated from the following:   Height as of this encounter: 5\' 3"  (1.6 m).   Weight as of this encounter: 53 kg.     Discharge Diagnoses:  Active Problems:   Closed right hip fracture, initial encounter (HCC)   Hip fracture (HCC)   Protein-calorie malnutrition, severe   Pressure injury of skin  Delirium due to another medical condition   Dementia with behavioral disturbance    Discharge Instructions  Discharge Instructions     Diet - low sodium heart healthy   Complete by:  As directed    Diet - low sodium heart healthy   Complete by: As directed    Discharge wound care:   Complete by: As directed    See above   Discharge wound care:   Complete by: As directed    See above   Increase activity slowly   Complete by: As directed    Increase activity slowly   Complete by: As directed       Allergies as of 06/27/2021   No Known Allergies      Medication List     STOP taking these medications    aspirin 325 MG EC tablet Replaced by: aspirin 325 MG tablet   mirtazapine 7.5 MG tablet Commonly known as: REMERON       TAKE these medications    acetaminophen 500 MG tablet Commonly known as: TYLENOL Take 2 tablets (1,000 mg total) by mouth every 6 (six) hours as needed for mild pain, moderate pain, fever or headache.   aspirin 325 MG tablet Take 1 tablet (325 mg total) by mouth daily. Replaces: aspirin 325 MG EC tablet   enoxaparin 40 MG/0.4ML injection Commonly known as: LOVENOX Inject 0.4 mLs (40 mg total) into the skin daily.   escitalopram 20 MG tablet Commonly known as: LEXAPRO Take 20 mg daily by mouth.   feeding supplement Liqd Take 237 mLs by mouth 2 (two) times daily between meals.   fexofenadine 180 MG tablet Commonly known as: ALLEGRA Take 180 mg by mouth daily as needed for allergies or rhinitis.   melatonin 3 MG Tabs tablet Take 1 tablet (3 mg total) by mouth at bedtime.   metoprolol tartrate 25 MG tablet Commonly known as: LOPRESSOR Take 0.5 tablets (12.5 mg total) by mouth 2 (two) times daily.   multivitamin with minerals Tabs tablet Take 1 tablet by mouth daily.   naproxen sodium 220 MG tablet Commonly known as: ALEVE Take 220 mg by mouth daily as needed (back pain).   polyethylene glycol 17 g packet Commonly known as: MIRALAX / GLYCOLAX Take 17 g by mouth 2 (two) times daily.   QUEtiapine 25 MG tablet Commonly known as: SEROQUEL Take 0.5 tablets (12.5 mg total) by mouth daily as needed (agitation  unable to redirect).   senna-docusate 8.6-50 MG tablet Commonly known as: Senokot-S Take 1 tablet by mouth 2 (two) times daily.               Discharge Care Instructions  (From admission, onward)           Start     Ordered   06/27/21 0000  Discharge wound care:       Comments: See above   06/27/21 1157   06/25/21 0000  Discharge wound care:       Comments: See above   06/25/21 0859            No Known Allergies  Consultations: Raliegh Ip, Ortho Psych    Procedures/Studies: DG Chest 1 View  Result Date: 06/12/2021 CLINICAL DATA:  Fall with right-sided hip pain EXAM: CHEST  1 VIEW COMPARISON:  03/24/2020 FINDINGS: No focal opacity or pleural effusion. Stable cardiomediastinal silhouette with aortic atherosclerosis. No pneumothorax. IMPRESSION: No active disease. Electronically Signed   By: Donavan Foil M.D.   On: 06/12/2021 17:00  DG ELBOW COMPLETE RIGHT (3+VIEW)  Result Date: 06/15/2021 CLINICAL DATA:  Right elbow fracture EXAM: RIGHT ELBOW - COMPLETE 3+ VIEW COMPARISON:  None. FINDINGS: No displaced coronoid process fracture of the proximal right radius. No joint effusion or fat pad displacement. Alignment is normal. IMPRESSION: No displaced coronoid process fracture visible. Electronically Signed   By: Ulyses Jarred M.D.   On: 06/15/2021 02:33   DG Forearm Right  Result Date: 06/14/2021 CLINICAL DATA:  Fall EXAM: RIGHT FOREARM - 2 VIEW COMPARISON:  None. FINDINGS: Possible fracture deformity at the coronoid process of the ulna at the elbow. No malalignment. IMPRESSION: Findings are indeterminate for coronoid process fracture at the elbow, recommend dedicated views of the right elbow. These results will be called to the ordering clinician or representative by the Radiologist Assistant, and communication documented in the PACS or [employer]. Electronically Signed   By: Donavan Foil M.D.   On: 06/14/2021 20:09   CT HEAD WO CONTRAST (5MM)  Result  Date: 06/25/2021 CLINICAL DATA:  Delirium EXAM: CT HEAD WITHOUT CONTRAST TECHNIQUE: Contiguous axial images were obtained from the base of the skull through the vertex without intravenous contrast. COMPARISON:  06/12/2021 FINDINGS: Brain: There is no mass, hemorrhage or extra-axial collection. The size and configuration of the ventricles and extra-axial CSF spaces are normal. There is hypoattenuation of the white matter, most commonly indicating chronic small vessel disease. There are old infarcts of the right cerebellum and left pons, unchanged. Vascular: Atherosclerotic calcification of the internal carotid arteries at the skull base. No abnormal hyperdensity of the major intracranial arteries or dural venous sinuses. Skull: The visualized skull base, calvarium and extracranial soft tissues are normal. Sinuses/Orbits: No fluid levels or advanced mucosal thickening of the visualized paranasal sinuses. No mastoid or middle ear effusion. The orbits are normal. IMPRESSION: 1. No acute intracranial abnormality. 2. Old right cerebellar and left pontine infarcts and findings of chronic small vessel disease. Electronically Signed   By: Ulyses Jarred M.D.   On: 06/25/2021 23:17   CT Head Wo Contrast  Result Date: 06/12/2021 CLINICAL DATA:  Head injury after fall. EXAM: CT HEAD WITHOUT CONTRAST TECHNIQUE: Contiguous axial images were obtained from the base of the skull through the vertex without intravenous contrast. COMPARISON:  March 24, 2020. FINDINGS: Brain: Mild chronic ischemic white matter disease is noted. No mass effect or midline shift is noted. Ventricular size is within normal limits. There is no evidence of mass lesion, hemorrhage or acute infarction. Vascular: No hyperdense vessel or unexpected calcification. Skull: Normal. Negative for fracture or focal lesion. Sinuses/Orbits: No acute finding. Other: None. IMPRESSION: No acute intracranial abnormality seen. Electronically Signed   By: Marijo Conception M.D.    On: 06/12/2021 16:02   DG Pelvis Portable  Result Date: 06/17/2021 CLINICAL DATA:  Postop EXAM: PORTABLE PELVIS 1-2 VIEWS COMPARISON:  06/14/2021 FINDINGS: Right hip hemiarthroplasty unchanged from the prior study. Normal alignment of the prosthesis. No fracture. The patient rotated to the right. Remainder of the pelvis negative. Pelvic calcification consistent with uterine fibroid. IMPRESSION: Right hip hemiarthroplasty.  No acute fracture in the pelvis. Electronically Signed   By: Franchot Gallo M.D.   On: 06/17/2021 12:49   DG Pelvis Portable  Result Date: 06/14/2021 CLINICAL DATA:  Right hip hemiarthroplasty EXAM: PORTABLE PELVIS 1-2 VIEWS COMPARISON:  06/12/2021 FINDINGS: Single frontal view of the pelvis was obtained, limited by portable technique. Right hip hemiarthroplasty is identified in the expected position without signs of acute complication. Remainder of  the bony pelvis is unremarkable. IMPRESSION: 1. Unremarkable right hip hemiarthroplasty. Electronically Signed   By: Randa Ngo M.D.   On: 06/14/2021 16:50   DG Ankle Right Port  Result Date: 06/13/2021 CLINICAL DATA:  Pain EXAM: PORTABLE RIGHT ANKLE - 2 VIEW COMPARISON:  None. FINDINGS: There is no evidence of fracture, dislocation, or joint effusion. There is no evidence of arthropathy or other focal bone abnormality. Soft tissue edema about the medial foot. IMPRESSION: No fracture or dislocation of the right ankle. Soft tissue edema about the medial foot. Electronically Signed   By: Eddie Candle M.D.   On: 06/13/2021 13:21   DG Humerus Right  Result Date: 06/14/2021 CLINICAL DATA:  Fall with elbow pain EXAM: RIGHT HUMERUS - 2+ VIEW COMPARISON:  None. FINDINGS: There is no evidence of fracture or other focal bone lesions. Soft tissues are unremarkable. IMPRESSION: Negative. Electronically Signed   By: Donavan Foil M.D.   On: 06/14/2021 20:09   ECHOCARDIOGRAM COMPLETE  Result Date: 06/13/2021    ECHOCARDIOGRAM REPORT    Patient Name:   KEISY STRICKLER [facility] Date of Exam: 06/13/2021 Medical Rec #:  [id number]        Height:       63.5 in Accession #:    [id number]       Weight:       117.0 lb Date of Birth:  Aug 27, 1946         BSA:          1.549 m Patient Age:    56 years         BP:           142/76 mmHg Patient Gender: F                HR:           71 bpm. Exam Location:  Inpatient Procedure: 2D Echo, 3D Echo, Cardiac Doppler and Color Doppler                                 MODIFIED REPORT:   This report was modified by Lyman Bishop MD on 06/13/2021 due to Noted LVEDP      elevated - however, difficult to quantify due to heavy mitral annular              calcification - suspect LVEDP value was overestimated.  Indications:     R55 Syncope  History:         Patient has prior history of Echocardiogram examinations, most                  recent 07/27/2017. Stroke, Aortic Valve Disease and Mitral Valve                  Disease, Signs/Symptoms:Murmur and Syncope; Risk                  Factors:Hypertension. Mitral and aortic stenosis.  Sonographer:     Roseanna Rainbow RDCS Referring Phys:  [id number] Lequita Halt Diagnosing Phys: Lyman Bishop MD IMPRESSIONS  1. Left ventricular ejection fraction, by estimation, is >75%. Left ventricular ejection fraction by 2D MOD biplane is 75.8 %. The left ventricle has hyperdynamic function. The left ventricle has no regional wall motion abnormalities. There is mild left  ventricular hypertrophy. Left ventricular diastolic parameters are consistent with Grade II diastolic dysfunction (pseudonormalization). Elevated left ventricular end-diastolic pressure.  2. Right ventricular  systolic function is hyperdynamic. The right ventricular size is normal. There is severely elevated pulmonary artery systolic pressure. The estimated right ventricular systolic pressure is 21.1 mmHg.  3. The mitral valve is abnormal. Trivial mitral valve regurgitation. Severe mitral stenosis. The mean mitral valve gradient is 18.0 mmHg with  average heart rate of 79 bpm. Severe mitral annular calcification.  4. There is heavy calcification of the aortic root uncluding a calcificed mass below the aortic valve annulus in the aorta/mitral curtain. The aortic valve is tricuspid. There is mild calcification of the aortic valve. Aortic valve regurgitation is not visualized. Mild aortic valve stenosis. Aortic valve mean gradient measures 10.4 mmHg. AVA around 1.56 cm2, DI 0.50  5. The inferior vena cava is normal in size with <50% respiratory variability, suggesting right atrial pressure of 8 mmHg. Comparison(s): Changes from prior study are noted. 07/27/2017: LVEF 60-65%, mild LVH, grade 1 DD with elevated LVEDP, Moderate MS - mean gradient 10 mmHg, mild AS - mean gradient 10 mmHg. Conclusion(s)/Recommendation(s): There is severe mitral stenosis with severe pulmonary hypertension and high LV filling pressure - the aortic valve appears unchanged, however, there is heavy aortic root calcification - consider CT surgery consultation. FINDINGS  Left Ventricle: Left ventricular ejection fraction, by estimation, is >75%. Left ventricular ejection fraction by 2D MOD biplane is 75.8 %. The left ventricle has hyperdynamic function. The left ventricle has no regional wall motion abnormalities. 3D left ventricular ejection fraction analysis performed but not reported based on interpreter judgement due to suboptimal quality. The left ventricular internal cavity size was normal in size. There is mild left ventricular hypertrophy. Left ventricular diastolic parameters are consistent with Grade II diastolic dysfunction (pseudonormalization). Elevated left ventricular end-diastolic pressure. Right Ventricle: The right ventricular size is normal. No increase in right ventricular wall thickness. Right ventricular systolic function is hyperdynamic. There is severely elevated pulmonary artery systolic pressure. The tricuspid regurgitant velocity  is 3.96 m/s, and with an assumed  right atrial pressure of 8 mmHg, the estimated right ventricular systolic pressure is 94.1 mmHg. Left Atrium: Left atrial size was normal in size. Right Atrium: Right atrial size was normal in size. Pericardium: There is no evidence of pericardial effusion. Mitral Valve: The mitral valve is abnormal. Severe mitral annular calcification. Trivial mitral valve regurgitation. Severe mitral valve stenosis. MV peak gradient, 31.9 mmHg. The mean mitral valve gradient is 18.0 mmHg with average heart rate of 79 bpm. Tricuspid Valve: The tricuspid valve is grossly normal. Tricuspid valve regurgitation is mild. Aortic Valve: There is heavy calcification of the aortic root uncluding a calcificed mass below the aortic valve annulus in the aorta/mitral curtain. The aortic valve is tricuspid. There is mild calcification of the aortic valve. There is moderate to severe aortic valve annular calcification. Aortic valve regurgitation is not visualized. Mild aortic stenosis is present. Aortic valve mean gradient measures 10.4 mmHg. Aortic valve peak gradient measures 20.2 mmHg. Aortic valve area, by VTI measures 1.28 cm. Pulmonic Valve: The pulmonic valve was grossly normal. Pulmonic valve regurgitation is trivial. Aorta: The aortic root and ascending aorta are structurally normal, with no evidence of dilitation. Venous: The inferior vena cava is normal in size with less than 50% respiratory variability, suggesting right atrial pressure of 8 mmHg. IAS/Shunts: There is right bowing of the interatrial septum, suggestive of elevated left atrial pressure. No atrial level shunt detected by color flow Doppler.  LEFT VENTRICLE PLAX 2D  Biplane EF (MOD) LVIDd:         3.50 cm         LV Biplane EF:   Left LVIDs:         2.30 cm                          ventricular LV PW:         0.90 cm                          ejection LV IVS:        1.30 cm                          fraction by LVOT diam:     1.80 cm                           2D MOD LV SV:         56                               biplane is LV SV Index:   36                               75.8 %. LVOT Area:     2.54 cm                                Diastology                                LV e' medial:    3.48 cm/s LV Volumes (MOD)               LV E/e' medial:  72.7 LV vol d, MOD    24.5 ml       LV e' lateral:   4.65 cm/s A2C:                           LV E/e' lateral: 54.4 LV vol d, MOD    45.9 ml A4C: LV vol s, MOD    4.9 ml A2C: LV vol s, MOD    12.5 ml       3D Volume EF: A4C:                           3D EF:        66 % LV SV MOD A2C:   19.6 ml       LV EDV:       73 ml LV SV MOD A4C:   45.9 ml       LV ESV:       25 ml LV SV MOD BP:    25.6 ml       LV SV:        48 ml RIGHT VENTRICLE             IVC RV S prime:     13.70 cm/s  IVC diam: 1.40 cm TAPSE (M-mode): 2.1 cm LEFT ATRIUM  Index       RIGHT ATRIUM           Index LA diam:        4.40 cm 2.84 cm/m  RA Area:     11.70 cm LA Vol (A2C):   40.5 ml 26.15 ml/m RA Volume:   24.80 ml  16.01 ml/m LA Vol (A4C):   48.1 ml 31.06 ml/m LA Biplane Vol: 45.0 ml 29.06 ml/m  AORTIC VALVE AV Area (Vmax):    1.56 cm AV Area (Vmean):   1.51 cm AV Area (VTI):     1.28 cm AV Vmax:           224.80 cm/s AV Vmean:          144.000 cm/s AV VTI:            0.434 m AV Peak Grad:      20.2 mmHg AV Mean Grad:      10.4 mmHg LVOT Vmax:         138.00 cm/s LVOT Vmean:        85.400 cm/s LVOT VTI:          0.219 m LVOT/AV VTI ratio: 0.50  AORTA Ao Root diam: 2.70 cm Ao Asc diam:  2.80 cm MITRAL VALVE                TRICUSPID VALVE MV Area (PHT): 2.73 cm     TR Peak grad:   62.7 mmHg MV Area VTI:   0.57 cm     TR Vmax:        396.00 cm/s MV Peak grad:  31.9 mmHg MV Mean grad:  18.0 mmHg    SHUNTS MV Vmax:       2.82 m/s     Systemic VTI:  0.22 m MV Vmean:      197.0 cm/s   Systemic Diam: 1.80 cm MV Decel Time: 278 msec MV E velocity: 253.00 cm/s MV A velocity: 238.00 cm/s MV E/A ratio:  1.06 Lyman Bishop MD Electronically  signed by Lyman Bishop MD Signature Date/Time: 06/13/2021/10:35:48 AM    Final (Updated)    DG HIP PORT UNILAT WITH PELVIS 1V RIGHT  Result Date: 06/20/2021 CLINICAL DATA:  Status post fall. EXAM: DG HIP (WITH OR WITHOUT PELVIS) 1V PORT RIGHT COMPARISON:  None. FINDINGS: A total right hip replacement is seen without evidence of surrounding lucency to suggest the presence of hardware loosening or infection. There is no evidence of acute fracture or dislocation. Small calcified uterine fibroids are suspected. IMPRESSION: Total right hip replacement without evidence of hardware loosening or infection. Electronically Signed   By: Virgina Norfolk M.D.   On: 06/20/2021 19:29   DG Hip Unilat W or Wo Pelvis 2-3 Views Right  Result Date: 06/12/2021 CLINICAL DATA:  Right hip pain after fall. EXAM: DG HIP (WITH OR WITHOUT PELVIS) 2-3V RIGHT COMPARISON:  None. FINDINGS: Moderately displaced proximal right femoral neck fracture is noted. Left hip is unremarkable. IMPRESSION: Moderately displaced proximal right femoral neck fracture. Electronically Signed   By: Marijo Conception M.D.   On: 06/12/2021 17:00     Subjective: Alert, oriented times 3, pain controlled.   Discharge Exam: Vitals:   06/27/21 0527 06/27/21 0834  BP: (!) 149/77 118/67  Pulse: 88 99  Resp: 18 18  Temp: 98.5 F (36.9 C) 98.9 F (37.2 C)  SpO2: 95% 96%     General: NAD Cardiovascular: S 1, S 2 RRR Respiratory: CTA Abdominal: Soft, nt,  nd Extremities: No edema    The results of significant diagnostics from this hospitalization (including imaging, microbiology, ancillary and laboratory) are listed below for reference.     Microbiology: Recent Results (from the past 240 hour(s))  SARS CORONAVIRUS 2 (TAT 6-24 HRS) Nasopharyngeal Nasopharyngeal Swab     Status: None   Collection Time: 06/21/21  5:40 PM   Specimen: Nasopharyngeal Swab  Result Value Ref Range Status   SARS Coronavirus 2 NEGATIVE NEGATIVE Final    Comment:  (NOTE) SARS-CoV-2 target nucleic acids are NOT DETECTED.  The SARS-CoV-2 RNA is generally detectable in upper and lower respiratory specimens during the acute phase of infection. Negative results do not preclude SARS-CoV-2 infection, do not rule out co-infections with other pathogens, and should not be used as the sole basis for treatment or other patient management decisions. Negative results must be combined with clinical observations, patient history, and epidemiological information. The expected result is Negative.  Fact Sheet for Patients: SugarRoll.be  Fact Sheet for Healthcare Providers: https://www.woods-mathews.com/  This test is not yet approved or cleared by the [place] FDA and  has been authorized for detection and/or diagnosis of SARS-CoV-2 by FDA under an Emergency Use Authorization (EUA). This EUA will remain  in effect (meaning this test can be used) for the duration of the COVID-19 declaration under Se ction 564(b)(1) of the Act, 21 U.S.C. section 360bbb-3(b)(1), unless the authorization is terminated or revoked sooner.  Performed at [facility], [place] [address], [place], [place] [address]      Labs: BNP (last 3 results) No results for input(s): BNP in the last 8760 hours. Basic Metabolic Panel: Recent Labs  Lab 06/21/21 1034 06/24/21 0929 06/26/21 1422  NA 136 135 139  K 4.2 4.2 4.3  CL 101 101 103  CO2 27 25 25   GLUCOSE 94 111* 112*  BUN 11 18 21   CREATININE 0.57 0.64 0.62  CALCIUM 9.1 9.3 9.5  MG 1.8  --   --   PHOS 4.1  --   --    Liver Function Tests: Recent Labs  Lab 06/21/21 1034  AST 20  ALT 16  ALKPHOS 63  BILITOT 0.5  PROT 6.1*  ALBUMIN 2.6*   No results for input(s): LIPASE, AMYLASE in the last 168 hours. No results for input(s): AMMONIA in the last 168 hours. CBC: Recent Labs  Lab 06/21/21 1034 06/26/21 1422  WBC 7.4 9.5  NEUTROABS 4.5  --   HGB 10.7* 11.2*  HCT  32.5* 33.9*  MCV 87.1 87.8  PLT 366 526*   Cardiac Enzymes: No results for input(s): CKTOTAL, CKMB, CKMBINDEX, TROPONINI in the last 168 hours. BNP: Invalid input(s): POCBNP CBG: No results for input(s): GLUCAP in the last 168 hours. D-Dimer No results for input(s): DDIMER in the last 72 hours. Hgb A1c No results for input(s): HGBA1C in the last 72 hours. Lipid Profile No results for input(s): CHOL, HDL, LDLCALC, TRIG, CHOLHDL, LDLDIRECT in the last 72 hours. Thyroid function studies No results for input(s): TSH, T4TOTAL, T3FREE, THYROIDAB in the last 72 hours.  Invalid input(s): FREET3 Anemia work up No results for input(s): VITAMINB12, FOLATE, FERRITIN, TIBC, IRON, RETICCTPCT in the last 72 hours. Urinalysis    Component Value Date/Time   COLORURINE YELLOW 06/21/2021 1100   APPEARANCEUR CLEAR 06/21/2021 1100   LABSPEC 1.017 06/21/2021 1100   PHURINE 6.0 06/21/2021 1100   GLUCOSEU NEGATIVE 06/21/2021 1100   HGBUR NEGATIVE 06/21/2021 1100   BILIRUBINUR NEGATIVE 06/21/2021 1100  KETONESUR NEGATIVE 06/21/2021 1100   PROTEINUR NEGATIVE 06/21/2021 1100   NITRITE NEGATIVE 06/21/2021 1100   LEUKOCYTESUR NEGATIVE 06/21/2021 1100   Sepsis Labs Invalid input(s): PROCALCITONIN,  WBC,  LACTICIDVEN Microbiology Recent Results (from the past 240 hour(s))  SARS CORONAVIRUS 2 (TAT 6-24 HRS) Nasopharyngeal Nasopharyngeal Swab     Status: None   Collection Time: 06/21/21  5:40 PM   Specimen: Nasopharyngeal Swab  Result Value Ref Range Status   SARS Coronavirus 2 NEGATIVE NEGATIVE Final    Comment: (NOTE) SARS-CoV-2 target nucleic acids are NOT DETECTED.  The SARS-CoV-2 RNA is generally detectable in upper and lower respiratory specimens during the acute phase of infection. Negative results do not preclude SARS-CoV-2 infection, do not rule out co-infections with other pathogens, and should not be used as the sole basis for treatment or other patient management decisions. Negative  results must be combined with clinical observations, patient history, and epidemiological information. The expected result is Negative.  Fact Sheet for Patients: SugarRoll.be  Fact Sheet for Healthcare Providers: https://www.woods-mathews.com/  This test is not yet approved or cleared by the [place] FDA and  has been authorized for detection and/or diagnosis of SARS-CoV-2 by FDA under an Emergency Use Authorization (EUA). This EUA will remain  in effect (meaning this test can be used) for the duration of the COVID-19 declaration under Se ction 564(b)(1) of the Act, 21 U.S.C. section 360bbb-3(b)(1), unless the authorization is terminated or revoked sooner.  Performed at [facility], [place] [address], [place],  [address]      Time coordinating discharge: 40 minutes  SIGNED:   Elmarie Shiley, MD  [facility]

## 2021-06-28 NOTE — Care Management Important Message (Signed)
Important Message  Patient Details  Name: Christine Christensen MRN: [id number] Date of Birth: 02-09-1946   Medicare Important Message Given:  Yes     Memory Argue 06/28/2021, 1:49 PM Call pt's room  s/w Son-Christine Christensen, IM given To Son -Christine Christensen

## 2021-06-28 NOTE — Progress Notes (Signed)
Nutrition Follow-up  DOCUMENTATION CODES:   Severe malnutrition in context of chronic illness  INTERVENTION:   - Ensure Enlive po BID, each supplement provides 350 kcal and 20 grams of protein  - Magic Cup TID with meals, each supplement provides 290 kcal and 9 grams of protein  - MVI with minerals daily  NUTRITION DIAGNOSIS:   Severe Malnutrition related to chronic illness (dementia and CVA) as evidenced by severe fat depletion, severe muscle depletion.  Ongoing, being addressed via diet liberalization and oral nutrition supplements  GOAL:   Patient will meet greater than or equal to 90% of their needs  Progressing  MONITOR:   PO intake, Supplement acceptance, Labs, Weight trends, Skin  REASON FOR ASSESSMENT:   Consult Hip fracture protocol  ASSESSMENT:   75 y.o. female with medical history of remote stroke in 2005 and 2008 with chronic R-sided weakness, vascular dementia, chronic ambulation dysfunction on roller walker and cane, Mnire's disease, and moderate mitral stenosis. She presented to the ED after a fall with associated R hip fx.  9/27 - s/p right hemiarthroplasty  Pt awaiting d/c to SNF per notes. Spoke with pt and family at bedside. Pt eating well. Family is bringing in outside food. Pt enjoys Ensure supplements.  Meal Completion: 10-100% x last 5 meals  Medications reviewed and include: Ensure Enlive BID, melatonin, miralax, senna, MVI with minerals daily  Labs reviewed.  Diet Order:   Diet Order             Diet - low sodium heart healthy           Diet - low sodium heart healthy           Diet regular Room service appropriate? Yes; Fluid consistency: Thin  Diet effective now                   EDUCATION NEEDS:   No education needs have been identified at this time  Skin:  Skin Assessment: Skin Integrity Issues: Stage II: left buttocks Incisions: right hip  Last BM:  06/28/21 large type 3  Height:   Ht Readings from Last 1  Encounters:  06/13/21 5\' 3"  (1.6 m)    Weight:   Wt Readings from Last 1 Encounters:  06/26/21 52.3 kg    Ideal Body Weight:  53.4 kg  BMI:  Body mass index is 20.42 kg/m.  Estimated Nutritional Needs:   Kcal:  1650-1850 kcal  Protein:  80-95 grams  Fluid:  >/= 1.7 L/day    Gustavus Bryant, MS, RD, LDN Inpatient Clinical Dietitian Please see AMiON for contact information.

## 2021-06-28 NOTE — Progress Notes (Signed)
Occupational Therapy Treatment Patient Details Name: Christine Christensen MRN: [id number] DOB: 1945/10/14 Today's Date: 06/28/2021   History of present illness Pt is a 75 y.o. female admitted 06/12/21 with fall at home; found to have R femoral neck fx. Sx delayed as pt deemed high risk; pre-op cardiology evaluation for near syncope, mitral stenosis. S/p R hip hemiarthroplasty on 9/27. Pt guarding R arm post-op; RUE imaging negative for acute injury. PMH includes vascular dementia, HTN, CVAs with residual R-side weakness (2005, 2008), Meniere's disease, bilateral TKAs.   OT comments  Pt progressing gradually towards OT goals (updated and extended accordingly). Pt able to progress mobility to/from bathroom using RW at [facility] over toilet to increase transfer ease). Pt remains limited in LB ADL independence due to R LE deficits, impaired balance and decreased ability to recall posterior hip precaution (able to recall 1/3 by end of session). Pt able to stand at sink for ADLs > 5 min, denies any increased pain with movement. Continue to recommend SNF rehab prior to DC home to improve overall independence and safety with daily tasks.    Recommendations for follow up therapy are one component of a multi-disciplinary discharge planning process, led by the attending physician.  Recommendations may be updated based on patient status, additional functional criteria and insurance authorization.    Follow Up Recommendations  SNF;Supervision/Assistance - 24 hour    Equipment Recommendations  3 in 1 bedside commode;Other (comment) (Rolling walker)    Recommendations for Other Services      Precautions / Restrictions Precautions Precautions: Fall;Posterior Hip Precaution Booklet Issued: Yes (comment) Required Braces or Orthoses: Other Brace Other Brace: abduction wedge Restrictions Weight Bearing Restrictions: Yes RLE Weight Bearing: Weight bearing as tolerated       Mobility Bed Mobility Overal bed  mobility: Needs Assistance Bed Mobility: Supine to Sit     Supine to sit: Supervision;HOB elevated     General bed mobility comments: cues for body mechanics and safety, use of bed rails    Transfers Overall transfer level: Needs assistance Equipment used: Rolling walker (2 wheeled) Transfers: Sit to/from Stand Sit to Stand: Min assist         General transfer comment: Min A to power up from bedside and toilet, increased time to achieve posture, cues for hand placement    Balance Overall balance assessment: Needs assistance Sitting-balance support: Bilateral upper extremity supported Sitting balance-Leahy Scale: Fair     Standing balance support: Bilateral upper extremity supported;During functional activity Standing balance-Leahy Scale: Poor Standing balance comment: benefits from BUE support, able to statically stand without support at sink                           ADL either performed or assessed with clinical judgement   ADL Overall ADL's : Needs assistance/impaired     Grooming: Min guard;Standing;Oral care;Wash/dry face Grooming Details (indicate cue type and reason): min guard for safety, able to stand without UE support to complete tasks. does benefit from stabilizing toothbrush while placing toothpaste on it due to suspected RA deformities, declined need for built up grip during task - brushed with L hand         Upper Body Dressing : Minimal assistance;Sitting Upper Body Dressing Details (indicate cue type and reason): to don gown around back, sequencing and problem solving cues needed Lower Body Dressing: Moderate assistance Lower Body Dressing Details (indicate cue type and reason): attempting to bend forward and cross  LEs to adjust socks, cues for body mechanics and when assist needed Toilet Transfer: Minimal assistance;Ambulation;Regular Toilet;BSC;RW Toilet Transfer Details (indicate cue type and reason): BSC over reg toilet to improve safety  with transfers, min a for negotiating RW and turning safely due to R knee buckling and R foot drop Toileting- Clothing Manipulation and Hygiene: Moderate assistance;Sit to/from stand Toileting - Clothing Manipulation Details (indicate cue type and reason): assist for clothing mgmt, able to assist with peri care after urination though hands on assist needed for balance     Functional mobility during ADLs: Minimal assistance;Rolling walker;Cueing for sequencing;Cueing for safety General ADL Comments: Progressing physically towards OT goals, limited by dementia and difficulty recalliing hip precautions     Vision   Vision Assessment?: No apparent visual deficits   Perception     Praxis      Cognition Arousal/Alertness: Awake/alert Behavior During Therapy: WFL for tasks assessed/performed Overall Cognitive Status: Impaired/Different from baseline Area of Impairment: Attention;Memory;Following commands;Safety/judgement;Awareness;Problem solving                   Current Attention Level: Selective Memory: Decreased short-term memory;Decreased recall of precautions Following Commands: Follows one step commands with increased time;Follows one step commands inconsistently Safety/Judgement: Decreased awareness of safety;Decreased awareness of deficits Awareness: Intellectual Problem Solving: Slow processing;Decreased initiation;Difficulty sequencing;Requires verbal cues General Comments: able to answer orientation questions though decreased awareness of timeline for previous knee replacement. Able to follow directions with multimodal cues. poor carryover of hip precautions despite reminders throughout session        Exercises     Shoulder Instructions       General Comments Son present and supportive    Pertinent Vitals/ Pain       Pain Assessment: Faces Faces Pain Scale: Hurts a little bit Pain Location: R LE at hip Pain Descriptors / Indicators: Grimacing Pain  Intervention(s): Monitored during session;Limited activity within patient's tolerance;Repositioned  Home Living                                          Prior Functioning/Environment              Frequency  Min 2X/week        Progress Toward Goals  OT Goals(current goals can now be found in the care plan section)  Progress towards OT goals: Progressing toward goals  Acute Rehab OT Goals Patient Stated Goal: get stronger OT Goal Formulation: With patient Time For Goal Achievement: 07/12/21 Potential to Achieve Goals: Good ADL Goals Pt Will Perform Lower Body Bathing: with mod assist;sit to/from stand;with adaptive equipment Pt Will Perform Lower Body Dressing: with mod assist;with adaptive equipment;sitting/lateral leans;sit to/from stand Pt Will Transfer to Toilet: with mod assist;stand pivot transfer;bedside commode Pt Will Perform Toileting - Clothing Manipulation and hygiene: with mod assist;sitting/lateral leans;sit to/from stand Additional ADL Goal #1: Pt to verbalize 3/3 posterior hip precautions with min verbal cues during ADLs  Plan Discharge plan remains appropriate    Co-evaluation                 AM-PAC OT "6 Clicks" Daily Activity     Outcome Measure   Help from another person eating meals?: A Little Help from another person taking care of personal grooming?: A Little Help from another person toileting, which includes using toliet, bedpan, or urinal?: A Lot Help from another person bathing (including  washing, rinsing, drying)?: A Lot Help from another person to put on and taking off regular upper body clothing?: A Little Help from another person to put on and taking off regular lower body clothing?: A Lot 6 Click Score: 15    End of Session Equipment Utilized During Treatment: Rolling walker;Gait belt  OT Visit Diagnosis: Unsteadiness on feet (R26.81);Other abnormalities of gait and mobility (R26.89);Muscle weakness  (generalized) (M62.81);History of falling (Z91.81);Pain Pain - Right/Left: Right Pain - part of body: Hip;Leg   Activity Tolerance Patient tolerated treatment well   Patient Left in chair;with call bell/phone within reach;with chair alarm set;with family/visitor present   Nurse Communication Mobility status        Time: 1339-1420 OT Time Calculation (min): 41 min  Charges: OT General Charges $OT Visit: 1 Visit OT Treatments $Self Care/Home Management : 23-37 mins $Therapeutic Activity: 8-22 mins  Malachy Chamber, OTR/L Acute Rehab Services Office: [phone]   Layla Maw 06/28/2021, 2:38 PM

## 2021-06-29 NOTE — Plan of Care (Signed)
  Problem: Health Behavior/Discharge Planning: Goal: Ability to manage health-related needs will improve Outcome: Adequate for Discharge   Problem: Activity: Goal: Risk for activity intolerance will decrease Outcome: Adequate for Discharge   Problem: Nutrition: Goal: Adequate nutrition will be maintained Outcome: Adequate for Discharge   Problem: Coping: Goal: Level of anxiety will decrease Outcome: Adequate for Discharge   Problem: Elimination: Goal: Will not experience complications related to bowel motility Outcome: Adequate for Discharge Goal: Will not experience complications related to urinary retention Outcome: Adequate for Discharge   Problem: Pain Managment: Goal: General experience of comfort will improve Outcome: Adequate for Discharge   Problem: Safety: Goal: Ability to remain free from injury will improve Outcome: Adequate for Discharge   Problem: Skin Integrity: Goal: Risk for impaired skin integrity will decrease Outcome: Adequate for Discharge   Problem: Malnutrition  (NI-5.2) Goal: Food and/or nutrient delivery Description: Individualized approach for food/nutrient provision. Outcome: Adequate for Discharge   Problem: Acute Rehab PT Goals(only PT should resolve) Goal: Pt Will Go Supine/Side To Sit Outcome: Adequate for Discharge Goal: Patient Will Transfer Sit To/From Stand Outcome: Adequate for Discharge Goal: Pt Will Ambulate Outcome: Adequate for Discharge   Problem: Acute Rehab OT Goals (only OT should resolve) Goal: Pt. Will Perform Lower Body Bathing Outcome: Adequate for Discharge Goal: Pt. Will Perform Lower Body Dressing Outcome: Adequate for Discharge Goal: Pt. Will Transfer To Toilet Outcome: Adequate for Discharge Goal: Pt. Will Perform Toileting-Clothing Manipulation Outcome: Adequate for Discharge Goal: OT Additional ADL Goal #1 Outcome: Adequate for Discharge   Problem: Safety: Goal: Non-violent Restraint(s) Outcome:  Adequate for Discharge

## 2021-06-29 NOTE — TOC Transition Note (Signed)
Transition of Care [facility]) - CM/SW Discharge Note   Patient Details  Name: Christine Christensen MRN: [id number] Date of Birth: 10-17-1945  [facility] CM/SW Contact:  Tresa Endo Phone Number: 06/29/2021, 2:45 PM   Clinical Narrative:    Patient will DC to: [facility] Anticipated DC date: 06/29/2021 Family notified: Pt Son Transport by: Corey Harold   Per MD patient ready for DC to [place] room 109. RN to call report prior to discharge (). RN, patient, patient's family, and facility notified of DC. Discharge Summary and FL2 sent to facility. DC packet on chart. Ambulance transport requested for patient.   CSW will sign off for now as social work intervention is no longer needed. Please consult [place] again if new needs arise.           Patient Goals and CMS Choice        Discharge Placement                       Discharge Plan and Services                                     Social Determinants of Health (SDOH) Interventions     Readmission Risk Interventions No flowsheet data found.

## 2021-06-29 NOTE — Discharge Summary (Signed)
Physician Discharge Summary  Christine Christensen BMW:[id number] DOB: 07-20-1946 DOA: 06/12/2021  PCP: Nolene Ebbs, MD  Admit date: 06/12/2021 Discharge date: 06/29/2021  Admitted From: Home  Disposition:  SNF  Recommendations for Outpatient Follow-up:  Follow up with PCP in 1-2 weeks Please obtain BMP/CBC in one week Needs to follow up with Ortho post sx 2 weeks.  Delirium Precaution.  WBAT with posterior hip precautions x6 weeks, hip abduction pillow to be used when in bed. No Knee Immobilizer. Lovenox for DVT prophylaxis 4 weeks.    Discharge Condition: Stable.  CODE STATUS: Full Code Diet recommendation: Heart Healthy    Brief/Interim Summary: 75 year old with past medical history significant for CVA with right-sided weakness, hypertension, vascular dementia, Mnire's disease, moderate mitral stenosis who presents after sudden fall in her kitchen and found to have a right femoral neck fracture.  Patient underwent right hip hemiarthroplasty on 9/27 after undergoing cardiac evaluation and taking risk of surgery.  Patient hospital course complicated by confusion and hypoactive delirium and was a slowly to improve.  Patient became more confused and agitated overnight 10/3, she required a dose of Ativan.  She was evaluated by psych who recommended delirium precaution and to hold Remeron. She Has improved, she is more calm and alert and oriented.  She has been medically stable for discharge awaiting placement/insurance authorization to be placed at SNF.  This has been received today so patient is going to be discharged in stable condition.    1-Close traumatic right hip fracture: Right hip hemiarthroplasty 9/27 -Weightbearing as tolerated. -Plan for skilled nursing facility. Tylenol as needed for pain -Need Lovenox for DVT prophylaxis for 4 week   2-Severe calcific mitral valve stenosis with severe pulmonary hypertension and mild aortic valve stenosis: Evaluated by cardiology who  recommended conservative management. Patient was a started on low-dose beta-blocker. Stable.   3-History of stroke, Right Hemiparesis  -On aspirin.  -CT head without acute finding.   Essential HTN; On metoprolol.    Acute Metabolic Encephalopathy: in setting of vascular dementia. Delirium post op, medication, anesthesia.  Delirium precaution.  Patient initially had hypoactive delirium, opioid and Remeron discontinued.  Subsequently she had an episode of agitation.  Received a dose of Ativan. -Patient B12 normal. -UA negative.  Repeated Ct head, stable.  -Evaluated by psych Remeron held, Seroquel PRN for agitation.  -sundowning. Respond to low dose Seroquel.    See below wound care note Pressure Injury 06/16/21 Buttocks Left Stage 2 -  Partial thickness loss of dermis presenting as a shallow open injury with a red, pink wound bed without slough. approx 40mm (Active)  06/16/21 0000  Location: Buttocks  Location Orientation: Left  Staging: Stage 2 -  Partial thickness loss of dermis presenting as a shallow open injury with a red, pink wound bed without slough.  Wound Description (Comments): approx 3mm  Present on Admission: No    Severe Protein Malnutrition. On supplement.    Nutrition Problem: Severe Malnutrition Etiology: chronic illness (dementia and CVA)       Signs/Symptoms: severe fat depletion, severe muscle depletion       Interventions: Ensure Enlive (each supplement provides 350kcal and 20 grams of protein), Liberalize Diet   Estimated body mass index is 20.7 kg/m as calculated from the following:   Height as of this encounter: 5\' 3"  (1.6 m).   Weight as of this encounter: 53 kg.     Discharge Diagnoses:  Active Problems:   Closed right hip fracture, initial encounter ([place])  Hip fracture (HCC)   Protein-calorie malnutrition, severe   Pressure injury of skin   Delirium due to another medical condition   Dementia with behavioral  disturbance    Discharge Instructions  Discharge Instructions     Diet - low sodium heart healthy   Complete by: As directed    Diet - low sodium heart healthy   Complete by: As directed    Discharge wound care:   Complete by: As directed    See above   Discharge wound care:   Complete by: As directed    See above   Increase activity slowly   Complete by: As directed    Increase activity slowly   Complete by: As directed       Allergies as of 06/29/2021   No Known Allergies      Medication List     STOP taking these medications    aspirin 325 MG EC tablet Replaced by: aspirin 325 MG tablet   mirtazapine 7.5 MG tablet Commonly known as: REMERON       TAKE these medications    acetaminophen 500 MG tablet Commonly known as: TYLENOL Take 2 tablets (1,000 mg total) by mouth every 6 (six) hours as needed for mild pain, moderate pain, fever or headache.   aspirin 325 MG tablet Take 1 tablet (325 mg total) by mouth daily. Replaces: aspirin 325 MG EC tablet   enoxaparin 40 MG/0.4ML injection Commonly known as: LOVENOX Inject 0.4 mLs (40 mg total) into the skin daily.   escitalopram 20 MG tablet Commonly known as: LEXAPRO Take 20 mg daily by mouth.   feeding supplement Liqd Take 237 mLs by mouth 2 (two) times daily between meals.   fexofenadine 180 MG tablet Commonly known as: ALLEGRA Take 180 mg by mouth daily as needed for allergies or rhinitis.   melatonin 3 MG Tabs tablet Take 1 tablet (3 mg total) by mouth at bedtime.   metoprolol tartrate 25 MG tablet Commonly known as: LOPRESSOR Take 0.5 tablets (12.5 mg total) by mouth 2 (two) times daily.   multivitamin with minerals Tabs tablet Take 1 tablet by mouth daily.   naproxen sodium 220 MG tablet Commonly known as: ALEVE Take 220 mg by mouth daily as needed (back pain).   polyethylene glycol 17 g packet Commonly known as: MIRALAX / GLYCOLAX Take 17 g by mouth 2 (two) times daily.    QUEtiapine 25 MG tablet Commonly known as: SEROQUEL Take 0.5 tablets (12.5 mg total) by mouth daily as needed (agitation unable to redirect).   senna-docusate 8.6-50 MG tablet Commonly known as: Senokot-S Take 1 tablet by mouth 2 (two) times daily.               Discharge Care Instructions  (From admission, onward)           Start     Ordered   06/27/21 0000  Discharge wound care:       Comments: See above   06/27/21 1157   06/25/21 0000  Discharge wound care:       Comments: See above   06/25/21 0859            No Known Allergies  Consultations: Raliegh Ip, Ortho Psych    Procedures/Studies: DG Chest 1 View  Result Date: 06/12/2021 CLINICAL DATA:  Fall with right-sided hip pain EXAM: CHEST  1 VIEW COMPARISON:  03/24/2020 FINDINGS: No focal opacity or pleural effusion. Stable cardiomediastinal silhouette with aortic atherosclerosis. No pneumothorax. IMPRESSION: No  active disease. Electronically Signed   By: Donavan Foil M.D.   On: 06/12/2021 17:00   DG ELBOW COMPLETE RIGHT (3+VIEW)  Result Date: 06/15/2021 CLINICAL DATA:  Right elbow fracture EXAM: RIGHT ELBOW - COMPLETE 3+ VIEW COMPARISON:  None. FINDINGS: No displaced coronoid process fracture of the proximal right radius. No joint effusion or fat pad displacement. Alignment is normal. IMPRESSION: No displaced coronoid process fracture visible. Electronically Signed   By: Ulyses Jarred M.D.   On: 06/15/2021 02:33   DG Forearm Right  Result Date: 06/14/2021 CLINICAL DATA:  Fall EXAM: RIGHT FOREARM - 2 VIEW COMPARISON:  None. FINDINGS: Possible fracture deformity at the coronoid process of the ulna at the elbow. No malalignment. IMPRESSION: Findings are indeterminate for coronoid process fracture at the elbow, recommend dedicated views of the right elbow. These results will be called to the ordering clinician or representative by the Radiologist Assistant, and communication documented in the PACS or [employer]. Electronically Signed   By: Donavan Foil M.D.   On: 06/14/2021 20:09   CT HEAD WO CONTRAST (5MM)  Result Date: 06/25/2021 CLINICAL DATA:  Delirium EXAM: CT HEAD WITHOUT CONTRAST TECHNIQUE: Contiguous axial images were obtained from the base of the skull through the vertex without intravenous contrast. COMPARISON:  06/12/2021 FINDINGS: Brain: There is no mass, hemorrhage or extra-axial collection. The size and configuration of the ventricles and extra-axial CSF spaces are normal. There is hypoattenuation of the white matter, most commonly indicating chronic small vessel disease. There are old infarcts of the right cerebellum and left pons, unchanged. Vascular: Atherosclerotic calcification of the internal carotid arteries at the skull base. No abnormal hyperdensity of the major intracranial arteries or dural venous sinuses. Skull: The visualized skull base, calvarium and extracranial soft tissues are normal. Sinuses/Orbits: No fluid levels or advanced mucosal thickening of the visualized paranasal sinuses. No mastoid or middle ear effusion. The orbits are normal. IMPRESSION: 1. No acute intracranial abnormality. 2. Old right cerebellar and left pontine infarcts and findings of chronic small vessel disease. Electronically Signed   By: Ulyses Jarred M.D.   On: 06/25/2021 23:17   CT Head Wo Contrast  Result Date: 06/12/2021 CLINICAL DATA:  Head injury after fall. EXAM: CT HEAD WITHOUT CONTRAST TECHNIQUE: Contiguous axial images were obtained from the base of the skull through the vertex without intravenous contrast. COMPARISON:  March 24, 2020. FINDINGS: Brain: Mild chronic ischemic white matter disease is noted. No mass effect or midline shift is noted. Ventricular size is within normal limits. There is no evidence of mass lesion, hemorrhage or acute infarction. Vascular: No hyperdense vessel or unexpected calcification. Skull: Normal. Negative for fracture or focal lesion. Sinuses/Orbits: No acute  finding. Other: None. IMPRESSION: No acute intracranial abnormality seen. Electronically Signed   By: Marijo Conception M.D.   On: 06/12/2021 16:02   DG Pelvis Portable  Result Date: 06/17/2021 CLINICAL DATA:  Postop EXAM: PORTABLE PELVIS 1-2 VIEWS COMPARISON:  06/14/2021 FINDINGS: Right hip hemiarthroplasty unchanged from the prior study. Normal alignment of the prosthesis. No fracture. The patient rotated to the right. Remainder of the pelvis negative. Pelvic calcification consistent with uterine fibroid. IMPRESSION: Right hip hemiarthroplasty.  No acute fracture in the pelvis. Electronically Signed   By: Franchot Gallo M.D.   On: 06/17/2021 12:49   DG Pelvis Portable  Result Date: 06/14/2021 CLINICAL DATA:  Right hip hemiarthroplasty EXAM: PORTABLE PELVIS 1-2 VIEWS COMPARISON:  06/12/2021 FINDINGS: Single frontal view of the pelvis was obtained, limited by  portable technique. Right hip hemiarthroplasty is identified in the expected position without signs of acute complication. Remainder of the bony pelvis is unremarkable. IMPRESSION: 1. Unremarkable right hip hemiarthroplasty. Electronically Signed   By: Randa Ngo M.D.   On: 06/14/2021 16:50   DG Ankle Right Port  Result Date: 06/13/2021 CLINICAL DATA:  Pain EXAM: PORTABLE RIGHT ANKLE - 2 VIEW COMPARISON:  None. FINDINGS: There is no evidence of fracture, dislocation, or joint effusion. There is no evidence of arthropathy or other focal bone abnormality. Soft tissue edema about the medial foot. IMPRESSION: No fracture or dislocation of the right ankle. Soft tissue edema about the medial foot. Electronically Signed   By: Eddie Candle M.D.   On: 06/13/2021 13:21   DG Humerus Right  Result Date: 06/14/2021 CLINICAL DATA:  Fall with elbow pain EXAM: RIGHT HUMERUS - 2+ VIEW COMPARISON:  None. FINDINGS: There is no evidence of fracture or other focal bone lesions. Soft tissues are unremarkable. IMPRESSION: Negative. Electronically Signed   By: Donavan Foil M.D.   On: 06/14/2021 20:09   ECHOCARDIOGRAM COMPLETE  Result Date: 06/13/2021    ECHOCARDIOGRAM REPORT   Patient Name:   Christine Christensen [facility] Date of Exam: 06/13/2021 Medical Rec #:  [id number]        Height:       63.5 in Accession #:    [id number]       Weight:       117.0 lb Date of Birth:  11/19/1945         BSA:          1.549 m Patient Age:    75 years         BP:           142/76 mmHg Patient Gender: F                HR:           71 bpm. Exam Location:  Inpatient Procedure: 2D Echo, 3D Echo, Cardiac Doppler and Color Doppler                                 MODIFIED REPORT:   This report was modified by Lyman Bishop MD on 06/13/2021 due to Noted LVEDP      elevated - however, difficult to quantify due to heavy mitral annular              calcification - suspect LVEDP value was overestimated.  Indications:     R55 Syncope  History:         Patient has prior history of Echocardiogram examinations, most                  recent 07/27/2017. Stroke, Aortic Valve Disease and Mitral Valve                  Disease, Signs/Symptoms:Murmur and Syncope; Risk                  Factors:Hypertension. Mitral and aortic stenosis.  Sonographer:     Roseanna Rainbow RDCS Referring Phys:  [id number] Lequita Halt Diagnosing Phys: Lyman Bishop MD IMPRESSIONS  1. Left ventricular ejection fraction, by estimation, is >75%. Left ventricular ejection fraction by 2D MOD biplane is 75.8 %. The left ventricle has hyperdynamic function. The left ventricle has no regional wall motion abnormalities. There is mild left  ventricular hypertrophy. Left ventricular diastolic  parameters are consistent with Grade II diastolic dysfunction (pseudonormalization). Elevated left ventricular end-diastolic pressure.  2. Right ventricular systolic function is hyperdynamic. The right ventricular size is normal. There is severely elevated pulmonary artery systolic pressure. The estimated right ventricular systolic pressure is 30.1 mmHg.  3. The mitral valve is  abnormal. Trivial mitral valve regurgitation. Severe mitral stenosis. The mean mitral valve gradient is 18.0 mmHg with average heart rate of 79 bpm. Severe mitral annular calcification.  4. There is heavy calcification of the aortic root uncluding a calcificed mass below the aortic valve annulus in the aorta/mitral curtain. The aortic valve is tricuspid. There is mild calcification of the aortic valve. Aortic valve regurgitation is not visualized. Mild aortic valve stenosis. Aortic valve mean gradient measures 10.4 mmHg. AVA around 1.56 cm2, DI 0.50  5. The inferior vena cava is normal in size with <50% respiratory variability, suggesting right atrial pressure of 8 mmHg. Comparison(s): Changes from prior study are noted. 07/27/2017: LVEF 60-65%, mild LVH, grade 1 DD with elevated LVEDP, Moderate MS - mean gradient 10 mmHg, mild AS - mean gradient 10 mmHg. Conclusion(s)/Recommendation(s): There is severe mitral stenosis with severe pulmonary hypertension and high LV filling pressure - the aortic valve appears unchanged, however, there is heavy aortic root calcification - consider CT surgery consultation. FINDINGS  Left Ventricle: Left ventricular ejection fraction, by estimation, is >75%. Left ventricular ejection fraction by 2D MOD biplane is 75.8 %. The left ventricle has hyperdynamic function. The left ventricle has no regional wall motion abnormalities. 3D left ventricular ejection fraction analysis performed but not reported based on interpreter judgement due to suboptimal quality. The left ventricular internal cavity size was normal in size. There is mild left ventricular hypertrophy. Left ventricular diastolic parameters are consistent with Grade II diastolic dysfunction (pseudonormalization). Elevated left ventricular end-diastolic pressure. Right Ventricle: The right ventricular size is normal. No increase in right ventricular wall thickness. Right ventricular systolic function is hyperdynamic. There is  severely elevated pulmonary artery systolic pressure. The tricuspid regurgitant velocity  is 3.96 m/s, and with an assumed right atrial pressure of 8 mmHg, the estimated right ventricular systolic pressure is 60.1 mmHg. Left Atrium: Left atrial size was normal in size. Right Atrium: Right atrial size was normal in size. Pericardium: There is no evidence of pericardial effusion. Mitral Valve: The mitral valve is abnormal. Severe mitral annular calcification. Trivial mitral valve regurgitation. Severe mitral valve stenosis. MV peak gradient, 31.9 mmHg. The mean mitral valve gradient is 18.0 mmHg with average heart rate of 79 bpm. Tricuspid Valve: The tricuspid valve is grossly normal. Tricuspid valve regurgitation is mild. Aortic Valve: There is heavy calcification of the aortic root uncluding a calcificed mass below the aortic valve annulus in the aorta/mitral curtain. The aortic valve is tricuspid. There is mild calcification of the aortic valve. There is moderate to severe aortic valve annular calcification. Aortic valve regurgitation is not visualized. Mild aortic stenosis is present. Aortic valve mean gradient measures 10.4 mmHg. Aortic valve peak gradient measures 20.2 mmHg. Aortic valve area, by VTI measures 1.28 cm. Pulmonic Valve: The pulmonic valve was grossly normal. Pulmonic valve regurgitation is trivial. Aorta: The aortic root and ascending aorta are structurally normal, with no evidence of dilitation. Venous: The inferior vena cava is normal in size with less than 50% respiratory variability, suggesting right atrial pressure of 8 mmHg. IAS/Shunts: There is right bowing of the interatrial septum, suggestive of elevated left atrial pressure. No atrial level shunt detected by color flow Doppler.  LEFT VENTRICLE PLAX 2D                        Biplane EF (MOD) LVIDd:         3.50 cm         LV Biplane EF:   Left LVIDs:         2.30 cm                          ventricular LV PW:         0.90 cm                           ejection LV IVS:        1.30 cm                          fraction by LVOT diam:     1.80 cm                          2D MOD LV SV:         56                               biplane is LV SV Index:   36                               75.8 %. LVOT Area:     2.54 cm                                Diastology                                LV e' medial:    3.48 cm/s LV Volumes (MOD)               LV E/e' medial:  72.7 LV vol d, MOD    24.5 ml       LV e' lateral:   4.65 cm/s A2C:                           LV E/e' lateral: 54.4 LV vol d, MOD    45.9 ml A4C: LV vol s, MOD    4.9 ml A2C: LV vol s, MOD    12.5 ml       3D Volume EF: A4C:                           3D EF:        66 % LV SV MOD A2C:   19.6 ml       LV EDV:       73 ml LV SV MOD A4C:   45.9 ml       LV ESV:       25 ml LV SV MOD BP:    25.6 ml       LV SV:        48 ml RIGHT VENTRICLE  IVC RV S prime:     13.70 cm/s  IVC diam: 1.40 cm TAPSE (M-mode): 2.1 cm LEFT ATRIUM             Index       RIGHT ATRIUM           Index LA diam:        4.40 cm 2.84 cm/m  RA Area:     11.70 cm LA Vol (A2C):   40.5 ml 26.15 ml/m RA Volume:   24.80 ml  16.01 ml/m LA Vol (A4C):   48.1 ml 31.06 ml/m LA Biplane Vol: 45.0 ml 29.06 ml/m  AORTIC VALVE AV Area (Vmax):    1.56 cm AV Area (Vmean):   1.51 cm AV Area (VTI):     1.28 cm AV Vmax:           224.80 cm/s AV Vmean:          144.000 cm/s AV VTI:            0.434 m AV Peak Grad:      20.2 mmHg AV Mean Grad:      10.4 mmHg LVOT Vmax:         138.00 cm/s LVOT Vmean:        85.400 cm/s LVOT VTI:          0.219 m LVOT/AV VTI ratio: 0.50  AORTA Ao Root diam: 2.70 cm Ao Asc diam:  2.80 cm MITRAL VALVE                TRICUSPID VALVE MV Area (PHT): 2.73 cm     TR Peak grad:   62.7 mmHg MV Area VTI:   0.57 cm     TR Vmax:        396.00 cm/s MV Peak grad:  31.9 mmHg MV Mean grad:  18.0 mmHg    SHUNTS MV Vmax:       2.82 m/s     Systemic VTI:  0.22 m MV Vmean:      197.0 cm/s   Systemic Diam: 1.80 cm MV Decel  Time: 278 msec MV E velocity: 253.00 cm/s MV A velocity: 238.00 cm/s MV E/A ratio:  1.06 Lyman Bishop MD Electronically signed by Lyman Bishop MD Signature Date/Time: 06/13/2021/10:35:48 AM    Final (Updated)    DG HIP PORT UNILAT WITH PELVIS 1V RIGHT  Result Date: 06/20/2021 CLINICAL DATA:  Status post fall. EXAM: DG HIP (WITH OR WITHOUT PELVIS) 1V PORT RIGHT COMPARISON:  None. FINDINGS: A total right hip replacement is seen without evidence of surrounding lucency to suggest the presence of hardware loosening or infection. There is no evidence of acute fracture or dislocation. Small calcified uterine fibroids are suspected. IMPRESSION: Total right hip replacement without evidence of hardware loosening or infection. Electronically Signed   By: Virgina Norfolk M.D.   On: 06/20/2021 19:29   DG Hip Unilat W or Wo Pelvis 2-3 Views Right  Result Date: 06/12/2021 CLINICAL DATA:  Right hip pain after fall. EXAM: DG HIP (WITH OR WITHOUT PELVIS) 2-3V RIGHT COMPARISON:  None. FINDINGS: Moderately displaced proximal right femoral neck fracture is noted. Left hip is unremarkable. IMPRESSION: Moderately displaced proximal right femoral neck fracture. Electronically Signed   By: Marijo Conception M.D.   On: 06/12/2021 17:00     Subjective: Seen and examined.  She has no complaints.  Alert and oriented but she states that she is just tired.  Discharge Exam: Vitals:   06/29/21 8676  06/29/21 0748  BP: 119/67 132/67  Pulse: 62 68  Resp: 20   Temp: 98.2 F (36.8 C) 98.4 F (36.9 C)  SpO2: 95% 98%     General exam: Appears calm and comfortable  Respiratory system: Clear to auscultation. Respiratory effort normal. Cardiovascular system: S1 & S2 heard, RRR. No JVD, murmurs, rubs, gallops or clicks. No pedal edema. Gastrointestinal system: Abdomen is nondistended, soft and nontender. No organomegaly or masses felt. Normal bowel sounds heard. Central nervous system: Alert and oriented. No focal neurological  deficits.   The results of significant diagnostics from this hospitalization (including imaging, microbiology, ancillary and laboratory) are listed below for reference.     Microbiology: Recent Results (from the past 240 hour(s))  SARS CORONAVIRUS 2 (TAT 6-24 HRS) Nasopharyngeal Nasopharyngeal Swab     Status: None   Collection Time: 06/21/21  5:40 PM   Specimen: Nasopharyngeal Swab  Result Value Ref Range Status   SARS Coronavirus 2 NEGATIVE NEGATIVE Final    Comment: (NOTE) SARS-CoV-2 target nucleic acids are NOT DETECTED.  The SARS-CoV-2 RNA is generally detectable in upper and lower respiratory specimens during the acute phase of infection. Negative results do not preclude SARS-CoV-2 infection, do not rule out co-infections with other pathogens, and should not be used as the sole basis for treatment or other patient management decisions. Negative results must be combined with clinical observations, patient history, and epidemiological information. The expected result is Negative.  Fact Sheet for Patients: SugarRoll.be  Fact Sheet for Healthcare Providers: https://www.woods-mathews.com/  This test is not yet approved or cleared by the [place] FDA and  has been authorized for detection and/or diagnosis of SARS-CoV-2 by FDA under an Emergency Use Authorization (EUA). This EUA will remain  in effect (meaning this test can be used) for the duration of the COVID-19 declaration under Se ction 564(b)(1) of the Act, 21 U.S.C. section 360bbb-3(b)(1), unless the authorization is terminated or revoked sooner.  Performed at [facility], [place] [address], [place], [place] [address]   Resp Panel by RT-PCR (Flu A&B, Covid) Nasopharyngeal Swab     Status: None   Collection Time: 06/27/21 10:34 AM   Specimen: Nasopharyngeal Swab; Nasopharyngeal(NP) swabs in vial transport medium  Result Value Ref Range Status   SARS Coronavirus 2 by  RT PCR NEGATIVE NEGATIVE Final    Comment: (NOTE) SARS-CoV-2 target nucleic acids are NOT DETECTED.  The SARS-CoV-2 RNA is generally detectable in upper respiratory specimens during the acute phase of infection. The lowest concentration of SARS-CoV-2 viral copies this assay can detect is 138 copies/mL. A negative result does not preclude SARS-Cov-2 infection and should not be used as the sole basis for treatment or other patient management decisions. A negative result may occur with  improper specimen collection/handling, submission of specimen other than nasopharyngeal swab, presence of viral mutation(s) within the areas targeted by this assay, and inadequate number of viral copies(<138 copies/mL). A negative result must be combined with clinical observations, patient history, and epidemiological information. The expected result is Negative.  Fact Sheet for Patients:  EntrepreneurPulse.com.au  Fact Sheet for Healthcare Providers:  IncredibleEmployment.be  This test is no t yet approved or cleared by the [place] FDA and  has been authorized for detection and/or diagnosis of SARS-CoV-2 by FDA under an Emergency Use Authorization (EUA). This EUA will remain  in effect (meaning this test can be used) for the duration of the COVID-19 declaration under Section 564(b)(1) of the Act, 21 U.S.C.section 360bbb-3(b)(1), unless  the authorization is terminated  or revoked sooner.       Influenza A by PCR NEGATIVE NEGATIVE Final   Influenza B by PCR NEGATIVE NEGATIVE Final    Comment: (NOTE) The Xpert Xpress SARS-CoV-2/FLU/RSV plus assay is intended as an aid in the diagnosis of influenza from Nasopharyngeal swab specimens and should not be used as a sole basis for treatment. Nasal washings and aspirates are unacceptable for Xpert Xpress SARS-CoV-2/FLU/RSV testing.  Fact Sheet for Patients: EntrepreneurPulse.com.au  Fact Sheet  for Healthcare Providers: IncredibleEmployment.be  This test is not yet approved or cleared by the [place] FDA and has been authorized for detection and/or diagnosis of SARS-CoV-2 by FDA under an Emergency Use Authorization (EUA). This EUA will remain in effect (meaning this test can be used) for the duration of the COVID-19 declaration under Section 564(b)(1) of the Act, 21 U.S.C. section 360bbb-3(b)(1), unless the authorization is terminated or revoked.  Performed at [facility], [place] [address] [place], [place] [address]      Labs: BNP (last 3 results) No results for input(s): BNP in the last 8760 hours. Basic Metabolic Panel: Recent Labs  Lab 06/24/21 0929 06/26/21 1422  NA 135 139  K 4.2 4.3  CL 101 103  CO2 25 25  GLUCOSE 111* 112*  BUN 18 21  CREATININE 0.64 0.62  CALCIUM 9.3 9.5   Liver Function Tests: No results for input(s): AST, ALT, ALKPHOS, BILITOT, PROT, ALBUMIN in the last 168 hours.  No results for input(s): LIPASE, AMYLASE in the last 168 hours. No results for input(s): AMMONIA in the last 168 hours. CBC: Recent Labs  Lab 06/26/21 1422  WBC 9.5  HGB 11.2*  HCT 33.9*  MCV 87.8  PLT 526*   Cardiac Enzymes: No results for input(s): CKTOTAL, CKMB, CKMBINDEX, TROPONINI in the last 168 hours. BNP: Invalid input(s): POCBNP CBG: No results for input(s): GLUCAP in the last 168 hours. D-Dimer No results for input(s): DDIMER in the last 72 hours. Hgb A1c No results for input(s): HGBA1C in the last 72 hours. Lipid Profile No results for input(s): CHOL, HDL, LDLCALC, TRIG, CHOLHDL, LDLDIRECT in the last 72 hours. Thyroid function studies No results for input(s): TSH, T4TOTAL, T3FREE, THYROIDAB in the last 72 hours.  Invalid input(s): FREET3 Anemia work up No results for input(s): VITAMINB12, FOLATE, FERRITIN, TIBC, IRON, RETICCTPCT in the last 72 hours. Urinalysis    Component Value Date/Time   COLORURINE YELLOW  06/21/2021 1100   APPEARANCEUR CLEAR 06/21/2021 1100   LABSPEC 1.017 06/21/2021 1100   PHURINE 6.0 06/21/2021 1100   GLUCOSEU NEGATIVE 06/21/2021 1100   HGBUR NEGATIVE 06/21/2021 1100   BILIRUBINUR NEGATIVE 06/21/2021 1100   KETONESUR NEGATIVE 06/21/2021 1100   PROTEINUR NEGATIVE 06/21/2021 1100   NITRITE NEGATIVE 06/21/2021 1100   LEUKOCYTESUR NEGATIVE 06/21/2021 1100   Sepsis Labs Invalid input(s): PROCALCITONIN,  WBC,  LACTICIDVEN Microbiology Recent Results (from the past 240 hour(s))  SARS CORONAVIRUS 2 (TAT 6-24 HRS) Nasopharyngeal Nasopharyngeal Swab     Status: None   Collection Time: 06/21/21  5:40 PM   Specimen: Nasopharyngeal Swab  Result Value Ref Range Status   SARS Coronavirus 2 NEGATIVE NEGATIVE Final    Comment: (NOTE) SARS-CoV-2 target nucleic acids are NOT DETECTED.  The SARS-CoV-2 RNA is generally detectable in upper and lower respiratory specimens during the acute phase of infection. Negative results do not preclude SARS-CoV-2 infection, do not rule out co-infections with other pathogens, and should not be used as the sole  basis for treatment or other patient management decisions. Negative results must be combined with clinical observations, patient history, and epidemiological information. The expected result is Negative.  Fact Sheet for Patients: SugarRoll.be  Fact Sheet for Healthcare Providers: https://www.woods-mathews.com/  This test is not yet approved or cleared by the [place] FDA and  has been authorized for detection and/or diagnosis of SARS-CoV-2 by FDA under an Emergency Use Authorization (EUA). This EUA will remain  in effect (meaning this test can be used) for the duration of the COVID-19 declaration under Se ction 564(b)(1) of the Act, 21 U.S.C. section 360bbb-3(b)(1), unless the authorization is terminated or revoked sooner.  Performed at [facility], [place] [address],  [place], [place] [address]   Resp Panel by RT-PCR (Flu A&B, Covid) Nasopharyngeal Swab     Status: None   Collection Time: 06/27/21 10:34 AM   Specimen: Nasopharyngeal Swab; Nasopharyngeal(NP) swabs in vial transport medium  Result Value Ref Range Status   SARS Coronavirus 2 by RT PCR NEGATIVE NEGATIVE Final    Comment: (NOTE) SARS-CoV-2 target nucleic acids are NOT DETECTED.  The SARS-CoV-2 RNA is generally detectable in upper respiratory specimens during the acute phase of infection. The lowest concentration of SARS-CoV-2 viral copies this assay can detect is 138 copies/mL. A negative result does not preclude SARS-Cov-2 infection and should not be used as the sole basis for treatment or other patient management decisions. A negative result may occur with  improper specimen collection/handling, submission of specimen other than nasopharyngeal swab, presence of viral mutation(s) within the areas targeted by this assay, and inadequate number of viral copies(<138 copies/mL). A negative result must be combined with clinical observations, patient history, and epidemiological information. The expected result is Negative.  Fact Sheet for Patients:  EntrepreneurPulse.com.au  Fact Sheet for Healthcare Providers:  IncredibleEmployment.be  This test is no t yet approved or cleared by the [place] FDA and  has been authorized for detection and/or diagnosis of SARS-CoV-2 by FDA under an Emergency Use Authorization (EUA). This EUA will remain  in effect (meaning this test can be used) for the duration of the COVID-19 declaration under Section 564(b)(1) of the Act, 21 U.S.C.section 360bbb-3(b)(1), unless the authorization is terminated  or revoked sooner.       Influenza A by PCR NEGATIVE NEGATIVE Final   Influenza B by PCR NEGATIVE NEGATIVE Final    Comment: (NOTE) The Xpert Xpress SARS-CoV-2/FLU/RSV plus assay is intended as an aid in the diagnosis of  influenza from Nasopharyngeal swab specimens and should not be used as a sole basis for treatment. Nasal washings and aspirates are unacceptable for Xpert Xpress SARS-CoV-2/FLU/RSV testing.  Fact Sheet for Patients: EntrepreneurPulse.com.au  Fact Sheet for Healthcare Providers: IncredibleEmployment.be  This test is not yet approved or cleared by the [place] FDA and has been authorized for detection and/or diagnosis of SARS-CoV-2 by FDA under an Emergency Use Authorization (EUA). This EUA will remain in effect (meaning this test can be used) for the duration of the COVID-19 declaration under Section 564(b)(1) of the Act, 21 U.S.C. section 360bbb-3(b)(1), unless the authorization is terminated or revoked.  Performed at [facility], [place] [address], [place], [place] [address]      Time coordinating discharge: 35 minutes  SIGNED:   Darliss Cheney, MD  [facility]

## 2021-10-31 ENCOUNTER — Emergency Department (HOSPITAL_COMMUNITY): Payer: Medicare Other

## 2021-10-31 ENCOUNTER — Inpatient Hospital Stay (HOSPITAL_COMMUNITY)
Admission: EM | Admit: 2021-10-31 | Discharge: 2021-11-04 | DRG: 871 | Disposition: A | Discharge status: Skilled Nursing Facility | Payer: Medicare Other | Source: Skilled Nursing Facility | Attending: Internal Medicine | Admitting: Internal Medicine

## 2021-10-31 ENCOUNTER — Other Ambulatory Visit: Payer: Self-pay

## 2021-10-31 DIAGNOSIS — A4189 Other specified sepsis: Secondary | ICD-10-CM | POA: Diagnosis not present

## 2021-10-31 DIAGNOSIS — Z8249 Family history of ischemic heart disease and other diseases of the circulatory system: Secondary | ICD-10-CM

## 2021-10-31 DIAGNOSIS — Z66 Do not resuscitate: Secondary | ICD-10-CM | POA: Diagnosis present

## 2021-10-31 DIAGNOSIS — Z8673 Personal history of transient ischemic attack (TIA), and cerebral infarction without residual deficits: Secondary | ICD-10-CM

## 2021-10-31 DIAGNOSIS — U071 COVID-19: Secondary | ICD-10-CM | POA: Diagnosis present

## 2021-10-31 DIAGNOSIS — I5033 Acute on chronic diastolic (congestive) heart failure: Secondary | ICD-10-CM | POA: Diagnosis present

## 2021-10-31 DIAGNOSIS — R0602 Shortness of breath: Secondary | ICD-10-CM

## 2021-10-31 DIAGNOSIS — Z79899 Other long term (current) drug therapy: Secondary | ICD-10-CM

## 2021-10-31 DIAGNOSIS — R296 Repeated falls: Secondary | ICD-10-CM | POA: Diagnosis present

## 2021-10-31 DIAGNOSIS — E872 Acidosis, unspecified: Secondary | ICD-10-CM | POA: Diagnosis present

## 2021-10-31 DIAGNOSIS — J9601 Acute respiratory failure with hypoxia: Secondary | ICD-10-CM | POA: Diagnosis present

## 2021-10-31 DIAGNOSIS — I080 Rheumatic disorders of both mitral and aortic valves: Secondary | ICD-10-CM | POA: Diagnosis present

## 2021-10-31 DIAGNOSIS — Z993 Dependence on wheelchair: Secondary | ICD-10-CM

## 2021-10-31 DIAGNOSIS — Z96653 Presence of artificial knee joint, bilateral: Secondary | ICD-10-CM | POA: Diagnosis present

## 2021-10-31 DIAGNOSIS — Z96641 Presence of right artificial hip joint: Secondary | ICD-10-CM | POA: Diagnosis present

## 2021-10-31 DIAGNOSIS — F17210 Nicotine dependence, cigarettes, uncomplicated: Secondary | ICD-10-CM | POA: Diagnosis present

## 2021-10-31 DIAGNOSIS — F0150 Vascular dementia without behavioral disturbance: Secondary | ICD-10-CM | POA: Diagnosis present

## 2021-10-31 DIAGNOSIS — I10 Essential (primary) hypertension: Secondary | ICD-10-CM | POA: Diagnosis present

## 2021-10-31 DIAGNOSIS — I110 Hypertensive heart disease with heart failure: Secondary | ICD-10-CM | POA: Diagnosis present

## 2021-10-31 DIAGNOSIS — Z7982 Long term (current) use of aspirin: Secondary | ICD-10-CM

## 2021-10-31 DIAGNOSIS — Z79890 Hormone replacement therapy (postmenopausal): Secondary | ICD-10-CM

## 2021-10-31 DIAGNOSIS — W19XXXA Unspecified fall, initial encounter: Principal | ICD-10-CM

## 2021-10-31 DIAGNOSIS — J1282 Pneumonia due to coronavirus disease 2019: Secondary | ICD-10-CM | POA: Diagnosis present

## 2021-10-31 LAB — CBC WITH DIFFERENTIAL/PLATELET
Abs Immature Granulocytes: 0.06 10*3/uL (ref 0.00–0.07)
Basophils Absolute: 0 10*3/uL (ref 0.0–0.1)
Basophils Relative: 0 %
Eosinophils Absolute: 0 10*3/uL (ref 0.0–0.5)
Eosinophils Relative: 0 %
HCT: 36.7 % (ref 36.0–46.0)
Hemoglobin: 12.2 g/dL (ref 12.0–15.0)
Immature Granulocytes: 1 %
Lymphocytes Relative: 8 %
Lymphs Abs: 0.9 10*3/uL (ref 0.7–4.0)
MCH: 29.4 pg (ref 26.0–34.0)
MCHC: 33.2 g/dL (ref 30.0–36.0)
MCV: 88.4 fL (ref 80.0–100.0)
Monocytes Absolute: 1.2 10*3/uL — ABNORMAL HIGH (ref 0.1–1.0)
Monocytes Relative: 11 %
Neutro Abs: 8.8 10*3/uL — ABNORMAL HIGH (ref 1.7–7.7)
Neutrophils Relative %: 80 %
Platelets: 188 10*3/uL (ref 150–400)
RBC: 4.15 MIL/uL (ref 3.87–5.11)
RDW: 14.4 % (ref 11.5–15.5)
WBC: 10.9 10*3/uL — ABNORMAL HIGH (ref 4.0–10.5)
nRBC: 0 % (ref 0.0–0.2)

## 2021-10-31 LAB — LACTIC ACID, PLASMA: Lactic Acid, Venous: 2.8 mmol/L (ref 0.5–1.9)

## 2021-10-31 LAB — COMPREHENSIVE METABOLIC PANEL
ALT: 14 U/L (ref 0–44)
AST: 22 U/L (ref 15–41)
Albumin: 3.5 g/dL (ref 3.5–5.0)
Alkaline Phosphatase: 50 U/L (ref 38–126)
Anion gap: 14 (ref 5–15)
BUN: 10 mg/dL (ref 8–23)
CO2: 24 mmol/L (ref 22–32)
Calcium: 9.3 mg/dL (ref 8.9–10.3)
Chloride: 100 mmol/L (ref 98–111)
Creatinine, Ser: 0.79 mg/dL (ref 0.44–1.00)
GFR, Estimated: >60 mL/min (ref >60)
Glucose, Bld: 127 mg/dL — ABNORMAL HIGH (ref 70–99)
Potassium: 3.7 mmol/L (ref 3.5–5.1)
Sodium: 138 mmol/L (ref 135–145)
Total Bilirubin: 0.3 mg/dL (ref 0.3–1.2)
Total Protein: 6.7 g/dL (ref 6.5–8.1)

## 2021-10-31 LAB — PROTIME-INR
INR: 1.1 (ref 0.8–1.2)
Prothrombin Time: 13.8 seconds (ref 11.4–15.2)

## 2021-10-31 LAB — APTT: aPTT: 32 seconds (ref 24–36)

## 2021-10-31 LAB — RESP PANEL BY RT-PCR (FLU A&B, COVID) ARPGX2
Influenza A by PCR: NEGATIVE
Influenza B by PCR: NEGATIVE
SARS Coronavirus 2 by RT PCR: POSITIVE — AB

## 2021-10-31 MED ORDER — LACTATED RINGERS IV BOLUS
500.0000 mL | Freq: Once | INTRAVENOUS | Status: AC
  Administered 2021-10-31: 500 mL via INTRAVENOUS

## 2021-10-31 MED ORDER — ACETAMINOPHEN 500 MG PO TABS
1000.0000 mg | ORAL_TABLET | Freq: Once | ORAL | Status: AC
Start: 2021-10-31 — End: 2021-10-31
  Administered 2021-10-31: 1000 mg via ORAL
  Filled 2021-10-31: qty 2

## 2021-10-31 NOTE — ED Notes (Signed)
RN placed pt on 2L O2 via nasal cannula after SpO2 decreased to 87% on RA while pt was sleeping.

## 2021-10-31 NOTE — ED Notes (Signed)
Patient transported to CT 

## 2021-10-31 NOTE — ED Provider Notes (Signed)
[facility] EMERGENCY DEPARTMENT Provider Note   CSN: [id number] Arrival date & time: 10/31/21  2051     History  Chief Complaint  Patient presents with   Fall    Christine Christensen is a 76 y.o. female.  Patient is a 76 year old female with a history of prior stroke, vascular dementia, aortic and mitral stenosis, prior syncope and hypertension who is presenting today after an unwitnessed fall.  Patient reports that she felt like she just went out but she remembers falling to the floor.  She had reported to staff that she was trying to get back into bed.  She denies injuring herself in the fall.  She does not believe that she hit her head denies a headache or neck pain.  She told staff that she felt like she had a cold but denied any cough, congestion or shortness of breath here.  She denies any abdominal pain, nausea or vomiting.  She has not had any diarrhea.  She denies any pain in her arms or her legs.  She does report she is fallen before but is not sure what caused her to fall tonight.  She is supposed to use a walker but unsure if she was using that at the time of the fall.  The history is provided by the patient, the nursing home and the EMS personnel.  Fall      Home Medications Prior to Admission medications   Medication Sig Start Date End Date Taking? Authorizing Provider  acetaminophen (TYLENOL) 500 MG tablet Take 2 tablets (1,000 mg total) by mouth every 6 (six) hours as needed for mild pain, moderate pain, fever or headache. 06/25/21   Regalado, Belkys A, MD  aspirin 325 MG tablet Take 1 tablet (325 mg total) by mouth daily. 06/25/21   Regalado, Belkys A, MD  enoxaparin (LOVENOX) 40 MG/0.4ML injection Inject 0.4 mLs (40 mg total) into the skin daily. 06/25/21   Regalado, Belkys A, MD  escitalopram (LEXAPRO) 20 MG tablet Take 20 mg daily by mouth.     [provider]  feeding supplement (ENSURE ENLIVE / ENSURE PLUS) LIQD Take 237 mLs by mouth 2 (two)  times daily between meals. 06/25/21   Regalado, Belkys A, MD  fexofenadine (ALLEGRA) 180 MG tablet Take 180 mg by mouth daily as needed for allergies or rhinitis.    [provider]  melatonin 3 MG TABS tablet Take 1 tablet (3 mg total) by mouth at bedtime. 06/25/21   Regalado, Belkys A, MD  metoprolol tartrate (LOPRESSOR) 25 MG tablet Take 0.5 tablets (12.5 mg total) by mouth 2 (two) times daily. 06/25/21   Regalado, Belkys A, MD  Multiple Vitamin (MULTIVITAMIN WITH MINERALS) TABS tablet Take 1 tablet by mouth daily. 06/25/21   Regalado, Belkys A, MD  naproxen sodium (ALEVE) 220 MG tablet Take 220 mg by mouth daily as needed (back pain).    [provider]  polyethylene glycol (MIRALAX / GLYCOLAX) 17 g packet Take 17 g by mouth 2 (two) times daily. 06/25/21   Regalado, Belkys A, MD  QUEtiapine (SEROQUEL) 25 MG tablet Take 0.5 tablets (12.5 mg total) by mouth daily as needed (agitation unable to redirect). 06/27/21   Regalado, Belkys A, MD  senna-docusate (SENOKOT-S) 8.6-50 MG tablet Take 1 tablet by mouth 2 (two) times daily. 06/25/21   Regalado, Cassie Freer, MD      Allergies    Penicillins    Review of Systems   Review of Systems  Physical  Exam Updated Vital Signs BP 131/80    Pulse (!) 101    Temp (!) 100.7 F (38.2 C) (Oral)    Resp (!) 27    SpO2 96%  Physical Exam Vitals and nursing note reviewed.  Constitutional:      General: She is in acute distress.     Appearance: She is well-developed.  HENT:     Head: Normocephalic and atraumatic.     Comments: No trauma noted to the head    Mouth/Throat:     Mouth: Mucous membranes are moist.  Eyes:     Pupils: Pupils are equal, round, and reactive to light.  Cardiovascular:     Rate and Rhythm: Regular rhythm. Tachycardia present.     Heart sounds: Murmur heard.    No friction rub.  Pulmonary:     Effort: Pulmonary effort is normal.     Breath sounds: Rhonchi present. No wheezing or rales.  Abdominal:     General:  Bowel sounds are normal. There is no distension.     Palpations: Abdomen is soft.     Tenderness: There is no abdominal tenderness. There is no guarding or rebound.  Musculoskeletal:        General: No tenderness. Normal range of motion.     Cervical back: Normal range of motion and neck supple.     Right lower leg: No edema.     Left lower leg: No edema.     Comments: Full ROM of bilateral hips, knees and ankles.  Mild swelling and restriction of the right knee but no pain, warmth or erythema.  Normal ROM of bilateral wrist, elbows and shoulders.  No edema  Skin:    General: Skin is warm and dry.     Findings: No rash.  Neurological:     Mental Status: She is alert. Mental status is at baseline.     Cranial Nerves: No cranial nerve deficit.     Motor: No weakness.     Comments: Alert and oriented x 2  Psychiatric:        Mood and Affect: Mood normal.        Behavior: Behavior normal.    ED Results / Procedures / Treatments   Labs (all labs ordered are listed, but only abnormal results are displayed) Labs Reviewed  RESP PANEL BY RT-PCR (FLU A&B, COVID) ARPGX2 - Abnormal; Notable for the following components:      Result Value   SARS Coronavirus 2 by RT PCR POSITIVE (*)    All other components within normal limits  LACTIC ACID, PLASMA - Abnormal; Notable for the following components:   Lactic Acid, Venous 2.8 (*)    All other components within normal limits  COMPREHENSIVE METABOLIC PANEL - Abnormal; Notable for the following components:   Glucose, Bld 127 (*)    All other components within normal limits  CBC WITH DIFFERENTIAL/PLATELET - Abnormal; Notable for the following components:   WBC 10.9 (*)    Neutro Abs 8.8 (*)    Monocytes Absolute 1.2 (*)    All other components within normal limits  CULTURE, BLOOD (ROUTINE X 2)  CULTURE, BLOOD (ROUTINE X 2)  URINE CULTURE  PROTIME-INR  APTT  LACTIC ACID, PLASMA  URINALYSIS, ROUTINE W REFLEX MICROSCOPIC  BRAIN NATRIURETIC  PEPTIDE    EKG EKG Interpretation  Date/Time:  Monday October 31 2021 21:01:10 EST Ventricular Rate:  106 PR Interval:  145 QRS Duration: 78 QT Interval:  350 QTC Calculation: 465 R  Axis:   -34 Text Interpretation: Sinus tachycardia Premature atrial complexes Left axis deviation Confirmed by Blanchie Dessert [id number]) on 10/31/2021 9:24:04 PM  Radiology CT Head Wo Contrast  Result Date: 10/31/2021 CLINICAL DATA:  Head trauma, minor (Age >= 65y).  Fall. EXAM: CT HEAD WITHOUT CONTRAST TECHNIQUE: Contiguous axial images were obtained from the base of the skull through the vertex without intravenous contrast. RADIATION DOSE REDUCTION: This exam was performed according to the departmental dose-optimization program which includes automated exposure control, adjustment of the mA and/or kV according to patient size and/or use of iterative reconstruction technique. COMPARISON:  06/25/2021 FINDINGS: Brain: There is atrophy and chronic small vessel disease changes. No acute intracranial abnormality. Specifically, no hemorrhage, hydrocephalus, mass lesion, acute infarction, or significant intracranial injury. Vascular: No hyperdense vessel or unexpected calcification. Skull: No acute calvarial abnormality. Sinuses/Orbits: No acute findings Other: None IMPRESSION: Atrophy, chronic microvascular disease. No acute intracranial abnormality. Electronically Signed   By: Rolm Baptise M.D.   On: 10/31/2021 22:17   DG Chest Port 1 View  Result Date: 10/31/2021 CLINICAL DATA:  Questionable sepsis EXAM: PORTABLE CHEST 1 VIEW COMPARISON:  06/12/2021 FINDINGS: Heart is normal size. Vascular congestion. Interstitial prominence. No focal confluent opacities or effusions. No acute bony abnormality. IMPRESSION: Mild vascular congestion and interstitial prominence. Findings could reflect interstitial edema. Electronically Signed   By: Rolm Baptise M.D.   On: 10/31/2021 21:40    Procedures Procedures    Medications  Ordered in ED Medications  acetaminophen (TYLENOL) tablet 1,000 mg (1,000 mg Oral Given 10/31/21 2142)  lactated ringers bolus 500 mL (500 mLs Intravenous New Bag/Given 10/31/21 2326)    ED Course/ Medical Decision Making/ A&P                           Medical Decision Making Amount and/or Complexity of Data Reviewed External Data Reviewed: radiology and notes. Labs: ordered. Decision-making details documented in ED Course. Radiology: ordered and independent interpretation performed. Decision-making details documented in ED Course. ECG/medicine tests: ordered and independent interpretation performed. Decision-making details documented in ED Course.  Risk OTC drugs.   Elderly female with multiple comorbidities who is presenting today after an unwitnessed fall at her living facility.  Here patient is awake and alert and denies any headache or neck pain.  It is reported that she is on anticoagulation and unclear if she had any head injury.  She has no evidence externally of injury to her head.  She does not think she hit her head.  However patient here is febrile to 100.7 and mildly tachycardic.  Oxygen saturation between 90 to 94% on room air.  She denies feeling short of breath however concern for possible early sepsis versus COVID.  She does have rhonchi on chest exam and concern for possible pneumonia.  Blood pressure is within normal limits.  Patient has no evidence of fluid overload.  Able to range all joints and low suspicion for acute fracture.  Patient's EKG shows sinus tachycardia with PACs but no other acute findings.  Patient given Tylenol for her fever.  Labs are pending.  11:34 PM I independently interpreted patient's chest x-ray, head CT and labs.  Chest x-ray with mild patchy infiltrate which radiology felt could reflect interstitial edema.  Head CT without evidence of acute bleed.  CMP within normal limits today, lactate mildly elevated 2.8, CBC with mild leukocytosis of 10.9 and  normal hemoglobin and platelets.  Patient did test COVID-positive today.  While sleeping patient's oxygen saturation dropped to 87% however when she is awake it is greater than 90.  Called pharmacy to discuss if she would be a candidate for Paxlovid.  Patient given gentle fluids will need a repeat lactate but hopefully will be able to go home with Paxlovid therapy.  Low suspicion for CHF at this time as patient has no distal edema and based on echo from September 2022 she had an EF of greater than 75% and grade 2 diastolic dysfunction.  Patient checked up to Dr. Sedonia Small for repeat lactate after fluid and he will order Paxlovid if she is a candidate.  Findings discussed with the pt.        Final Clinical Impression(s) / ED Diagnoses Final diagnoses:  None    Rx / DC Orders ED Discharge Orders     None         Blanchie Dessert, MD 10/31/21 2349

## 2021-10-31 NOTE — ED Triage Notes (Signed)
Pt arrives via [facility] from [facility] after being found down on the floor. Pt states she tripped and fell while trying to get into bed, hx of dementia. Per staff they did not know her baseline mentation, states that the pt uses a wheelchair. Pt has swelling to R knee, she denies pain but says she feels like she has a cold. Alert to self and place only.

## 2021-11-01 ENCOUNTER — Encounter (HOSPITAL_COMMUNITY): Payer: Self-pay | Admitting: Internal Medicine

## 2021-11-01 DIAGNOSIS — Z96641 Presence of right artificial hip joint: Secondary | ICD-10-CM | POA: Diagnosis present

## 2021-11-01 DIAGNOSIS — Z993 Dependence on wheelchair: Secondary | ICD-10-CM | POA: Diagnosis not present

## 2021-11-01 DIAGNOSIS — F0150 Vascular dementia without behavioral disturbance: Secondary | ICD-10-CM | POA: Diagnosis present

## 2021-11-01 DIAGNOSIS — I10 Essential (primary) hypertension: Secondary | ICD-10-CM | POA: Diagnosis present

## 2021-11-01 DIAGNOSIS — E872 Acidosis, unspecified: Secondary | ICD-10-CM | POA: Diagnosis present

## 2021-11-01 DIAGNOSIS — A4189 Other specified sepsis: Secondary | ICD-10-CM | POA: Diagnosis present

## 2021-11-01 DIAGNOSIS — U071 COVID-19: Secondary | ICD-10-CM | POA: Diagnosis present

## 2021-11-01 DIAGNOSIS — R296 Repeated falls: Secondary | ICD-10-CM | POA: Diagnosis present

## 2021-11-01 DIAGNOSIS — J9601 Acute respiratory failure with hypoxia: Secondary | ICD-10-CM | POA: Diagnosis present

## 2021-11-01 DIAGNOSIS — Z7982 Long term (current) use of aspirin: Secondary | ICD-10-CM | POA: Diagnosis not present

## 2021-11-01 DIAGNOSIS — Z8673 Personal history of transient ischemic attack (TIA), and cerebral infarction without residual deficits: Secondary | ICD-10-CM | POA: Diagnosis not present

## 2021-11-01 DIAGNOSIS — Z66 Do not resuscitate: Secondary | ICD-10-CM | POA: Diagnosis present

## 2021-11-01 DIAGNOSIS — W19XXXA Unspecified fall, initial encounter: Secondary | ICD-10-CM

## 2021-11-01 DIAGNOSIS — Y92009 Unspecified place in unspecified non-institutional (private) residence as the place of occurrence of the external cause: Secondary | ICD-10-CM | POA: Diagnosis not present

## 2021-11-01 DIAGNOSIS — I110 Hypertensive heart disease with heart failure: Secondary | ICD-10-CM | POA: Diagnosis present

## 2021-11-01 DIAGNOSIS — Z79890 Hormone replacement therapy (postmenopausal): Secondary | ICD-10-CM | POA: Diagnosis not present

## 2021-11-01 DIAGNOSIS — Z79899 Other long term (current) drug therapy: Secondary | ICD-10-CM | POA: Diagnosis not present

## 2021-11-01 DIAGNOSIS — I5033 Acute on chronic diastolic (congestive) heart failure: Secondary | ICD-10-CM | POA: Diagnosis present

## 2021-11-01 DIAGNOSIS — J1282 Pneumonia due to coronavirus disease 2019: Secondary | ICD-10-CM | POA: Diagnosis present

## 2021-11-01 DIAGNOSIS — Z96653 Presence of artificial knee joint, bilateral: Secondary | ICD-10-CM | POA: Diagnosis present

## 2021-11-01 DIAGNOSIS — F17210 Nicotine dependence, cigarettes, uncomplicated: Secondary | ICD-10-CM | POA: Diagnosis present

## 2021-11-01 DIAGNOSIS — Z8249 Family history of ischemic heart disease and other diseases of the circulatory system: Secondary | ICD-10-CM | POA: Diagnosis not present

## 2021-11-01 DIAGNOSIS — I080 Rheumatic disorders of both mitral and aortic valves: Secondary | ICD-10-CM | POA: Diagnosis present

## 2021-11-01 LAB — PROCALCITONIN: Procalcitonin: <0.1 ng/mL

## 2021-11-01 LAB — LACTIC ACID, PLASMA
Lactic Acid, Venous: 2.5 mmol/L (ref 0.5–1.9)
Lactic Acid, Venous: 1.1 mmol/L (ref 0.5–1.9)

## 2021-11-01 LAB — URINALYSIS, ROUTINE W REFLEX MICROSCOPIC
Bilirubin Urine: NEGATIVE
Glucose, UA: NEGATIVE mg/dL
Hgb urine dipstick: NEGATIVE
Ketones, ur: NEGATIVE mg/dL
Leukocytes,Ua: NEGATIVE
Nitrite: NEGATIVE
Protein, ur: NEGATIVE mg/dL
Specific Gravity, Urine: 1.01 (ref 1.005–1.030)
pH: 5 (ref 5.0–8.0)

## 2021-11-01 LAB — D-DIMER, QUANTITATIVE: D-Dimer, Quant: 0.68 ug/mL-FEU — ABNORMAL HIGH (ref 0.00–0.50)

## 2021-11-01 LAB — C-REACTIVE PROTEIN: CRP: 8 mg/dL — ABNORMAL HIGH (ref <1.0)

## 2021-11-01 LAB — BRAIN NATRIURETIC PEPTIDE: B Natriuretic Peptide: 1428.6 pg/mL — ABNORMAL HIGH (ref 0.0–100.0)

## 2021-11-01 MED ORDER — NIRMATRELVIR/RITONAVIR (PAXLOVID)TABLET
3.0000 | ORAL_TABLET | Freq: Two times a day (BID) | ORAL | Status: DC
  Administered 2021-11-02 – 2021-11-04 (×6): 3 via ORAL
  Filled 2021-11-01: qty 30

## 2021-11-01 MED ORDER — ZINC SULFATE 220 (50 ZN) MG PO CAPS
220.0000 mg | ORAL_CAPSULE | Freq: Every day | ORAL | Status: DC
  Administered 2021-11-01 – 2021-11-04 (×4): 220 mg via ORAL
  Filled 2021-11-01 (×4): qty 1

## 2021-11-01 MED ORDER — METOPROLOL TARTRATE 12.5 MG HALF TABLET
12.5000 mg | ORAL_TABLET | Freq: Two times a day (BID) | ORAL | Status: DC
  Administered 2021-11-01 – 2021-11-04 (×7): 12.5 mg via ORAL
  Filled 2021-11-01 (×7): qty 1

## 2021-11-01 MED ORDER — GUAIFENESIN-DM 100-10 MG/5ML PO SYRP
10.0000 mL | ORAL_SOLUTION | ORAL | Status: DC | PRN
  Administered 2021-11-02: 10 mL via ORAL
  Filled 2021-11-01: qty 10

## 2021-11-01 MED ORDER — ONDANSETRON HCL 4 MG PO TABS: 4.0000 mg | ORAL_TABLET | Freq: Four times a day (QID) | ORAL | Status: DC | PRN

## 2021-11-01 MED ORDER — QUETIAPINE FUMARATE 25 MG PO TABS
12.5000 mg | ORAL_TABLET | Freq: Every day | ORAL | Status: DC | PRN
  Filled 2021-11-01: qty 1

## 2021-11-01 MED ORDER — ACETAMINOPHEN 325 MG PO TABS
650.0000 mg | ORAL_TABLET | Freq: Four times a day (QID) | ORAL | Status: DC | PRN
  Administered 2021-11-02: 650 mg via ORAL
  Filled 2021-11-01: qty 2

## 2021-11-01 MED ORDER — ACETAMINOPHEN 650 MG RE SUPP: 650.0000 mg | Freq: Four times a day (QID) | RECTAL | Status: DC | PRN

## 2021-11-01 MED ORDER — NIRMATRELVIR/RITONAVIR (PAXLOVID)TABLET
3.0000 | ORAL_TABLET | Freq: Two times a day (BID) | ORAL | Status: DC
  Administered 2021-11-01: 3 via ORAL
  Filled 2021-11-01: qty 30

## 2021-11-01 MED ORDER — METHYLPREDNISOLONE SODIUM SUCC 125 MG IJ SOLR
80.0000 mg | INTRAMUSCULAR | Status: DC
  Administered 2021-11-02: 80 mg via INTRAVENOUS
  Filled 2021-11-01: qty 2

## 2021-11-01 MED ORDER — FUROSEMIDE 10 MG/ML IJ SOLN
20.0000 mg | Freq: Once | INTRAMUSCULAR | Status: AC
  Administered 2021-11-01: 20 mg via INTRAVENOUS
  Filled 2021-11-01: qty 2

## 2021-11-01 MED ORDER — PREDNISONE 20 MG PO TABS: 50.0000 mg | ORAL_TABLET | Freq: Every day | ORAL | Status: DC

## 2021-11-01 MED ORDER — ASPIRIN 325 MG PO TABS
325.0000 mg | ORAL_TABLET | Freq: Every day | ORAL | Status: DC
  Administered 2021-11-01 – 2021-11-04 (×4): 325 mg via ORAL
  Filled 2021-11-01 (×4): qty 1

## 2021-11-01 MED ORDER — SODIUM CHLORIDE 0.9 % IV SOLN
200.0000 mg | Freq: Once | INTRAVENOUS | Status: AC
  Administered 2021-11-01: 200 mg via INTRAVENOUS
  Filled 2021-11-01: qty 40

## 2021-11-01 MED ORDER — ENOXAPARIN SODIUM 40 MG/0.4ML IJ SOSY
40.0000 mg | PREFILLED_SYRINGE | INTRAMUSCULAR | Status: DC
  Administered 2021-11-01 – 2021-11-04 (×4): 40 mg via SUBCUTANEOUS
  Filled 2021-11-01 (×4): qty 0.4

## 2021-11-01 MED ORDER — SENNOSIDES-DOCUSATE SODIUM 8.6-50 MG PO TABS
1.0000 | ORAL_TABLET | Freq: Two times a day (BID) | ORAL | Status: DC
  Administered 2021-11-01 – 2021-11-04 (×7): 1 via ORAL
  Filled 2021-11-01 (×7): qty 1

## 2021-11-01 MED ORDER — METHYLPREDNISOLONE SODIUM SUCC 125 MG IJ SOLR
60.0000 mg | Freq: Every day | INTRAMUSCULAR | Status: DC
  Administered 2021-11-01: 60 mg via INTRAVENOUS
  Filled 2021-11-01: qty 2

## 2021-11-01 MED ORDER — SODIUM CHLORIDE 0.9 % IV SOLN: 100.0000 mg | Freq: Every day | INTRAVENOUS | Status: DC

## 2021-11-01 MED ORDER — ESCITALOPRAM OXALATE 20 MG PO TABS
20.0000 mg | ORAL_TABLET | Freq: Every day | ORAL | Status: DC
  Administered 2021-11-01 – 2021-11-04 (×4): 20 mg via ORAL
  Filled 2021-11-01: qty 2
  Filled 2021-11-01 (×3): qty 1

## 2021-11-01 MED ORDER — MELATONIN 3 MG PO TABS
3.0000 mg | ORAL_TABLET | Freq: Every day | ORAL | Status: DC
  Administered 2021-11-01 – 2021-11-03 (×3): 3 mg via ORAL
  Filled 2021-11-01 (×3): qty 1

## 2021-11-01 MED ORDER — POLYETHYLENE GLYCOL 3350 17 G PO PACK: 17.0000 g | PACK | Freq: Every day | ORAL | Status: DC | PRN

## 2021-11-01 MED ORDER — HYDRALAZINE HCL 20 MG/ML IJ SOLN: 10.0000 mg | Freq: Four times a day (QID) | INTRAMUSCULAR | Status: DC | PRN

## 2021-11-01 MED ORDER — POTASSIUM CHLORIDE CRYS ER 20 MEQ PO TBCR
40.0000 meq | EXTENDED_RELEASE_TABLET | Freq: Once | ORAL | Status: AC
Start: 2021-11-01 — End: 2021-11-01
  Administered 2021-11-01: 40 meq via ORAL
  Filled 2021-11-01: qty 2

## 2021-11-01 MED ORDER — ALBUTEROL SULFATE HFA 108 (90 BASE) MCG/ACT IN AERS
2.0000 | INHALATION_SPRAY | RESPIRATORY_TRACT | Status: DC | PRN
  Filled 2021-11-01: qty 6.7

## 2021-11-01 MED ORDER — ONDANSETRON HCL 4 MG/2ML IJ SOLN: 4.0000 mg | Freq: Four times a day (QID) | INTRAMUSCULAR | Status: DC | PRN

## 2021-11-01 MED ORDER — LEVOFLOXACIN 500 MG PO TABS
500.0000 mg | ORAL_TABLET | Freq: Every day | ORAL | Status: AC
  Administered 2021-11-01 – 2021-11-03 (×3): 500 mg via ORAL
  Filled 2021-11-01 (×3): qty 1

## 2021-11-01 MED ORDER — ASCORBIC ACID 500 MG PO TABS
500.0000 mg | ORAL_TABLET | Freq: Every day | ORAL | Status: DC
  Administered 2021-11-01 – 2021-11-04 (×4): 500 mg via ORAL
  Filled 2021-11-01 (×4): qty 1

## 2021-11-01 NOTE — ED Notes (Signed)
Pt placed on hospital bed

## 2021-11-01 NOTE — H&P (Addendum)
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  Patient is a poor historian due to known history of vascular dementia although patient is able to provide some history.  History is been obtained from the combination of the patient, discussion with the emergency department staff as well as review of triage and EMS notes.  Patient was found in her room lying on the floor beside her bed.  Patient and facility staff report the patient stated she slipped and fell when trying to get into bed.  Patient denies loss of consciousness or head trauma.  Facility staff report the patient is typically nonambulatory at baseline and uses a wheelchair to ambulate.  EMS was contacted and promptly came to evaluate the patient and brought her into [facility] emergency department for evaluation.  Upon evaluation in the emergency department patient was noted to exhibit multiple SIRS criteria including fever of 100.7 F, tachycardia, leukocytosis and tachypnea.  COVID-19 PCR testing was found to be positive.  ER provider provided patient with a 500 cc bolus of lactated Ringer's as well as a dose of Paxlovid.  The hospitalist group was then called to assess the patient for admission to the hospital.  Review of Systems: Review of Systems   Respiratory:  Positive for cough and shortness of breath.   All other systems reviewed and are negative.   Past Medical History:  Diagnosis Date   Anemia    history of anemia   Aortic stenosis    mild aortic stenosis 07/27/17 echo   Arthritis    Heart murmur    Hypertension    Mitral stenosis    moderate mitral stenosis 07/27/17 echo   Stroke [facility])    stroke x2, 2005 and 2008, some right sided weakness   Syncope and collapse 10/25/2017   Urinary incontinence     Past Surgical History:  Procedure Laterality Date   CATARACT EXTRACTION, BILATERAL Bilateral    CHOLECYSTECTOMY     HIP ARTHROPLASTY Right 06/14/2021   Procedure: ARTHROPLASTY BIPOLAR HIP (HEMIARTHROPLASTY);  Surgeon: Willaim Sheng, MD;  Location: [facility];  Service: Orthopedics;  Laterality: Right;   TONSILLECTOMY     TOTAL KNEE ARTHROPLASTY Right    right knee 2006   TOTAL KNEE ARTHROPLASTY Left 08/06/2017   Procedure: LEFT TOTAL KNEE ARTHROPLASTY;  Surgeon: Vickey Huger, MD;  Location: [place];  Service: Orthopedics;  Laterality: Left;   TUBAL LIGATION      Social History:  reports that she has been smoking cigarettes. She has a 28.00 pack-year smoking history. She has never used smokeless tobacco. She reports current alcohol use. She reports current drug use. Drug: Marijuana.  Allergies  Allergen Reactions   Penicillins Other (See Comments)    Unknown reaction    Family History  Problem Relation Age of Onset   Dementia Mother    Heart disease Father    Cerebral  palsy Sister     Prior to Admission medications   Medication Sig Start Date End Date Taking? Authorizing Provider  acetaminophen (TYLENOL) 500 MG tablet Take 2 tablets (1,000 mg total) by mouth every 6 (six) hours as needed for mild pain, moderate pain, fever or headache. 06/25/21   Regalado, Belkys A, MD  aspirin 325 MG tablet Take 1 tablet (325 mg total) by mouth daily. 06/25/21   Regalado, Belkys A, MD  enoxaparin (LOVENOX) 40 MG/0.4ML  injection Inject 0.4 mLs (40 mg total) into the skin daily. 06/25/21   Regalado, Belkys A, MD  escitalopram (LEXAPRO) 20 MG tablet Take 20 mg daily by mouth.     [provider]  feeding supplement (ENSURE ENLIVE / ENSURE PLUS) LIQD Take 237 mLs by mouth 2 (two) times daily between meals. 06/25/21   Regalado, Belkys A, MD  fexofenadine (ALLEGRA) 180 MG tablet Take 180 mg by mouth daily as needed for allergies or rhinitis.    [provider]  melatonin 3 MG TABS tablet Take 1 tablet (3 mg total) by mouth at bedtime. 06/25/21   Regalado, Belkys A, MD  metoprolol tartrate (LOPRESSOR) 25 MG tablet Take 0.5 tablets (12.5 mg total) by mouth 2 (two) times daily. 06/25/21   Regalado, Belkys A, MD  Multiple Vitamin (MULTIVITAMIN WITH MINERALS) TABS tablet Take 1 tablet by mouth daily. 06/25/21   Regalado, Belkys A, MD  naproxen sodium (ALEVE) 220 MG tablet Take 220 mg by mouth daily as needed (back pain).    [provider]  polyethylene glycol (MIRALAX / GLYCOLAX) 17 g packet Take 17 g by mouth 2 (two) times daily. 06/25/21   Regalado, Belkys A, MD  QUEtiapine (SEROQUEL) 25 MG tablet Take 0.5 tablets (12.5 mg total) by mouth daily as needed (agitation unable to redirect). 06/27/21   Regalado, Belkys A, MD  senna-docusate (SENOKOT-S) 8.6-50 MG tablet Take 1 tablet by mouth 2 (two) times daily. 06/25/21   Elmarie Shiley, MD    Physical Exam:  Vitals:   11/01/21 0615 11/01/21 0730 11/01/21 0803 11/01/21 0815  BP: (!) 112/56 (!) 193/156 (!) 145/133 108/62  Pulse: 78 (!) 142 (!) 119 (!) 113  Resp: 13 19 (!) 27 17  Temp:      TempSrc:      SpO2: 97% 92% 91% 91%    Constitutional: Awake alert and oriented x3, patient is in mild distress due to shortness of breath.   Skin: no rashes, no lesions, poor skin turgor noted. Eyes: Pupils are equally reactive to light.  No evidence of scleral icterus or conjunctival pallor.  ENMT: Moist mucous membranes noted.  Posterior pharynx clear of  any exudate or lesions.   Neck: normal, supple, no masses, no thyromegaly.  No evidence of jugular venous distension.   Respiratory: Notable bibasilar and mid field rales.  No evidence of wheezing.  Increased respiratory effort without accessory muscle use.  Cardiovascular: Tachycardic rate with regular rhythm, no murmurs / rubs / gallops. No extremity edema. 2+ pedal pulses. No carotid bruits.  Chest:   Nontender without crepitus or deformity.   Back:   Nontender without crepitus or deformity. Abdomen: Abdomen is soft and nontender.  No evidence of intra-abdominal masses.  Positive bowel sounds noted in all quadrants.   Musculoskeletal: No joint deformity upper and lower extremities. Good ROM, no contractures. Normal muscle tone.  Neurologic: CN 2-12 grossly intact. Sensation intact.  Patient moving all 4 extremities spontaneously.  Patient is following all commands.  Patient  is responsive to verbal stimuli.   Psychiatric: Patient exhibits anxious mood with labile affect.    Patient seems to possess insight as to their current situation.    Data Reviewed:  I have personally reviewed and interpreted labs, imaging.  Significant findings are:  COVID-19 PCR testing found to be positive.   Lactic acid found to be 2.8.   White blood cell count 10.9.   CT imaging of the head without contrast revealing no evidence of acute intracranial abnormality.   Chest x-ray personally reviewed revealing some vascular congestion and bilateral interstitial infiltrates.  EKG: Personally reviewed.  Rhythm is sinus tachycardia with heart rate of 106 bpm.  No dynamic ST segment changes appreciated.    Assessment and Plan: * Sepsis due to COVID-19 [facility])- (present on admission) Patient presenting with complaints of shortness of breath, malaise, weakness and cough productive with frothy white sputum  COVID-19 PCR testing positive. Chest x-ray revealing bilateral infiltrates at least in part associated with  COVID-19 infection.  Airborne and contact isolation initiated. Providing patient with supplemental oxygen for bouts of hypoxia with target oxygen saturation of 94%. Patient has been initiated on a 5-day course of the remdesivir Patient has been initiated on intravenous Solumedrol followed by Prednisone As needed bronchodilator therapy for shortness of breath and wheezing  As needed antitussives for cough  Hydrating patient gently with intravenous isotonic fluids  Following CRP, D-dimer,  CBC daily Blood cultures obtained Close clinical monitoring    Acute on chronic diastolic CHF (congestive heart failure) ([place])- (present on admission) While patient does have COVID-19 and is being treated with remdesivir and steroids believe patient is additionally suffering from a component of congestive heart failure Physical exam reveals slightly elevated jugular venous pulse with bibasilar and mid field rales in the setting of markedly elevated BNP all concerning for at least some component of acute congestive heart failure In addition to treatment for COVID above treating patient with a trial dose of 20 mg of intravenous Lasix. We will monitor for clinical response to evaluate for any symptomatic improvement Strict input and output monitoring Monitoring of electrolytes renal function with serial chemistries Monitoring patient on telemetry  Fall at home, initial encounter Fall likely secondary to weakness due to underlying illness including both COVID-19 infection and likely acute congestive heart failure. Treating underlying conditions as noted above PT evaluation Fall precautions  Lactic acidosis- (present on admission) Notable lactic acidosis of 2.8 on arrival Lactic acidosis likely secondary to transient hypoxia secondary to underlying COVID-19 and acute congestive heart failure Providing patient with supplemental oxygen, treating underlying illness Performing serial lactic acid levels to  ensure downtrending and resolution  Essential hypertension- (present on admission) Continue home regimen of metoprolol As needed intravenous antihypertensives for markedly elevated blood pressure  Vascular dementia ([place])- (present on admission) known history of dementia and cognitive deficit Mild disease, patient apparently is still able to provide a history and make her own decisions. Minimizing uncomfortable stimuli Minimizing mood altering and sedating agents         Code Status:  Full code  code status decision has been confirmed with: patient Family Communication: deferred   Consults: None  Severity of Illness:  The appropriate patient status for this patient is INPATIENT. Inpatient status is judged to be reasonable and necessary in order to provide the required intensity of service to ensure the patient's safety. The patient's presenting symptoms, physical exam findings, and initial radiographic and laboratory data in the context of their chronic comorbidities  is felt to place them at high risk for further clinical deterioration. Furthermore, it is not anticipated that the patient will be medically stable for discharge from the hospital within 2 midnights of admission.   * I certify that at the point of admission it is my clinical judgment that the patient will require inpatient hospital care spanning beyond 2 midnights from the point of admission due to high intensity of service, high risk for further deterioration and high frequency of surveillance required.*  Author:  Vernelle Emerald MD  11/01/2021 8:44 AM

## 2021-11-01 NOTE — Assessment & Plan Note (Signed)
·   While patient does have COVID-19 and is being treated with remdesivir and steroids believe patient is additionally suffering from a component of congestive heart failure  Physical exam reveals slightly elevated jugular venous pulse with bibasilar and mid field rales in the setting of markedly elevated BNP all concerning for at least some component of acute congestive heart failure  In addition to treatment for COVID above treating patient with a trial dose of 20 mg of intravenous Lasix.  We will monitor for clinical response to evaluate for any symptomatic improvement  Strict input and output monitoring  Monitoring of electrolytes renal function with serial chemistries  Monitoring patient on telemetry

## 2021-11-01 NOTE — Assessment & Plan Note (Signed)
·   known history of dementia and cognitive deficit  Mild disease, patient apparently is still able to provide a history and make her own decisions.  Minimizing uncomfortable stimuli  Minimizing mood altering and sedating agents

## 2021-11-01 NOTE — Progress Notes (Signed)
PROGRESS NOTE                                                                                                                                                                                                             Patient Demographics:    Christine Christensen, is a 76 y.o. female, DOB - 1946-07-31, ZOX:[id number]  Outpatient Primary MD for the patient is Christine Ebbs, MD    LOS - 0  Admit date - 10/31/2021    Chief Complaint  Patient presents with   Fall       Brief Narrative (HPI from H&P)   - Patient admitted few hours ago for fall at home due to generalized weakness and deconditioning, with underlying sepsis due to COVID-19 infection along with acute on chronic CHF.  Is been appropriately treated with IV steroids, antivirals started in the ER will complete 3 days, has a productive cough with yellowish color hence will add Levaquin for 3 days as well.  Trial of Lasix due to elevated proBNP and some crackles on exam.  Monitor closely.  At risk for delirium due to underlying dementia.  According to the son lately she has had multiple falls at the nursing home and seems to be rapidly declining in terms of her dementia and neurological capacity.   Subjective:    Christine Christensen today has, No headache, No chest pain, No abdominal pain - No Nausea, No new weakness tingling or numbness, +ve productive cough and SOB.   Assessment  & Plan :    Acute Hypoxic Resp. Failure due to Acute Covid 19 Viral Pneumonitis + Acute on Chr.Diastolic CHF EF 81% -  due to COVID-19 infection along with acute on chronic CHF.  Is been appropriately treated with IV steroids, will place on Paxlovid as her decline seems to be acute over the last few days per son, she also has a productive cough with yellowish color hence will add Levaquin for 3 days as well.  Trial of Lasix due to elevated proBNP and some crackles on exam. Note she has had at least 2 Covid 19  vaccines thus far per her son.  Sepsis due to COVID-19 pneumonia seems to have resolved.  Encouraged the patient to sit up in chair in the daytime use I-S and flutter valve for pulmonary toiletry.  Will advance activity and titrate  down oxygen as possible.  SpO2: 99 % O2 Flow Rate (L/min): 2 L/min   2. Underlying Dementia - at risk for delirium, supportive care.  3.  Acute on chronic diastolic CHF EF 03% with severe mitral valve stenosis and mild aortic stenosis.  Supportive care, diurese, outpatient cardiology follow-up.  Continue beta-blocker.  4.  Multiple falls at SNF and failing memory.  Supportive care.  PT OT.   5.  Hypertension.  On beta-blocker continue.       Condition - Extremely Guarded  Family Communication  :  son Christine Christensen [phone] on 11/01/20 >> DNR  Code Status :  DNR  Consults  :  None  PUD Prophylaxis :    Procedures  :     CT Head -  Atrophy, chronic microvascular disease. No acute intracranial abnormality.      Disposition Plan  :    Status is: Inpatient  DVT Prophylaxis  :    enoxaparin (LOVENOX) injection 40 mg Start: 11/01/21 1000  Lab Results  Component Value Date   PLT 188 10/31/2021    Diet :  Diet Order             Diet Heart Room service appropriate? Yes; Fluid consistency: Thin  Diet effective now                    Inpatient Medications  Scheduled Meds:  vitamin C  500 mg Oral Daily   aspirin  325 mg Oral Daily   enoxaparin (LOVENOX) injection  40 mg Subcutaneous Q24H   escitalopram  20 mg Oral Daily   furosemide  20 mg Intravenous Once   levofloxacin  500 mg Oral Daily   melatonin  3 mg Oral QHS   [START ON 11/02/2021] methylPREDNISolone (SOLU-MEDROL) injection  80 mg Intravenous Q24H   metoprolol tartrate  12.5 mg Oral BID   nirmatrelvir/ritonavir EUA  3 tablet Oral BID   potassium chloride  40 mEq Oral Once   senna-docusate  1 tablet Oral BID   zinc sulfate  220 mg Oral Daily   Continuous  Infusions:   PRN Meds:.acetaminophen **OR** acetaminophen, albuterol, guaiFENesin-dextromethorphan, hydrALAZINE, [DISCONTINUED] ondansetron **OR** ondansetron (ZOFRAN) IV, polyethylene glycol, QUEtiapine  Antibiotics  :    Anti-infectives (From admission, onward)    Start     Dose/Rate Route Frequency Ordered Stop   11/02/21 1000  remdesivir 100 mg in sodium chloride 0.9 % 100 mL IVPB  Status:  Discontinued       See Hyperspace for full Linked Orders Report.   100 mg 200 mL/hr over 30 Minutes Intravenous Daily 11/01/21 0429 11/01/21 1355   11/01/21 1400  nirmatrelvir/ritonavir EUA (PAXLOVID) 3 tablet        3 tablet Oral 2 times daily 11/01/21 1355 11/06/21 0959   11/01/21 1345  levofloxacin (LEVAQUIN) tablet 500 mg        500 mg Oral Daily 11/01/21 1335 11/04/21 0959   11/01/21 0600  remdesivir 200 mg in sodium chloride 0.9% 250 mL IVPB       See Hyperspace for full Linked Orders Report.   200 mg 580 mL/hr over 30 Minutes Intravenous Once 11/01/21 0429 11/01/21 0724   11/01/21 0130  nirmatrelvir/ritonavir EUA (PAXLOVID) 3 tablet  Status:  Discontinued        3 tablet Oral 2 times daily 11/01/21 0047 11/01/21 0429        Time Spent in minutes  30   Christine Christensen M.D on 11/01/2021 at  1:56 PM  To page go to [email or web address]   [facility] -  Office  [phone]  See all Orders from today for further details    Objective:   Vitals:   11/01/21 1115 11/01/21 1130 11/01/21 1145 11/01/21 1200  BP: 120/66 121/69 132/67 121/83  Pulse: 86 67 87 78  Resp: 17 17 (!) 21 20  Temp:      TempSrc:      SpO2: 99% 98% 99% 99%    Wt Readings from Last 3 Encounters:  06/26/21 52.3 kg  07/27/18 73 kg  05/13/18 73.1 kg     Intake/Output Summary (Last 24 hours) at 11/01/2021 1356 Last data filed at 11/01/2021 1136 Gross per 24 hour  Intake 250 ml  Output 900 ml  Net -650 ml     Physical Exam  Awake Alert x 1, No new F.N deficits, chronic R.sided  weakness [place].AT,PERRAL Supple Neck, No JVD,   Symmetrical Chest wall movement, Good air movement bilaterally, CTAB RRR,No Gallops,Rubs or new Murmurs,  +ve B.Sounds, Abd Soft, No tenderness,   No Cyanosis, Clubbing or edema     RN pressure injury documentation: Pressure Injury 06/16/21 Buttocks Left Stage 2 -  Partial thickness loss of dermis presenting as a shallow open injury with a red, pink wound bed without slough. approx 38mm (Active)  06/16/21 0000  Location: Buttocks  Location Orientation: Left  Staging: Stage 2 -  Partial thickness loss of dermis presenting as a shallow open injury with a red, pink wound bed without slough.  Wound Description (Comments): approx 46mm  Present on Admission: No     Data Review:    CBC Recent Labs  Lab 10/31/21 2130  WBC 10.9*  HGB 12.2  HCT 36.7  PLT 188  MCV 88.4  MCH 29.4  MCHC 33.2  RDW 14.4  LYMPHSABS 0.9  MONOABS 1.2*  EOSABS 0.0  BASOSABS 0.0    Electrolytes Recent Labs  Lab 10/31/21 2130 11/01/21 0032 11/01/21 0657 11/01/21 0802  NA 138  --   --   --   K 3.7  --   --   --   CL 100  --   --   --   CO2 24  --   --   --   GLUCOSE 127*  --   --   --   BUN 10  --   --   --   CREATININE 0.79  --   --   --   CALCIUM 9.3  --   --   --   AST 22  --   --   --   ALT 14  --   --   --   ALKPHOS 50  --   --   --   BILITOT 0.3  --   --   --   ALBUMIN 3.5  --   --   --   CRP  --   --  8.0*  --   DDIMER  --   --  0.68*  --   PROCALCITON  --   --  <0.10  --   LATICACIDVEN 2.8* 2.5*  --  1.1  INR 1.1  --   --   --   BNP 1,428.6*  --   --   --     ------------------------------------------------------------------------------------------------------------------ No results for input(s): CHOL, HDL, LDLCALC, TRIG, CHOLHDL, LDLDIRECT in the last 72 hours.  No results found for: HGBA1C  No results for input(s): TSH, T4TOTAL, T3FREE, THYROIDAB  in the last 72 hours.  Invalid input(s):  FREET3 ------------------------------------------------------------------------------------------------------------------ ID Labs Recent Labs  Lab 10/31/21 2130 11/01/21 0032 11/01/21 0657 11/01/21 0802  WBC 10.9*  --   --   --   PLT 188  --   --   --   CRP  --   --  8.0*  --   DDIMER  --   --  0.68*  --   PROCALCITON  --   --  <0.10  --   LATICACIDVEN 2.8* 2.5*  --  1.1  CREATININE 0.79  --   --   --    Cardiac Enzymes No results for input(s): CKMB, TROPONINI, MYOGLOBIN in the last 168 hours.  Invalid input(s): CK  Radiology Reports CT Head Wo Contrast  Result Date: 10/31/2021 CLINICAL DATA:  Head trauma, minor (Age >= 65y).  Fall. EXAM: CT HEAD WITHOUT CONTRAST TECHNIQUE: Contiguous axial images were obtained from the base of the skull through the vertex without intravenous contrast. RADIATION DOSE REDUCTION: This exam was performed according to the departmental dose-optimization program which includes automated exposure control, adjustment of the mA and/or kV according to patient size and/or use of iterative reconstruction technique. COMPARISON:  06/25/2021 FINDINGS: Brain: There is atrophy and chronic small vessel disease changes. No acute intracranial abnormality. Specifically, no hemorrhage, hydrocephalus, mass lesion, acute infarction, or significant intracranial injury. Vascular: No hyperdense vessel or unexpected calcification. Skull: No acute calvarial abnormality. Sinuses/Orbits: No acute findings Other: None IMPRESSION: Atrophy, chronic microvascular disease. No acute intracranial abnormality. Electronically Signed   By: Rolm Baptise M.D.   On: 10/31/2021 22:17   DG Chest Port 1 View  Result Date: 10/31/2021 CLINICAL DATA:  Questionable sepsis EXAM: PORTABLE CHEST 1 VIEW COMPARISON:  06/12/2021 FINDINGS: Heart is normal size. Vascular congestion. Interstitial prominence. No focal confluent opacities or effusions. No acute bony abnormality. IMPRESSION: Mild vascular congestion  and interstitial prominence. Findings could reflect interstitial edema. Electronically Signed   By: Rolm Baptise M.D.   On: 10/31/2021 21:40

## 2021-11-01 NOTE — Assessment & Plan Note (Signed)
·   Continue home regimen of metoprolol  As needed intravenous antihypertensives for markedly elevated blood pressure

## 2021-11-01 NOTE — ED Notes (Signed)
P 

## 2021-11-01 NOTE — Assessment & Plan Note (Signed)
·   Patient presenting with complaints of shortness of breath, malaise, weakness and cough productive with frothy white sputum   COVID-19 PCR testing positive.  Chest x-ray revealing bilateral infiltrates at least in part associated with COVID-19 infection.   Airborne and contact isolation initiated.  Providing patient with supplemental oxygen for bouts of hypoxia with target oxygen saturation of 94%.  Patient has been initiated on a 5-day course of the remdesivir  Patient has been initiated on intravenous Solumedrol followed by Prednisone  As needed bronchodilator therapy for shortness of breath and wheezing   As needed antitussives for cough   Hydrating patient gently with intravenous isotonic fluids   Following CRP, D-dimer,  CBC daily  Blood cultures obtained  Close clinical monitoring

## 2021-11-01 NOTE — Assessment & Plan Note (Signed)
·   Notable lactic acidosis of 2.8 on arrival  Lactic acidosis likely secondary to transient hypoxia secondary to underlying COVID-19 and acute congestive heart failure  Providing patient with supplemental oxygen, treating underlying illness  Performing serial lactic acid levels to ensure downtrending and resolution

## 2021-11-01 NOTE — ED Notes (Signed)
Patient cleaned up with pericare. Patient had a full linen change and is currently resting.

## 2021-11-01 NOTE — ED Provider Notes (Signed)
°  Provider Note MRN:  [id number]  Arrival date & time: 11/01/21    ED Course and Medical Decision Making  Assumed care from Dr. Maryan Rued at shift change.  [place] out of bed at facility, febrile, O2 sats in the high 80s/low 90s.  COVID-positive.  Will ambulate with pulse ox, provide fluids and reassess.  Patient having intermittent hypoxia at rest down to 86%.  Was quite symptomatic with minimal ambulation.  Will admit to medicine.  Procedures  Final Clinical Impressions(s) / ED Diagnoses     ICD-10-CM   1. Fall, initial encounter  W19.Merril Abbe     2. COVID  U07.1       ED Discharge Orders     None       Discharge Instructions   None     Barth Kirks. [place] Small, [place] [email or web address]    Maudie Flakes, MD 11/01/21 [phone]

## 2021-11-01 NOTE — ED Notes (Signed)
Pt ambulated in the room with walker, SpO2 90-93% on room air. Pt c/o shortness of breath when returned to bed. EDP notified. Pt incontinent of urine, brief and bed linens changed.

## 2021-11-01 NOTE — Assessment & Plan Note (Signed)
·   Fall likely secondary to weakness due to underlying illness including both COVID-19 infection and likely acute congestive heart failure.  Treating underlying conditions as noted above  PT evaluation  Fall precautions

## 2021-11-02 ENCOUNTER — Inpatient Hospital Stay (HOSPITAL_COMMUNITY): Payer: Medicare Other

## 2021-11-02 LAB — CBC WITH DIFFERENTIAL/PLATELET
Abs Immature Granulocytes: 0.03 10*3/uL (ref 0.00–0.07)
Basophils Absolute: 0 10*3/uL (ref 0.0–0.1)
Basophils Relative: 0 %
Eosinophils Absolute: 0 10*3/uL (ref 0.0–0.5)
Eosinophils Relative: 0 %
HCT: 34.8 % — ABNORMAL LOW (ref 36.0–46.0)
Hemoglobin: 11.9 g/dL — ABNORMAL LOW (ref 12.0–15.0)
Immature Granulocytes: 1 %
Lymphocytes Relative: 14 %
Lymphs Abs: 0.9 10*3/uL (ref 0.7–4.0)
MCH: 29.5 pg (ref 26.0–34.0)
MCHC: 34.2 g/dL (ref 30.0–36.0)
MCV: 86.4 fL (ref 80.0–100.0)
Monocytes Absolute: 0.3 10*3/uL (ref 0.1–1.0)
Monocytes Relative: 5 %
Neutro Abs: 5.2 10*3/uL (ref 1.7–7.7)
Neutrophils Relative %: 80 %
Platelets: 160 10*3/uL (ref 150–400)
RBC: 4.03 MIL/uL (ref 3.87–5.11)
RDW: 14.7 % (ref 11.5–15.5)
WBC: 6.4 10*3/uL (ref 4.0–10.5)
nRBC: 0 % (ref 0.0–0.2)

## 2021-11-02 LAB — URINE CULTURE: Culture: 10000 — AB

## 2021-11-02 LAB — COMPREHENSIVE METABOLIC PANEL
ALT: 16 U/L (ref 0–44)
AST: 21 U/L (ref 15–41)
Albumin: 3 g/dL — ABNORMAL LOW (ref 3.5–5.0)
Alkaline Phosphatase: 49 U/L (ref 38–126)
Anion gap: 10 (ref 5–15)
BUN: 19 mg/dL (ref 8–23)
CO2: 25 mmol/L (ref 22–32)
Calcium: 8.9 mg/dL (ref 8.9–10.3)
Chloride: 105 mmol/L (ref 98–111)
Creatinine, Ser: 0.8 mg/dL (ref 0.44–1.00)
GFR, Estimated: >60 mL/min (ref >60)
Glucose, Bld: 128 mg/dL — ABNORMAL HIGH (ref 70–99)
Potassium: 4 mmol/L (ref 3.5–5.1)
Sodium: 140 mmol/L (ref 135–145)
Total Bilirubin: 0.4 mg/dL (ref 0.3–1.2)
Total Protein: 6.2 g/dL — ABNORMAL LOW (ref 6.5–8.1)

## 2021-11-02 LAB — C-REACTIVE PROTEIN: CRP: 9.1 mg/dL — ABNORMAL HIGH (ref <1.0)

## 2021-11-02 LAB — D-DIMER, QUANTITATIVE: D-Dimer, Quant: 0.65 ug/mL-FEU — ABNORMAL HIGH (ref 0.00–0.50)

## 2021-11-02 LAB — MAGNESIUM: Magnesium: 1.7 mg/dL (ref 1.7–2.4)

## 2021-11-02 LAB — BRAIN NATRIURETIC PEPTIDE: B Natriuretic Peptide: 870.8 pg/mL — ABNORMAL HIGH (ref 0.0–100.0)

## 2021-11-02 MED ORDER — METHYLPREDNISOLONE SODIUM SUCC 40 MG IJ SOLR
40.0000 mg | INTRAMUSCULAR | Status: DC
  Administered 2021-11-03 – 2021-11-04 (×2): 40 mg via INTRAVENOUS
  Filled 2021-11-02 (×2): qty 1

## 2021-11-02 MED ORDER — POTASSIUM CHLORIDE CRYS ER 20 MEQ PO TBCR
20.0000 meq | EXTENDED_RELEASE_TABLET | Freq: Once | ORAL | Status: AC
  Administered 2021-11-02: 20 meq via ORAL
  Filled 2021-11-02: qty 1

## 2021-11-02 MED ORDER — FUROSEMIDE 10 MG/ML IJ SOLN
40.0000 mg | Freq: Once | INTRAMUSCULAR | Status: AC
  Administered 2021-11-02: 40 mg via INTRAVENOUS
  Filled 2021-11-02: qty 4

## 2021-11-02 NOTE — NC FL2 (Signed)
[place] MEDICAID FL2 LEVEL OF CARE SCREENING TOOL     IDENTIFICATION  Patient Name: Christine Christensen Birthdate: 1946-05-23 Sex: female Admission Date (Current Location): 10/31/2021  [facility] and [place] Number:  Herbalist and Address:  [facility] [place], [place] [address], [place], [place] [address]      Provider Number: [id number]  Attending Physician Name and Address:  Thurnell Lose, MD  Relative Name and Phone Number:  Laelynn Blizzard (son) [phone]    Current Level of Care: Hospital Recommended Level of Care: [place] Prior Approval Number:    Date Approved/Denied:   PASRR Number: [id number]  Discharge Plan: SNF    Current Diagnoses: Patient Active Problem List   Diagnosis Date Noted   Sepsis due to COVID-19 [facility]) 11/01/2021   Lactic acidosis 11/01/2021   Acute on chronic diastolic CHF (congestive heart failure) ([place]) 11/01/2021   Essential hypertension 11/01/2021   Vascular dementia ([place]) 11/01/2021   Delirium due to another medical condition 06/22/2021   Dementia with behavioral disturbance    Pressure injury of skin 06/17/2021   Protein-calorie malnutrition, severe 06/13/2021   Closed right hip fracture, initial encounter ([place]) 06/12/2021   Hip fracture ([place]) 06/12/2021   Fall at home, initial encounter    Syncope and collapse 10/25/2017   S/P total knee replacement 08/06/2017    Orientation RESPIRATION BLADDER Height & Weight     Self, Situation, Place  Normal External catheter Weight: 133 lb 13.1 oz (60.7 kg) Height:  5\' 4"  (162.6 cm)  BEHAVIORAL SYMPTOMS/MOOD NEUROLOGICAL BOWEL NUTRITION STATUS      Incontinent Diet (See DC Summary)  AMBULATORY STATUS COMMUNICATION OF NEEDS Skin   Extensive Assist Verbally Normal                       Personal Care Assistance Level of Assistance  Bathing, Feeding, Dressing Bathing Assistance: Maximum assistance Feeding assistance: Limited assistance Dressing  Assistance: Maximum assistance     Functional Limitations Info  Hearing, Speech, Sight Sight Info: Adequate Hearing Info: Adequate Speech Info: Adequate    SPECIAL CARE FACTORS FREQUENCY                       Contractures Contractures Info: Not present    Additional Factors Info  Code Status, Allergies, Psychotropic., Isolation Precaution Code Status Info: DNR Allergies Info: Penicillins Psychotropic Info: QUEtiapine (SEROQUEL) tablet 12.5 mg  Isolation Precaution: COVID+       Current Medications (11/02/2021):  This is the current hospital active medication list Current Facility-Administered Medications  Medication Dose Route Frequency Provider Last Rate Last Admin   acetaminophen (TYLENOL) tablet 650 mg  650 mg Oral Q6H PRN Shalhoub, Sherryll Burger, MD       Or   acetaminophen (TYLENOL) suppository 650 mg  650 mg Rectal Q6H PRN Shalhoub, Sherryll Burger, MD       albuterol (VENTOLIN HFA) 108 (90 Base) MCG/ACT inhaler 2 puff  2 puff Inhalation Q4H PRN Shalhoub, Sherryll Burger, MD       ascorbic acid (VITAMIN C) tablet 500 mg  500 mg Oral Daily Shalhoub, Sherryll Burger, MD   500 mg at 11/02/21 1041   aspirin tablet 325 mg  325 mg Oral Daily Shalhoub, Sherryll Burger, MD   325 mg at 11/02/21 1041   enoxaparin (LOVENOX) injection 40 mg  40 mg Subcutaneous Q24H Vernelle Emerald, MD   40 mg at 11/02/21 1041  escitalopram (LEXAPRO) tablet 20 mg  20 mg Oral Daily Shalhoub, Sherryll Burger, MD   20 mg at 11/02/21 1041   guaiFENesin-dextromethorphan (ROBITUSSIN DM) 100-10 MG/5ML syrup 10 mL  10 mL Oral Q4H PRN Shalhoub, Sherryll Burger, MD       hydrALAZINE (APRESOLINE) injection 10 mg  10 mg Intravenous Q6H PRN Shalhoub, Sherryll Burger, MD       levofloxacin (LEVAQUIN) tablet 500 mg  500 mg Oral Daily Lala Lund K, MD   500 mg at 11/02/21 1041   melatonin tablet 3 mg  3 mg Oral QHS Shalhoub, Sherryll Burger, MD   3 mg at 11/01/21 2346   [START ON 11/03/2021] methylPREDNISolone sodium succinate (SOLU-MEDROL) 40 mg/mL injection 40  mg  40 mg Intravenous Q24H Thurnell Lose, MD       metoprolol tartrate (LOPRESSOR) tablet 12.5 mg  12.5 mg Oral BID Vernelle Emerald, MD   12.5 mg at 11/02/21 1041   nirmatrelvir/ritonavir EUA (PAXLOVID) 3 tablet  3 tablet Oral BID Thurnell Lose, MD   3 tablet at 11/02/21 1042   ondansetron (ZOFRAN) injection 4 mg  4 mg Intravenous Q6H PRN Shalhoub, Sherryll Burger, MD       polyethylene glycol (MIRALAX / GLYCOLAX) packet 17 g  17 g Oral Daily PRN Shalhoub, Sherryll Burger, MD       QUEtiapine (SEROQUEL) tablet 12.5 mg  12.5 mg Oral Daily PRN Shalhoub, Sherryll Burger, MD       senna-docusate (Senokot-S) tablet 1 tablet  1 tablet Oral BID Vernelle Emerald, MD   1 tablet at 11/02/21 1041   zinc sulfate capsule 220 mg  220 mg Oral Daily Shalhoub, Sherryll Burger, MD   220 mg at 11/02/21 1041     Discharge Medications: Please see discharge summary for a list of discharge medications.  Relevant Imaging Results:  Relevant Lab Results:   Additional Information SS#:[id number]; [place] COVID-19 Vaccine 06/22/2020  Benard Halsted, LCSW

## 2021-11-02 NOTE — Evaluation (Signed)
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 Pt reports she was able to walk short distances and uses a wc at times; Able to remember that she has been in rehab at [facility], and able to report she has difficulty with her RLE; otherwise, unclear historian; Presents to PT with generalized weakness, decr functional endurance and cognitive deficits, leading to functional dependencies; Recommend return to sNF for continued rehab; Pt currently with functional limitations due to the deficits listed below (see PT Problem List). Pt will benefit from skilled PT to increase their independence and safety with mobility to allow discharge to the venue listed below.          Recommendations for follow up therapy are one component of a multi-disciplinary discharge planning process, led by the attending physician.  Recommendations may be updated based on patient status, additional functional criteria and insurance authorization.  Follow Up Recommendations Skilled nursing-short term rehab (<3 hours/day)    Assistance Recommended at Discharge Frequent or constant Supervision/Assistance  Patient can return home with the following  A little help with walking and/or transfers;Assistance with cooking/housework;Assist for transportation;Help with stairs or ramp for entrance    Equipment  Recommendations Rolling walker (2 wheels);BSC/3in1  Recommendations for Other Services       Functional Status Assessment Patient has had a recent decline in their functional status and demonstrates the ability to make significant improvements in function in a reasonable and predictable amount of time.     Precautions / Restrictions Precautions Precautions: Fall Restrictions Weight Bearing Restrictions: No      Mobility  Bed Mobility Overal bed mobility: Needs Assistance Bed Mobility: Rolling, Sidelying to Sit Rolling: Min assist Sidelying to sit: Min assist       General bed mobility comments: cues for technique; min assist to sit up    Transfers Overall transfer level: Needs assistance Equipment used: Rolling walker (2 wheels) Transfers: Sit to/from Stand, Bed to chair/wheelchair/BSC Sit to Stand: Mod assist, Min assist   Step pivot transfers: Min assist       General transfer comment: mod A initial sit - stand from bed, verbal cues for hand placement; good use of RW for stability during pivotal steps bed to chair    Ambulation/Gait                  Stairs            Wheelchair Mobility    Modified Rankin (Stroke Patients Only)       Balance     Sitting balance-Leahy Scale: Fair     Standing balance support: Bilateral upper extremity supported, During functional activity Standing balance-Leahy Scale: Poor                               Pertinent Vitals/Pain Pain Assessment Pain Assessment: Faces Faces Pain Scale: Hurts a little bit Pain Location: L knee and lower leg Pain Descriptors / Indicators: Grimacing Pain  Intervention(s): Monitored during session    Home Living Family/patient expects to be discharged to:: Skilled nursing facility Living Arrangements: Children Available Help at Discharge: Family;Available PRN/intermittently Type of Home: House Home Access: Ramped entrance       Home Layout: One level Home  Equipment: Conservation officer, nature (2 wheels);Wheelchair - manual;Shower seat Additional Comments: Pt poor historian, home set-up info taken from prior admission. Pt does state that she uses a w/c. Pt has been at [facility] home for several months since last hospitalization approximatlely 4 months ago    Prior Function               Mobility Comments: pt reports that she can pivot to transer to w/c and toilet ADLs Comments: Pt reports that she was Ind with UB ADLs, grooming and toileting. requires assist with bathing and LB ADLs     Hand Dominance   Dominant Hand: Right    Extremity/Trunk Assessment   Upper Extremity Assessment Upper Extremity Assessment: Defer to OT evaluation    Lower Extremity Assessment Lower Extremity Assessment: Generalized weakness       Communication   Communication: HOH  Cognition Arousal/Alertness: Awake/alert Behavior During Therapy: WFL for tasks assessed/performed Overall Cognitive Status: No family/caregiver present to determine baseline cognitive functioning Area of Impairment: Memory, Orientation, Following commands, Safety/judgement                     Memory: Decreased short-term memory Following Commands: Follows multi-step commands with increased time Safety/Judgement: Decreased awareness of deficits              General Comments General comments (skin integrity, edema, etc.): Session conducted on room Air, O2 sats remained greater than or equal to 92% throughout session    Exercises     Assessment/Plan    PT Assessment Patient needs continued PT services  PT Problem List Decreased strength;Decreased activity tolerance;Decreased balance;Decreased mobility;Decreased coordination;Decreased cognition;Decreased knowledge of use of DME;Decreased safety awareness;Decreased knowledge of precautions       PT Treatment Interventions DME instruction;Gait training;Functional mobility training;Therapeutic activities;Therapeutic  exercise;Balance training;Cognitive remediation;Patient/family education    PT Goals (Current goals can be found in the Care Plan section)  Acute Rehab PT Goals Patient Stated Goal: Did not state, but agreeable to getting OOB PT Goal Formulation: Patient unable to participate in goal setting Time For Goal Achievement: 11/16/21 Potential to Achieve Goals: Good    Frequency Min 2X/week     Co-evaluation               AM-PAC PT "6 Clicks" Mobility  Outcome Measure Help needed turning from your back to your side while in a flat bed without using bedrails?: A Little Help needed moving from lying on your back to sitting on the side of a flat bed without using bedrails?: A Little Help needed moving to and from a bed to a chair (including a wheelchair)?: A Lot Help needed standing up from a chair using your arms (e.g., wheelchair or bedside chair)?: A Lot Help needed to walk in hospital room?: A Lot Help needed climbing 3-5 steps with a railing? : Total 6 Click Score: 13    End of Session Equipment Utilized During Treatment: Gait belt Activity Tolerance: Patient tolerated treatment well Patient left: in chair;with call bell/phone within reach;with chair alarm set Nurse Communication: Mobility status PT Visit Diagnosis: Unsteadiness on feet (R26.81);Muscle weakness (generalized) (M62.81)    Time: 3976-7341 PT Time Calculation (min) (ACUTE ONLY): 35 min  Charges:   PT Evaluation $PT Eval Moderate Complexity: 1 Mod PT Treatments $Therapeutic Activity: 8-22 mins        Roney Marion, PT  Acute Rehabilitation Services Pager [phone] Office [phone]   [place] 11/02/2021, 4:13 PM

## 2021-11-02 NOTE — Evaluation (Signed)
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 Pt is a poor historian, uncertain of accuracy of info regarding PLOF by pt. Pt admitted from a SNF and reports that sh required assist with LB ADLs, toileting and transfers; reports that she used a w/c and was able to pivot with staff assist. Pt currently requires max A with LB ADLs, min guard A with UB ADLs, min A with simple grooming standing with RW and mod - min A for sit - stand/transfers using RW. Pt would benefit from acute OT services to address impairments to maximize level of function and safety      Recommendations for follow up therapy are one component of a multi-disciplinary discharge planning process, led by the attending physician.  Recommendations may be updated based on patient status, additional functional criteria and insurance authorization.   Follow Up Recommendations  Skilled nursing-short term rehab (<3 hours/day)    Assistance Recommended at Discharge Frequent or constant Supervision/Assistance  Patient can return home with the following A lot of help with bathing/dressing/bathroom;A lot of help with walking and/or transfers;Direct supervision/assist for  medications management;Direct supervision/assist for financial management;Assist for transportation    Functional Status Assessment  Patient has had a recent decline in their functional status and demonstrates the ability to make significant improvements in function in a reasonable and predictable amount of time.  Equipment Recommendations  None recommended by OT    Recommendations for Other Services       Precautions / Restrictions Precautions Precautions: Fall Restrictions Weight Bearing Restrictions: No      Mobility Bed Mobility               General bed mobility comments: pt in recliner upon arrival    Transfers Overall transfer level: Needs assistance Equipment used: Rolling walker (2 wheels) Transfers: Sit to/from Stand, Bed to chair/wheelchair/BSC Sit to Stand: Mod assist, Min assist Stand pivot transfers: Min assist         General transfer comment: mod A initial sit - stand from recliner, verbal cues for hand placement      Balance Overall balance assessment: Needs assistance Sitting-balance support: No upper extremity supported, Feet supported Sitting balance-Leahy Scale: Fair     Standing balance support: Bilateral upper extremity supported, During functional activity Standing balance-Leahy Scale: Poor                             ADL either performed or assessed with clinical judgement   ADL Overall ADL's : Needs assistance/impaired Eating/Feeding: Set up;Independent;Sitting   Grooming: Wash/dry hands;Wash/dry face;Minimal assistance;Standing   Upper Body Bathing: Min guard;Sitting   Lower Body Bathing: Maximal assistance   Upper Body Dressing : Min guard;Sitting   Lower Body Dressing: Maximal assistance   Toilet Transfer: Moderate assistance;Minimal assistance;Rolling walker (2 wheels);Stand-pivot;BSC/3in1;Cueing for  safety;Cueing for sequencing   Toileting- Clothing Manipulation and Hygiene: Maximal assistance;Sit to/from  stand       Functional mobility during ADLs: Moderate assistance;Minimal assistance;Cueing for safety;Rolling walker (2 wheels);Cueing for sequencing General ADL Comments: max A at baseline with LB selfcare per pt report     Vision Baseline Vision/History: 1 Wears glasses Ability to See in Adequate Light: 0 Adequate Patient Visual Report: No change from baseline       Perception     Praxis      Pertinent Vitals/Pain Pain Assessment Pain Assessment: No/denies pain Faces Pain Scale: No hurt     Hand Dominance Right   Extremity/Trunk Assessment Upper Extremity Assessment Upper Extremity Assessment: Generalized weakness   Lower Extremity Assessment Lower Extremity Assessment: Defer to PT evaluation       Communication Communication Communication: HOH   Cognition Arousal/Alertness: Awake/alert   Overall Cognitive Status: No family/caregiver present to determine baseline cognitive functioning Area of Impairment: Memory, Orientation, Following commands, Safety/judgement                     Memory: Decreased short-term memory Following Commands: Follows multi-step commands with increased time Safety/Judgement: Decreased awareness of deficits           General Comments       Exercises     Shoulder Instructions      Home Living Family/patient expects to be discharged to:: Skilled nursing facility Living Arrangements: Children Available Help at Discharge: Family;Available PRN/intermittently Type of Home: House Home Access: Ramped entrance     Home Layout: One level     Bathroom Shower/Tub: Teacher, early years/pre: Standard     Home Equipment: Conservation officer, nature (2 wheels);Wheelchair - manual;Shower seat   Additional Comments: Pt poor historian, home set-up info taken from prior admission. Pt does state that she uses a w/c. Pt has been at [facility] home for several months since last hospitalization approximatlely 4 months ago       Prior Functioning/Environment               Mobility Comments: pt reports that she can pivot to transeft to w/c and toilet ADLs Comments: Pt reports that she was Ind with UB ADLs, grooming and toileting. requires assist with bathing and LB ADLs        OT Problem List: Decreased strength;Impaired balance (sitting and/or standing);Decreased cognition;Decreased safety awareness;Decreased activity tolerance;Decreased knowledge of use of DME or AE      OT Treatment/Interventions: Self-care/ADL training;Patient/family education;Balance training;Therapeutic activities;DME and/or AE instruction    OT Goals(Current goals can be found in the care plan section) Acute Rehab OT Goals Patient Stated Goal: get well OT Goal Formulation: With patient Time For Goal Achievement: 11/16/21 Potential to Achieve Goals: Good ADL Goals Pt Will Perform Grooming: with min guard assist;standing Pt Will Perform Upper Body Bathing: with supervision;with set-up;sitting Pt Will Perform Upper Body Dressing: with supervision;with set-up;sitting Pt Will Transfer to Toilet: with min assist;with min guard assist;stand pivot transfer;bedside commode Pt Will Perform Toileting - Clothing Manipulation and hygiene: with mod assist;with min assist;sit to/from stand  OT Frequency: Min 2X/week    Co-evaluation              AM-PAC OT "6 Clicks" Daily Activity     Outcome Measure Help from another person eating meals?: None Help from another person taking care of personal grooming?: A Little Help from another person toileting, which includes using toliet, bedpan, or urinal?: A Lot  Help from another person bathing (including washing, rinsing, drying)?: A Lot   Help from another person to put on and taking off regular lower body clothing?: A Lot 6 Click Score: 13   End of Session Equipment Utilized During Treatment: Gait belt;Rolling walker (2 wheels);Other (comment) (BSC)  Activity Tolerance: Patient  tolerated treatment well Patient left: in chair;with call bell/phone within reach;with chair alarm set  OT Visit Diagnosis: Unsteadiness on feet (R26.81);History of falling (Z91.81);Muscle weakness (generalized) (M62.81);Other symptoms and signs involving cognitive function;Other abnormalities of gait and mobility (R26.89)                Time: 1456-1520 OT Time Calculation (min): 24 min Charges:  OT General Charges $OT Visit: 1 Visit OT Evaluation $OT Eval Moderate Complexity: 1 Mod OT Treatments $Therapeutic Activity: 8-22 mins    Britt Bottom 11/02/2021, 3:54 PM

## 2021-11-02 NOTE — Progress Notes (Signed)
PROGRESS NOTE                                                                                                                                                                                                             Patient Demographics:    Christine Christensen, is a 76 y.o. female, DOB - 02/19/1946, QIW:[id number]  Outpatient Primary MD for the patient is Nolene Ebbs, MD    LOS - 1  Admit date - 10/31/2021    Chief Complaint  Patient presents with   Fall       Brief Narrative (HPI from H&P)   - Patient admitted few hours ago for fall at home due to generalized weakness and deconditioning, with underlying sepsis due to COVID-19 infection along with acute on chronic CHF.  Is been appropriately treated with IV steroids, antivirals started in the ER will complete 3 days, has a productive cough with yellowish color hence will add Levaquin for 3 days as well.  Trial of Lasix due to elevated proBNP and some crackles on exam.  Monitor closely.  At risk for delirium due to underlying dementia.  According to the son lately she has had multiple falls at the nursing home and seems to be rapidly declining in terms of her dementia and neurological capacity.   Subjective:   Patient in bed, appears comfortable, denies any headache, no fever, no chest pain or pressure, improved cough & shortness of breath , no abdominal pain. No new focal weakness.   Assessment  & Plan :    Acute Hypoxic Resp. Failure due to Acute Covid 19 Viral Pneumonitis + Acute on Chr.Diastolic CHF EF 41% -  due to COVID-19 infection along with acute on chronic CHF.  Is been appropriately treated with IV steroids, will place on Paxlovid as her decline seems to be acute over the last few days per son, she also has a productive cough with yellowish color hence will added Levaquin for 3 days as well.  Responding well to Lasix, continue on 11/02/20. Note she has had at least 2  Covid 19 vaccines thus far per her son.  Sepsis due to COVID-19 pneumonia seems to have resolved.  Encouraged the patient to sit up in chair in the daytime use I-S and flutter valve for pulmonary toiletry.  Will advance activity and titrate down oxygen as  possible.  SpO2: 94 % O2 Flow Rate (L/min): 2 L/min   2. Underlying Dementia - at risk for delirium, supportive care.  3.  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  Supportive care, diurese with IV lasix, outpatient cardiology follow-up.  Continue beta-blocker.  4.  Multiple falls at SNF and failing memory.  Supportive care.  PT OT.   5.  Hypertension.  On beta-blocker continue.       Condition - Extremely Guarded  Family Communication  :  son [name] [phone] on 11/01/20 >> DNR, 11/02/20  Code Status :  DNR  Consults  :  None  PUD Prophylaxis :    Procedures  :     CT Head -  Atrophy, chronic microvascular disease. No acute intracranial abnormality.      Disposition Plan  :    Status is: Inpatient  DVT Prophylaxis  :    enoxaparin (LOVENOX) injection 40 mg Start: 11/01/21 1000  Lab Results  Component Value Date   PLT 160 11/02/2021    Diet :  Diet Order             Diet Heart Room service appropriate? Yes; Fluid consistency: Thin  Diet effective now                    Inpatient Medications  Scheduled Meds:  vitamin C  500 mg Oral Daily   aspirin  325 mg Oral Daily   enoxaparin (LOVENOX) injection  40 mg Subcutaneous Q24H   escitalopram  20 mg Oral Daily   levofloxacin  500 mg Oral Daily   melatonin  3 mg Oral QHS   methylPREDNISolone (SOLU-MEDROL) injection  80 mg Intravenous Q24H   metoprolol tartrate  12.5 mg Oral BID   nirmatrelvir/ritonavir EUA  3 tablet Oral BID   senna-docusate  1 tablet Oral BID   zinc sulfate  220 mg Oral Daily   Continuous Infusions:   PRN Meds:.acetaminophen **OR** acetaminophen,  albuterol, guaiFENesin-dextromethorphan, hydrALAZINE, [DISCONTINUED] ondansetron **OR** ondansetron (ZOFRAN) IV, polyethylene glycol, QUEtiapine  Antibiotics  :    Anti-infectives (From admission, onward)    Start     Dose/Rate Route Frequency Ordered Stop   11/02/21 1000  remdesivir 100 mg in sodium chloride 0.9 % 100 mL IVPB  Status:  Discontinued       See Hyperspace for full Linked Orders Report.   100 mg 200 mL/hr over 30 Minutes Intravenous Daily 11/01/21 0429 11/01/21 1355   11/01/21 2200  nirmatrelvir/ritonavir EUA (PAXLOVID) 3 tablet        3 tablet Oral 2 times daily 11/01/21 1355 11/06/21 2159   11/01/21 1345  levofloxacin (LEVAQUIN) tablet 500 mg        500 mg Oral Daily 11/01/21 1335 11/04/21 0959   11/01/21 0600  remdesivir 200 mg in sodium chloride 0.9% 250 mL IVPB       See Hyperspace for full Linked Orders Report.   200 mg 580 mL/hr over 30 Minutes Intravenous Once 11/01/21 0429 11/01/21 0724   11/01/21 0130  nirmatrelvir/ritonavir EUA (PAXLOVID) 3 tablet  Status:  Discontinued        3 tablet Oral 2 times daily 11/01/21 0047 11/01/21 0429        Time Spent in minutes  30   Lala Lund M.D on 11/02/2021 at 10:16 AM  To page go to [email or web address]   [facility] -  Office  [phone]  See  all Orders from today for further details    Objective:   Vitals:   11/01/21 2127 11/01/21 2235 11/01/21 2240 11/02/21 0826  BP: (!) 98/56 107/62 114/76 120/71  Pulse: 94 83 90 60  Resp: 18 15 18 20   Temp:   98.9 F (37.2 C) 98.4 F (36.9 C)  TempSrc:   Oral Axillary  SpO2: 97% 95% 96% 94%  Weight:   60.7 kg   Height:   5\' 4"  (1.626 m)     Wt Readings from Last 3 Encounters:  11/01/21 60.7 kg  06/26/21 52.3 kg  07/27/18 73 kg     Intake/Output Summary (Last 24 hours) at 11/02/2021 1016 Last data filed at 11/01/2021 1136 Gross per 24 hour  Intake --  Output 900 ml  Net -900 ml     Physical Exam  Awake Alert x 1, No new F.N deficits, chronic  R.sided weakness Lucas.AT,PERRAL Supple Neck, No JVD,   Symmetrical Chest wall movement, Good air movement bilaterally, few rales RRR,No Gallops, Rubs or new Murmurs,  +ve B.Sounds, Abd Soft, No tenderness,   No Cyanosis, Clubbing or edema    RN pressure injury documentation: Pressure Injury 06/16/21 Buttocks Left Stage 2 -  Partial thickness loss of dermis presenting as a shallow open injury with a red, pink wound bed without slough. approx 62mm (Active)  06/16/21 0000  Location: Buttocks  Location Orientation: Left  Staging: Stage 2 -  Partial thickness loss of dermis presenting as a shallow open injury with a red, pink wound bed without slough.  Wound Description (Comments): approx 62mm  Present on Admission: No     Data Review:    CBC Recent Labs  Lab 10/31/21 2130 11/02/21 0251  WBC 10.9* 6.4  HGB 12.2 11.9*  HCT 36.7 34.8*  PLT 188 160  MCV 88.4 86.4  MCH 29.4 29.5  MCHC 33.2 34.2  RDW 14.4 14.7  LYMPHSABS 0.9 0.9  MONOABS 1.2* 0.3  EOSABS 0.0 0.0  BASOSABS 0.0 0.0    Electrolytes Recent Labs  Lab 10/31/21 2130 11/01/21 0032 11/01/21 0657 11/01/21 0802 11/02/21 0251  NA 138  --   --   --  140  K 3.7  --   --   --  4.0  CL 100  --   --   --  105  CO2 24  --   --   --  25  GLUCOSE 127*  --   --   --  128*  BUN 10  --   --   --  19  CREATININE 0.79  --   --   --  0.80  CALCIUM 9.3  --   --   --  8.9  AST 22  --   --   --  21  ALT 14  --   --   --  16  ALKPHOS 50  --   --   --  49  BILITOT 0.3  --   --   --  0.4  ALBUMIN 3.5  --   --   --  3.0*  MG  --   --   --   --  1.7  CRP  --   --  8.0*  --  9.1*  DDIMER  --   --  0.68*  --  0.65*  PROCALCITON  --   --  <0.10  --   --   LATICACIDVEN 2.8* 2.5*  --  1.1  --   INR 1.1  --   --   --   --  BNP 1,428.6*  --   --   --  870.8*    ------------------------------------------------------------------------------------------------------------------ No results for input(s): CHOL, HDL, LDLCALC, TRIG,  CHOLHDL, LDLDIRECT in the last 72 hours.  No results found for: HGBA1C  No results for input(s): TSH, T4TOTAL, T3FREE, THYROIDAB in the last 72 hours.  Invalid input(s): FREET3 ------------------------------------------------------------------------------------------------------------------ ID Labs Recent Labs  Lab 10/31/21 2130 11/01/21 0032 11/01/21 0657 11/01/21 0802 11/02/21 0251  WBC 10.9*  --   --   --  6.4  PLT 188  --   --   --  160  CRP  --   --  8.0*  --  9.1*  DDIMER  --   --  0.68*  --  0.65*  PROCALCITON  --   --  <0.10  --   --   LATICACIDVEN 2.8* 2.5*  --  1.1  --   CREATININE 0.79  --   --   --  0.80   Cardiac Enzymes No results for input(s): CKMB, TROPONINI, MYOGLOBIN in the last 168 hours.  Invalid input(s): CK  Radiology Reports CT Head Wo Contrast  Result Date: 10/31/2021 CLINICAL DATA:  Head trauma, minor (Age >= 65y).  Fall. EXAM: CT HEAD WITHOUT CONTRAST TECHNIQUE: Contiguous axial images were obtained from the base of the skull through the vertex without intravenous contrast. RADIATION DOSE REDUCTION: This exam was performed according to the departmental dose-optimization program which includes automated exposure control, adjustment of the mA and/or kV according to patient size and/or use of iterative reconstruction technique. COMPARISON:  06/25/2021 FINDINGS: Brain: There is atrophy and chronic small vessel disease changes. No acute intracranial abnormality. Specifically, no hemorrhage, hydrocephalus, mass lesion, acute infarction, or significant intracranial injury. Vascular: No hyperdense vessel or unexpected calcification. Skull: No acute calvarial abnormality. Sinuses/Orbits: No acute findings Other: None IMPRESSION: Atrophy, chronic microvascular disease. No acute intracranial abnormality. Electronically Signed   By: Rolm Baptise M.D.   On: 10/31/2021 22:17   DG Chest Port 1 View  Result Date: 10/31/2021 CLINICAL DATA:  Questionable sepsis EXAM:  PORTABLE CHEST 1 VIEW COMPARISON:  06/12/2021 FINDINGS: Heart is normal size. Vascular congestion. Interstitial prominence. No focal confluent opacities or effusions. No acute bony abnormality. IMPRESSION: Mild vascular congestion and interstitial prominence. Findings could reflect interstitial edema. Electronically Signed   By: Rolm Baptise M.D.   On: 10/31/2021 21:40

## 2021-11-03 LAB — CBC WITH DIFFERENTIAL/PLATELET
Abs Immature Granulocytes: 0.05 10*3/uL (ref 0.00–0.07)
Basophils Absolute: 0 10*3/uL (ref 0.0–0.1)
Basophils Relative: 0 %
Eosinophils Absolute: 0 10*3/uL (ref 0.0–0.5)
Eosinophils Relative: 0 %
HCT: 34.5 % — ABNORMAL LOW (ref 36.0–46.0)
Hemoglobin: 11.3 g/dL — ABNORMAL LOW (ref 12.0–15.0)
Immature Granulocytes: 1 %
Lymphocytes Relative: 8 %
Lymphs Abs: 0.9 10*3/uL (ref 0.7–4.0)
MCH: 29 pg (ref 26.0–34.0)
MCHC: 32.8 g/dL (ref 30.0–36.0)
MCV: 88.7 fL (ref 80.0–100.0)
Monocytes Absolute: 0.3 10*3/uL (ref 0.1–1.0)
Monocytes Relative: 3 %
Neutro Abs: 9.5 10*3/uL — ABNORMAL HIGH (ref 1.7–7.7)
Neutrophils Relative %: 88 %
Platelets: 144 10*3/uL — ABNORMAL LOW (ref 150–400)
RBC: 3.89 MIL/uL (ref 3.87–5.11)
RDW: 14.8 % (ref 11.5–15.5)
WBC: 10.8 10*3/uL — ABNORMAL HIGH (ref 4.0–10.5)
nRBC: 0 % (ref 0.0–0.2)

## 2021-11-03 LAB — COMPREHENSIVE METABOLIC PANEL
ALT: 15 U/L (ref 0–44)
AST: 21 U/L (ref 15–41)
Albumin: 3 g/dL — ABNORMAL LOW (ref 3.5–5.0)
Alkaline Phosphatase: 39 U/L (ref 38–126)
Anion gap: 11 (ref 5–15)
BUN: 27 mg/dL — ABNORMAL HIGH (ref 8–23)
CO2: 25 mmol/L (ref 22–32)
Calcium: 8.8 mg/dL — ABNORMAL LOW (ref 8.9–10.3)
Chloride: 101 mmol/L (ref 98–111)
Creatinine, Ser: 1.06 mg/dL — ABNORMAL HIGH (ref 0.44–1.00)
GFR, Estimated: 54 mL/min — ABNORMAL LOW (ref >60)
Glucose, Bld: 121 mg/dL — ABNORMAL HIGH (ref 70–99)
Potassium: 4.1 mmol/L (ref 3.5–5.1)
Sodium: 137 mmol/L (ref 135–145)
Total Bilirubin: 0.2 mg/dL — ABNORMAL LOW (ref 0.3–1.2)
Total Protein: 6.1 g/dL — ABNORMAL LOW (ref 6.5–8.1)

## 2021-11-03 LAB — D-DIMER, QUANTITATIVE: D-Dimer, Quant: 0.38 ug/mL-FEU (ref 0.00–0.50)

## 2021-11-03 LAB — MAGNESIUM: Magnesium: 1.8 mg/dL (ref 1.7–2.4)

## 2021-11-03 LAB — C-REACTIVE PROTEIN: CRP: 3.3 mg/dL — ABNORMAL HIGH (ref <1.0)

## 2021-11-03 LAB — BRAIN NATRIURETIC PEPTIDE: B Natriuretic Peptide: 383.6 pg/mL — ABNORMAL HIGH (ref 0.0–100.0)

## 2021-11-03 NOTE — Progress Notes (Signed)
SATURATION QUALIFICATIONS: (This note is used to comply with regulatory documentation for home oxygen)  Patient Saturations on Room Air at Rest = 94%  Patient Saturations on Room Air while Ambulating = 91%  Patient Saturations on 0 Liters of oxygen while Ambulating = 92%

## 2021-11-03 NOTE — TOC Initial Note (Addendum)
Transition of Care [facility]) - Initial/Assessment Note    Patient Details  Name: Christine Christensen MRN: [id number] Date of Birth: January 09, 1946  [facility] CM/SW Contact:    Benard Halsted, LCSW Phone Number: 11/03/2021, 1:59 PM  Clinical Narrative:                 CSW spoke with patient's son regarding plan to discharge patient back to [facility] tomorrow. He reported understanding that patient has to return there due to COVID + status. He stated has completed POA paperwork for patient in order to begin Medicaid process. He will come to the hospital tomorrow to pick up paperwork that CSW will leave on patient's hard chart for him as his email is not working currently. Patient will transport via [facility]. CSW left message for admissions at [facility] to make them aware. CSW submitted clinicals to insurance for rehab review, Ref# [id number].   Expected Discharge Plan: Skilled Nursing Facility Barriers to Discharge: Continued Medical Work up   Patient Goals and CMS Choice Patient states their goals for this hospitalization and ongoing recovery are:: Feel better CMS Medicare.gov Compare Post Acute Care list provided to:: Patient Represenative (must comment) Choice offered to / list presented to : Adult Children  Expected Discharge Plan and Services Expected Discharge Plan: [place] In-house Referral: Clinical Social Work   Post Acute Care Choice: [place] Living arrangements for the past 2 months: [facility]                                      Prior Living Arrangements/Services Living arrangements for the past 2 months: [facility] Lives with:: Facility Resident Patient language and need for interpreter reviewed:: Yes Do you feel safe going back to the place where you live?: Yes      Need for Family Participation in Patient Care: Yes (Comment) Care giver support system in place?: Yes (comment)    Criminal Activity/Legal Involvement Pertinent to Current Situation/Hospitalization: No - Comment as needed  Activities of Daily Living Home Assistive Devices/Equipment: Wheelchair, Environmental consultant (specify type) ADL Screening (condition at time of admission) Patient's cognitive ability adequate to safely complete daily activities?: No Is the patient deaf or have difficulty hearing?: No Does the patient have difficulty seeing, even when wearing glasses/contacts?: No Does the patient have difficulty concentrating, remembering, or making decisions?: Yes Patient able to express need for assistance with ADLs?: Yes Does the patient have difficulty dressing or bathing?: No Independently performs ADLs?: Yes (appropriate for developmental age) Does the patient have difficulty walking or climbing stairs?: Yes Weakness of Legs: Both Weakness of Arms/Hands: None  Permission Sought/Granted Permission sought to share information with : Facility Sport and exercise psychologist, Family Supports Permission granted to share information with : No  Share Information with NAME: Christine Christensen  Permission granted to share info w AGENCY: [facility]  Permission granted to share info w Relationship: Son  Permission granted to share info w Contact Information: [phone]  Emotional Assessment Appearance:: Appears stated age Attitude/Demeanor/Rapport: Unable to Assess Affect (typically observed): Unable to Assess Orientation: : Oriented to Self, Oriented to Place, Oriented to Situation Alcohol / Substance Use: Not Applicable Psych Involvement: No (comment)  Admission diagnosis:  Fall, initial encounter [W19.XXXA] COVID [U07.1] COVID-19 virus infection [U07.1] Patient Active Problem List   Diagnosis Date Noted   Sepsis due to COVID-19 ([place]) 11/01/2021   Lactic acidosis  11/01/2021   Acute on chronic diastolic CHF (congestive heart failure) ([place]) 11/01/2021   Essential hypertension 11/01/2021   Vascular dementia ([place])  11/01/2021   Delirium due to another medical condition 06/22/2021   Dementia with behavioral disturbance    Pressure injury of skin 06/17/2021   Protein-calorie malnutrition, severe 06/13/2021   Closed right hip fracture, initial encounter ([place]) 06/12/2021   Hip fracture ([place]) 06/12/2021   Fall at home, initial encounter    Syncope and collapse 10/25/2017   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

## 2021-11-03 NOTE — Progress Notes (Signed)
PROGRESS NOTE                                                                                                                                                                                                             Patient Demographics:    Christine Christensen, is a 76 y.o. female, DOB - 11-16-1945, BDZ:[id number]  Outpatient Primary MD for the patient is Nolene Ebbs, MD    LOS - 2  Admit date - 10/31/2021    Chief Complaint  Patient presents with   Fall       Brief Narrative (HPI from H&P)   - Patient admitted few hours ago for fall at home due to generalized weakness and deconditioning, with underlying sepsis due to COVID-19 infection along with acute on chronic CHF.  Is been appropriately treated with IV steroids, antivirals started in the ER will complete 3 days, has a productive cough with yellowish color hence will add Levaquin for 3 days as well.  Trial of Lasix due to elevated proBNP and some crackles on exam.  Monitor closely.  At risk for delirium due to underlying dementia.  According to the son lately she has had multiple falls at the nursing home and seems to be rapidly declining in terms of her dementia and neurological capacity.   Subjective:   Patient in bed, appears comfortable, denies any headache, no fever, no chest pain or pressure, no shortness of breath , no abdominal pain. No new focal weakness.   Assessment  & Plan :    Acute Hypoxic Resp. Failure due to Acute Covid 19 Viral Pneumonitis + Acute on Chr.Diastolic CHF EF 34% -  due to COVID-19 infection along with acute on chronic CHF.  She is being appropriately treated with IV steroids, along with Paxlovid , she also has a productive cough with yellowish color hence will added Levaquin for 3 days as well.  Received few doses of Lasix for CHF decompensation as well.  She is much improved clinically, will advance activity and titrate down oxygen.  Likely  discharge in the next 1 to 2 days.  Of note she has received 2 COVID-vaccine shots thus far.  Sepsis pathophysiology due to COVID-19 pneumonia present upon admission has resolved.   Encouraged the patient to sit up in chair in the daytime use I-S and flutter valve for pulmonary  toiletry.  Will advance activity and titrate down oxygen as possible.  SpO2: 96 % O2 Flow Rate (L/min): 2 L/min   2. Underlying Dementia - at risk for delirium, supportive care.  3.  Acute on chronic diastolic CHF EF 85% with severe mitral valve stenosis and mild aortic stenosis.  Supportive care, diurese with IV lasix, outpatient cardiology follow-up.  Continue beta-blocker.  4.  Multiple falls at SNF and failing memory.  Supportive care.  PT OT.   5.  Hypertension.  On beta-blocker continue.       Condition - Extremely Guarded  Family Communication  :  son [name] [phone] on 11/01/20 >> DNR, 11/02/20  Code Status :  DNR  Consults  :  None  PUD Prophylaxis :    Procedures  :     CT Head -  Atrophy, chronic microvascular disease. No acute intracranial abnormality.      Disposition Plan  :    Status is: Inpatient  DVT Prophylaxis  :    enoxaparin (LOVENOX) injection 40 mg Start: 11/01/21 1000  Lab Results  Component Value Date   PLT 144 (L) 11/03/2021    Diet :  Diet Order             Diet Heart Room service appropriate? No; Fluid consistency: Thin  Diet effective now                    Inpatient Medications  Scheduled Meds:  vitamin C  500 mg Oral Daily   aspirin  325 mg Oral Daily   enoxaparin (LOVENOX) injection  40 mg Subcutaneous Q24H   escitalopram  20 mg Oral Daily   melatonin  3 mg Oral QHS   methylPREDNISolone (SOLU-MEDROL) injection  40 mg Intravenous Q24H   metoprolol tartrate  12.5 mg Oral BID   nirmatrelvir/ritonavir EUA  3 tablet Oral BID   senna-docusate  1 tablet Oral BID   zinc sulfate  220 mg Oral Daily   Continuous  Infusions:   PRN Meds:.acetaminophen **OR** acetaminophen, albuterol, guaiFENesin-dextromethorphan, hydrALAZINE, [DISCONTINUED] ondansetron **OR** ondansetron (ZOFRAN) IV, polyethylene glycol, QUEtiapine  Antibiotics  :    Anti-infectives (From admission, onward)    Start     Dose/Rate Route Frequency Ordered Stop   11/02/21 1000  remdesivir 100 mg in sodium chloride 0.9 % 100 mL IVPB  Status:  Discontinued       See Hyperspace for full Linked Orders Report.   100 mg 200 mL/hr over 30 Minutes Intravenous Daily 11/01/21 0429 11/01/21 1355   11/01/21 2200  nirmatrelvir/ritonavir EUA (PAXLOVID) 3 tablet        3 tablet Oral 2 times daily 11/01/21 1355 11/06/21 2159   11/01/21 1345  levofloxacin (LEVAQUIN) tablet 500 mg        500 mg Oral Daily 11/01/21 1335 11/03/21 0858   11/01/21 0600  remdesivir 200 mg in sodium chloride 0.9% 250 mL IVPB       See Hyperspace for full Linked Orders Report.   200 mg 580 mL/hr over 30 Minutes Intravenous Once 11/01/21 0429 11/01/21 0724   11/01/21 0130  nirmatrelvir/ritonavir EUA (PAXLOVID) 3 tablet  Status:  Discontinued        3 tablet Oral 2 times daily 11/01/21 0047 11/01/21 0429        Time Spent in minutes  30   Lala Lund M.D on 11/03/2021 at 12:06 PM  To page go to [email or web address]   [facility] -  Office  [phone]  See all Orders from today for further details    Objective:   Vitals:   11/02/21 2310 11/03/21 0000 11/03/21 0332 11/03/21 0750  BP: 124/73 134/67 114/76 121/69  Pulse: 61 (!) 54 79 61  Resp: 16 14 16 13   Temp: 97.9 F (36.6 C)  98 F (36.7 C) 97.9 F (36.6 C)  TempSrc: Oral  Oral Oral  SpO2: 94% 94% 93% 96%  Weight:      Height:        Wt Readings from Last 3 Encounters:  11/01/21 60.7 kg  06/26/21 52.3 kg  07/27/18 73 kg     Intake/Output Summary (Last 24 hours) at 11/03/2021 1206 Last data filed at 11/03/2021 1134 Gross per 24 hour  Intake 720 ml  Output 700 ml  Net 20 ml      Physical Exam  Awake Alert x 2, No new F.N deficits, chronic minimal R.sided weakness [place].AT,PERRAL Supple Neck, No JVD,   Symmetrical Chest wall movement, Good air movement bilaterally, CTAB RRR,No Gallops, Rubs or new Murmurs,  +ve B.Sounds, Abd Soft, No tenderness,   No Cyanosis, Clubbing or edema     RN pressure injury documentation: Pressure Injury 06/16/21 Buttocks Left Stage 2 -  Partial thickness loss of dermis presenting as a shallow open injury with a red, pink wound bed without slough. approx 10mm (Active)  06/16/21 0000  Location: Buttocks  Location Orientation: Left  Staging: Stage 2 -  Partial thickness loss of dermis presenting as a shallow open injury with a red, pink wound bed without slough.  Wound Description (Comments): approx 73mm  Present on Admission: No     Data Review:    CBC Recent Labs  Lab 10/31/21 2130 11/02/21 0251 11/03/21 0114  WBC 10.9* 6.4 10.8*  HGB 12.2 11.9* 11.3*  HCT 36.7 34.8* 34.5*  PLT 188 160 144*  MCV 88.4 86.4 88.7  MCH 29.4 29.5 29.0  MCHC 33.2 34.2 32.8  RDW 14.4 14.7 14.8  LYMPHSABS 0.9 0.9 0.9  MONOABS 1.2* 0.3 0.3  EOSABS 0.0 0.0 0.0  BASOSABS 0.0 0.0 0.0    Electrolytes Recent Labs  Lab 10/31/21 2130 11/01/21 0032 11/01/21 0657 11/01/21 0802 11/02/21 0251 11/03/21 0114  NA 138  --   --   --  140 137  K 3.7  --   --   --  4.0 4.1  CL 100  --   --   --  105 101  CO2 24  --   --   --  25 25  GLUCOSE 127*  --   --   --  128* 121*  BUN 10  --   --   --  19 27*  CREATININE 0.79  --   --   --  0.80 1.06*  CALCIUM 9.3  --   --   --  8.9 8.8*  AST 22  --   --   --  21 21  ALT 14  --   --   --  16 15  ALKPHOS 50  --   --   --  49 39  BILITOT 0.3  --   --   --  0.4 0.2*  ALBUMIN 3.5  --   --   --  3.0* 3.0*  MG  --   --   --   --  1.7 1.8  CRP  --   --  8.0*  --  9.1* 3.3*  DDIMER  --   --  0.68*  --  0.65* 0.38  PROCALCITON  --   --  <0.10  --   --   --   LATICACIDVEN 2.8* 2.5*  --  1.1  --   --    INR 1.1  --   --   --   --   --   BNP 1,428.6*  --   --   --  870.8* 383.6*    ------------------------------------------------------------------------------------------------------------------ No results for input(s): CHOL, HDL, LDLCALC, TRIG, CHOLHDL, LDLDIRECT in the last 72 hours.  No results found for: HGBA1C  No results for input(s): TSH, T4TOTAL, T3FREE, THYROIDAB in the last 72 hours.  Invalid input(s): FREET3 ------------------------------------------------------------------------------------------------------------------ ID Labs Recent Labs  Lab 10/31/21 2130 11/01/21 0032 11/01/21 0657 11/01/21 0802 11/02/21 0251 11/03/21 0114  WBC 10.9*  --   --   --  6.4 10.8*  PLT 188  --   --   --  160 144*  CRP  --   --  8.0*  --  9.1* 3.3*  DDIMER  --   --  0.68*  --  0.65* 0.38  PROCALCITON  --   --  <0.10  --   --   --   LATICACIDVEN 2.8* 2.5*  --  1.1  --   --   CREATININE 0.79  --   --   --  0.80 1.06*   Cardiac Enzymes No results for input(s): CKMB, TROPONINI, MYOGLOBIN in the last 168 hours.  Invalid input(s): CK  Radiology Reports CT Head Wo Contrast  Result Date: 10/31/2021 CLINICAL DATA:  Head trauma, minor (Age >= 65y).  Fall. EXAM: CT HEAD WITHOUT CONTRAST TECHNIQUE: Contiguous axial images were obtained from the base of the skull through the vertex without intravenous contrast. RADIATION DOSE REDUCTION: This exam was performed according to the departmental dose-optimization program which includes automated exposure control, adjustment of the mA and/or kV according to patient size and/or use of iterative reconstruction technique. COMPARISON:  06/25/2021 FINDINGS: Brain: There is atrophy and chronic small vessel disease changes. No acute intracranial abnormality. Specifically, no hemorrhage, hydrocephalus, mass lesion, acute infarction, or significant intracranial injury. Vascular: No hyperdense vessel or unexpected calcification. Skull: No acute calvarial  abnormality. Sinuses/Orbits: No acute findings Other: None IMPRESSION: Atrophy, chronic microvascular disease. No acute intracranial abnormality. Electronically Signed   By: Rolm Baptise M.D.   On: 10/31/2021 22:17   DG Chest Port 1 View  Result Date: 11/02/2021 CLINICAL DATA:  Shortness of breath, COVID positive EXAM: PORTABLE CHEST 1 VIEW COMPARISON:  Radiograph 10/31/2021 FINDINGS: Unchanged cardiomediastinal silhouette. Mild interstitial opacities. No large pleural effusion. No visible pneumothorax. Bilateral shoulder osteoarthritis. Thoracic spondylosis. No acute osseous abnormality. IMPRESSION: Mild interstitial opacities, possibly mild edema or atypical infectious/inflammatory process. Electronically Signed   By: Maurine Simmering M.D.   On: 11/02/2021 12:56   DG Chest Port 1 View  Result Date: 10/31/2021 CLINICAL DATA:  Questionable sepsis EXAM: PORTABLE CHEST 1 VIEW COMPARISON:  06/12/2021 FINDINGS: Heart is normal size. Vascular congestion. Interstitial prominence. No focal confluent opacities or effusions. No acute bony abnormality. IMPRESSION: Mild vascular congestion and interstitial prominence. Findings could reflect interstitial edema. Electronically Signed   By: Rolm Baptise M.D.   On: 10/31/2021 21:40

## 2021-11-04 MED ORDER — NIRMATRELVIR/RITONAVIR (PAXLOVID)TABLET: 3.0000 | ORAL_TABLET | Freq: Two times a day (BID) | ORAL | 0 refills | Status: AC

## 2021-11-04 MED ORDER — METHYLPREDNISOLONE 4 MG PO TBPK: ORAL_TABLET | ORAL | 0 refills | Status: DC

## 2021-11-04 MED ORDER — ALBUTEROL SULFATE HFA 108 (90 BASE) MCG/ACT IN AERS
2.0000 | INHALATION_SPRAY | RESPIRATORY_TRACT | Status: AC | PRN
Start: 2021-11-04 — End: ?

## 2021-11-04 NOTE — Plan of Care (Signed)

## 2021-11-04 NOTE — TOC Transition Note (Addendum)
Transition of Care [facility]) - CM/SW Discharge Note   Patient Details  Name: DIA DONATE MRN: [id number] Date of Birth: 29-Sep-1945  Transition of [facility]) CM/SW Contact:  Benard Halsted, LCSW Phone Number: 11/04/2021, 1:11 PM   Clinical Narrative:    Patient will DC to: [facility] ([place]) Anticipated DC date: 11/04/21 Family notified: Harriette Ohara Transport by: [employer] approval: Ref [id number], Auth ID # [id number], effective 11/04/2021-11/08/2021.  Per MD patient ready for DC to [facility]. RN to call report prior to discharge [phone]). RN, patient, patient's family, and facility notified of DC. Discharge Summary and FL2 sent to facility. DC packet on chart. Ambulance transport requested for patient.   CSW will sign off for now as social work intervention is no longer needed. Please consult [place] again if new needs arise.     Final next level of care: Skilled Nursing Facility Barriers to Discharge: Barriers Resolved   Patient Goals and CMS Choice Patient states their goals for this hospitalization and ongoing recovery are:: Feel better CMS Medicare.gov Compare Post Acute Care list provided to:: Patient Represenative (must comment) Choice offered to / list presented to : Adult Children  Discharge Placement   Existing PASRR number confirmed : 11/04/21          Patient chooses bed at: [facility] Patient to be transferred to facility by: [place] Name of family member notified: Son Patient and family notified of of transfer: 11/04/21  Discharge Plan and Services In-house Referral: Clinical Social Work   Post Acute Care Choice: [place]                               Social Determinants of Health (SDOH) Interventions     Readmission Risk Interventions No flowsheet data found.

## 2021-11-04 NOTE — Discharge Instructions (Signed)
Follow with Primary MD Nolene Ebbs, MD in 7 days   Get CBC, CMP, Magnesium, 2 view Chest X ray -  checked next visit within 1 week by  SNF MD   Activity: As tolerated with Full fall precautions use walker/cane & assistance as needed  Disposition SNF  Diet: Heart Healthy - Check your Weight same time everyday, if you gain over 2 pounds, or you develop in leg swelling, experience more shortness of breath or chest pain, call your Primary MD immediately. Follow Cardiac Low Salt Diet and 1.5 lit/day fluid restriction.  Special Instructions: If you have smoked or chewed Tobacco  in the last 2 yrs please stop smoking, stop any regular Alcohol  and or any Recreational drug use.  On your next visit with your primary care physician please Get Medicines reviewed and adjusted.  Please request your Prim.MD to go over all Hospital Tests and Procedure/Radiological results at the follow up, please get all Hospital records sent to your Prim MD by signing hospital release before you go home.  If you experience worsening of your admission symptoms, develop shortness of breath, life threatening emergency, suicidal or homicidal thoughts you must seek medical attention immediately by calling 911 or calling your MD immediately  if symptoms less severe.  You Must read complete instructions/literature along with all the possible adverse reactions/side effects for all the Medicines you take and that have been prescribed to you. Take any new Medicines after you have completely understood and accpet all the possible adverse reactions/side effects.

## 2021-11-04 NOTE — Progress Notes (Signed)
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 Incontinent of bowel and bladder requiring full bed change. Pt to return to facility today.    Recommendations for follow up therapy are one component of a multi-disciplinary discharge planning process, led by the attending physician.  Recommendations may be updated based on patient status, additional functional criteria and insurance authorization.  Follow Up Recommendations  Skilled nursing-short term rehab (<3 hours/day)     Assistance Recommended at Discharge Frequent or constant Supervision/Assistance  Patient can return home with the following A little help with walking and/or transfers;Assistance with cooking/housework;Assist for transportation;Help with stairs or ramp for entrance   Equipment Recommendations  Rolling walker (2 wheels)    Recommendations for Other Services       Precautions / Restrictions Precautions Precautions: Fall     Mobility  Bed Mobility Overal bed mobility: Needs Assistance Bed Mobility: Rolling, Supine to Sit, Sit to Supine Rolling: Min assist   Supine to sit: Min assist Sit to supine: Min assist   General bed mobility comments: Assist to elevate trunk into sitting. Assist to bring legs back into bed returning to supine.    Transfers Overall transfer level: Needs assistance Equipment  used: Rolling walker (2 wheels) Transfers: Sit to/from Stand Sit to Stand: Min assist           General transfer comment: Assist to bring hips up and for balance    Ambulation/Gait Ambulation/Gait assistance: Min assist Gait Distance (Feet): 30 Feet Assistive device: Rolling walker (2 wheels) Gait Pattern/deviations: Step-through pattern, Decreased step length - right, Decreased dorsiflexion - right Gait velocity: decr Gait velocity interpretation: <1.8 ft/sec, indicate of risk for recurrent falls   General Gait Details: Assist for balance   Stairs             Wheelchair Mobility    Modified Rankin (Stroke Patients Only)       Balance Overall balance assessment: Needs assistance Sitting-balance support: No upper extremity supported, Feet supported Sitting balance-Leahy Scale: Fair     Standing balance support: Bilateral upper extremity supported, During functional activity Standing balance-Leahy Scale: Poor Standing balance comment: rolling walker and min guard for static standing                            Cognition Arousal/Alertness: Awake/alert Behavior During Therapy: WFL for tasks assessed/performed Overall Cognitive Status: No family/caregiver present to determine baseline cognitive functioning Area of Impairment: Memory, Following commands, Awareness                     Memory: Decreased short-term memory Following Commands: Follows multi-step commands with increased time Safety/Judgement: Decreased awareness of deficits              Exercises      General Comments        Pertinent Vitals/Pain Pain Assessment Pain Assessment: No/denies pain    Home Living  Prior Function            PT Goals (current goals can now be found in the care plan section) Progress towards PT goals: Progressing toward goals    Frequency    Min 2X/week      PT Plan Current plan remains  appropriate    Co-evaluation              AM-PAC PT "6 Clicks" Mobility   Outcome Measure  Help needed turning from your back to your side while in a flat bed without using bedrails?: A Little Help needed moving from lying on your back to sitting on the side of a flat bed without using bedrails?: A Little Help needed moving to and from a bed to a chair (including a wheelchair)?: A Little Help needed standing up from a chair using your arms (e.g., wheelchair or bedside chair)?: A Little Help needed to walk in hospital room?: A Little Help needed climbing 3-5 steps with a railing? : Total 6 Click Score: 16    End of Session Equipment Utilized During Treatment: Gait belt Activity Tolerance: Patient tolerated treatment well Patient left: with call bell/phone within reach;in bed;with bed alarm set   PT Visit Diagnosis: Unsteadiness on feet (R26.81);Muscle weakness (generalized) (M62.81)     Time: 1524-1600 PT Time Calculation (min) (ACUTE ONLY): 36 min  Charges:  $Gait Training: 23-37 mins                     [place] Pager [phone] Office [place] 11/04/2021, 4:57 PM

## 2021-11-04 NOTE — Discharge Summary (Signed)
Christine Christensen FWY:[id number] DOB: 1946-07-25 DOA: 10/31/2021  PCP: Nolene Ebbs, MD  Admit date: 10/31/2021  Discharge date: 11/04/2021  Admitted From: Home  Disposition: Home   Recommendations for Outpatient Follow-up:   Follow up with PCP in 1-2 weeks  PCP Please obtain BMP/CBC, 2 view CXR in 1week,  (see Discharge instructions)   PCP Please follow up on the following pending results:    Home Health: None Equipment/Devices: None Consultations: None  Discharge Condition: Stable    CODE STATUS: DNR   Diet Recommendation: Heart Healthy with 1.5 L fluid restriction per day.   Chief Complaint  Patient presents with   Fall     Brief history of present illness from the day of admission and additional interim summary    Patient admitted few hours ago for fall at home due to generalized weakness and deconditioning, with underlying sepsis due to COVID-19 infection along with acute on chronic CHF.  Is been appropriately treated with IV steroids, antivirals started in the ER will complete 3 days, has a productive cough with yellowish color hence will add Levaquin for 3 days as well.  Trial of Lasix due to elevated proBNP and some crackles on exam.  Monitor closely.  At risk for delirium due to underlying dementia.  According to the son lately she has had multiple falls at the nursing home and seems to be rapidly declining in terms of her dementia and neurological capacity.                                                                 Hospital Course     Acute Hypoxic Resp. Failure due to Acute Covid 19 Viral Pneumonitis + Acute on Chr.Diastolic CHF EF 27% -her symptoms were due to COVID-19 infection along with mild acute on chronic CHF.  She was treated with IV steroids, along with Paxlovid , she also had a  productive cough with yellowish color hence she received Levaquin for 3 days as well.  Also got 2 doses of Lasix for CHF decompensation..  Now sepsis pathophysiology completely resolved, she is completely symptom-free on room air.  Will give her a short taper of steroids along with 4 more doses of Paxlovid at SNF.  She has finished her antibiotic and diuretic treatment.  Monitor fluid status and use as needed Lasix at SNF as needed.    2. Underlying Dementia - at risk for delirium, supportive care.   3.  Acute on chronic diastolic CHF EF 74% with severe mitral valve stenosis and mild aortic stenosis.  Supportive care, diurese with IV lasix, outpatient cardiology follow-up.  Continue beta-blocker.   4.  Multiple falls at SNF and failing memory.  Supportive care.  PT OT.  Require SNF.   5.  Hypertension.  On beta-blocker continue.  Discharge diagnosis     Principal Problem:   Sepsis due to COVID-19 [facility]) Active Problems:   Fall at home, initial encounter   Lactic acidosis   Acute on chronic diastolic CHF (congestive heart failure) ([place])   Essential hypertension   Vascular dementia [facility])    Discharge instructions    Discharge Instructions     Diet - low sodium heart healthy   Complete by: As directed    Discharge instructions   Complete by: As directed    Follow with Primary MD Nolene Ebbs, MD in 7 days   Get CBC, CMP, Magnesium, 2 view Chest X ray -  checked next visit within 1 week by  SNF MD   Activity: As tolerated with Full fall precautions use walker/cane & assistance as needed  Disposition SNF  Diet: Heart Healthy - Check your Weight same time everyday, if you gain over 2 pounds, or you develop in leg swelling, experience more shortness of breath or chest pain, call your Primary MD immediately. Follow Cardiac Low Salt Diet and 1.5 lit/day fluid restriction.  Special Instructions: If you have smoked or chewed Tobacco  in the last 2 yrs please stop smoking, stop any  regular Alcohol  and or any Recreational drug use.  On your next visit with your primary care physician please Get Medicines reviewed and adjusted.  Please request your Prim.MD to go over all Hospital Tests and Procedure/Radiological results at the follow up, please get all Hospital records sent to your Prim MD by signing hospital release before you go home.  If you experience worsening of your admission symptoms, develop shortness of breath, life threatening emergency, suicidal or homicidal thoughts you must seek medical attention immediately by calling 911 or calling your MD immediately  if symptoms less severe.  You Must read complete instructions/literature along with all the possible adverse reactions/side effects for all the Medicines you take and that have been prescribed to you. Take any new Medicines after you have completely understood and accpet all the possible adverse reactions/side effects.   Increase activity slowly   Complete by: As directed        Discharge Medications   Allergies as of 11/04/2021       Reactions   Penicillins Other (See Comments)   Unknown reaction        Medication List     STOP taking these medications    naproxen sodium 220 MG tablet Commonly known as: ALEVE   QUEtiapine 25 MG tablet Commonly known as: SEROQUEL       TAKE these medications    acetaminophen 500 MG tablet Commonly known as: TYLENOL Take 2 tablets (1,000 mg total) by mouth every 6 (six) hours as needed for mild pain, moderate pain, fever or headache.   albuterol 108 (90 Base) MCG/ACT inhaler Commonly known as: VENTOLIN HFA Inhale 2 puffs into the lungs every 4 (four) hours as needed for wheezing or shortness of breath.   aspirin 325 MG tablet Take 1 tablet (325 mg total) by mouth daily.   escitalopram 20 MG tablet Commonly known as: LEXAPRO Take 20 mg by mouth every morning.   feeding supplement Liqd Take 237 mLs by mouth 2 (two) times daily between meals.    fexofenadine 180 MG tablet Commonly known as: ALLEGRA Take 180 mg by mouth daily as needed for allergies or rhinitis.   melatonin 3 MG Tabs tablet Take 1 tablet (3 mg total) by mouth at bedtime.   methylPREDNISolone 4 MG Tbpk  tablet Commonly known as: MEDROL DOSEPAK follow package directions   metoprolol tartrate 25 MG tablet Commonly known as: LOPRESSOR Take 0.5 tablets (12.5 mg total) by mouth 2 (two) times daily.   multivitamin with minerals Tabs tablet Take 1 tablet by mouth daily.   nirmatrelvir/ritonavir EUA 20 x 150 MG & 10 x 100MG  Tabs Commonly known as: PAXLOVID Take 3 tablets by mouth 2 (two) times daily for 4 doses.   polyethylene glycol 17 g packet Commonly known as: MIRALAX / GLYCOLAX Take 17 g by mouth 2 (two) times daily.   senna-docusate 8.6-50 MG tablet Commonly known as: Senokot-S Take 1 tablet by mouth 2 (two) times daily.         Follow-up Information     Nolene Ebbs, MD. Schedule an appointment as soon as possible for a visit in 1 week(s).   Specialty: Internal Medicine Contact information: [address] [place] [address] [phone]                 Major procedures and Radiology Reports - PLEASE review detailed and final reports thoroughly  -       CT Head Wo Contrast  Result Date: 10/31/2021 CLINICAL DATA:  Head trauma, minor (Age >= 65y).  Fall. EXAM: CT HEAD WITHOUT CONTRAST TECHNIQUE: Contiguous axial images were obtained from the base of the skull through the vertex without intravenous contrast. RADIATION DOSE REDUCTION: This exam was performed according to the departmental dose-optimization program which includes automated exposure control, adjustment of the mA and/or kV according to patient size and/or use of iterative reconstruction technique. COMPARISON:  06/25/2021 FINDINGS: Brain: There is atrophy and chronic small vessel disease changes. No acute intracranial abnormality. Specifically, no hemorrhage,  hydrocephalus, mass lesion, acute infarction, or significant intracranial injury. Vascular: No hyperdense vessel or unexpected calcification. Skull: No acute calvarial abnormality. Sinuses/Orbits: No acute findings Other: None IMPRESSION: Atrophy, chronic microvascular disease. No acute intracranial abnormality. Electronically Signed   By: Rolm Baptise M.D.   On: 10/31/2021 22:17   DG Chest Port 1 View  Result Date: 11/02/2021 CLINICAL DATA:  Shortness of breath, COVID positive EXAM: PORTABLE CHEST 1 VIEW COMPARISON:  Radiograph 10/31/2021 FINDINGS: Unchanged cardiomediastinal silhouette. Mild interstitial opacities. No large pleural effusion. No visible pneumothorax. Bilateral shoulder osteoarthritis. Thoracic spondylosis. No acute osseous abnormality. IMPRESSION: Mild interstitial opacities, possibly mild edema or atypical infectious/inflammatory process. Electronically Signed   By: Maurine Simmering M.D.   On: 11/02/2021 12:56   DG Chest Port 1 View  Result Date: 10/31/2021 CLINICAL DATA:  Questionable sepsis EXAM: PORTABLE CHEST 1 VIEW COMPARISON:  06/12/2021 FINDINGS: Heart is normal size. Vascular congestion. Interstitial prominence. No focal confluent opacities or effusions. No acute bony abnormality. IMPRESSION: Mild vascular congestion and interstitial prominence. Findings could reflect interstitial edema. Electronically Signed   By: Rolm Baptise M.D.   On: 10/31/2021 21:40    Today   Subjective   Christine Christensen today has no headache,no chest abdominal pain,no new weakness tingling or numbness, feels much better wants to go home today.     Objective   Blood pressure 145/70, pulse 78, temperature 98 F (36.7 C), temperature source Oral, resp. rate 16, height 5\' 4"  (1.626 m), weight 60.7 kg, SpO2 98 %.   Intake/Output Summary (Last 24 hours) at 11/04/2021 1041 Last data filed at 11/04/2021 0321 Gross per 24 hour  Intake 240 ml  Output 600 ml  Net -360 ml    Exam  Awake Alert x 2, No  new F.N deficits,  [place].AT,PERRAL Supple Neck,   Symmetrical Chest wall movement, Good air movement bilaterally, CTAB RRR,No Gallops,   +ve B.Sounds, Abd Soft, Non tender,  No Cyanosis, Clubbing or edema    Data Review   CBC w Diff:  Lab Results  Component Value Date   WBC 10.8 (H) 11/03/2021   HGB 11.3 (L) 11/03/2021   HCT 34.5 (L) 11/03/2021   PLT 144 (L) 11/03/2021   LYMPHOPCT 8 11/03/2021   MONOPCT 3 11/03/2021   EOSPCT 0 11/03/2021   BASOPCT 0 11/03/2021    CMP:  Lab Results  Component Value Date   NA 137 11/03/2021   K 4.1 11/03/2021   CL 101 11/03/2021   CO2 25 11/03/2021   BUN 27 (H) 11/03/2021   CREATININE 1.06 (H) 11/03/2021   CREATININE 0.75 03/03/2021   PROT 6.1 (L) 11/03/2021   ALBUMIN 3.0 (L) 11/03/2021   BILITOT 0.2 (L) 11/03/2021   ALKPHOS 39 11/03/2021   AST 21 11/03/2021   ALT 15 11/03/2021  .   Total Time in preparing paper work, data evaluation and todays exam - 53 minutes  Lala Lund M.D on 11/04/2021 at 10:41 AM  [facility]

## 2021-11-05 LAB — CULTURE, BLOOD (ROUTINE X 2)
Culture: NO GROWTH
Culture: NO GROWTH
Special Requests: ADEQUATE
Special Requests: ADEQUATE

## 2021-12-25 ENCOUNTER — Other Ambulatory Visit: Payer: Self-pay

## 2021-12-25 ENCOUNTER — Emergency Department (HOSPITAL_COMMUNITY)
Admission: EM | Admit: 2021-12-25 | Discharge: 2021-12-25 | Disposition: A | Discharge status: Skilled Nursing Facility | Payer: Medicare Other | Attending: Emergency Medicine | Admitting: Emergency Medicine

## 2021-12-25 ENCOUNTER — Emergency Department (HOSPITAL_COMMUNITY): Payer: Medicare Other

## 2021-12-25 ENCOUNTER — Encounter (HOSPITAL_COMMUNITY): Payer: Self-pay | Admitting: *Deleted

## 2021-12-25 DIAGNOSIS — I5032 Chronic diastolic (congestive) heart failure: Secondary | ICD-10-CM | POA: Diagnosis not present

## 2021-12-25 DIAGNOSIS — M25551 Pain in right hip: Secondary | ICD-10-CM | POA: Diagnosis not present

## 2021-12-25 DIAGNOSIS — W01198A Fall on same level from slipping, tripping and stumbling with subsequent striking against other object, initial encounter: Secondary | ICD-10-CM | POA: Insufficient documentation

## 2021-12-25 DIAGNOSIS — I110 Hypertensive heart disease with heart failure: Secondary | ICD-10-CM | POA: Diagnosis not present

## 2021-12-25 DIAGNOSIS — F0150 Vascular dementia without behavioral disturbance: Secondary | ICD-10-CM | POA: Insufficient documentation

## 2021-12-25 DIAGNOSIS — W19XXXA Unspecified fall, initial encounter: Secondary | ICD-10-CM

## 2021-12-25 DIAGNOSIS — M25561 Pain in right knee: Secondary | ICD-10-CM | POA: Diagnosis not present

## 2021-12-25 DIAGNOSIS — S0083XA Contusion of other part of head, initial encounter: Secondary | ICD-10-CM | POA: Insufficient documentation

## 2021-12-25 DIAGNOSIS — Z79899 Other long term (current) drug therapy: Secondary | ICD-10-CM | POA: Diagnosis not present

## 2021-12-25 DIAGNOSIS — Z7982 Long term (current) use of aspirin: Secondary | ICD-10-CM | POA: Insufficient documentation

## 2021-12-25 DIAGNOSIS — S0990XA Unspecified injury of head, initial encounter: Secondary | ICD-10-CM | POA: Diagnosis present

## 2021-12-25 LAB — COMPREHENSIVE METABOLIC PANEL
ALT: 8 U/L (ref 0–44)
AST: 14 U/L — ABNORMAL LOW (ref 15–41)
Albumin: 3.3 g/dL — ABNORMAL LOW (ref 3.5–5.0)
Alkaline Phosphatase: 46 U/L (ref 38–126)
Anion gap: 6 (ref 5–15)
BUN: 9 mg/dL (ref 8–23)
CO2: 28 mmol/L (ref 22–32)
Calcium: 9.1 mg/dL (ref 8.9–10.3)
Chloride: 104 mmol/L (ref 98–111)
Creatinine, Ser: 0.86 mg/dL (ref 0.44–1.00)
GFR, Estimated: >60 mL/min (ref >60)
Glucose, Bld: 115 mg/dL — ABNORMAL HIGH (ref 70–99)
Potassium: 4.1 mmol/L (ref 3.5–5.1)
Sodium: 138 mmol/L (ref 135–145)
Total Bilirubin: 0.5 mg/dL (ref 0.3–1.2)
Total Protein: 6.5 g/dL (ref 6.5–8.1)

## 2021-12-25 LAB — CBC WITH DIFFERENTIAL/PLATELET
Abs Immature Granulocytes: 0.02 10*3/uL (ref 0.00–0.07)
Basophils Absolute: 0 10*3/uL (ref 0.0–0.1)
Basophils Relative: 1 %
Eosinophils Absolute: 0.2 10*3/uL (ref 0.0–0.5)
Eosinophils Relative: 3 %
HCT: 34.9 % — ABNORMAL LOW (ref 36.0–46.0)
Hemoglobin: 11.4 g/dL — ABNORMAL LOW (ref 12.0–15.0)
Immature Granulocytes: 0 %
Lymphocytes Relative: 31 %
Lymphs Abs: 1.7 10*3/uL (ref 0.7–4.0)
MCH: 29.8 pg (ref 26.0–34.0)
MCHC: 32.7 g/dL (ref 30.0–36.0)
MCV: 91.4 fL (ref 80.0–100.0)
Monocytes Absolute: 0.5 10*3/uL (ref 0.1–1.0)
Monocytes Relative: 9 %
Neutro Abs: 3.2 10*3/uL (ref 1.7–7.7)
Neutrophils Relative %: 56 %
Platelets: 206 10*3/uL (ref 150–400)
RBC: 3.82 MIL/uL — ABNORMAL LOW (ref 3.87–5.11)
RDW: 15.1 % (ref 11.5–15.5)
WBC: 5.7 10*3/uL (ref 4.0–10.5)
nRBC: 0 % (ref 0.0–0.2)

## 2021-12-25 LAB — TROPONIN I (HIGH SENSITIVITY): Troponin I (High Sensitivity): 7 ng/L (ref <18)

## 2021-12-25 LAB — BRAIN NATRIURETIC PEPTIDE: B Natriuretic Peptide: 247 pg/mL — ABNORMAL HIGH (ref 0.0–100.0)

## 2021-12-25 MED ORDER — ACETAMINOPHEN 325 MG PO TABS
650.0000 mg | ORAL_TABLET | Freq: Once | ORAL | Status: AC
  Administered 2021-12-25: 650 mg via ORAL
  Filled 2021-12-25: qty 2

## 2021-12-25 NOTE — ED Triage Notes (Signed)
The pt arrived by [facility] from [facility] she fell yesterday  she has no new complaints  she has been there since February after a rt hip fracture  she has some pain from that but no new pain from the fall.  Her family insisted that she come in ?

## 2021-12-25 NOTE — ED Provider Notes (Signed)
?[facility] ?Provider Note ? ? ?CSN: [id number] ?Arrival date & time: 12/25/21  1735 ? ?  ? ?History ?Chief Complaint  ?Patient presents with  ? Fall  ? ? ?Christine Christensen is a 76 y.o. female with recent right hip fracture presenting to the ED after fall yesterday.  Patient reports was unwitnessed.  She said she hit her head first.  She does not report worsening right hip pain since the fall but does say her right knee has been hurting worse.  She denies any neck pain, headache, changes in her ambulatory ability, fevers, chills, nausea, vomiting.  Patient states she fell like she tripped and fell.  Abrasion to front of her forehead. family is really concerned and wanted to come to the emergency department to be evaluated. ? ?HPI ? ?  ? ?Home Medications ?Prior to Admission medications   ?Medication Sig Start Date End Date Taking? Authorizing Provider  ?acetaminophen (TYLENOL) 500 MG tablet Take 2 tablets (1,000 mg total) by mouth every 6 (six) hours as needed for mild pain, moderate pain, fever or headache. 06/25/21   Regalado, Belkys A, MD  ?albuterol (VENTOLIN HFA) 108 (90 Base) MCG/ACT inhaler Inhale 2 puffs into the lungs every 4 (four) hours as needed for wheezing or shortness of breath. 11/04/21   Thurnell Lose, MD  ?aspirin 325 MG tablet Take 1 tablet (325 mg total) by mouth daily. 06/25/21   Regalado, Belkys A, MD  ?escitalopram (LEXAPRO) 20 MG tablet Take 20 mg by mouth every morning.    [provider]  ?feeding supplement (ENSURE ENLIVE / ENSURE PLUS) LIQD Take 237 mLs by mouth 2 (two) times daily between meals. 06/25/21   Regalado, Belkys A, MD  ?fexofenadine (ALLEGRA) 180 MG tablet Take 180 mg by mouth daily as needed for allergies or rhinitis.    [provider]  ?melatonin 3 MG TABS tablet Take 1 tablet (3 mg total) by mouth at bedtime. 06/25/21   Regalado, Jerald Kief A, MD  ?methylPREDNISolone (MEDROL DOSEPAK) 4 MG TBPK tablet follow package  directions 11/04/21   Thurnell Lose, MD  ?metoprolol tartrate (LOPRESSOR) 25 MG tablet Take 0.5 tablets (12.5 mg total) by mouth 2 (two) times daily. 06/25/21   Regalado, Jerald Kief A, MD  ?Multiple Vitamin (MULTIVITAMIN WITH MINERALS) TABS tablet Take 1 tablet by mouth daily. 06/25/21   Regalado, Belkys A, MD  ?polyethylene glycol (MIRALAX / GLYCOLAX) 17 g packet Take 17 g by mouth 2 (two) times daily. 06/25/21   Regalado, Belkys A, MD  ?senna-docusate (SENOKOT-S) 8.6-50 MG tablet Take 1 tablet by mouth 2 (two) times daily. 06/25/21   Regalado, Cassie Freer, MD  ?   ? ?Allergies    ?Penicillins   ? ?Review of Systems   ?Review of Systems  ?Constitutional:  Negative for chills and fever.  ?Respiratory:  Negative for chest tightness and shortness of breath.   ?Musculoskeletal:  Positive for arthralgias and myalgias.  ?Skin:  Negative for rash and wound.  ?Neurological:  Negative for dizziness and headaches.  ?Psychiatric/Behavioral:  Negative for confusion.   ? ?Physical Exam ?Updated Vital Signs ?BP (!) 148/84 (BP Location: Right Arm)   Pulse (!) 59   Temp 98.6 ?F (37 ?C) (Oral)   Resp 14   Ht '5\' 4"'$  (1.626 m)   Wt 60.7 kg   SpO2 96%   BMI 22.97 kg/m?  ?Physical Exam ?Vitals and nursing note reviewed.  ?Constitutional:   ?   General: She is  not in acute distress. ?   Appearance: She is well-developed.  ?HENT:  ?   Head: Normocephalic.  ?   Comments: Small contusion/ecchymosis to R anterior forehead ?Eyes:  ?   Extraocular Movements: Extraocular movements intact.  ?   Conjunctiva/sclera: Conjunctivae normal.  ?   Pupils: Pupils are equal, round, and reactive to light.  ?Cardiovascular:  ?   Rate and Rhythm: Normal rate and regular rhythm.  ?   Heart sounds: No murmur heard. ?Pulmonary:  ?   Effort: Pulmonary effort is normal. No respiratory distress.  ?   Breath sounds: Normal breath sounds.  ?Abdominal:  ?   Palpations: Abdomen is soft.  ?   Tenderness: There is no abdominal tenderness.  ?Musculoskeletal:     ?    General: Tenderness present. No swelling.  ?   Cervical back: Neck supple.  ?   Comments: Tenderness to right knee which is minimal on palpation ?Patient able to move bilateral lower and bilateral upper extremities. ?Neurovascular intact ?No C/T/L/S spine tenderness. ?Pelvis and chest stable to AP compression.  ?Skin: ?   General: Skin is warm and dry.  ?Neurological:  ?   Mental Status: She is alert. Mental status is at baseline.  ?   Motor: No weakness.  ?Psychiatric:     ?   Mood and Affect: Mood normal.  ? ? ?ED Results / Procedures / Treatments   ?Labs ?(all labs ordered are listed, but only abnormal results are displayed) ?Labs Reviewed  ?CBC WITH DIFFERENTIAL/PLATELET - Abnormal; Notable for the following components:  ?    Result Value  ? RBC 3.82 (*)   ? Hemoglobin 11.4 (*)   ? HCT 34.9 (*)   ? All other components within normal limits  ?COMPREHENSIVE METABOLIC PANEL - Abnormal; Notable for the following components:  ? Glucose, Bld 115 (*)   ? Albumin 3.3 (*)   ? AST 14 (*)   ? All other components within normal limits  ?BRAIN NATRIURETIC PEPTIDE - Abnormal; Notable for the following components:  ? B Natriuretic Peptide 247.0 (*)   ? All other components within normal limits  ?TROPONIN I (HIGH SENSITIVITY)  ?TROPONIN I (HIGH SENSITIVITY)  ? ? ?EKG ?EKG Interpretation ? ?Date/Time:  Sunday December 25 2021 18:29:51 EDT ?Ventricular Rate:  62 ?PR Interval:  172 ?QRS Duration: 72 ?QT Interval:  451 ?QTC Calculation: 458 ?R Axis:   72 ?Text Interpretation: Sinus rhythm Probable anteroseptal infarct, recent Confirmed by Ronnald Nian, Adam ([id number]) on 12/25/2021 6:46:06 PM ? ?Radiology ?DG Chest 1 View ? ?Result Date: 12/25/2021 ?CLINICAL DATA:  Fall with hip pain EXAM: CHEST  1 VIEW COMPARISON:  11/02/2021, 10/31/2021, 06/12/2021 FINDINGS: No focal opacity or pleural effusion. Stable cardiomediastinal silhouette with aortic atherosclerosis. No pneumothorax. IMPRESSION: No active disease. Electronically Signed   By: Donavan Foil M.D.   On: 12/25/2021 18:23  ? ?CT Head Wo Contrast ? ?Result Date: 12/25/2021 ?CLINICAL DATA:  Fall EXAM: CT HEAD WITHOUT CONTRAST CT CERVICAL SPINE WITHOUT CONTRAST TECHNIQUE: Multidetector CT imaging of the head and cervical spine was performed following the standard protocol without intravenous contrast. Multiplanar CT image reconstructions of the cervical spine were also generated. RADIATION DOSE REDUCTION: This exam was performed according to the departmental dose-optimization program which includes automated exposure control, adjustment of the mA and/or kV according to patient size and/or use of iterative reconstruction technique. COMPARISON:  CT brain 10/31/2021, 06/25/2021, CT angiography neck 12/20/2017 FINDINGS: CT HEAD FINDINGS Brain: No acute territorial infarction,  hemorrhage, or intracranial mass. Atrophy and chronic small vessel ischemic changes of the white matter. Multiple small chronic infarcts involving the right parasagittal frontal lobe, right cerebellum, pons and basal ganglia. Stable ventricle size Vascular: No hyperdense vessels.  Carotid vascular calcification Skull: Normal. Negative for fracture or focal lesion. Sinuses/Orbits: No acute finding. Other: None CT CERVICAL SPINE FINDINGS Alignment: Mild reversal of cervical lordosis. No subluxation. Facet alignment within normal limits Skull base and vertebrae: No acute fracture. No primary bone lesion or focal pathologic process. Soft tissues and spinal canal: No prevertebral fluid or swelling. No visible canal hematoma. Disc levels: Multilevel degenerative change. Mild disc space narrowing C3-C4 and C4-C5 with moderate disc space narrowing and degenerative change C5-C6 and C6-C7. Facet degenerative change at multiple levels with foraminal narrowing. Upper chest: Apical scarring and bleb. Heterogeneous thyroid nodules measuring up to 2.7 cm on the left, minimally enlarged since 2019. Other: None IMPRESSION: 1. No CT evidence for acute  intracranial abnormality. Atrophy and chronic small vessel ischemic changes of the white matter. Multiple chronic small infarcts. 2. Mild reversal of cervical lordosis with degenerative change. No acute osseous abnormality 3.

## 2021-12-25 NOTE — Discharge Instructions (Addendum)
There is concern for a possible posterior fibular head fracture which is right below your knee. This seems to correlate to where you are tender.  Due to already minimally weightbearing status, we have decided it safe to send you home to follow-up with your orthopedic regarding this possible new injury. ? ?Please follow-up with her primary care provider regarding and incidental finding on her CT scan which shows shows a thyroid nodule which has mildly increased from previous imaging.  ? ?Incidental Finding: Heterogeneous thyroid with nodules measuring up to 2.7 cm on the left. This is mildly increased compared to previous CT from 2019. Recommend thyroid ultrasound. This may be performed non emergently.   ?

## 2021-12-25 NOTE — ED Notes (Signed)
Attempted a call back to  [facility]  no amswer [phone] ?

## 2021-12-25 NOTE — ED Provider Notes (Signed)
I have personally seen and examined the Christine Christensen. I have reviewed the documentation on PMH/FH/Soc Hx. I have discussed the plan of care with the resident and Christine Christensen.  I have reviewed and agree with the resident's documentation. Please see associated encounter note. ? ?Briefly, the Christine Christensen is a 76 y.o. female here with fall.  Hit her head yesterday.  Family sent her for evaluation.  She did hit the front of her head.  She is mostly wheelchair-bound at this time.  She had hip fracture several months ago and has not done well since.  Christine Christensen is also some bilateral knee pain as well.  But currently only having right knee pain.  She is neurovascular neuromuscular intact.  CT scan of her head and neck were obtained which were unremarkable.  X-ray of the right knee per radiology there was may be a small fracture deformity at the posterior fibular head but overall not particularly tender in this area.  This is a weightbearing fracture if this is even a fracture.  We will have her follow-up with her orthopedic doctor.  Otherwise chest x-ray per my review and interpretation shows no pneumonia or pneumothorax.  Basic labs were ordered which per my review and interpretation are unremarkable.  Christine Christensen safe for discharge back to her skilled nursing facility. ? ?This chart was dictated using voice recognition software.  Despite best efforts to proofread,  errors can occur which can change the documentation meaning.  ? ? EKG Interpretation ? ?Date/Time:  Sunday December 25 2021 18:29:51 EDT ?Ventricular Rate:  62 ?PR Interval:  172 ?QRS Duration: 72 ?QT Interval:  451 ?QTC Calculation: 458 ?R Axis:   72 ?Text Interpretation: Sinus rhythm Probable anteroseptal infarct, recent Confirmed by Lennice Sites [phone]) on 12/25/2021 6:46:06 PM ?  ? ?  ? ? ?  ?Lennice Sites, DO ?12/25/21 1959 ? ?

## 2022-01-17 ENCOUNTER — Other Ambulatory Visit (HOSPITAL_COMMUNITY): Payer: Self-pay

## 2022-01-18 ENCOUNTER — Ambulatory Visit (HOSPITAL_COMMUNITY)
Admission: RE | Admit: 2022-01-18 | Discharge: 2022-01-18 | Disposition: A | Discharge status: Home / Self Care | Payer: Medicare Other | Source: Ambulatory Visit | Attending: Sports Medicine | Admitting: Sports Medicine

## 2022-01-18 DIAGNOSIS — M810 Age-related osteoporosis without current pathological fracture: Secondary | ICD-10-CM | POA: Insufficient documentation

## 2022-01-18 MED ORDER — ZOLEDRONIC ACID 5 MG/100ML IV SOLN
5.0000 mg | Freq: Once | INTRAVENOUS | Status: AC
  Administered 2022-01-18: 5 mg via INTRAVENOUS

## 2022-01-18 MED ORDER — ZOLEDRONIC ACID 5 MG/100ML IV SOLN
INTRAVENOUS | Status: AC
  Filled 2022-01-18: qty 100

## 2022-07-29 ENCOUNTER — Other Ambulatory Visit: Payer: Self-pay

## 2022-07-29 ENCOUNTER — Emergency Department (HOSPITAL_COMMUNITY)
Admission: EM | Admit: 2022-07-29 | Discharge: 2022-07-30 | Disposition: A | Discharge status: Home / Self Care | Payer: Medicare Other | Attending: Emergency Medicine | Admitting: Emergency Medicine

## 2022-07-29 ENCOUNTER — Encounter (HOSPITAL_COMMUNITY): Payer: Self-pay

## 2022-07-29 DIAGNOSIS — Z7982 Long term (current) use of aspirin: Secondary | ICD-10-CM | POA: Diagnosis not present

## 2022-07-29 DIAGNOSIS — F0390 Unspecified dementia without behavioral disturbance: Secondary | ICD-10-CM | POA: Diagnosis not present

## 2022-07-29 DIAGNOSIS — M79661 Pain in right lower leg: Secondary | ICD-10-CM | POA: Diagnosis not present

## 2022-07-29 DIAGNOSIS — W133XXA Fall through floor, initial encounter: Secondary | ICD-10-CM | POA: Insufficient documentation

## 2022-07-29 DIAGNOSIS — Z79899 Other long term (current) drug therapy: Secondary | ICD-10-CM | POA: Insufficient documentation

## 2022-07-29 DIAGNOSIS — M25551 Pain in right hip: Secondary | ICD-10-CM | POA: Diagnosis present

## 2022-07-29 DIAGNOSIS — I10 Essential (primary) hypertension: Secondary | ICD-10-CM | POA: Insufficient documentation

## 2022-07-29 DIAGNOSIS — W19XXXA Unspecified fall, initial encounter: Secondary | ICD-10-CM

## 2022-07-29 NOTE — ED Triage Notes (Addendum)
PER EMS: pt is from [facility] with hx of dementia slipped out of her bed today and fell onto the floor. Since then she has been c/o pain to right hip and leg. Hx of fracture to that hip previously. No deformities noted. She has right foot drop. Pt is at baseline mentation. Does not take blood thinners.   BP- 138/80, HR-66, O2-95%

## 2022-07-29 NOTE — ED Provider Triage Note (Signed)
Emergency Medicine Provider Triage Evaluation Note  Christine Christensen , a 76 y.o. female  was evaluated in triage.  Pt complains of right hip pain, however patient denies any pain at present moment.  Patient reports she slipped out of her bed earlier today.  The patient denies any her head, losing consciousness.  Patient denies headache, neck pain.  Patient is unsure why she is here, denies any excessive pain into her hip.  Patient is able to lift right leg up well.  No decree strength.  No obvious shortening or rotation.  Review of Systems  Positive:  Negative:   Physical Exam  BP 125/71 (BP Location: Right Arm)   Pulse 63   Temp 98.9 F (37.2 C) (Oral)   Resp 16   SpO2 95%  Gen:   Awake, no distress   Resp:  Normal effort  MSK:   Moves extremities without difficulty  Other:    Medical Decision Making  Medically screening exam initiated at 6:28 PM.  Appropriate orders placed.  Christine Christensen was informed that the remainder of the evaluation will be completed by another provider, this initial triage assessment does not replace that evaluation, and the importance of remaining in the ED until their evaluation is complete.     Azucena Cecil, PA-C 07/29/22 1829

## 2022-07-30 ENCOUNTER — Emergency Department (HOSPITAL_COMMUNITY): Payer: Medicare Other

## 2022-07-30 MED ORDER — ACETAMINOPHEN 500 MG PO TABS
1000.0000 mg | ORAL_TABLET | Freq: Once | ORAL | Status: AC
  Administered 2022-07-30: 1000 mg via ORAL
  Filled 2022-07-30: qty 2

## 2022-07-30 NOTE — ED Provider Notes (Signed)
[facility] EMERGENCY DEPARTMENT Provider Note   CSN: [id number] Arrival date & time: 07/29/22  1753     History  Chief Complaint  Patient presents with   Christine Christensen is a 76 y.o. female.   Fall     33F with medical history significant for dementia, HTN, prior CVA, urinary incontinence at baseline, mild aortic stenosis presenting as a non-leveled trauma after a fall.  The history provided by EMS and the patient.  The patient presents from [facility] after slipping out of her bed today falling onto the floor.  She denies any head trauma or loss of consciousness but does not fully remember the fall.  She has complained of pain to the right hip and lower extremity after her fall.  She has not been ambulatory since the fall or attempted ambulation.  Per facility staff, the patient is at her baseline mentation.  She arrived to the emergency department GCS 14, AAO x2, ABC intact.  Home Medications Prior to Admission medications   Medication Sig Start Date End Date Taking? Authorizing Provider  acetaminophen (TYLENOL) 500 MG tablet Take 2 tablets (1,000 mg total) by mouth every 6 (six) hours as needed for mild pain, moderate pain, fever or headache. 06/25/21   Regalado, Belkys A, MD  albuterol (VENTOLIN HFA) 108 (90 Base) MCG/ACT inhaler Inhale 2 puffs into the lungs every 4 (four) hours as needed for wheezing or shortness of breath. 11/04/21   Thurnell Lose, MD  aspirin 325 MG tablet Take 1 tablet (325 mg total) by mouth daily. 06/25/21   Regalado, Belkys A, MD  escitalopram (LEXAPRO) 20 MG tablet Take 20 mg by mouth every morning.    [provider]  feeding supplement (ENSURE ENLIVE / ENSURE PLUS) LIQD Take 237 mLs by mouth 2 (two) times daily between meals. 06/25/21   Regalado, Belkys A, MD  fexofenadine (ALLEGRA) 180 MG tablet Take 180 mg by mouth daily as needed for allergies or rhinitis.    [provider]   melatonin 3 MG TABS tablet Take 1 tablet (3 mg total) by mouth at bedtime. 06/25/21   Regalado, Jerald Kief A, MD  methylPREDNISolone (MEDROL DOSEPAK) 4 MG TBPK tablet follow package directions 11/04/21   Thurnell Lose, MD  metoprolol tartrate (LOPRESSOR) 25 MG tablet Take 0.5 tablets (12.5 mg total) by mouth 2 (two) times daily. 06/25/21   Regalado, Belkys A, MD  Multiple Vitamin (MULTIVITAMIN WITH MINERALS) TABS tablet Take 1 tablet by mouth daily. 06/25/21   Regalado, Belkys A, MD  polyethylene glycol (MIRALAX / GLYCOLAX) 17 g packet Take 17 g by mouth 2 (two) times daily. 06/25/21   Regalado, Belkys A, MD  senna-docusate (SENOKOT-S) 8.6-50 MG tablet Take 1 tablet by mouth 2 (two) times daily. 06/25/21   Regalado, Jerald Kief A, MD      Allergies    Penicillins    Review of Systems   Review of Systems  Unable to perform ROS: Dementia    Physical Exam Updated Vital Signs BP 132/68   Pulse 66   Temp 97.9 F (36.6 C)   Resp (!) 21   Ht '5\' 4"'$  (1.626 m)   Wt 68 kg   SpO2 97%   BMI 25.75 kg/m  Physical Exam Vitals and nursing note reviewed.  Constitutional:      General: She is not in acute distress.    Appearance: She is well-developed.     Comments: GCS 15, ABC  intact  HENT:     Head: Normocephalic and atraumatic.  Eyes:     Extraocular Movements: Extraocular movements intact.     Conjunctiva/sclera: Conjunctivae normal.     Pupils: Pupils are equal, round, and reactive to light.  Neck:     Comments: No midline tenderness to palpation of the cervical spine.  Range of motion intact Cardiovascular:     Rate and Rhythm: Normal rate and regular rhythm.  Pulmonary:     Effort: Pulmonary effort is normal. No respiratory distress.     Breath sounds: Normal breath sounds.  Chest:     Comments: Clavicles stable nontender to AP compression.  Chest wall stable and nontender to AP and lateral compression. Abdominal:     Palpations: Abdomen is soft.     Tenderness: There is no abdominal  tenderness.     Comments: Pelvis stable to lateral compression  Musculoskeletal:     Cervical back: Neck supple.     Comments: No midline tenderness to palpation of the thoracic or lumbar spine.  Extremities atraumatic with intact range of motion with the exception of tenderness to palpation about the proximal greater trochanter, pain with attempts at range of motion of the right leg, distally neurovascularly intact  Skin:    General: Skin is warm and dry.  Neurological:     Mental Status: She is alert.     Comments: Cranial nerves II through XII grossly intact.  Moving all 4 extremities spontaneously.  Sensation grossly intact all 4 extremities     ED Results / Procedures / Treatments   Labs (all labs ordered are listed, but only abnormal results are displayed) Labs Reviewed - No data to display  EKG None  Radiology DG Femur Portable Min 2 Views Right  Result Date: 07/30/2022 CLINICAL DATA:  Fall.  Right hip/leg pain. EXAM: RIGHT FEMUR PORTABLE 2 VIEW COMPARISON:  12/25/2021. FINDINGS: No fracture.  No bone lesion. Right hip hemiarthroplasty appears well seated, well aligned and unchanged. Knee prosthetic components also appear well seated and aligned without evidence of loosening. Skeletal structures are demineralized. Arterial atherosclerotic calcifications. Soft tissues otherwise unremarkable. IMPRESSION: 1. No fracture or acute finding. No evidence of right hip or knee prosthesis loosening. Electronically Signed   By: Lajean Manes M.D.   On: 07/30/2022 10:48   DG Hip Unilat W or Wo Pelvis 2-3 Views Right  Result Date: 07/30/2022 CLINICAL DATA:  Status post fall.  Pain. EXAM: DG HIP (WITH OR WITHOUT PELVIS) 2-3V RIGHT COMPARISON:  12/25/2021. FINDINGS: Stable appearance of right total hip arthroplasty. No signs of periprosthetic fracture or dislocation. Degenerative changes are noted within the lumbar spine. Calcified uterine fibroids within the pelvis. IMPRESSION: 1. Previous  right total hip arthroplasty. 2. No signs of acute fracture or dislocation. Electronically Signed   By: Kerby Moors M.D.   On: 07/30/2022 09:18    Procedures Procedures    Medications Ordered in ED Medications  acetaminophen (TYLENOL) tablet 1,000 mg (has no administration in time range)    ED Course/ Medical Decision Making/ A&P                           Medical Decision Making Amount and/or Complexity of Data Reviewed Radiology: ordered.  Risk OTC drugs.     56F with medical history significant for dementia, HTN, prior CVA, urinary incontinence at baseline, mild aortic stenosis presenting as a non-leveled trauma after a fall.  The history provided by EMS  and the patient.  The patient presents from [place] facility after slipping out of her bed today falling onto the floor.  She denies any head trauma or loss of consciousness but does not fully remember the fall.  She has complained of pain to the right hip and lower extremity after her fall.  She has not been ambulatory since the fall or attempted ambulation.  Per facility staff, the patient is at her baseline mentation.  She arrived to the emergency department GCS 14, AAO x2, ABC intact.  Arrival, the patient was vitally stable.  At her baseline, physical exam with mild tenderness about the right thigh/greater trochanter.  X-ray imaging was performed which resulted negative for acute fracture or dislocation.  Patient is at her baseline per facility and is acting appropriately, well-appearing.  Do not feel that additional work-up is warranted at this time.  Stable for discharge with outpatient follow-up.   Final Clinical Impression(s) / ED Diagnoses Final diagnoses:  Fall, initial encounter    Rx / DC Orders ED Discharge Orders     None         Regan Lemming, MD 07/30/22 1200

## 2022-07-30 NOTE — ED Notes (Signed)
Called [facility] to give report on the patient. Called times 2 without an answer.

## 2022-07-30 NOTE — Discharge Instructions (Addendum)
Your femur and hip x-rays were negative for evidence of fracture or dislocation.  You are at your baseline and are stable for outpatient follow-up.

## 2023-02-04 ENCOUNTER — Emergency Department (HOSPITAL_COMMUNITY): Payer: Medicare Other

## 2023-02-04 ENCOUNTER — Emergency Department (HOSPITAL_COMMUNITY)
Admission: EM | Admit: 2023-02-04 | Discharge: 2023-02-05 | Disposition: A | Discharge status: Home / Self Care | Payer: Medicare Other | Attending: Emergency Medicine | Admitting: Emergency Medicine

## 2023-02-04 DIAGNOSIS — Z7982 Long term (current) use of aspirin: Secondary | ICD-10-CM | POA: Insufficient documentation

## 2023-02-04 DIAGNOSIS — W133XXA Fall through floor, initial encounter: Secondary | ICD-10-CM | POA: Insufficient documentation

## 2023-02-04 DIAGNOSIS — I10 Essential (primary) hypertension: Secondary | ICD-10-CM | POA: Diagnosis not present

## 2023-02-04 DIAGNOSIS — I6782 Cerebral ischemia: Secondary | ICD-10-CM | POA: Insufficient documentation

## 2023-02-04 DIAGNOSIS — E042 Nontoxic multinodular goiter: Secondary | ICD-10-CM | POA: Diagnosis present

## 2023-02-04 DIAGNOSIS — W19XXXA Unspecified fall, initial encounter: Secondary | ICD-10-CM

## 2023-02-04 MED ORDER — ACETAMINOPHEN 325 MG PO TABS
650.0000 mg | ORAL_TABLET | Freq: Once | ORAL | Status: AC
  Administered 2023-02-04: 650 mg via ORAL
  Filled 2023-02-04: qty 2

## 2023-02-04 NOTE — Discharge Instructions (Signed)
You have been seen in the Emergency Department (ED) today following a fall.  Your workup today did not reveal any injuries that require you to stay in the hospital. You can expect to be stiff and sore for the next several days.  Please take Tylenol or Motrin as needed for pain, but only as written on the box.  Please follow up with your primary care doctor as soon as possible regarding today's ED visit and your recent accident.  Your CT scan also showed incidental thyroid nodules which she will need outpatient follow-up for.  You will need outpatient ultrasound to further evaluate.  Have your primary care doctor schedule this.   Call your doctor or return to the ED if you develop a sudden or severe headache, confusion, slurred speech, facial droop, weakness or numbness in any arm or leg,  extreme fatigue, vomiting more than two times, severe abdominal pain, difficulty breathing or any other concerning signs or symptoms.

## 2023-02-04 NOTE — ED Provider Notes (Signed)
[place] EMERGENCY DEPARTMENT AT [facility] Provider Note   CSN: [id number] Arrival date & time: 02/04/23  1925     History  No chief complaint on file.   Christine Christensen is a 77 y.o. female.  With PMH of HTN, OA, history of CVA on aspirin who presents from [facility] after fall to the floor while trying to transfer to chair.  Patient remembers mechanical fall trying to get from her bed to her wheelchair.  She was not using a walker or any supportive device and she slipped on the  floor onto her bottom.  She did not lose consciousness.  She denied any preceding symptoms.  Denies any chest pain, shortness of breath, abdominal pain, difficulty breathing.  HPI     Home Medications Prior to Admission medications   Medication Sig Start Date End Date Taking? Authorizing Provider  acetaminophen (TYLENOL) 500 MG tablet Take 2 tablets (1,000 mg total) by mouth every 6 (six) hours as needed for mild pain, moderate pain, fever or headache. 06/25/21   Regalado, Belkys A, MD  albuterol (VENTOLIN HFA) 108 (90 Base) MCG/ACT inhaler Inhale 2 puffs into the lungs every 4 (four) hours as needed for wheezing or shortness of breath. 11/04/21   Leroy Sea, MD  aspirin 325 MG tablet Take 1 tablet (325 mg total) by mouth daily. 06/25/21   Regalado, Belkys A, MD  escitalopram (LEXAPRO) 20 MG tablet Take 20 mg by mouth every morning.    [provider]  feeding supplement (ENSURE ENLIVE / ENSURE PLUS) LIQD Take 237 mLs by mouth 2 (two) times daily between meals. 06/25/21   Regalado, Belkys A, MD  fexofenadine (ALLEGRA) 180 MG tablet Take 180 mg by mouth daily as needed for allergies or rhinitis.    [provider]  melatonin 3 MG TABS tablet Take 1 tablet (3 mg total) by mouth at bedtime. 06/25/21   Regalado, Jon Billings A, MD  methylPREDNISolone (MEDROL DOSEPAK) 4 MG TBPK tablet follow package directions 11/04/21   Leroy Sea, MD  metoprolol tartrate (LOPRESSOR) 25 MG  tablet Take 0.5 tablets (12.5 mg total) by mouth 2 (two) times daily. 06/25/21   Regalado, Belkys A, MD  Multiple Vitamin (MULTIVITAMIN WITH MINERALS) TABS tablet Take 1 tablet by mouth daily. 06/25/21   Regalado, Belkys A, MD  polyethylene glycol (MIRALAX / GLYCOLAX) 17 g packet Take 17 g by mouth 2 (two) times daily. 06/25/21   Regalado, Belkys A, MD  senna-docusate (SENOKOT-S) 8.6-50 MG tablet Take 1 tablet by mouth 2 (two) times daily. 06/25/21   Regalado, Jon Billings A, MD      Allergies    Penicillins    Review of Systems   Review of Systems  Physical Exam Updated Vital Signs BP 130/71   Pulse 93   Temp 98.9 F (37.2 C) (Oral)   Resp 18   SpO2 95%  Physical Exam Constitutional: Alert and orientedxperson, place. Pleasant, NAD, nontoxic Eyes: Conjunctivae are normal. ENT      Head: Normocephalic and atraumatic.      Nose: No congestion.      Mouth/Throat: Mucous membranes are moist.      Neck: No midlinettp stepoff or deformity of C/T/L spine. Cardiovascular: S1, S2,  Normal and symmetric distal pulses are present in all extremities.Warm and well perfused. Respiratory: Normal respiratory effort. Breath sounds are normal. No chest wall ttp or deformity Gastrointestinal: Soft and nontender. There is no CVA tenderness. Musculoskeletal: Normal range of motion in all  extremities.      Right lower leg: No tenderness or edema.      Left lower leg: No tenderness or edema. Neurologic: Normal speech and language. Moving extremities x4. Sensation grossly intact. No facial droop.  No gross focal neurologic deficits are appreciated. Skin: Skin is warm, dry . Hemostatic abrasions left anterior knee and left elbow Psychiatric: Mood and affect are normal. Speech and behavior are normal.  ED Results / Procedures / Treatments   Labs (all labs ordered are listed, but only abnormal results are displayed) Labs Reviewed - No data to display  EKG None  Radiology DG Elbow Complete Left  Result  Date: 02/04/2023 CLINICAL DATA:  Fall from wheelchair EXAM: LEFT ELBOW - COMPLETE 4 VIEW COMPARISON:  None Available. FINDINGS: There is no evidence of fracture, dislocation, or joint effusion. Well corticated, curvilinear ossific fragment projecting along the medial epicondyle and osseous beaking along the sublime tubercle may reflect sequela of remote injury. Soft tissues are unremarkable. IMPRESSION: No acute fracture or dislocation. Electronically Signed   By: Agustin Cree M.D.   On: 02/04/2023 21:25   DG Knee 2 Views Left  Result Date: 02/04/2023 CLINICAL DATA:  Fall EXAM: LEFT KNEE - 1-2 VIEW COMPARISON:  05/29/2016 FINDINGS: Interval left total knee arthroplasty. Arthroplasty components are in expected alignment. Normal overall alignment. No acute fracture or dislocation. No effusion. Infiltration of Hoffa's fat may be postsurgical in nature. Mild degenerative enthesopathy involving the insertion of the quadriceps tendon upon the patella. Vascular calcifications are seen. IMPRESSION: 1. Interval left total knee arthroplasty. No acute fracture or dislocation. Electronically Signed   By: Helyn Numbers M.D.   On: 02/04/2023 21:24   DG Pelvis 1-2 Views  Result Date: 02/04/2023 CLINICAL DATA:  Fall. EXAM: PELVIS - 1-2 VIEW COMPARISON:  Pelvic radiograph dated 06/17/2021. FINDINGS: There is a right hip arthroplasty. The arthroplasty appears intact and in anatomic alignment. Curvilinear density along the greater trochanter of the right femur may be chronic. An acute fracture is not entirely excluded. The bones are osteopenic. There is no dislocation. Degenerative changes of the lower lumbar spine. The soft tissues are unremarkable. Calcified uterine fibroids over the pelvis. IMPRESSION: 1. Right hip arthroplasty. 2. Chronic calcification abutting the greater trochanter of the right femur versus an acute fracture. Clinical correlation is recommended. Electronically Signed   By: Elgie Collard M.D.   On:  02/04/2023 21:24   DG Chest 2 View  Result Date: 02/04/2023 CLINICAL DATA:  Fall from wheelchair EXAM: CHEST - 2 VIEW COMPARISON:  Chest radiograph dated 12/25/2021 FINDINGS: Normal lung volumes. No focal consolidations. No pleural effusion or pneumothorax. The heart size and mediastinal contours are within normal limits. No radiographic finding of acute displaced fracture. IMPRESSION: No radiographic finding of acute displaced fracture. Electronically Signed   By: Agustin Cree M.D.   On: 02/04/2023 21:22   CT Head Wo Contrast  Result Date: 02/04/2023 CLINICAL DATA:  fall EXAM: CT HEAD WITHOUT CONTRAST CT CERVICAL SPINE WITHOUT CONTRAST TECHNIQUE: Multidetector CT imaging of the head and cervical spine was performed following the standard protocol without intravenous contrast. Multiplanar CT image reconstructions of the cervical spine were also generated. RADIATION DOSE REDUCTION: This exam was performed according to the departmental dose-optimization program which includes automated exposure control, adjustment of the mA and/or kV according to patient size and/or use of iterative reconstruction technique. COMPARISON:  CT head and CT cervical spine December 25, 2021. FINDINGS: CT HEAD FINDINGS Brain: No evidence of acute infarction, hemorrhage,  hydrocephalus, extra-axial collection or mass lesion/mass effect. Remote infarcts in the cerebellum, basal ganglia, brainstem, and right frontal lobe. Additional patchy white matter hypodensities, nonspecific but compatible with chronic microvascular ischemic change. Vascular: No hyperdense vessel identified. Skull: No acute fracture. Sinuses/Orbits: Clear sinuses.  No acute orbital findings. Other: No mastoid effusions. CT CERVICAL SPINE FINDINGS Alignment: Normal. Skull base and vertebrae: Vertebral body heights are maintained. No evidence of acute fracture. Soft tissues and spinal canal: No prevertebral fluid or swelling. No visible canal hematoma. Disc levels:  Degenerative disc disease and facet/uncovertebral hypertrophy. Upper chest: Biapical pleuroparenchymal scarring. Other: Calcific atherosclerosis of the carotids. Heterogeneous thyroid with multiple nodules, measuring up to approximately 2.5 cm IMPRESSION: 1. No evidence of acute intracranial abnormality. Similar remote infarcts and chronic microvascular ischemic change. 2. No evidence of acute fracture or traumatic malalignment in the cervical spine. Similar degenerative changes. 3. Heterogeneous thyroid with multiple nodules, measuring up to approximately 2.5 cm. Recommend thyroid US (ref: J Am Coll Radiol. 2015 Feb;12(2): 143-50). Electronically Signed   By: Feliberto Harts M.D.   On: 02/04/2023 20:36   CT Cervical Spine Wo Contrast  Result Date: 02/04/2023 CLINICAL DATA:  fall EXAM: CT HEAD WITHOUT CONTRAST CT CERVICAL SPINE WITHOUT CONTRAST TECHNIQUE: Multidetector CT imaging of the head and cervical spine was performed following the standard protocol without intravenous contrast. Multiplanar CT image reconstructions of the cervical spine were also generated. RADIATION DOSE REDUCTION: This exam was performed according to the departmental dose-optimization program which includes automated exposure control, adjustment of the mA and/or kV according to patient size and/or use of iterative reconstruction technique. COMPARISON:  CT head and CT cervical spine December 25, 2021. FINDINGS: CT HEAD FINDINGS Brain: No evidence of acute infarction, hemorrhage, hydrocephalus, extra-axial collection or mass lesion/mass effect. Remote infarcts in the cerebellum, basal ganglia, brainstem, and right frontal lobe. Additional patchy white matter hypodensities, nonspecific but compatible with chronic microvascular ischemic change. Vascular: No hyperdense vessel identified. Skull: No acute fracture. Sinuses/Orbits: Clear sinuses.  No acute orbital findings. Other: No mastoid effusions. CT CERVICAL SPINE FINDINGS Alignment: Normal.  Skull base and vertebrae: Vertebral body heights are maintained. No evidence of acute fracture. Soft tissues and spinal canal: No prevertebral fluid or swelling. No visible canal hematoma. Disc levels: Degenerative disc disease and facet/uncovertebral hypertrophy. Upper chest: Biapical pleuroparenchymal scarring. Other: Calcific atherosclerosis of the carotids. Heterogeneous thyroid with multiple nodules, measuring up to approximately 2.5 cm IMPRESSION: 1. No evidence of acute intracranial abnormality. Similar remote infarcts and chronic microvascular ischemic change. 2. No evidence of acute fracture or traumatic malalignment in the cervical spine. Similar degenerative changes. 3. Heterogeneous thyroid with multiple nodules, measuring up to approximately 2.5 cm. Recommend thyroid US (ref: J Am Coll Radiol. 2015 Feb;12(2): 143-50). Electronically Signed   By: Feliberto Harts M.D.   On: 02/04/2023 20:36    Procedures Procedures    Medications Ordered in ED Medications  acetaminophen (TYLENOL) tablet 650 mg (650 mg Oral Given 02/04/23 2028)    ED Course/ Medical Decision Making/ A&P                             Medical Decision Making  Christine Christensen is a 77 y.o. female.  With PMH of HTN, OA, history of CVA on aspirin who presents from [facility] after fall to the floor while trying to transfer to chair.  This was a mechanical witnessed fall and she was not using her walker.  Patient had mechanical fall no LOC.  She is at neurologic baseline and known dementia.  CT head and C-spine without a contrast obtained as well as plain films of chest, pelvis, left elbow, left knee.  No acute traumatic injuries noted.  Incidental finding of thyroid lesions.  Patient discharged back to facility with information to follow-up with PCP regarding incidental thyroid findings.  Return precautions listed.  Amount and/or Complexity of Data Reviewed Radiology: ordered.  Risk OTC drugs.      / ED  Diagnoses Final diagnoses:  Fall, initial encounter  Multiple thyroid nodules    Rx / DC Orders ED Discharge Orders     None         Mardene Sayer, MD 02/05/23 1045

## 2023-02-04 NOTE — ED Triage Notes (Signed)
Pt presents from [facility] where she fell onto floor while trying to transfer to chair, fell onto L side. RN at facility states that patient hit head, witnessed. EMS notes abrasion to L elbow, pain in L hip and L knee No thinners No LOC. All distal PMS intact. EMS: 128/56, 96%, 98.5, 92  H/o vascular dementia

## 2023-02-05 NOTE — ED Notes (Signed)
RN attempted to call report to [facility] twice and did not get a person, went straight to voicemail.

## 2023-07-26 ENCOUNTER — Encounter: Payer: Self-pay | Admitting: Neurology

## 2023-07-26 ENCOUNTER — Ambulatory Visit (INDEPENDENT_AMBULATORY_CARE_PROVIDER_SITE_OTHER): Payer: Medicare Other | Admitting: Neurology

## 2023-07-26 VITALS — BP 116/67 | HR 76 | Resp 15 | Ht 64.0 in

## 2023-07-26 DIAGNOSIS — F02B0 Dementia in other diseases classified elsewhere, moderate, without behavioral disturbance, psychotic disturbance, mood disturbance, and anxiety: Secondary | ICD-10-CM | POA: Diagnosis not present

## 2023-07-26 DIAGNOSIS — G301 Alzheimer's disease with late onset: Secondary | ICD-10-CM

## 2023-07-26 NOTE — Progress Notes (Addendum)
[facility]  PATIENT: Christine Christensen DOB: 01/07/1946  REQUESTING CLINICIAN: Julian Hy, MD HISTORY FROM: Patient/Limited information on paper chart. REASON FOR VISIT: Dementia    HISTORICAL  CHIEF COMPLAINT:  Chief Complaint  Patient presents with   New Patient (Initial Visit)    Rm12, cna present , decline in dementia: mmse 16    HISTORY OF PRESENT ILLNESS:  This is a 77 year old woman with PMHx of dementia, anemia, heart disease who is presenting with her CNA with worsening or her memory. CNA who accompanied patient does not know her very well. Per chart review, patient has a history of  Dementia, currently not on any medications, and family got concerns because memory is getting worse but again, she is alone today, no family at bedside.  She tells me that she lives in a nursing home, she uses a cane, she does not cook, they bring her dinner to her room. She needs assistance with bathing, showering. Sometimes she participates in physical activity. She tells me that she has 2 sons, she can name them but cannot provide DOBs or age or their physical addresses.    Functional status: Dependent in all ADLs and IADLs Patient lives with [facility], SNF . Cooking: no Cleaning: no Shopping: no Bathing: needs help Toileting: needs help Driving: no Bills: no Medications: Given to her by medical staff Ever left the stove on by accident?: n/a Forget how to use items around the house?: yes Getting lost going to familiar places?: yes Forgetting loved ones names?: can tell mer her 2 sons names but not their DOB, address or their ages  Word finding difficulty? no Sleep: Good    OTHER MEDICAL CONDITIONS: Dementia, hypertension, Heart disease   REVIEW OF SYSTEMS: Full 14 system review of systems performed and negative with exception of: As noted in the HPI   ALLERGIES: Allergies  Allergen Reactions   Penicillins Other (See Comments)    Unknown  reaction    HOME MEDICATIONS: Outpatient Medications Prior to Visit  Medication Sig Dispense Refill   acetaminophen (TYLENOL) 500 MG tablet Take 2 tablets (1,000 mg total) by mouth every 6 (six) hours as needed for mild pain, moderate pain, fever or headache. 30 tablet 0   albuterol (VENTOLIN HFA) 108 (90 Base) MCG/ACT inhaler Inhale 2 puffs into the lungs every 4 (four) hours as needed for wheezing or shortness of breath.     aspirin 325 MG tablet Take 1 tablet (325 mg total) by mouth daily. 30 tablet 0   escitalopram (LEXAPRO) 20 MG tablet Take 20 mg by mouth every morning.     fexofenadine (ALLEGRA) 180 MG tablet Take 180 mg by mouth daily as needed for allergies or rhinitis.     melatonin 3 MG TABS tablet Take 1 tablet (3 mg total) by mouth at bedtime. 30 tablet 0   metoprolol tartrate (LOPRESSOR) 25 MG tablet Take 0.5 tablets (12.5 mg total) by mouth 2 (two) times daily. 60 tablet 0   Multiple Vitamin (MULTIVITAMIN WITH MINERALS) TABS tablet Take 1 tablet by mouth daily. 30 tablet 0   polyethylene glycol (MIRALAX / GLYCOLAX) 17 g packet Take 17 g by mouth 2 (two) times daily. 14 each 0   senna-docusate (SENOKOT-S) 8.6-50 MG tablet Take 1 tablet by mouth 2 (two) times daily. 30 tablet 0   traZODone (DESYREL) 50 MG tablet Take 50 mg by mouth at bedtime.     feeding supplement (ENSURE ENLIVE / ENSURE PLUS) LIQD  Take 237 mLs by mouth 2 (two) times daily between meals. 237 mL 12   methylPREDNISolone (MEDROL DOSEPAK) 4 MG TBPK tablet follow package directions 21 tablet 0   No facility-administered medications prior to visit.    PAST MEDICAL HISTORY: Past Medical History:  Diagnosis Date   Anemia    history of anemia   Aortic stenosis    mild aortic stenosis 07/27/17 echo   Arthritis    Heart murmur    Hypertension    Mitral stenosis    moderate mitral stenosis 07/27/17 echo   Stroke [facility])    stroke x2, 2005 and 2008, some right sided weakness   Syncope and collapse 10/25/2017    Urinary incontinence     PAST SURGICAL HISTORY: Past Surgical History:  Procedure Laterality Date   CATARACT EXTRACTION, BILATERAL Bilateral    CHOLECYSTECTOMY     HIP ARTHROPLASTY Right 06/14/2021   Procedure: ARTHROPLASTY BIPOLAR HIP (HEMIARTHROPLASTY);  Surgeon: Joen Laura, MD;  Location: [facility] OR;  Service: Orthopedics;  Laterality: Right;   TONSILLECTOMY     TOTAL KNEE ARTHROPLASTY Right    right knee 2006   TOTAL KNEE ARTHROPLASTY Left 08/06/2017   Procedure: LEFT TOTAL KNEE ARTHROPLASTY;  Surgeon: Dannielle Huh, MD;  Location: [facility] OR;  Service: Orthopedics;  Laterality: Left;   TUBAL LIGATION      FAMILY HISTORY: Family History  Problem Relation Age of Onset   Dementia Mother    Heart disease Father    Cerebral palsy Sister     SOCIAL HISTORY: Social History   Socioeconomic History   Marital status: Divorced    Spouse name: Not on file   Number of children: 2   Years of education: 14   Highest education level: Not on file  Occupational History   Not on file  Tobacco Use   Smoking status: Every Day    Current packs/day: 0.50    Average packs/day: 0.5 packs/day for 56.0 years (28.0 ttl pk-yrs)    Types: Cigarettes   Smokeless tobacco: Never  Vaping Use   Vaping status: Never Used  Substance and Sexual Activity   Alcohol use: Yes    Comment: Sometimes   Drug use: Yes    Types: Marijuana   Sexual activity: Not on file  Other Topics Concern   Not on file  Social History Narrative   Lives with son   Caffeine use: Drinks coffee 3 times weekly, Iced tea sometimes   Right handed    Social Determinants of Health   Financial Resource Strain: Not on file  Food Insecurity: Not on file  Transportation Needs: Not on file  Physical Activity: Not on file  Stress: Not on file  Social Connections: Not on file  Intimate Partner Violence: Not on file     PHYSICAL EXAM  GENERAL EXAM/CONSTITUTIONAL: Vitals:  Vitals:   07/26/23 1258  BP: 116/67  Pulse:  76  Resp: 15  Height: 5\' 4"  (1.626 m)   Body mass index is 25.75 kg/m. Wt Readings from Last 3 Encounters:  07/29/22 150 lb (68 kg)  01/18/22 146 lb 6 oz (66.4 kg)  12/25/21 133 lb 13.1 oz (60.7 kg)   Patient is in no distress; well developed, nourished and groomed; neck is supple  MUSCULOSKELETAL: Gait, strength, tone, movements noted in Neurologic exam below  NEUROLOGIC: MENTAL STATUS:     07/26/2023    1:01 PM  MMSE - Mini Mental State Exam  Orientation to time 2  Orientation to Place 4  Registration 3  Attention/ Calculation 1  Recall 0  Language- name 2 objects 2  Language- repeat 1  Language- follow 3 step command 1  Language- read & follow direction 1  Write a sentence 1  Copy design 0  Total score 16   awake, alert, able to follow commands  She is joyous, smiling, laughing    CRANIAL NERVE:  2nd, 3rd, 4th, 6th- visual fields full to confrontation, extraocular muscles intact, no nystagmus 5th - facial sensation symmetric 7th - facial strength symmetric 8th - hearing intact 9th - palate elevates symmetrically, uvula midline 11th - shoulder shrug symmetric 12th - tongue protrusion midline  MOTOR:  normal bulk and tone, at least antigravity   SENSORY:  normal and symmetric to light touch  COORDINATION:  finger-nose-finger normal.  GAIT/STATION:  Deferred     DIAGNOSTIC DATA (LABS, IMAGING, TESTING) - I reviewed patient records, labs, notes, testing and imaging myself where available.  Lab Results  Component Value Date   WBC 5.7 12/25/2021   HGB 11.4 (L) 12/25/2021   HCT 34.9 (L) 12/25/2021   MCV 91.4 12/25/2021   PLT 206 12/25/2021      Component Value Date/Time   NA 138 12/25/2021 1740   K 4.1 12/25/2021 1740   CL 104 12/25/2021 1740   CO2 28 12/25/2021 1740   GLUCOSE 115 (H) 12/25/2021 1740   BUN 9 12/25/2021 1740   CREATININE 0.86 12/25/2021 1740   CREATININE 0.75 03/03/2021 1053   CALCIUM 9.1 12/25/2021 1740   PROT 6.5  12/25/2021 1740   ALBUMIN 3.3 (L) 12/25/2021 1740   AST 14 (L) 12/25/2021 1740   ALT 8 12/25/2021 1740   ALKPHOS 46 12/25/2021 1740   BILITOT 0.5 12/25/2021 1740   GFRNONAA >60 12/25/2021 1740   GFRNONAA 78 03/03/2021 1053   GFRAA 90 03/03/2021 1053   No results found for: "CHOL", "HDL", "LDLCALC", "LDLDIRECT", "TRIG", "CHOLHDL" No results found for: "HGBA1C" Lab Results  Component Value Date   VITAMINB12 703 07/26/2023   Lab Results  Component Value Date   TSH 0.568 07/26/2023    Head CT 02/04/2023 1. No evidence of acute intracranial abnormality. Similar remote infarcts and chronic microvascular ischemic change.    ASSESSMENT AND PLAN  77 y.o. year old female with dementia, hypertension, heart disease who is presenting with worsening dementia. Family is not present to provide additional history. She does live in a nursing home, 16/30 on MMSE and needs assistance in activity of daily living. Patient likely has mild to moderate Alzheimer dementia. Will start by getting dementia labs including TSH, B12, ATN and ApoE4. Start Namenda 10 mg twice daily and Aricept 5 mg nightly. If patient is able to tolerate the Aricept, then we will recommend to increase it to 10 mg nightly.  Continue to follow up with PCP and return as needed    1. Moderate late onset Alzheimer's dementia without behavioral disturbance, psychotic disturbance, mood disturbance, or anxiety (HCC)      Patient Instructions  Will check Dementia lab including TSH, B12, ATN and ApoE4  Start Aricept 5 mg nightly, consider increasing to 10 mg nightly if able to tolerate medication  Start Namenda 10 gm twice daily  Continue to follow up with PCP  Return as needed   There are well-accepted and sensible ways to reduce risk for Alzheimers disease and other degenerative brain disorders .  Exercise Daily Walk A daily 20 minute walk should be part of your routine. Disease related apathy can be  a significant roadblock to  exercise and the only way to overcome this is to make it a daily routine and perhaps have a reward at the end (something your loved one loves to eat or drink perhaps) or a personal trainer coming to the home can also be very useful. Most importantly, the patient is much more likely to exercise if the caregiver / spouse does it with him/her. In general a structured, repetitive schedule is best.  General Health: Any diseases which effect your body will effect your brain such as a pneumonia, urinary infection, blood clot, heart attack or stroke. Keep contact with your primary care doctor for regular follow ups.  Sleep. A good nights sleep is healthy for the brain. Seven hours is recommended. If you have insomnia or poor sleep habits we can give you some instructions. If you have sleep apnea wear your mask.  Diet: Eating a heart healthy diet is also a good idea; fish and poultry instead of red meat, nuts (mostly non-peanuts), vegetables, fruits, olive oil or canola oil (instead of butter), minimal salt (use other spices to flavor foods), whole grain rice, bread, cereal and pasta and wine in moderation.Research is now showing that the MIND diet, which is a combination of The Mediterranean diet and the DASH diet, is beneficial for cognitive processing and longevity. Information about this diet can be found in The MIND Diet, a book by Alonna Minium, MS, RDN, and online at [employer]  Finances, Power of [address] and Advance Directives: You should consider putting legal safeguards in place with regard to financial and medical decision making. While the spouse always has power of attorney for medical and financial issues in the absence of any form, you should consider what you want in case the spouse / caregiver is no longer around or capable of making decisions.     Orders Placed This Encounter  Procedures   ATN PROFILE   APOE Alzheimer's Risk   TSH   Vitamin B12    No  orders of the defined types were placed in this encounter.   Return if symptoms worsen or fail to improve.    Windell Norfolk, MD 08/10/2023, 3:59 PM  [facility] [address] [place], [place] [address] [phone]

## 2023-07-26 NOTE — Patient Instructions (Addendum)
Will check Dementia lab including TSH, B12, ATN and ApoE4  Start Aricept 5 mg nightly, consider increasing to 10 mg nightly if able to tolerate medication  Start Namenda 10 gm twice daily  Continue to follow up with PCP  Return as needed   There are well-accepted and sensible ways to reduce risk for Alzheimers disease and other degenerative brain disorders .  Exercise Daily Walk A daily 20 minute walk should be part of your routine. Disease related apathy can be a significant roadblock to exercise and the only way to overcome this is to make it a daily routine and perhaps have a reward at the end (something your loved one loves to eat or drink perhaps) or a personal trainer coming to the home can also be very useful. Most importantly, the patient is much more likely to exercise if the caregiver / spouse does it with him/her. In general a structured, repetitive schedule is best.  General Health: Any diseases which effect your body will effect your brain such as a pneumonia, urinary infection, blood clot, heart attack or stroke. Keep contact with your primary care doctor for regular follow ups.  Sleep. A good nights sleep is healthy for the brain. Seven hours is recommended. If you have insomnia or poor sleep habits we can give you some instructions. If you have sleep apnea wear your mask.  Diet: Eating a heart healthy diet is also a good idea; fish and poultry instead of red meat, nuts (mostly non-peanuts), vegetables, fruits, olive oil or canola oil (instead of butter), minimal salt (use other spices to flavor foods), whole grain rice, bread, cereal and pasta and wine in moderation.Research is now showing that the MIND diet, which is a combination of The Mediterranean diet and the DASH diet, is beneficial for cognitive processing and longevity. Information about this diet can be found in The MIND Diet, a book by Alonna Minium, MS, RDN, and online at  [employer]  Finances, Power of [address] and Advance Directives: You should consider putting legal safeguards in place with regard to financial and medical decision making. While the spouse always has power of attorney for medical and financial issues in the absence of any form, you should consider what you want in case the spouse / caregiver is no longer around or capable of making decisions.

## 2023-08-08 LAB — APOE ALZHEIMER'S RISK

## 2023-08-08 LAB — ATN PROFILE
A -- Beta-amyloid 42/40 Ratio: 0.111 (ref >0.102)
Beta-amyloid 40: 211.93 pg/mL
Beta-amyloid 42: 23.56 pg/mL
N -- NfL, Plasma: 8.23 pg/mL — ABNORMAL HIGH (ref 0.00–7.64)
T -- p-tau181: 1.16 pg/mL — ABNORMAL HIGH (ref 0.00–0.97)

## 2023-08-08 LAB — VITAMIN B12: Vitamin B-12: 703 pg/mL (ref 232–1245)

## 2023-08-08 LAB — TSH: TSH: 0.568 u[IU]/mL (ref 0.450–4.500)

## 2023-08-10 NOTE — Progress Notes (Signed)
Please call and advise the patient/family that her dementia labs were not consistent with Alzheimer Dementia pathology. She has another form of Dementia, possibly Vascular dementia. Please continue current medication and please remind patient to keep any upcoming appointments or tests and to call us with any interim questions, concerns, problems or updates. Thanks,   Windell Norfolk, MD

## 2023-08-13 ENCOUNTER — Telehealth: Payer: Self-pay

## 2023-08-13 NOTE — Telephone Encounter (Signed)
Unable to leave msg mailbox full 1st attempt

## 2023-08-13 NOTE — Telephone Encounter (Signed)
-----   Message from [facility] sent at 08/10/2023  4:00 PM EST ----- Please call and advise the patient/family that her dementia labs were not consistent with Alzheimer Dementia pathology. She has another form of Dementia, possibly Vascular dementia. Please continue current medication and please remind patient to keep any upcoming appointments or tests and to call us with any interim questions, concerns, problems or updates. Thanks,   Windell Norfolk, MD

## 2023-08-14 NOTE — Telephone Encounter (Signed)
Eather Colas, CMA 08/14/2023 10:00 AM EST Back to Top    Patient brother called.  Patient brother aware.

## 2023-08-28 ENCOUNTER — Other Ambulatory Visit: Payer: Self-pay

## 2023-08-28 DIAGNOSIS — E079 Disorder of thyroid, unspecified: Secondary | ICD-10-CM

## 2023-09-13 ENCOUNTER — Ambulatory Visit
Admission: RE | Admit: 2023-09-13 | Discharge: 2023-09-13 | Disposition: A | Discharge status: Home / Self Care | Payer: Medicare Other | Source: Ambulatory Visit

## 2023-09-13 DIAGNOSIS — E079 Disorder of thyroid, unspecified: Secondary | ICD-10-CM

## 2023-09-13 MED ORDER — IOPAMIDOL (ISOVUE-300) INJECTION 61%
500.0000 mL | Freq: Once | INTRAVENOUS | Status: AC | PRN
  Administered 2023-09-13: 75 mL via INTRAVENOUS

## 2023-10-12 ENCOUNTER — Encounter (HOSPITAL_COMMUNITY): Payer: Self-pay | Admitting: *Deleted

## 2023-10-12 ENCOUNTER — Observation Stay (HOSPITAL_COMMUNITY)
Admission: EM | Admit: 2023-10-12 | Discharge: 2023-10-14 | Disposition: A | Discharge status: Skilled Nursing Facility | Payer: Medicare Other | Attending: Internal Medicine | Admitting: Internal Medicine

## 2023-10-12 ENCOUNTER — Other Ambulatory Visit: Payer: Self-pay

## 2023-10-12 ENCOUNTER — Observation Stay (HOSPITAL_COMMUNITY): Payer: Medicare Other

## 2023-10-12 ENCOUNTER — Emergency Department (HOSPITAL_COMMUNITY): Payer: Medicare Other

## 2023-10-12 DIAGNOSIS — I5032 Chronic diastolic (congestive) heart failure: Secondary | ICD-10-CM | POA: Insufficient documentation

## 2023-10-12 DIAGNOSIS — R404 Transient alteration of awareness: Principal | ICD-10-CM

## 2023-10-12 DIAGNOSIS — Z7982 Long term (current) use of aspirin: Secondary | ICD-10-CM | POA: Diagnosis not present

## 2023-10-12 DIAGNOSIS — Z96651 Presence of right artificial knee joint: Secondary | ICD-10-CM | POA: Insufficient documentation

## 2023-10-12 DIAGNOSIS — N390 Urinary tract infection, site not specified: Secondary | ICD-10-CM

## 2023-10-12 DIAGNOSIS — R4701 Aphasia: Secondary | ICD-10-CM | POA: Diagnosis not present

## 2023-10-12 DIAGNOSIS — I110 Hypertensive heart disease with heart failure: Secondary | ICD-10-CM | POA: Diagnosis not present

## 2023-10-12 DIAGNOSIS — Z79899 Other long term (current) drug therapy: Secondary | ICD-10-CM | POA: Diagnosis not present

## 2023-10-12 DIAGNOSIS — R296 Repeated falls: Secondary | ICD-10-CM | POA: Insufficient documentation

## 2023-10-12 DIAGNOSIS — Z87891 Personal history of nicotine dependence: Secondary | ICD-10-CM | POA: Insufficient documentation

## 2023-10-12 DIAGNOSIS — R4182 Altered mental status, unspecified: Secondary | ICD-10-CM | POA: Diagnosis present

## 2023-10-12 DIAGNOSIS — Z96641 Presence of right artificial hip joint: Secondary | ICD-10-CM | POA: Diagnosis not present

## 2023-10-12 DIAGNOSIS — F0390 Unspecified dementia without behavioral disturbance: Secondary | ICD-10-CM | POA: Insufficient documentation

## 2023-10-12 DIAGNOSIS — Z8673 Personal history of transient ischemic attack (TIA), and cerebral infarction without residual deficits: Secondary | ICD-10-CM | POA: Diagnosis not present

## 2023-10-12 LAB — URINALYSIS, ROUTINE W REFLEX MICROSCOPIC
Bilirubin Urine: NEGATIVE
Glucose, UA: NEGATIVE mg/dL
Hgb urine dipstick: NEGATIVE
Ketones, ur: NEGATIVE mg/dL
Nitrite: POSITIVE — AB
Protein, ur: NEGATIVE mg/dL
Specific Gravity, Urine: 1.018 (ref 1.005–1.030)
pH: 6 (ref 5.0–8.0)

## 2023-10-12 LAB — GLUCOSE, CAPILLARY: Glucose-Capillary: 98 mg/dL (ref 70–99)

## 2023-10-12 LAB — CBC WITH DIFFERENTIAL/PLATELET
Abs Immature Granulocytes: 0.02 10*3/uL (ref 0.00–0.07)
Basophils Absolute: 0.1 10*3/uL (ref 0.0–0.1)
Basophils Relative: 1 %
Eosinophils Absolute: 0.1 10*3/uL (ref 0.0–0.5)
Eosinophils Relative: 2 %
HCT: 36.4 % (ref 36.0–46.0)
Hemoglobin: 12.1 g/dL (ref 12.0–15.0)
Immature Granulocytes: 0 %
Lymphocytes Relative: 30 %
Lymphs Abs: 1.8 10*3/uL (ref 0.7–4.0)
MCH: 28.9 pg (ref 26.0–34.0)
MCHC: 33.2 g/dL (ref 30.0–36.0)
MCV: 86.9 fL (ref 80.0–100.0)
Monocytes Absolute: 0.5 10*3/uL (ref 0.1–1.0)
Monocytes Relative: 7 %
Neutro Abs: 3.6 10*3/uL (ref 1.7–7.7)
Neutrophils Relative %: 60 %
Platelets: 294 10*3/uL (ref 150–400)
RBC: 4.19 MIL/uL (ref 3.87–5.11)
RDW: 15.5 % (ref 11.5–15.5)
WBC: 6.1 10*3/uL (ref 4.0–10.5)
nRBC: 0 % (ref 0.0–0.2)

## 2023-10-12 LAB — COMPREHENSIVE METABOLIC PANEL
ALT: 15 U/L (ref 0–44)
AST: 21 U/L (ref 15–41)
Albumin: 3.2 g/dL — ABNORMAL LOW (ref 3.5–5.0)
Alkaline Phosphatase: 54 U/L (ref 38–126)
Anion gap: 11 (ref 5–15)
BUN: 12 mg/dL (ref 8–23)
CO2: 25 mmol/L (ref 22–32)
Calcium: 9.3 mg/dL (ref 8.9–10.3)
Chloride: 103 mmol/L (ref 98–111)
Creatinine, Ser: 0.74 mg/dL (ref 0.44–1.00)
GFR, Estimated: >60 mL/min (ref >60)
Glucose, Bld: 83 mg/dL (ref 70–99)
Potassium: 3.8 mmol/L (ref 3.5–5.1)
Sodium: 139 mmol/L (ref 135–145)
Total Bilirubin: 0.3 mg/dL (ref 0.0–1.2)
Total Protein: 6.8 g/dL (ref 6.5–8.1)

## 2023-10-12 LAB — HEMOGLOBIN A1C
Hgb A1c MFr Bld: 5.8 % — ABNORMAL HIGH (ref 4.8–5.6)
Mean Plasma Glucose: 119.76 mg/dL

## 2023-10-12 LAB — CBC
HCT: 37.2 % (ref 36.0–46.0)
Hemoglobin: 12.2 g/dL (ref 12.0–15.0)
MCH: 28.3 pg (ref 26.0–34.0)
MCHC: 32.8 g/dL (ref 30.0–36.0)
MCV: 86.3 fL (ref 80.0–100.0)
Platelets: 278 10*3/uL (ref 150–400)
RBC: 4.31 MIL/uL (ref 3.87–5.11)
RDW: 15.2 % (ref 11.5–15.5)
WBC: 6.9 10*3/uL (ref 4.0–10.5)
nRBC: 0 % (ref 0.0–0.2)

## 2023-10-12 LAB — TSH: TSH: 0.651 u[IU]/mL (ref 0.350–4.500)

## 2023-10-12 LAB — CREATININE, SERUM
Creatinine, Ser: 0.73 mg/dL (ref 0.44–1.00)
GFR, Estimated: >60 mL/min (ref >60)

## 2023-10-12 LAB — TROPONIN I (HIGH SENSITIVITY): Troponin I (High Sensitivity): 8 ng/L (ref <18)

## 2023-10-12 MED ORDER — SODIUM CHLORIDE 0.9 % IV SOLN
1.0000 g | Freq: Once | INTRAVENOUS | Status: AC
  Administered 2023-10-12: 1 g via INTRAVENOUS
  Filled 2023-10-12: qty 10

## 2023-10-12 MED ORDER — SODIUM CHLORIDE 0.9% FLUSH
3.0000 mL | Freq: Two times a day (BID) | INTRAVENOUS | Status: DC
  Administered 2023-10-12 – 2023-10-13 (×3): 3 mL via INTRAVENOUS

## 2023-10-12 MED ORDER — ACETAMINOPHEN 325 MG PO TABS
650.0000 mg | ORAL_TABLET | Freq: Four times a day (QID) | ORAL | Status: DC | PRN
Start: 2023-10-12 — End: 2023-10-14
  Administered 2023-10-13: 650 mg via ORAL

## 2023-10-12 MED ORDER — LORAZEPAM 1 MG PO TABS: 1.0000 mg | ORAL_TABLET | Freq: Once | ORAL | Status: DC | PRN

## 2023-10-12 MED ORDER — SODIUM CHLORIDE 0.9 % IV SOLN
1.0000 g | INTRAVENOUS | Status: DC
  Administered 2023-10-13: 1 g via INTRAVENOUS
  Filled 2023-10-12: qty 10

## 2023-10-12 MED ORDER — ENOXAPARIN SODIUM 40 MG/0.4ML IJ SOSY
40.0000 mg | PREFILLED_SYRINGE | INTRAMUSCULAR | Status: DC
  Administered 2023-10-13 – 2023-10-14 (×2): 40 mg via SUBCUTANEOUS
  Filled 2023-10-12 (×2): qty 0.4

## 2023-10-12 MED ORDER — ACETAMINOPHEN 650 MG RE SUPP: 650.0000 mg | Freq: Four times a day (QID) | RECTAL | Status: DC | PRN

## 2023-10-12 MED ORDER — LABETALOL HCL 5 MG/ML IV SOLN: 20.0000 mg | INTRAVENOUS | Status: DC | PRN

## 2023-10-12 MED ORDER — INSULIN ASPART 100 UNIT/ML IJ SOLN: 0.0000 [IU] | Freq: Every day | INTRAMUSCULAR | Status: DC

## 2023-10-12 MED ORDER — POLYETHYLENE GLYCOL 3350 17 G PO PACK: 17.0000 g | PACK | Freq: Every day | ORAL | Status: DC | PRN

## 2023-10-12 MED ORDER — THIAMINE HCL 100 MG/ML IJ SOLN
100.0000 mg | Freq: Every day | INTRAMUSCULAR | Status: DC
  Administered 2023-10-12 – 2023-10-13 (×2): 100 mg via INTRAVENOUS
  Filled 2023-10-12 (×3): qty 2

## 2023-10-12 MED ORDER — INSULIN ASPART 100 UNIT/ML IJ SOLN: 0.0000 [IU] | Freq: Three times a day (TID) | INTRAMUSCULAR | Status: DC

## 2023-10-12 NOTE — ED Notes (Signed)
Patient transported to MRI

## 2023-10-12 NOTE — ED Provider Notes (Signed)
Patient received in signout.  Pending lab work and UA.  CT without concern. Physical Exam  BP 128/81   Pulse (!) 117   Temp 98.1 F (36.7 C) (Oral)   Resp 17   Ht 5\' 4"  (1.626 m)   Wt 68 kg   SpO2 99%   BMI 25.73 kg/m   Physical Exam  Procedures  Procedures  ED Course / MDM   Clinical Course as of 10/12/23 1851  Fri Oct 12, 2023  1230 Patient to ED from nursing home [facility]). Unwitnessed fall last night, not anticoagulated. Today with altered mental.  Discussed with facility staff who was familiar with this patient. They report usually quite verbal. Can transfer to wheelchair with minimal assist and pushes herself around for mobility. No report fever or recent illness. No vomiting or diarrhea. No cough or SOB.  [SU]  1412 Delay in lab collection. Head CT without acute changes.  [SU]  1532 Recheck - the patient is much more awake, now verbal, clear speech. No complaints. Asking for nurse. Labs pending at time of sign out at end of shift to oncoming provider team. Feel with improvement in mentation, neg head CT, facility resident, if labs are normal, likely discharge back to facility.  [SU]  1546 CT Head Wo Contrast [AA]    Clinical Course User Index [AA] Marita Kansas, PA-C [SU] Elpidio Anis, PA-C   Medical Decision Making Amount and/or Complexity of Data Reviewed Labs: ordered. Radiology: ordered. Decision-making details documented in ED Course.  Risk Decision regarding hospitalization.   CBC unremarkable, CMP without acute concern.  UA does show evidence of UTI.  Given altered mental status in setting of UTI will discuss with hospitalist for admission.  Rocephin ordered.  Discussed with hospitalist.  He will evaluate patient for admission.       Marita Kansas, PA-C 10/12/23 Carlis Stable    Jacalyn Lefevre, MD 10/12/23 2300

## 2023-10-12 NOTE — Assessment & Plan Note (Addendum)
No fever no white count.  Some leukocytes in the urine. NO  Culture pending.  Continue with ceftriaxone started in the ER.

## 2023-10-12 NOTE — Assessment & Plan Note (Addendum)
See HPI for full description of presentation.  Patient is having cognitive decline today which has slightly fluctuated.  No fever no nuchal rigidity.  Patient looking nontoxic.  Will get an MRI to rule out stroke.  Will get a swallow screen till then.  We will hold off on further stroke workup till we get the MRI.  Ativan ordered for before MRI. Monitor glucose levels. Cehck b12, tsh. Empiric thiamine will be ordered.  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 Most apparent in RLE on exam.

## 2023-10-12 NOTE — H&P (Signed)
History and Physical    Patient: Christine Christensen ZOX:[id number] DOB: 1945/10/05 DOA: 10/12/2023 DOS: the patient was seen and examined on 10/12/2023 PCP: Fleet Contras, MD  Patient coming from:  SNF  Chief Complaint:  Chief Complaint  Patient presents with   Altered Mental Status   HPI: History is obtained primarily from record review as well as from the signout provided by ER provider.  Patient is basically asymptomatic and not offering any complaints   Christine Christensen is a 78 y.o. female with medical history significant of residence in [place].  Patient apparently has a chronic right lower extremity weakness as well as generalized weakness and gait abnormality and often uses a wheelchair.  Patient also has a documented vascular dementia on her admission record with mild severity with agitation.  She also has documented cognitive communication deficit.  She has had prior cerebrovascular disease.  As well as mitral stenosis.  Per her report obtained from the ER provider it seems that the patient is generally alert awake and fully oriented.  Further I am told that today patient was not talking appropriately at the staff at the SNF and therefore EMS was called.  There seems to be no further clarification about what exactly transpired at the nursing home.  Here in the ER patient has been evaluated by 2 of our ER providers and it is noted that the patient has been having a fluctuating mental status course.  Occasionally interacting appropriately and with good insight, at other times not interacting much or talking in a disconnected fashion.  Patient has been felt to have urinary tract infection in the ER and has been started on antibiotics.  Medical evaluation is sought.  During the encounter with the author, patient is not interacting much.  However does not offer any complaints.  She denies any pain shortness of breath or any distress.  Review of  Systems: unable to review all systems due to the inability of the patient to answer questions. Past Medical History:  Diagnosis Date   Anemia    history of anemia   Aortic stenosis    mild aortic stenosis 07/27/17 echo   Arthritis    Heart murmur    Hypertension    Mitral stenosis    moderate mitral stenosis 07/27/17 echo   Stroke [facility])    stroke x2, 2005 and 2008, some right sided weakness   Syncope and collapse 10/25/2017   Urinary incontinence    Past Surgical History:  Procedure Laterality Date   CATARACT EXTRACTION, BILATERAL Bilateral    CHOLECYSTECTOMY     HIP ARTHROPLASTY Right 06/14/2021   Procedure: ARTHROPLASTY BIPOLAR HIP (HEMIARTHROPLASTY);  Surgeon: Joen Laura, MD;  Location: [facility] OR;  Service: Orthopedics;  Laterality: Right;   TONSILLECTOMY     TOTAL KNEE ARTHROPLASTY Right    right knee 2006   TOTAL KNEE ARTHROPLASTY Left 08/06/2017   Procedure: LEFT TOTAL KNEE ARTHROPLASTY;  Surgeon: Dannielle Huh, MD;  Location: [facility] OR;  Service: Orthopedics;  Laterality: Left;   TUBAL LIGATION     Social History:  reports that she has been smoking cigarettes. She has a 28 pack-year smoking history. She has never used smokeless tobacco. She reports current alcohol use. She reports current drug use. Drug: Marijuana.  Allergies  Allergen Reactions   Penicillins Other (See Comments)    Unknown reaction    Family History  Problem Relation Age of Onset   Dementia Mother  Heart disease Father    Cerebral palsy Sister     Prior to Admission medications   Medication Sig Start Date End Date Taking? Authorizing Provider  acetaminophen (TYLENOL) 500 MG tablet Take 2 tablets (1,000 mg total) by mouth every 6 (six) hours as needed for mild pain, moderate pain, fever or headache. 06/25/21   Regalado, Belkys A, MD  albuterol (VENTOLIN HFA) 108 (90 Base) MCG/ACT inhaler Inhale 2 puffs into the lungs every 4 (four) hours as needed for wheezing or shortness of breath. 11/04/21    Leroy Sea, MD  aspirin 325 MG tablet Take 1 tablet (325 mg total) by mouth daily. 06/25/21   Regalado, Belkys A, MD  escitalopram (LEXAPRO) 20 MG tablet Take 20 mg by mouth every morning.    [provider]  fexofenadine (ALLEGRA) 180 MG tablet Take 180 mg by mouth daily as needed for allergies or rhinitis.    [provider]  melatonin 3 MG TABS tablet Take 1 tablet (3 mg total) by mouth at bedtime. 06/25/21   Regalado, Belkys A, MD  metoprolol tartrate (LOPRESSOR) 25 MG tablet Take 0.5 tablets (12.5 mg total) by mouth 2 (two) times daily. 06/25/21   Regalado, Belkys A, MD  Multiple Vitamin (MULTIVITAMIN WITH MINERALS) TABS tablet Take 1 tablet by mouth daily. 06/25/21   Regalado, Belkys A, MD  polyethylene glycol (MIRALAX / GLYCOLAX) 17 g packet Take 17 g by mouth 2 (two) times daily. 06/25/21   Regalado, Belkys A, MD  senna-docusate (SENOKOT-S) 8.6-50 MG tablet Take 1 tablet by mouth 2 (two) times daily. 06/25/21   Regalado, Belkys A, MD  traZODone (DESYREL) 50 MG tablet Take 50 mg by mouth at bedtime. 07/11/23   [provider]    Physical Exam: Vitals:   10/12/23 1500 10/12/23 1632 10/12/23 1645 10/12/23 1931  BP: 128/81   (!) 140/101  Pulse: 65  (!) 117 95  Resp: 17   15  Temp:  98.1 F (36.7 C)  98 F (36.7 C)  TempSrc:  Oral  Oral  SpO2: 99%  99% 98%  Weight:      Height:       General: Patient was sleeping when initially encountered.  Patient did not appear to be in distress Patient interacting briefly.  However not able to tell where she is at or why she is here at [facility], ER.  I attempted to reorient the patient.  Patient went back to sleeping.  Soon after the encounter ended Respiratory exam: Bilateral intravesicular Cardiovascular exam S1-S2 normal Abdomen all quadrant soft nontender Extremities warm without edema.  I was able to appreciate right lower extremity weakness and some equinus versus interning of the right foot.  My understanding  is that this is chronic Data Reviewed:  Labs on Admission:  Results for orders placed or performed during the hospital encounter of 10/12/23 (from the past 24 hours)  Urinalysis, Routine w reflex microscopic -Urine, Clean Catch     Status: Abnormal   Collection Time: 10/12/23 12:26 PM  Result Value Ref Range   Color, Urine YELLOW YELLOW   APPearance HAZY (A) CLEAR   Specific Gravity, Urine 1.018 1.005 - 1.030   pH 6.0 5.0 - 8.0   Glucose, UA NEGATIVE NEGATIVE mg/dL   Hgb urine dipstick NEGATIVE NEGATIVE   Bilirubin Urine NEGATIVE NEGATIVE   Ketones, ur NEGATIVE NEGATIVE mg/dL   Protein, ur NEGATIVE NEGATIVE mg/dL   Nitrite POSITIVE (A) NEGATIVE   Leukocytes,Ua SMALL (A) NEGATIVE  RBC / HPF 0-5 0 - 5 RBC/hpf   WBC, UA 21-50 0 - 5 WBC/hpf   Bacteria, UA MANY (A) NONE SEEN   Squamous Epithelial / HPF 0-5 0 - 5 /HPF   Mucus PRESENT   CBC with Differential     Status: None   Collection Time: 10/12/23  3:08 PM  Result Value Ref Range   WBC 6.1 4.0 - 10.5 K/uL   RBC 4.19 3.87 - 5.11 MIL/uL   Hemoglobin 12.1 12.0 - 15.0 g/dL   HCT 16.1 09.6 - 04.5 %   MCV 86.9 80.0 - 100.0 fL   MCH 28.9 26.0 - 34.0 pg   MCHC 33.2 30.0 - 36.0 g/dL   RDW 40.9 81.1 - 91.4 %   Platelets 294 150 - 400 K/uL   nRBC 0.0 0.0 - 0.2 %   Neutrophils Relative % 60 %   Neutro Abs 3.6 1.7 - 7.7 K/uL   Lymphocytes Relative 30 %   Lymphs Abs 1.8 0.7 - 4.0 K/uL   Monocytes Relative 7 %   Monocytes Absolute 0.5 0.1 - 1.0 K/uL   Eosinophils Relative 2 %   Eosinophils Absolute 0.1 0.0 - 0.5 K/uL   Basophils Relative 1 %   Basophils Absolute 0.1 0.0 - 0.1 K/uL   Immature Granulocytes 0 %   Abs Immature Granulocytes 0.02 0.00 - 0.07 K/uL  Comprehensive metabolic panel     Status: Abnormal   Collection Time: 10/12/23  3:08 PM  Result Value Ref Range   Sodium 139 135 - 145 mmol/L   Potassium 3.8 3.5 - 5.1 mmol/L   Chloride 103 98 - 111 mmol/L   CO2 25 22 - 32 mmol/L   Glucose, Bld 83 70 - 99 mg/dL   BUN 12 8  - 23 mg/dL   Creatinine, Ser 7.82 0.44 - 1.00 mg/dL   Calcium 9.3 8.9 - 95.6 mg/dL   Total Protein 6.8 6.5 - 8.1 g/dL   Albumin 3.2 (L) 3.5 - 5.0 g/dL   AST 21 15 - 41 U/L   ALT 15 0 - 44 U/L   Alkaline Phosphatase 54 38 - 126 U/L   Total Bilirubin 0.3 0.0 - 1.2 mg/dL   GFR, Estimated >21 >30 mL/min   Anion gap 11 5 - 15   Basic Metabolic Panel: Recent Labs  Lab 10/12/23 1508  NA 139  K 3.8  CL 103  CO2 25  GLUCOSE 83  BUN 12  CREATININE 0.74  CALCIUM 9.3   Liver Function Tests: Recent Labs  Lab 10/12/23 1508  AST 21  ALT 15  ALKPHOS 54  BILITOT 0.3  PROT 6.8  ALBUMIN 3.2*   No results for input(s): "LIPASE", "AMYLASE" in the last 168 hours. No results for input(s): "AMMONIA" in the last 168 hours. CBC: Recent Labs  Lab 10/12/23 1508  WBC 6.1  NEUTROABS 3.6  HGB 12.1  HCT 36.4  MCV 86.9  PLT 294   Cardiac Enzymes: No results for input(s): "CKTOTAL", "CKMB", "CKMBINDEX", "TROPONINIHS" in the last 168 hours.  BNP (last 3 results) No results for input(s): "PROBNP" in the last 8760 hours. CBG: No results for input(s): "GLUCAP" in the last 168 hours.  Radiological Exams on Admission:  CT Head Wo Contrast Result Date: 10/12/2023 CLINICAL DATA:  Head trauma, altered mental status. EXAM: CT HEAD WITHOUT CONTRAST TECHNIQUE: Contiguous axial images were obtained from the base of the skull through the vertex without intravenous contrast. RADIATION DOSE REDUCTION: This exam was performed according  to the departmental dose-optimization program which includes automated exposure control, adjustment of the mA and/or kV according to patient size and/or use of iterative reconstruction technique. COMPARISON:  02/04/2023 FINDINGS: Brain: No evidence of acute infarction, hemorrhage, mass, mass effect, or midline shift. No hydrocephalus or extra-axial fluid collection. Periventricular white matter changes, likely the sequela of chronic small vessel ischemic disease. Age related  cerebral atrophy. Remote lacunar infarct in the basal ganglia, cerebellum, right frontal lobe, and brainstem. Vascular: No hyperdense vessel. No hyperdense vessel. Atherosclerotic calcifications in the intracranial carotid and vertebral arteries. Skull: Negative for fracture or focal lesion. Hyperostosis frontalis. Sinuses/Orbits: No acute finding. Other: The mastoid air cells are well aerated. IMPRESSION: No acute intracranial process. Electronically Signed   By: Wiliam Ke M.D.   On: 10/12/2023 13:09     No intake/output data recorded. Total I/O In: 100 [IV Piggyback:100] Out: -       Assessment and Plan: * Aphasia See HPI for full description of presentation.  Patient is having cognitive decline today which has slightly fluctuated.  No fever no nuchal rigidity.  Patient looking nontoxic.  Will get an MRI to rule out stroke.  Will get a swallow screen till then.  We will hold off on further stroke workup till we get the MRI.  Ativan ordered for before MRI.  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 Most apparent in RLE on exam.  UTI (urinary tract infection) No fever no white count.  Some leukocytes in the urine. NO  Culture pending.  Continue with ceftriaxone started in the ER.  DVT PPX ordered. CXR pending Tropoin pending. Home med rec pending pharmcy input.   Advance Care Planning:   Code Status: Full Code per prior documentation/default.  Consults: none at this time.  Family Communication:   Severity of Illness: The appropriate patient status for this patient is OBSERVATION. Observation status is judged to be reasonable and necessary in order to provide the required intensity of service to ensure the patient's safety. The patient's presenting symptoms, physical exam findings, and initial radiographic and laboratory data in the context of their medical condition is felt to place them at decreased risk for further clinical deterioration. Furthermore, it is  anticipated that the patient will be medically stable for discharge from the hospital within 2 midnights of admission.   Author: Nolberto Hanlon, MD 10/12/2023 7:47 PM  For on call review [email or web address].

## 2023-10-12 NOTE — ED Provider Notes (Signed)
[place] EMERGENCY DEPARTMENT AT [facility] Provider Note   CSN: [id number] Arrival date & time: 10/12/23  1209     History  Chief Complaint  Patient presents with   Altered Mental Status    Christine Christensen is a 78 y.o. female.  Patient to ED from [facility] reporting unwitnessed fall last night at the facility. This morning the patient is reported to have altered mental status from baseline. On interview, the patient is responsive, answers "yes" and "no" by head movement but is nonverbal. Appears in NAD.     The history is provided by the EMS personnel and the nursing home.  Altered Mental Status      Home Medications Prior to Admission medications   Medication Sig Start Date End Date Taking? Authorizing Provider  acetaminophen (TYLENOL) 500 MG tablet Take 2 tablets (1,000 mg total) by mouth every 6 (six) hours as needed for mild pain, moderate pain, fever or headache. 06/25/21   Regalado, Belkys A, MD  albuterol (VENTOLIN HFA) 108 (90 Base) MCG/ACT inhaler Inhale 2 puffs into the lungs every 4 (four) hours as needed for wheezing or shortness of breath. 11/04/21   Leroy Sea, MD  aspirin 325 MG tablet Take 1 tablet (325 mg total) by mouth daily. 06/25/21   Regalado, Belkys A, MD  escitalopram (LEXAPRO) 20 MG tablet Take 20 mg by mouth every morning.    [provider]  fexofenadine (ALLEGRA) 180 MG tablet Take 180 mg by mouth daily as needed for allergies or rhinitis.    [provider]  melatonin 3 MG TABS tablet Take 1 tablet (3 mg total) by mouth at bedtime. 06/25/21   Regalado, Belkys A, MD  metoprolol tartrate (LOPRESSOR) 25 MG tablet Take 0.5 tablets (12.5 mg total) by mouth 2 (two) times daily. 06/25/21   Regalado, Belkys A, MD  Multiple Vitamin (MULTIVITAMIN WITH MINERALS) TABS tablet Take 1 tablet by mouth daily. 06/25/21   Regalado, Belkys A, MD  polyethylene glycol (MIRALAX / GLYCOLAX) 17 g packet Take 17 g by mouth 2 (two) times  daily. 06/25/21   Regalado, Belkys A, MD  senna-docusate (SENOKOT-S) 8.6-50 MG tablet Take 1 tablet by mouth 2 (two) times daily. 06/25/21   Regalado, Belkys A, MD  traZODone (DESYREL) 50 MG tablet Take 50 mg by mouth at bedtime. 07/11/23   [provider]      Allergies    Penicillins    Review of Systems   Review of Systems  Physical Exam Updated Vital Signs BP 128/81   Pulse 65   Temp 98.1 F (36.7 C) (Oral)   Resp 17   Ht 5\' 4"  (1.626 m)   Wt 68 kg   SpO2 99%   BMI 25.73 kg/m  Physical Exam Vitals and nursing note reviewed.  Constitutional:      Appearance: She is well-developed.  HENT:     Head: Normocephalic and atraumatic.  Eyes:     Pupils: Pupils are equal, round, and reactive to light.  Cardiovascular:     Rate and Rhythm: Normal rate and regular rhythm.  Pulmonary:     Effort: Pulmonary effort is normal.  Abdominal:     General: There is no distension.     Palpations: Abdomen is soft.     Tenderness: There is no abdominal tenderness.  Musculoskeletal:        General: Normal range of motion.     Cervical back: Normal range of motion and neck supple.  Skin:    General: Skin is warm and dry.  Neurological:     Mental Status: She is alert.     Comments: Responds to voice with eye opening Acknowledges yes and no by head shaking but nonverbal Follows command No gross neurologic deficits of extremity movement, facial droop.     ED Results / Procedures / Treatments   Labs (all labs ordered are listed, but only abnormal results are displayed) Labs Reviewed  CBC WITH DIFFERENTIAL/PLATELET  COMPREHENSIVE METABOLIC PANEL  URINALYSIS, ROUTINE W REFLEX MICROSCOPIC    EKG None  Radiology CT Head Wo Contrast Result Date: 10/12/2023 CLINICAL DATA:  Head trauma, altered mental status. EXAM: CT HEAD WITHOUT CONTRAST TECHNIQUE: Contiguous axial images were obtained from the base of the skull through the vertex without intravenous contrast. RADIATION  DOSE REDUCTION: This exam was performed according to the departmental dose-optimization program which includes automated exposure control, adjustment of the mA and/or kV according to patient size and/or use of iterative reconstruction technique. COMPARISON:  02/04/2023 FINDINGS: Brain: No evidence of acute infarction, hemorrhage, mass, mass effect, or midline shift. No hydrocephalus or extra-axial fluid collection. Periventricular white matter changes, likely the sequela of chronic small vessel ischemic disease. Age related cerebral atrophy. Remote lacunar infarct in the basal ganglia, cerebellum, right frontal lobe, and brainstem. Vascular: No hyperdense vessel. No hyperdense vessel. Atherosclerotic calcifications in the intracranial carotid and vertebral arteries. Skull: Negative for fracture or focal lesion. Hyperostosis frontalis. Sinuses/Orbits: No acute finding. Other: The mastoid air cells are well aerated. IMPRESSION: No acute intracranial process. Electronically Signed   By: Wiliam Ke M.D.   On: 10/12/2023 13:09    Procedures Procedures    Medications Ordered in ED Medications - No data to display  ED Course/ Medical Decision Making/ A&P Clinical Course as of 10/12/23 1634  Fri Oct 12, 2023  1230 Patient to ED from nursing home [facility] Unwitnessed fall last night, not anticoagulated. Today with altered mental.  Discussed with facility staff who was familiar with this patient. They report usually quite verbal. Can transfer to wheelchair with minimal assist and pushes herself around for mobility. No report fever or recent illness. No vomiting or diarrhea. No cough or SOB.  [SU]  1412 Delay in lab collection. Head CT without acute changes.  [SU]  1532 Recheck - the patient is much more awake, now verbal, clear speech. No complaints. Asking for nurse. Labs pending at time of sign out at end of shift to oncoming provider team. Feel with improvement in mentation, neg head CT, facility  resident, if labs are normal, likely discharge back to facility.  [SU]  1546 CT Head Wo Contrast [AA]    Clinical Course User Index [AA] Marita Kansas, PA-C [SU] Elpidio Anis, PA-C                                 Medical Decision Making Amount and/or Complexity of Data Reviewed Labs: ordered. Radiology: ordered. Decision-making details documented in ED Course.           Final Clinical Impression(s) / ED Diagnoses Final diagnoses:  Transient alteration of awareness  Unwitnessed fall    Rx / DC Orders ED Discharge Orders     None         Elpidio Anis, PA-C 10/12/23 1635    Horton, Clabe Seal, DO 10/20/23 [phone]

## 2023-10-12 NOTE — ED Triage Notes (Signed)
Patient presents to ed via [facility] states she is from [employer] fell last pm not on thinners was placed back in bed states this am when PT came to work with her she wasn't acting her normal, however per ems staff couldn't really state what her normal is. Patient is non verbal at present.

## 2023-10-13 DIAGNOSIS — R296 Repeated falls: Secondary | ICD-10-CM

## 2023-10-13 DIAGNOSIS — R404 Transient alteration of awareness: Secondary | ICD-10-CM

## 2023-10-13 LAB — GLUCOSE, CAPILLARY
Glucose-Capillary: 89 mg/dL (ref 70–99)
Glucose-Capillary: 93 mg/dL (ref 70–99)
Glucose-Capillary: 115 mg/dL — ABNORMAL HIGH (ref 70–99)
Glucose-Capillary: 105 mg/dL — ABNORMAL HIGH (ref 70–99)

## 2023-10-13 LAB — PROTIME-INR
INR: 1.1 (ref 0.8–1.2)
Prothrombin Time: 14.7 s (ref 11.4–15.2)

## 2023-10-13 LAB — CBC
HCT: 37.5 % (ref 36.0–46.0)
Hemoglobin: 12.8 g/dL (ref 12.0–15.0)
MCH: 29.2 pg (ref 26.0–34.0)
MCHC: 34.1 g/dL (ref 30.0–36.0)
MCV: 85.4 fL (ref 80.0–100.0)
Platelets: 295 10*3/uL (ref 150–400)
RBC: 4.39 MIL/uL (ref 3.87–5.11)
RDW: 15.1 % (ref 11.5–15.5)
WBC: 5.4 10*3/uL (ref 4.0–10.5)
nRBC: 0 % (ref 0.0–0.2)

## 2023-10-13 LAB — RAPID URINE DRUG SCREEN, HOSP PERFORMED
Amphetamines: NOT DETECTED
Barbiturates: NOT DETECTED
Benzodiazepines: NOT DETECTED
Cocaine: NOT DETECTED
Opiates: NOT DETECTED
Tetrahydrocannabinol: NOT DETECTED

## 2023-10-13 LAB — BASIC METABOLIC PANEL
Anion gap: 12 (ref 5–15)
BUN: 13 mg/dL (ref 8–23)
CO2: 24 mmol/L (ref 22–32)
Calcium: 9.2 mg/dL (ref 8.9–10.3)
Chloride: 102 mmol/L (ref 98–111)
Creatinine, Ser: 0.81 mg/dL (ref 0.44–1.00)
GFR, Estimated: >60 mL/min (ref >60)
Glucose, Bld: 89 mg/dL (ref 70–99)
Potassium: 3.7 mmol/L (ref 3.5–5.1)
Sodium: 138 mmol/L (ref 135–145)

## 2023-10-13 LAB — APTT: aPTT: 32 s (ref 24–36)

## 2023-10-13 LAB — VITAMIN B12: Vitamin B-12: 657 pg/mL (ref 180–914)

## 2023-10-13 MED ORDER — DONEPEZIL HCL 10 MG PO TABS
5.0000 mg | ORAL_TABLET | Freq: Two times a day (BID) | ORAL | Status: DC
  Administered 2023-10-13 – 2023-10-14 (×2): 5 mg via ORAL
  Filled 2023-10-13 (×2): qty 1

## 2023-10-13 MED ORDER — ASPIRIN 325 MG PO TABS
325.0000 mg | ORAL_TABLET | Freq: Every day | ORAL | Status: DC
  Administered 2023-10-13 – 2023-10-14 (×2): 325 mg via ORAL
  Filled 2023-10-13 (×2): qty 1

## 2023-10-13 MED ORDER — MEMANTINE HCL 10 MG PO TABS
10.0000 mg | ORAL_TABLET | Freq: Two times a day (BID) | ORAL | Status: DC
  Administered 2023-10-13 – 2023-10-14 (×2): 10 mg via ORAL
  Filled 2023-10-13 (×2): qty 1

## 2023-10-13 MED ORDER — METOPROLOL TARTRATE 12.5 MG HALF TABLET
12.5000 mg | ORAL_TABLET | Freq: Two times a day (BID) | ORAL | Status: DC
  Administered 2023-10-13 – 2023-10-14 (×2): 12.5 mg via ORAL
  Filled 2023-10-13 (×2): qty 1

## 2023-10-13 MED ORDER — ESCITALOPRAM OXALATE 10 MG PO TABS
20.0000 mg | ORAL_TABLET | Freq: Every morning | ORAL | Status: DC
  Administered 2023-10-14: 20 mg via ORAL
  Filled 2023-10-13: qty 2

## 2023-10-13 MED ORDER — MELATONIN 3 MG PO TABS
3.0000 mg | ORAL_TABLET | Freq: Every day | ORAL | Status: DC
  Administered 2023-10-13: 3 mg via ORAL
  Filled 2023-10-13: qty 1

## 2023-10-13 NOTE — Hospital Course (Signed)
78 y.o. female with medical history significant of residence in [facility] [place] [facility] nursing and rehabilitation.  Patient apparently has a chronic right lower extremity weakness as well as generalized weakness and gait abnormality and often uses a wheelchair.  Patient also has a documented vascular dementia on her admission record with mild severity with agitation.  She also has documented cognitive communication deficit.  She has had prior cerebrovascular disease.  As well as mitral stenosis.   Per her report obtained from the ER provider it seems that the patient is generally alert awake and fully oriented.  Further I am told that today patient was not talking appropriately at the staff at the SNF and therefore EMS was called.  There seems to be no further clarification about what exactly transpired at the nursing home.  Here in the ER patient has been evaluated by 2 of our ER providers and it is noted that the patient has been having a fluctuating mental status course.  Occasionally interacting appropriately and with good insight, at other times not interacting much or talking in a disconnected fashion.   Patient has been felt to have urinary tract infection in the ER and has been started on antibiotics.

## 2023-10-13 NOTE — Plan of Care (Signed)

## 2023-10-13 NOTE — Progress Notes (Signed)
  Progress Note   Patient: Christine Christensen DGU:[id number] DOB: 1946/04/07 DOA: 10/12/2023     0 DOS: the patient was seen and examined on 10/13/2023   Brief hospital course: 78 y.o. female with medical history significant of residence in [place].  Patient apparently has a chronic right lower extremity weakness as well as generalized weakness and gait abnormality and often uses a wheelchair.  Patient also has a documented vascular dementia on her admission record with mild severity with agitation.  She also has documented cognitive communication deficit.  She has had prior cerebrovascular disease.  As well as mitral stenosis.   Per her report obtained from the ER provider it seems that the patient is generally alert awake and fully oriented.  Further I am told that today patient was not talking appropriately at the staff at the SNF and therefore EMS was called.  There seems to be no further clarification about what exactly transpired at the nursing home.  Here in the ER patient has been evaluated by 2 of our ER providers and it is noted that the patient has been having a fluctuating mental status course.  Occasionally interacting appropriately and with good insight, at other times not interacting much or talking in a disconnected fashion.   Patient has been felt to have urinary tract infection in the ER and has been started on antibiotics.   Assessment and Plan: Aphasia -Reportedly presented with cognitive decline which has slightly fluctuated.  -MRI neg for CVA -UA notable for UTI, now on abx -Conversing appropriately this AM.   UTI (urinary tract infection) -Presented with no leukocytosis -afebrile -On empiric rocephin -Urine cx pending -CXR reviewed, clear   Dementia -cont home meds as tolerated -Cont supportive care  Chronic diastolic chf -appears to be euvolemic at this time -cont to monitor for now  HTN -BP currently stable -Will  continue home BB     Subjective: Without complaints, pleasant this AM  Physical Exam: Vitals:   10/12/23 2337 10/13/23 0417 10/13/23 0735 10/13/23 1056  BP: (!) 148/72 (!) 151/72 119/80 (!) 144/89  Pulse: (!) 55 60 (!) 58 84  Resp: 16 14 17 17   Temp: 98.1 F (36.7 C) 98.6 F (37 C) 98.6 F (37 C) (!) 97.5 F (36.4 C)  TempSrc: Oral Oral Oral Oral  SpO2: 97% 97% 96% 100%  Weight:      Height:       General exam: Awake, laying in bed, in nad Respiratory system: Normal respiratory effort, no wheezing Cardiovascular system: regular rate, s1, s2 Gastrointestinal system: Soft, nondistended, positive BS Central nervous system: CN2-12 grossly intact, strength intact Extremities: Perfused, no clubbing Skin: Normal skin turgor, no notable skin lesions seen Psychiatry: Mood normal // no visual hallucinations   Data Reviewed:  Labs reviewed: Na 138, K 3.7, Cr 0.81, WBC 5.4, Hgb 12.8, Plts 15.1  Family Communication: Pt in room, family not at bedside  Disposition: Status is: Observation The patient remains OBS appropriate and will d/c before 2 midnights.  Planned Discharge Destination: Skilled nursing facility     Author: Rickey Barbara, MD 10/13/2023 3:24 PM  For on call review [email or web address].

## 2023-10-13 NOTE — Care Management Obs Status (Signed)
MEDICARE OBSERVATION STATUS NOTIFICATION   Patient Details  Name: Christine Christensen MRN: [id number] Date of Birth: 1946-07-26   Medicare Observation Status Notification Given:  Yes    Lawerance Sabal, RN 10/13/2023, 3:17 PM

## 2023-10-14 DIAGNOSIS — N390 Urinary tract infection, site not specified: Secondary | ICD-10-CM | POA: Diagnosis not present

## 2023-10-14 DIAGNOSIS — I5032 Chronic diastolic (congestive) heart failure: Secondary | ICD-10-CM | POA: Diagnosis not present

## 2023-10-14 DIAGNOSIS — R404 Transient alteration of awareness: Secondary | ICD-10-CM | POA: Diagnosis not present

## 2023-10-14 DIAGNOSIS — F0390 Unspecified dementia without behavioral disturbance: Secondary | ICD-10-CM | POA: Diagnosis not present

## 2023-10-14 DIAGNOSIS — R296 Repeated falls: Secondary | ICD-10-CM | POA: Diagnosis not present

## 2023-10-14 DIAGNOSIS — R4701 Aphasia: Secondary | ICD-10-CM | POA: Diagnosis not present

## 2023-10-14 LAB — COMPREHENSIVE METABOLIC PANEL
ALT: 15 U/L (ref 0–44)
AST: 16 U/L (ref 15–41)
Albumin: 3.1 g/dL — ABNORMAL LOW (ref 3.5–5.0)
Alkaline Phosphatase: 57 U/L (ref 38–126)
Anion gap: 8 (ref 5–15)
BUN: 15 mg/dL (ref 8–23)
CO2: 25 mmol/L (ref 22–32)
Calcium: 9.1 mg/dL (ref 8.9–10.3)
Chloride: 105 mmol/L (ref 98–111)
Creatinine, Ser: 0.88 mg/dL (ref 0.44–1.00)
GFR, Estimated: >60 mL/min (ref >60)
Glucose, Bld: 89 mg/dL (ref 70–99)
Potassium: 3.6 mmol/L (ref 3.5–5.1)
Sodium: 138 mmol/L (ref 135–145)
Total Bilirubin: 0.6 mg/dL (ref 0.0–1.2)
Total Protein: 6.5 g/dL (ref 6.5–8.1)

## 2023-10-14 LAB — CBC
HCT: 36.1 % (ref 36.0–46.0)
Hemoglobin: 12.3 g/dL (ref 12.0–15.0)
MCH: 29.1 pg (ref 26.0–34.0)
MCHC: 34.1 g/dL (ref 30.0–36.0)
MCV: 85.5 fL (ref 80.0–100.0)
Platelets: 295 10*3/uL (ref 150–400)
RBC: 4.22 MIL/uL (ref 3.87–5.11)
RDW: 15.3 % (ref 11.5–15.5)
WBC: 5.7 10*3/uL (ref 4.0–10.5)
nRBC: 0 % (ref 0.0–0.2)

## 2023-10-14 LAB — GLUCOSE, CAPILLARY
Glucose-Capillary: 84 mg/dL (ref 70–99)
Glucose-Capillary: 114 mg/dL — ABNORMAL HIGH (ref 70–99)

## 2023-10-14 MED ORDER — CEPHALEXIN 500 MG PO CAPS
500.0000 mg | ORAL_CAPSULE | Freq: Two times a day (BID) | ORAL | Status: DC
  Administered 2023-10-14: 500 mg via ORAL
  Filled 2023-10-14: qty 1

## 2023-10-14 MED ORDER — CEPHALEXIN 500 MG PO CAPS: 500.0000 mg | ORAL_CAPSULE | Freq: Two times a day (BID) | ORAL | 0 refills | Status: AC

## 2023-10-14 MED ORDER — THIAMINE MONONITRATE 100 MG PO TABS
100.0000 mg | ORAL_TABLET | Freq: Every day | ORAL | Status: DC
  Administered 2023-10-14: 100 mg via ORAL
  Filled 2023-10-14: qty 1

## 2023-10-14 NOTE — Evaluation (Addendum)
Physical Therapy Evaluation and Discharge  Patient Details Name: Christine Christensen MRN: [id number] DOB: 1946-01-07 Today's Date: 10/14/2023  History of Present Illness  Pt is a 78 y/o female who presents 10/12/2023 s/p unwitnessed fall at [facility] and aphasia. UA notable for UTI. PMHx includes vascular dementia, HTN, CVAs with residual R-side weakness (2005, 2008), Meniere's disease, bilateral TKAs, arthritis, synocope and collapse, and aortic stenosis.   Clinical Impression  Patient evaluated by Physical Therapy with no further acute PT needs identified. All education has been completed and the patient has no further questions. Anticipate pt is at/near baseline of function, as she spends most of her time in a wheelchair at [facility]. It is unclear whether pt was receiving therapy services at SNF per chart review. If she was receiving PT services, recommend resuming upon return. See below for any follow-up Physical Therapy or equipment needs. PT is signing off. Thank you for this referral.         If plan is discharge home, recommend the following: A lot of help with walking and/or transfers;A lot of help with bathing/dressing/bathroom;Assistance with cooking/housework;Assist for transportation;Help with stairs or ramp for entrance;Supervision due to cognitive status   Can travel by private vehicle   No    Equipment Recommendations None recommended by PT  Recommendations for Other Services       Functional Status Assessment Patient has not had a recent decline in their functional status     Precautions / Restrictions Precautions Precautions: Fall Restrictions Weight Bearing Restrictions Per Provider Order: No      Mobility  Bed Mobility Overal bed mobility: Needs Assistance Bed Mobility: Supine to Sit     Supine to sit: Min assist, +2 for safety/equipment     General bed mobility comments: Increased time to initiate with repetition of cues. Assist for trunk elevation to full  sitting position.    Transfers Overall transfer level: Needs assistance Equipment used: 2 person hand held assist Transfers: Sit to/from Stand Sit to Stand: Min assist, +2 safety/equipment, +2 physical assistance           General transfer comment: +2 for power up to full stand and to gain/maintain standing balance.    Ambulation/Gait Ambulation/Gait assistance: Min assist, +2 physical assistance, +2 safety/equipment Gait Distance (Feet): 8 Feet (8+6) Assistive device: 2 person hand held assist Gait Pattern/deviations: Shuffle, Trunk flexed, Narrow base of support Gait velocity: Decreased Gait velocity interpretation: <1.31 ft/sec, indicative of household ambulator   General Gait Details: HHA provided for ambulation to sink where pt was able to stand and wash her hands. Pt then was able to ambulate back to the recliner to sit. Pt maintained ~5 minutes standing activity.  Stairs            Wheelchair Mobility     Tilt Bed    Modified Rankin (Stroke Patients Only)       Balance Overall balance assessment: Needs assistance Sitting-balance support: Feet supported Sitting balance-Leahy Scale: Fair Sitting balance - Comments: posterior LOBs when lifting LEs   Standing balance support: Single extremity supported, During functional activity Standing balance-Leahy Scale: Fair Standing balance comment: statically at the sink                             Pertinent Vitals/Pain Pain Assessment Pain Assessment: Faces Faces Pain Scale: Hurts a little bit Pain Location: "All over" Pain Descriptors / Indicators: Grimacing Pain Intervention(s): Limited activity within patient's tolerance,  Monitored during session, Repositioned    Home Living Family/patient expects to be discharged to:: Skilled nursing facility                   Additional Comments[facility]    Prior Function Prior Level of Function : Needs assist;Patient poor  historian/Family not available;History of Falls (last six months)             Mobility Comments: Pt reports that she primarily uses a wheelchair. She states that she can walk to her wheelchair by herself without a walker however unsure how accurate this is. ADLs Comments: Pt reports that she does not get to a toilet, and she uses the bathroom in the sheets at SNF. Again, pt is a poor historian and unsure how accurate this is.     Extremity/Trunk Assessment   Upper Extremity Assessment Upper Extremity Assessment: Defer to OT evaluation RUE Deficits / Details: noted weakness and mildly impaired coordination however was [facility] for LB ADLs, grooming and feeding. Likely baseline. RUE Sensation: WNL RUE Coordination: decreased fine motor    Lower Extremity Assessment Lower Extremity Assessment: RLE deficits/detail;LLE deficits/detail RLE Deficits / Details: Noted muscle atrophy on RLE compared to L and pt keeps RLE internally rotated. She is able to extend R knee out some but unable to gain full AROM. LLE Deficits / Details: Generalized weakness. Noted good knee extension with full AROM.    Cervical / Trunk Assessment Cervical / Trunk Assessment: Other exceptions Cervical / Trunk Exceptions: Forward head posture with rounded shoulders  Communication   Communication Communication: No apparent difficulties Cueing Techniques: Verbal cues;Gestural cues;Tactile cues;Visual cues  Cognition Arousal: Alert Behavior During Therapy: WFL for tasks assessed/performed Overall Cognitive Status: History of cognitive impairments - at baseline                                 General Comments: Perseverating on a story regarding a doctor wanting to remove 4 of her toes, and dislike of food here at the hospital.        General Comments General comments (skin integrity, edema, etc.): VSS    Exercises     Assessment/Plan    PT Assessment All further PT needs can be met in the next  venue of care  PT Problem List Decreased strength;Decreased range of motion;Decreased activity tolerance;Decreased balance;Decreased mobility;Decreased knowledge of use of DME;Decreased safety awareness;Decreased knowledge of precautions;Decreased cognition       PT Treatment Interventions      PT Goals (Current goals can be found in the Care Plan section)  Acute Rehab PT Goals Patient Stated Goal: Return to SNF PT Goal Formulation: All assessment and education complete, DC therapy    Frequency       Co-evaluation   Reason for Co-Treatment: Complexity of the patient's impairments (multi-system involvement);For patient/therapist safety;To address functional/ADL transfers   OT goals addressed during session: ADL's and self-care       AM-PAC PT "6 Clicks" Mobility  Outcome Measure Help needed turning from your back to your side while in a flat bed without using bedrails?: A Little Help needed moving from lying on your back to sitting on the side of a flat bed without using bedrails?: A Little Help needed moving to and from a bed to a chair (including a wheelchair)?: A Lot Help needed standing up from a chair using your arms (e.g., wheelchair or bedside chair)?: A  Lot Help needed to walk in hospital room?: A Lot Help needed climbing 3-5 steps with a railing? : Total 6 Click Score: 13    End of Session Equipment Utilized During Treatment: Gait belt Activity Tolerance: Patient tolerated treatment well Patient left: in chair;with call bell/phone within reach;with chair alarm set Nurse Communication: Mobility status PT Visit Diagnosis: Unsteadiness on feet (R26.81);Difficulty in walking, not elsewhere classified (R26.2)    Time: 0940-1000 PT Time Calculation (min) (ACUTE ONLY): 20 min   Charges:   PT Evaluation $PT Eval Low Complexity: 1 Low   PT General Charges $$ ACUTE PT VISIT: 1 Visit         Conni Slipper, PT, DPT Acute Rehabilitation Services Secure Chat  Preferred Office: [phone]   Marylynn Pearson 10/14/2023, 11:06 AM

## 2023-10-14 NOTE — TOC Transition Note (Signed)
Transition of Care [facility]) - Discharge Note   Patient Details  Name: Christine Christensen MRN: [id number] Date of Birth: Jun 19, 1946  Transition of Care [facility]) CM/SW Contact:  Deatra Robinson, [place] Phone Number: 10/14/2023, 2:23 PM   Clinical Narrative: Pt for dc back to [facility] where she is a LTC resident. Spoke to [place] in admissions who confirmed they are prepared to admit pt to room 103B. Per MD, pt's son aware of dc and reports agreeable. RN provided with number for report and PTAR arranged for transport. SW signing off at dc.  Dellie Burns, MSW, LCSW [phone] (coverage)        Final next level of care: Skilled Nursing Facility Barriers to Discharge: Barriers Resolved   Patient Goals and CMS Choice     Choice offered to / list presented to : Adult Children [place] ownership interest in [facility].provided to:: Adult Children    Discharge Placement              Patient chooses bed at: Other - please specify in the comment section below: [facility]) Patient to be transferred to facility by: PTAR Name of family member notified: Brandon/son Patient and family notified of of transfer: 10/14/23  Discharge Plan and Services Additional resources added to the After Visit Summary for       Post Acute Care Choice: Skilled Nursing Facility                               Social Drivers of Health (SDOH) Interventions SDOH Screenings   Food Insecurity: Patient Unable To Answer (10/12/2023)  Housing: Unknown (10/12/2023)  Transportation Needs: Patient Unable To Answer (10/12/2023)  Utilities: Not At Risk (10/12/2023)  Social Connections: Patient Unable To Answer (10/12/2023)  Tobacco Use: High Risk (10/12/2023)     Readmission Risk Interventions     No data to display

## 2023-10-14 NOTE — Plan of Care (Signed)

## 2023-10-14 NOTE — Discharge Summary (Signed)
Physician Discharge Summary   Patient: Christine Christensen MRN: [id number] DOB: [birth year]  Admit date:     10/12/2023  Discharge date: 10/14/23  Discharge Physician: Rickey Barbara   PCP: Fleet Contras, MD   Recommendations at discharge:    Follow up with PCP in 1-2 weeks Please ensure proper good patient hygiene to prevent future UTI as well as ensuring pt is being wiped front to back  Discharge Diagnoses: Principal Problem:   Aphasia Active Problems:   UTI (urinary tract infection)  Resolved Problems:   * No resolved hospital problems. *  Hospital Course: 78 y.o. female with medical history significant of residence in [place].  Patient apparently has a chronic right lower extremity weakness as well as generalized weakness and gait abnormality and often uses a wheelchair.  Patient also has a documented vascular dementia on her admission record with mild severity with agitation.  She also has documented cognitive communication deficit.  She has had prior cerebrovascular disease.  As well as mitral stenosis.   Per her report obtained from the ER provider it seems that the patient is generally alert awake and fully oriented.  Further I am told that today patient was not talking appropriately at the staff at the SNF and therefore EMS was called.  There seems to be no further clarification about what exactly transpired at the nursing home.  Here in the ER patient has been evaluated by 2 of our ER providers and it is noted that the patient has been having a fluctuating mental status course.  Occasionally interacting appropriately and with good insight, at other times not interacting much or talking in a disconnected fashion.   Patient has been felt to have urinary tract infection in the ER and has been started on antibiotics.   Assessment and Plan: Aphasia -Reportedly presented with cognitive decline which has slightly fluctuated.  -MRI neg for  CVA -UA notable for UTI, urine cx had grown >100,000 ecoli. Responded well with empiric rocephin per below. Will prescribe 4 more days of keflex on discharge -Conversing appropriately this AM.   UTI (urinary tract infection) -Presented with no leukocytosis -afebrile -Was given empiric rocephin, later transition to keflex on discharge -Urine cx pos for >100,000 ecoli -CXR reviewed, clear -Please ensure good patient hygiene at facility to prevent future UTI   Dementia -cont home meds as tolerated -Cont supportive care   Chronic diastolic chf -appears to be euvolemic at this time -cont to monitor for now   HTN -BP currently stable -Will continue home BB       Consultants:  Procedures performed:   Disposition: Skilled nursing facility Diet recommendation:  Regular diet DISCHARGE MEDICATION: Allergies as of 10/14/2023       Reactions   Penicillins Other (See Comments)   Unknown reaction        Medication List     TAKE these medications    acetaminophen 500 MG tablet Commonly known as: TYLENOL Take 2 tablets (1,000 mg total) by mouth every 6 (six) hours as needed for mild pain, moderate pain, fever or headache.   albuterol 108 (90 Base) MCG/ACT inhaler Commonly known as: VENTOLIN HFA Inhale 2 puffs into the lungs every 4 (four) hours as needed for wheezing or shortness of breath.   aspirin 325 MG tablet Take 1 tablet (325 mg total) by mouth daily.   busPIRone 5 MG tablet Commonly known as: BUSPAR Take 5 mg by mouth 3 (three) times daily.  cephALEXin 500 MG capsule Commonly known as: KEFLEX Take 1 capsule (500 mg total) by mouth every 12 (twelve) hours for 4 days.   donepezil 5 MG tablet Commonly known as: ARICEPT Take 5 mg by mouth in the morning and at bedtime.   escitalopram 20 MG tablet Commonly known as: LEXAPRO Take 20 mg by mouth every morning.   fexofenadine 180 MG tablet Commonly known as: ALLEGRA Take 180 mg by mouth daily as needed for  allergies or rhinitis.   LACTOBACILLUS PO Take 1 capsule by mouth in the morning and at bedtime.   melatonin 3 MG Tabs tablet Take 1 tablet (3 mg total) by mouth at bedtime.   memantine 10 MG tablet Commonly known as: NAMENDA Take 10 mg by mouth 2 (two) times daily.   metoprolol tartrate 25 MG tablet Commonly known as: LOPRESSOR Take 0.5 tablets (12.5 mg total) by mouth 2 (two) times daily.   multivitamin with minerals Tabs tablet Take 1 tablet by mouth daily.   polyethylene glycol 17 g packet Commonly known as: MIRALAX / GLYCOLAX Take 17 g by mouth 2 (two) times daily.   senna-docusate 8.6-50 MG tablet Commonly known as: Senokot-S Take 1 tablet by mouth 2 (two) times daily.   traZODone 50 MG tablet Commonly known as: DESYREL Take 50 mg by mouth at bedtime.        Contact information for after-discharge care     Destination     [facility] .   Service: Skilled Nursing Contact information: [address] [place] [address] [phone]                    Discharge Exam: Ceasar Mons Weights   10/12/23 1221  Weight: 68 kg   General exam: Awake, laying in bed, in nad Respiratory system: Normal respiratory effort, no wheezing Cardiovascular system: regular rate, s1, s2 Gastrointestinal system: Soft, nondistended, positive BS Central nervous system: CN2-12 grossly intact, strength intact Extremities: Perfused, no clubbing Skin: Normal skin turgor, no notable skin lesions seen Psychiatry: Mood normal // no visual hallucinations   Condition at discharge: fair  The results of significant diagnostics from this hospitalization (including imaging, microbiology, ancillary and laboratory) are listed below for reference.   Imaging Studies: DG CHEST PORT 1 VIEW Result Date: 10/12/2023 CLINICAL DATA:  Altered mental status EXAM: PORTABLE CHEST 1 VIEW COMPARISON:  02/04/2023 FINDINGS: Mild diffuse bronchitic changes. No consolidation or  effusion. Stable cardiomediastinal silhouette with aortic atherosclerosis IMPRESSION: No active disease. Mild diffuse bronchitic changes. Electronically Signed   By: Jasmine Pang M.D.   On: 10/12/2023 20:47   MR BRAIN WO CONTRAST Result Date: 10/12/2023 CLINICAL DATA:  Initial evaluation for acute neuro deficit, stroke suspected. EXAM: MRI HEAD WITHOUT CONTRAST TECHNIQUE: Multiplanar, multiecho pulse sequences of the brain and surrounding structures were obtained without intravenous contrast. COMPARISON:  Prior head CT from earlier the same day. FINDINGS: Brain: Generalized age-related cerebral volume loss. Patchy T2/FLAIR hyperintensity involving the periventricular and deep white matter both cerebral hemispheres as well as the pons, consistent with chronic microvascular ischemic disease, moderate to advanced in nature. Scattered remote lacunar infarcts present about the bilateral basal ganglia and pons. Few small chronic cerebellar infarcts noted, right greater than left. No evidence for acute or subacute ischemia. Gray-white matter differentiation maintained. No areas of chronic cortical infarction. No acute intracranial hemorrhage. Few scattered chronic micro hemorrhages noted about the thalami, likely hypertensive in nature. No mass lesion, midline shift or mass effect. No hydrocephalus  or extra-axial fluid collection. Pituitary gland suprasellar region within normal limits. Vascular: Major intracranial vascular flow voids are maintained. Skull and upper cervical spine: Craniocervical junction within normal limits. Bone marrow signal intensity normal. No scalp soft tissue abnormality. Sinuses/Orbits: Prior bilateral ocular lens replacement. Paranasal sinuses are largely clear. No significant mastoid effusion. Other: Under IMPRESSION: 1. No acute intracranial abnormality. 2. Age-related cerebral volume loss with moderate to advanced chronic microvascular ischemic disease, with a few scattered remote lacunar  infarcts about the bilateral basal ganglia, pons, and cerebellum. Electronically Signed   By: Rise Mu M.D.   On: 10/12/2023 20:07   CT Head Wo Contrast Result Date: 10/12/2023 CLINICAL DATA:  Head trauma, altered mental status. EXAM: CT HEAD WITHOUT CONTRAST TECHNIQUE: Contiguous axial images were obtained from the base of the skull through the vertex without intravenous contrast. RADIATION DOSE REDUCTION: This exam was performed according to the departmental dose-optimization program which includes automated exposure control, adjustment of the mA and/or kV according to patient size and/or use of iterative reconstruction technique. COMPARISON:  02/04/2023 FINDINGS: Brain: No evidence of acute infarction, hemorrhage, mass, mass effect, or midline shift. No hydrocephalus or extra-axial fluid collection. Periventricular white matter changes, likely the sequela of chronic small vessel ischemic disease. Age related cerebral atrophy. Remote lacunar infarct in the basal ganglia, cerebellum, right frontal lobe, and brainstem. Vascular: No hyperdense vessel. No hyperdense vessel. Atherosclerotic calcifications in the intracranial carotid and vertebral arteries. Skull: Negative for fracture or focal lesion. Hyperostosis frontalis. Sinuses/Orbits: No acute finding. Other: The mastoid air cells are well aerated. IMPRESSION: No acute intracranial process. Electronically Signed   By: Wiliam Ke M.D.   On: 10/12/2023 13:09    Microbiology: Results for orders placed or performed during the hospital encounter of 10/12/23  Urine Culture (for pregnant, neutropenic or urologic patients or patients with an indwelling urinary catheter)     Status: Abnormal (Preliminary result)   Collection Time: 10/13/23  7:50 AM   Specimen: Urine, Clean Catch  Result Value Ref Range Status   Specimen Description URINE, CLEAN CATCH  Final   Special Requests NONE  Final   Culture (A)  Final    >=100,000 COLONIES/mL  ESCHERICHIA COLI SUSCEPTIBILITIES TO FOLLOW Performed at [facility], [address] [place], [place] [address]    Report Status PENDING  Incomplete    Labs: CBC: Recent Labs  Lab 10/12/23 1508 10/12/23 2039 10/13/23 0922 10/14/23 0634  WBC 6.1 6.9 5.4 5.7  NEUTROABS 3.6  --   --   --   HGB 12.1 12.2 12.8 12.3  HCT 36.4 37.2 37.5 36.1  MCV 86.9 86.3 85.4 85.5  PLT 294 278 295 295   Basic Metabolic Panel: Recent Labs  Lab 10/12/23 1508 10/12/23 2039 10/13/23 0922 10/14/23 0634  NA 139  --  138 138  K 3.8  --  3.7 3.6  CL 103  --  102 105  CO2 25  --  24 25  GLUCOSE 83  --  89 89  BUN 12  --  13 15  CREATININE 0.74 0.73 0.81 0.88  CALCIUM 9.3  --  9.2 9.1   Liver Function Tests: Recent Labs  Lab 10/12/23 1508 10/14/23 0634  AST 21 16  ALT 15 15  ALKPHOS 54 57  BILITOT 0.3 0.6  PROT 6.8 6.5  ALBUMIN 3.2* 3.1*   CBG: Recent Labs  Lab 10/13/23 0628 10/13/23 1058 10/13/23 1603 10/13/23 2125 10/14/23 0632  GLUCAP 89 93 115* 105*  84    Discharge time spent: less than 30 minutes.  Signed: Rickey Barbara, MD [facility] 10/14/2023

## 2023-10-14 NOTE — TOC Initial Note (Signed)
Transition of Care [facility]) - Initial/Assessment Note    Patient Details  Name: Christine Christensen MRN: [id number] Date of Birth: 08-08-1946  Transition of Care [facility]) CM/SW Contact:    Deatra Robinson, [place] Phone Number: 10/14/2023, 9:20 AM  Clinical Narrative: Pt admitted from [facility] where she is a LTC resident. Confirmed with Janie in admissions pt is able to return at dc. Spoke with pt's son who voiced multiple concerns re care and communication at [facility]. Notified Janie at [facility] who reports she will make their administrator aware of concerns and they will reach out to the son. Pt's son reports agreeable with return to [facility] as he is unable to care for pt at home. Will assist with return to [facility] pending medical clearance.   Dellie Burns, MSW, LCSW [phone] (coverage)                     Expected Discharge Plan: Skilled Nursing Facility Barriers to Discharge: Continued Medical Work up   Patient Goals and CMS Choice            Expected Discharge Plan and Services     Post Acute Care Choice: Skilled Nursing Facility Living arrangements for the past 2 months: Skilled Nursing Facility                                      Prior Living Arrangements/Services Living arrangements for the past 2 months: Skilled Nursing Facility Lives with:: Facility Resident Patient language and need for interpreter reviewed:: Yes        Need for Family Participation in Patient Care: Yes (Comment) Care giver support system in place?: No (comment)   Criminal Activity/Legal Involvement Pertinent to Current Situation/Hospitalization: No - Comment as needed  Activities of Daily Living   ADL Screening (condition at time of admission) Independently performs ADLs?: No Does the patient have a NEW difficulty with bathing/dressing/toileting/self-feeding that is expected to last >3 days?: No Does the patient have a NEW difficulty with getting in/out of bed,  walking, or climbing stairs that is expected to last >3 days?: No Does the patient have a NEW difficulty with communication that is expected to last >3 days?: No Is the patient deaf or have difficulty hearing?: Yes Does the patient have difficulty seeing, even when wearing glasses/contacts?: No Does the patient have difficulty concentrating, remembering, or making decisions?: Yes  Permission Sought/Granted Permission sought to share information with : Facility Industrial/product designer granted to share information with : Yes, Verbal Permission Granted              Emotional Assessment       Orientation: : Fluctuating Orientation (Suspected and/or reported Sundowners) Alcohol / Substance Use: Not Applicable Psych Involvement: No (comment)  Admission diagnosis:  Transient alteration of awareness [R40.4] Aphasia [R47.01] Urinary tract infection without hematuria, site unspecified [N39.0] Unwitnessed fall [R29.6] Patient Active Problem List   Diagnosis Date Noted   Aphasia 10/12/2023   UTI (urinary tract infection) 10/12/2023   Sepsis due to COVID-19 (HCC) 11/01/2021   Lactic acidosis 11/01/2021   Acute on chronic diastolic CHF (congestive heart failure) (HCC) 11/01/2021   Essential hypertension 11/01/2021   Vascular dementia (HCC) 11/01/2021   Delirium due to another medical condition 06/22/2021   Dementia with behavioral disturbance    Pressure injury of skin 06/17/2021   Protein-calorie malnutrition, severe 06/13/2021   Closed right  hip fracture, initial encounter (HCC) 06/12/2021   Hip fracture (HCC) 06/12/2021   Fall at home, initial encounter    Syncope and collapse 10/25/2017   S/P total knee replacement 08/06/2017   PCP:  Fleet Contras, MD Pharmacy:   [facility] - [place], [place] - [address] [place] [address] Phone: [phone] Fax: [phone]     Social Drivers of Health (SDOH) Social History: SDOH  Screenings   Food Insecurity: Patient Unable To Answer (10/12/2023)  Housing: Unknown (10/12/2023)  Transportation Needs: Patient Unable To Answer (10/12/2023)  Utilities: Not At Risk (10/12/2023)  Social Connections: Patient Unable To Answer (10/12/2023)  Tobacco Use: High Risk (10/12/2023)   SDOH Interventions:     Readmission Risk Interventions     No data to display

## 2023-10-14 NOTE — Plan of Care (Signed)
  Problem: Education: Goal: Individualized Educational Video(s) Outcome: Progressing   Problem: Coping: Goal: Ability to adjust to condition or change in health will improve Outcome: Progressing   Problem: Fluid Volume: Goal: Ability to maintain a balanced intake and output will improve Outcome: Progressing

## 2023-10-14 NOTE — Evaluation (Signed)
Occupational Therapy Evaluation Patient Details Name: Christine Christensen MRN: [id number] DOB: 10-Oct-1945 Today's Date: 10/14/2023   History of Present Illness Pt is a 78 y/o female who presents 10/12/2023 s/p unwitnessed fall at [facility] and aphasia. UA notable for UTI. PMHx includes vascular dementia, HTN, CVAs with residual R-side weakness (2005, 2008), Meniere's disease, bilateral TKAs, arthritis, synocope and collapse, and aortic stenosis.   Clinical Impression   [name] was evaluated s/p the above admission list. She is from [facility] and mobilizes at [facility] at baseline, per pt she is able to complete transfers indep and has some assist with ADLs. Upon evaluation the pt was limited by baseline R weakness, unsteady gait and cognition. Overall she needed minA to get to the EOB and min A +2 to stand and ambulate within the room. Due to the deficits listed below the pt also needs up to mod A for LB ADLs and min A for UB ADLs with cues for re-direction. Anticipate pt is very close to her functional baseline and does not have acute OT needs, recommend pt discharge back to her current level of care.         If plan is discharge home, recommend the following: A little help with walking and/or transfers;A little help with bathing/dressing/bathroom;Assistance with cooking/housework;Direct supervision/assist for medications management;Direct supervision/assist for financial management;Help with stairs or ramp for entrance;Assist for transportation    Functional Status Assessment  Patient has had a recent decline in their functional status and demonstrates the ability to make significant improvements in function in a reasonable and predictable amount of time.  Equipment Recommendations  None recommended by OT       Precautions / Restrictions Precautions Precautions: Fall Restrictions Weight Bearing Restrictions Per Provider Order: No      Mobility Bed Mobility Overal bed mobility: Needs  Assistance Bed Mobility: Supine to Sit     Supine to sit: Min assist          Transfers Overall transfer level: Needs assistance Equipment used: 2 person hand held assist Transfers: Sit to/from Stand Sit to Stand: Min assist, +2 safety/equipment, +2 physical assistance                  Balance Overall balance assessment: Needs assistance Sitting-balance support: Feet supported Sitting balance-Leahy Scale: Fair Sitting balance - Comments: posterior LOBs when lifting LEs   Standing balance support: Single extremity supported, During functional activity Standing balance-Leahy Scale: Fair Standing balance comment: statically at the sink                   ADL either performed or assessed with clinical judgement   ADL Overall ADL's : Needs assistance/impaired Eating/Feeding: Independent;Sitting   Grooming: Contact guard assist;Standing;Wash/dry face   Upper Body Bathing: Set up;Sitting   Lower Body Bathing: Minimal assistance;Sit to/from stand   Upper Body Dressing : Minimal assistance;Sitting   Lower Body Dressing: Moderate assistance;Sit to/from stand   Toilet Transfer: Minimal assistance;+2 for safety/equipment;+2 for physical assistance;Ambulation Toilet Transfer Details (indicate cue type and reason): +2 HHA Toileting- Clothing Manipulation and Hygiene: Minimal assistance;Sitting/lateral lean       Functional mobility during ADLs: Minimal assistance;+2 for physical assistance;+2 for safety/equipment General ADL Comments: limited by baseline R weakness and cognition - pt is likely at her functional baseline     Vision Baseline Vision/History: 0 No visual deficits Vision Assessment?: No apparent visual deficits     Perception Perception: Not tested       Praxis Praxis:  Not tested       Pertinent Vitals/Pain Pain Assessment Pain Assessment: Faces Faces Pain Scale: No hurt Pain Intervention(s): Monitored during session      Extremity/Trunk Assessment Upper Extremity Assessment Upper Extremity Assessment: Defer to OT evaluation RUE Deficits / Details: noted weakness and mildly impaired coordination however was [facility] for LB ADLs, grooming and feeding. Likely baseline. RUE Sensation: WNL RUE Coordination: decreased fine motor   Lower Extremity Assessment Lower Extremity Assessment: RLE deficits/detail;LLE deficits/detail RLE Deficits / Details: Noted muscle atrophy on RLE compared to L and pt keeps RLE internally rotated. She is able to extend R knee out some but unable to gain full AROM. LLE Deficits / Details: Generalized weakness. Noted good knee extension with full AROM. LLE: Unable to fully assess due to pain   Cervical / Trunk Assessment Cervical / Trunk Assessment: Kyphotic   Communication Communication Communication: No apparent difficulties Cueing Techniques: Verbal cues;Gestural cues;Tactile cues;Visual cues   Cognition Arousal: Alert Behavior During Therapy: WFL for tasks assessed/performed Overall Cognitive Status: History of cognitive impairments - at baseline       General Comments: hx of dementia.On arrival, pt agitated about hospital food but easy to redirect. Follows most simple 1 step commands. Can be self-distracting. Sequenced through basic ADLs WFL     General Comments  VSS     Home Living Family/patient expects to be discharged to:: Skilled nursing facility               Additional Comments: [facility]      Prior Functioning/Environment Prior Level of Function : Needs assist;Patient poor historian/Family not available;History of Falls (last six months)             Mobility Comments: Pt reports that she primarily uses a wheelchair. She states that she can walk to her wheelchair by herself without a walker however unsure how accurate this is. ADLs Comments: Pt reports that she does not get to a toilet, and she uses the bathroom in the sheets at SNF. Again,  pt is a poor historian and unsure how accurate this is.        OT Problem List: Decreased strength;Decreased range of motion;Decreased activity tolerance;Impaired balance (sitting and/or standing);Decreased safety awareness;Decreased knowledge of use of DME or AE;Decreased knowledge of precautions         OT Goals(Current goals can be found in the care plan section) Acute Rehab OT Goals Patient Stated Goal: to go back to facility OT Goal Formulation: With patient Time For Goal Achievement: 10/14/23 Potential to Achieve Goals: Good  OT Frequency:      Co-evaluation PT/OT/SLP Co-Evaluation/Treatment: Yes Reason for Co-Treatment: Complexity of the patient's impairments (multi-system involvement);For patient/therapist safety;To address functional/ADL transfers   OT goals addressed during session: ADL's and self-care      AM-PAC OT "6 Clicks" Daily Activity     Outcome Measure Help from another person eating meals?: None Help from another person taking care of personal grooming?: A Little Help from another person toileting, which includes using toliet, bedpan, or urinal?: A Little Help from another person bathing (including washing, rinsing, drying)?: A Little Help from another person to put on and taking off regular upper body clothing?: A Little Help from another person to put on and taking off regular lower body clothing?: A Lot 6 Click Score: 18   End of Session Nurse Communication: Mobility status  Activity Tolerance: Patient tolerated treatment well Patient left: in chair;with call bell/phone within reach;with chair alarm set  OT Visit Diagnosis: Unsteadiness on feet (R26.81);Other abnormalities of gait and mobility (R26.89);Muscle weakness (generalized) (M62.81)                Time: 2952-8413 OT Time Calculation (min): 19 min Charges:  OT General Charges $OT Visit: 1 Visit OT Evaluation $OT Eval Moderate Complexity: 1 Mod  Derenda Mis, OTR/L Acute Rehabilitation  Services Office [phone] Secure Chat Communication Preferred   Donia Pounds 10/14/2023, 11:03 AM

## 2023-10-15 LAB — URINE CULTURE: Culture: >=100000 — AB

## 2023-10-26 ENCOUNTER — Emergency Department (HOSPITAL_COMMUNITY)
Admission: EM | Admit: 2023-10-26 | Discharge: 2023-10-27 | Disposition: A | Discharge status: Home / Self Care | Payer: Medicare Other | Attending: Emergency Medicine | Admitting: Emergency Medicine

## 2023-10-26 DIAGNOSIS — B9689 Other specified bacterial agents as the cause of diseases classified elsewhere: Secondary | ICD-10-CM | POA: Insufficient documentation

## 2023-10-26 DIAGNOSIS — I110 Hypertensive heart disease with heart failure: Secondary | ICD-10-CM | POA: Insufficient documentation

## 2023-10-26 DIAGNOSIS — N390 Urinary tract infection, site not specified: Secondary | ICD-10-CM | POA: Diagnosis not present

## 2023-10-26 DIAGNOSIS — Z79899 Other long term (current) drug therapy: Secondary | ICD-10-CM | POA: Insufficient documentation

## 2023-10-26 DIAGNOSIS — F0390 Unspecified dementia without behavioral disturbance: Secondary | ICD-10-CM | POA: Diagnosis not present

## 2023-10-26 DIAGNOSIS — Z7982 Long term (current) use of aspirin: Secondary | ICD-10-CM | POA: Insufficient documentation

## 2023-10-26 DIAGNOSIS — R509 Fever, unspecified: Secondary | ICD-10-CM | POA: Diagnosis present

## 2023-10-26 DIAGNOSIS — J101 Influenza due to other identified influenza virus with other respiratory manifestations: Secondary | ICD-10-CM | POA: Insufficient documentation

## 2023-10-26 DIAGNOSIS — I5033 Acute on chronic diastolic (congestive) heart failure: Secondary | ICD-10-CM | POA: Insufficient documentation

## 2023-10-26 DIAGNOSIS — Z8616 Personal history of COVID-19: Secondary | ICD-10-CM | POA: Diagnosis not present

## 2023-10-26 DIAGNOSIS — Z20822 Contact with and (suspected) exposure to covid-19: Secondary | ICD-10-CM | POA: Diagnosis not present

## 2023-10-26 NOTE — ED Provider Notes (Signed)
 [place] EMERGENCY DEPARTMENT AT [facility] Provider Note  CSN: [id number] Arrival date & time: 10/26/23 2348  Chief Complaint(s) Code Sepsis  HPI Christine Christensen is a 78 y.o. female with a past medical history listed below including dementia, prior strokes coming from skilled nursing facility for fever, cough and abdominal discomfort.  Level 5 caveat due to dementia  On review of records, patient was discharged 2 weeks ago after being admitted for altered mental status related to urinary tract infection, cultures growing out E. coli treated with Rocephin  and discharged on Keflex .  E. coli was multidrug-resistant sensitive only to gentamicin, imipenem and Macrobid .  No note in chart of change of antibiotics.  The history is provided by the EMS personnel and medical records.    Past Medical History Past Medical History:  Diagnosis Date   Anemia    history of anemia   Aortic stenosis    mild aortic stenosis 07/27/17 echo   Arthritis    Heart murmur    Hypertension    Mitral stenosis    moderate mitral stenosis 07/27/17 echo   Stroke [facility])    stroke x2, 2005 and 2008, some right sided weakness   Syncope and collapse 10/25/2017   Urinary incontinence    Patient Active Problem List   Diagnosis Date Noted   Aphasia 10/12/2023   UTI (urinary tract infection) 10/12/2023   Sepsis due to COVID-19 (HCC) 11/01/2021   Lactic acidosis 11/01/2021   Acute on chronic diastolic CHF (congestive heart failure) (HCC) 11/01/2021   Essential hypertension 11/01/2021   Vascular dementia (HCC) 11/01/2021   Delirium due to another medical condition 06/22/2021   Dementia with behavioral disturbance    Pressure injury of skin 06/17/2021   Protein-calorie malnutrition, severe 06/13/2021   Closed right hip fracture, initial encounter (HCC) 06/12/2021   Hip fracture (HCC) 06/12/2021   Fall at home, initial encounter    Syncope and collapse 10/25/2017   S/P total knee replacement  08/06/2017   Home Medication(s) Prior to Admission medications   Medication Sig Start Date End Date Taking? Authorizing Provider  azithromycin  (ZITHROMAX  Z-PAK) 250 MG tablet 500 mg given in ER on day 1, Give 250 mg once a day for days 2, 3, 4, and 5. 10/28/23  Yes Saeed Toren, Raynell Moder, MD  nitrofurantoin , macrocrystal-monohydrate, (MACROBID ) 100 MG capsule Take 1 capsule (100 mg total) by mouth 2 (two) times daily. 10/27/23  Yes Girl Schissler, Raynell Moder, MD  oseltamivir  (TAMIFLU ) 75 MG capsule Take 1 capsule (75 mg total) by mouth every 12 (twelve) hours. 10/27/23  Yes Keymari Sato, Raynell Moder, MD  acetaminophen  (TYLENOL ) 500 MG tablet Take 2 tablets (1,000 mg total) by mouth every 6 (six) hours as needed for mild pain, moderate pain, fever or headache. 06/25/21   Regalado, Belkys A, MD  albuterol  (VENTOLIN  HFA) 108 (90 Base) MCG/ACT inhaler Inhale 2 puffs into the lungs every 4 (four) hours as needed for wheezing or shortness of breath. 11/04/21   Singh, Prashant K, MD  aspirin  325 MG tablet Take 1 tablet (325 mg total) by mouth daily. 06/25/21   Regalado, Belkys A, MD  busPIRone  (BUSPAR ) 5 MG tablet Take 5 mg by mouth 3 (three) times daily. 09/11/23   [provider]  donepezil  (ARICEPT ) 5 MG tablet Take 5 mg by mouth in the morning and at bedtime. 09/27/23   [provider]  escitalopram  (LEXAPRO ) 20 MG tablet Take 20 mg by mouth every morning.    [provider]  fexofenadine (ALLEGRA) 180 MG tablet Take 180 mg by mouth daily as needed for allergies or rhinitis.    [provider]  LACTOBACILLUS PO Take 1 capsule by mouth in the morning and at bedtime.    [provider]  melatonin 3 MG TABS tablet Take 1 tablet (3 mg total) by mouth at bedtime. 06/25/21   Regalado, Belkys A, MD  memantine  (NAMENDA ) 10 MG tablet Take 10 mg by mouth 2 (two) times daily. 10/04/23   [provider]  metoprolol  tartrate (LOPRESSOR ) 25 MG tablet Take 0.5 tablets (12.5 mg  total) by mouth 2 (two) times daily. 06/25/21   Regalado, Belkys A, MD  Multiple Vitamin (MULTIVITAMIN WITH MINERALS) TABS tablet Take 1 tablet by mouth daily. 06/25/21   Regalado, Belkys A, MD  polyethylene glycol (MIRALAX  / GLYCOLAX ) 17 g packet Take 17 g by mouth 2 (two) times daily. 06/25/21   Regalado, Belkys A, MD  senna-docusate (SENOKOT-S) 8.6-50 MG tablet Take 1 tablet by mouth 2 (two) times daily. 06/25/21   Regalado, Belkys A, MD  traZODone  (DESYREL ) 50 MG tablet Take 50 mg by mouth at bedtime. 07/11/23   [provider]                                                                                                                                    Allergies Penicillins  Review of Systems Review of Systems As noted in HPI  Physical Exam Vital Signs  I have reviewed the triage vital signs BP (!) 140/65   Pulse (!) 113   Temp 99.8 F (37.7 C) (Oral)   Resp (!) 21   Ht 5' 4 (1.626 m)   Wt 68 kg   SpO2 90%   BMI 25.73 kg/m   Physical Exam Vitals reviewed.  Constitutional:      General: She is not in acute distress.    Appearance: She is well-developed. She is not diaphoretic.  HENT:     Head: Normocephalic and atraumatic.     Nose: Nose normal.  Eyes:     General: No scleral icterus.       Right eye: No discharge.        Left eye: No discharge.     Conjunctiva/sclera: Conjunctivae normal.     Pupils: Pupils are equal, round, and reactive to light.  Cardiovascular:     Rate and Rhythm: Normal rate and regular rhythm.     Heart sounds: No murmur heard.    No friction rub. No gallop.  Pulmonary:     Effort: Pulmonary effort is normal. No respiratory distress.     Breath sounds: Normal breath sounds. No stridor. No rales.  Abdominal:     General: There is no distension.     Palpations: Abdomen is soft.     Tenderness: There is abdominal tenderness (mild) in the suprapubic area.  Musculoskeletal:        General:  No tenderness.     Cervical back: Normal  range of motion and neck supple.  Skin:    General: Skin is warm and dry.     Findings: No erythema or rash.  Neurological:     Mental Status: She is alert. She is disoriented.     Comments: Pleasantly demented. Moving all extremities     ED Results and Treatments Labs (all labs ordered are listed, but only abnormal results are displayed) Labs Reviewed  RESP PANEL BY RT-PCR (RSV, FLU A&B, COVID)  RVPGX2 - Abnormal; Notable for the following components:      Result Value   Influenza A by PCR POSITIVE (*)    All other components within normal limits  COMPREHENSIVE METABOLIC PANEL - Abnormal; Notable for the following components:   Glucose, Bld 108 (*)    All other components within normal limits  CBC WITH DIFFERENTIAL/PLATELET - Abnormal; Notable for the following components:   Lymphs Abs 0.5 (*)    All other components within normal limits  URINALYSIS, W/ REFLEX TO CULTURE (INFECTION SUSPECTED) - Abnormal; Notable for the following components:   Nitrite POSITIVE (*)    Bacteria, UA RARE (*)    All other components within normal limits  CULTURE, BLOOD (ROUTINE X 2)  CULTURE, BLOOD (ROUTINE X 2)  PROTIME-INR  APTT  I-STAT CG4 LACTIC ACID, ED                                                                                                                         EKG  EKG Interpretation Date/Time:  Saturday October 27 2023 00:03:25 EST Ventricular Rate:  97 PR Interval:  136 QRS Duration:  73 QT Interval:  370 QTC Calculation: 470 R Axis:   144  Text Interpretation: Sinus tachycardia Atrial premature complex Right axis deviation Anteroseptal infarct, old Abnormal T, consider ischemia, diffuse leads Confirmed by Trine Likes [phone]) on 10/27/2023 2:07:38 AM       Radiology DG Chest Port 1 View Result Date: 10/27/2023 CLINICAL DATA:  Questionable sepsis - evaluate for abnormality EXAM: PORTABLE CHEST 1 VIEW COMPARISON:  10/12/2023 FINDINGS: Heart and mediastinal stones  crash that heart and mediastinal contours within normal limits. Aortic atherosclerosis. Worsening patchy bilateral airspace disease. No effusions or acute bony abnormality. IMPRESSION: Worsening patchy bilateral airspace disease concerning for pneumonia. Electronically Signed   By: Franky Crease M.D.   On: 10/27/2023 00:19    Medications Ordered in ED Medications  nitrofurantoin  (macrocrystal-monohydrate) (MACROBID ) capsule 100 mg (has no administration in time range)  azithromycin  (ZITHROMAX ) tablet 500 mg (has no administration in time range)  acetaminophen  (TYLENOL ) tablet 650 mg (has no administration in time range)  oseltamivir  (TAMIFLU ) capsule 75 mg (has no administration in time range)   Procedures Procedures  (including critical care time) Medical Decision Making / ED Course   Medical Decision Making Amount and/or Complexity of Data Reviewed Labs: ordered. Decision-making details documented in ED Course. Radiology: ordered and independent interpretation performed. Decision-making  details documented in ED Course. ECG/medicine tests: ordered and independent interpretation performed. Decision-making details documented in ED Course.  Risk OTC drugs. Prescription drug management. Decision regarding hospitalization.    Infectious workup initiated. Workup was not consistent with sepsis.  No leukocytosis or anemia.  CMP without significant electrolyte derangements or renal sufficiency.  Lactic acid normal.  UA with positive nitrites and WBCs.  No leukocytes.  Given suprapubic discomfort, will treat for likely UTI.  Given prior cultures, will treat with Macrobid  given the sensitivity to this. Chest x-ray with evidence of bilateral air disease concerning for pneumonia.  Influenza A was positive. Well on room air. Will treat with Zpack and Tamiflu .  I called and spoke to the patient's son. Patient is stable for continued outpatient management.    Final Clinical Impression(s) / ED  Diagnoses Final diagnoses:  Influenza A  Acute lower urinary tract infection   The patient appears reasonably screened and/or stabilized for discharge and I doubt any other medical condition or other [facility] requiring further screening, evaluation, or treatment in the ED at this time. I have discussed the findings, Dx and Tx plan with the patient/family who expressed understanding and agree(s) with the plan. Discharge instructions discussed at length. The patient/family was given strict return precautions who verbalized understanding of the instructions. No further questions at time of discharge.  Disposition: Discharge  Condition: Good  ED Discharge Orders          Ordered    azithromycin  (ZITHROMAX  Z-PAK) 250 MG tablet        10/27/23 0327    oseltamivir  (TAMIFLU ) 75 MG capsule  Every 12 hours        10/27/23 0327    nitrofurantoin , macrocrystal-monohydrate, (MACROBID ) 100 MG capsule  2 times daily        10/27/23 0327             Follow Up: Shelda Atlas, MD [address] [place] [address] [phone]  Call  to schedule an appointment for close follow up    This chart was dictated using voice recognition software.  Despite best efforts to proofread,  errors can occur which can change the documentation meaning.    Trine Raynell Moder, MD 10/27/23 [phone]

## 2023-10-26 NOTE — ED Triage Notes (Signed)
 Pt comes via [facility] from [facility] for dysuria and cough that has been going on for a few weeks. Pt is febrile

## 2023-10-27 ENCOUNTER — Emergency Department (HOSPITAL_COMMUNITY): Payer: Medicare Other

## 2023-10-27 ENCOUNTER — Encounter (HOSPITAL_COMMUNITY): Payer: Self-pay

## 2023-10-27 LAB — CBC WITH DIFFERENTIAL/PLATELET
Abs Immature Granulocytes: 0.04 10*3/uL (ref 0.00–0.07)
Basophils Absolute: 0 10*3/uL (ref 0.0–0.1)
Basophils Relative: 0 %
Eosinophils Absolute: 0.1 10*3/uL (ref 0.0–0.5)
Eosinophils Relative: 1 %
HCT: 38.9 % (ref 36.0–46.0)
Hemoglobin: 12.7 g/dL (ref 12.0–15.0)
Immature Granulocytes: 0 %
Lymphocytes Relative: 6 %
Lymphs Abs: 0.5 10*3/uL — ABNORMAL LOW (ref 0.7–4.0)
MCH: 28.9 pg (ref 26.0–34.0)
MCHC: 32.6 g/dL (ref 30.0–36.0)
MCV: 88.6 fL (ref 80.0–100.0)
Monocytes Absolute: 0.8 10*3/uL (ref 0.1–1.0)
Monocytes Relative: 8 %
Neutro Abs: 7.7 10*3/uL (ref 1.7–7.7)
Neutrophils Relative %: 85 %
Platelets: 233 10*3/uL (ref 150–400)
RBC: 4.39 MIL/uL (ref 3.87–5.11)
RDW: 15.3 % (ref 11.5–15.5)
WBC: 9.2 10*3/uL (ref 4.0–10.5)
nRBC: 0 % (ref 0.0–0.2)

## 2023-10-27 LAB — URINALYSIS, W/ REFLEX TO CULTURE (INFECTION SUSPECTED)
Bilirubin Urine: NEGATIVE
Glucose, UA: NEGATIVE mg/dL
Hgb urine dipstick: NEGATIVE
Ketones, ur: NEGATIVE mg/dL
Leukocytes,Ua: NEGATIVE
Nitrite: POSITIVE — AB
Protein, ur: NEGATIVE mg/dL
Specific Gravity, Urine: 1.013 (ref 1.005–1.030)
pH: 7 (ref 5.0–8.0)

## 2023-10-27 LAB — COMPREHENSIVE METABOLIC PANEL
ALT: 16 U/L (ref 0–44)
AST: 19 U/L (ref 15–41)
Albumin: 3.6 g/dL (ref 3.5–5.0)
Alkaline Phosphatase: 63 U/L (ref 38–126)
Anion gap: 11 (ref 5–15)
BUN: 10 mg/dL (ref 8–23)
CO2: 26 mmol/L (ref 22–32)
Calcium: 9.5 mg/dL (ref 8.9–10.3)
Chloride: 104 mmol/L (ref 98–111)
Creatinine, Ser: 0.79 mg/dL (ref 0.44–1.00)
GFR, Estimated: >60 mL/min (ref >60)
Glucose, Bld: 108 mg/dL — ABNORMAL HIGH (ref 70–99)
Potassium: 3.8 mmol/L (ref 3.5–5.1)
Sodium: 141 mmol/L (ref 135–145)
Total Bilirubin: 0.5 mg/dL (ref 0.0–1.2)
Total Protein: 7.3 g/dL (ref 6.5–8.1)

## 2023-10-27 LAB — RESP PANEL BY RT-PCR (RSV, FLU A&B, COVID)  RVPGX2
Influenza A by PCR: POSITIVE — AB
Influenza B by PCR: NEGATIVE
Resp Syncytial Virus by PCR: NEGATIVE
SARS Coronavirus 2 by RT PCR: NEGATIVE

## 2023-10-27 LAB — PROTIME-INR
INR: 1 (ref 0.8–1.2)
Prothrombin Time: 13.6 s (ref 11.4–15.2)

## 2023-10-27 LAB — APTT: aPTT: 31 s (ref 24–36)

## 2023-10-27 LAB — I-STAT CG4 LACTIC ACID, ED: Lactic Acid, Venous: 1.6 mmol/L (ref 0.5–1.9)

## 2023-10-27 MED ORDER — AZITHROMYCIN 250 MG PO TABS: ORAL_TABLET | ORAL | 0 refills | Status: DC

## 2023-10-27 MED ORDER — AZITHROMYCIN 250 MG PO TABS
500.0000 mg | ORAL_TABLET | Freq: Once | ORAL | Status: AC
  Administered 2023-10-27: 500 mg via ORAL
  Filled 2023-10-27: qty 2

## 2023-10-27 MED ORDER — NITROFURANTOIN MONOHYD MACRO 100 MG PO CAPS: 100.0000 mg | ORAL_CAPSULE | Freq: Two times a day (BID) | ORAL | 0 refills | Status: DC

## 2023-10-27 MED ORDER — OSELTAMIVIR PHOSPHATE 75 MG PO CAPS
75.0000 mg | ORAL_CAPSULE | Freq: Once | ORAL | Status: AC
  Administered 2023-10-27: 75 mg via ORAL
  Filled 2023-10-27: qty 1

## 2023-10-27 MED ORDER — ACETAMINOPHEN 325 MG PO TABS
650.0000 mg | ORAL_TABLET | Freq: Once | ORAL | Status: AC
  Administered 2023-10-27: 650 mg via ORAL
  Filled 2023-10-27: qty 2

## 2023-10-27 MED ORDER — OSELTAMIVIR PHOSPHATE 75 MG PO CAPS: 75.0000 mg | ORAL_CAPSULE | Freq: Two times a day (BID) | ORAL | 0 refills | Status: DC

## 2023-10-27 MED ORDER — NITROFURANTOIN MONOHYD MACRO 100 MG PO CAPS
100.0000 mg | ORAL_CAPSULE | Freq: Once | ORAL | Status: AC
  Administered 2023-10-27: 100 mg via ORAL
  Filled 2023-10-27 (×2): qty 1

## 2023-10-27 NOTE — ED Notes (Signed)
 Unsuccessful attempt at calling report to facility.

## 2023-11-01 ENCOUNTER — Emergency Department (HOSPITAL_COMMUNITY): Payer: Medicare Other

## 2023-11-01 ENCOUNTER — Inpatient Hospital Stay (HOSPITAL_COMMUNITY)
Admission: EM | Admit: 2023-11-01 | Discharge: 2023-11-08 | DRG: 871 | Disposition: A | Discharge status: Skilled Nursing Facility | Payer: Medicare Other | Source: Skilled Nursing Facility | Attending: Internal Medicine | Admitting: Internal Medicine

## 2023-11-01 ENCOUNTER — Other Ambulatory Visit: Payer: Self-pay

## 2023-11-01 ENCOUNTER — Encounter (HOSPITAL_COMMUNITY): Payer: Self-pay

## 2023-11-01 DIAGNOSIS — R0902 Hypoxemia: Secondary | ICD-10-CM | POA: Diagnosis present

## 2023-11-01 DIAGNOSIS — G4700 Insomnia, unspecified: Secondary | ICD-10-CM | POA: Diagnosis present

## 2023-11-01 DIAGNOSIS — Z880 Allergy status to penicillin: Secondary | ICD-10-CM

## 2023-11-01 DIAGNOSIS — J1000 Influenza due to other identified influenza virus with unspecified type of pneumonia: Secondary | ICD-10-CM | POA: Diagnosis present

## 2023-11-01 DIAGNOSIS — Z9842 Cataract extraction status, left eye: Secondary | ICD-10-CM

## 2023-11-01 DIAGNOSIS — R4701 Aphasia: Secondary | ICD-10-CM | POA: Diagnosis present

## 2023-11-01 DIAGNOSIS — Z8249 Family history of ischemic heart disease and other diseases of the circulatory system: Secondary | ICD-10-CM | POA: Diagnosis not present

## 2023-11-01 DIAGNOSIS — F17210 Nicotine dependence, cigarettes, uncomplicated: Secondary | ICD-10-CM | POA: Diagnosis present

## 2023-11-01 DIAGNOSIS — Z96641 Presence of right artificial hip joint: Secondary | ICD-10-CM | POA: Diagnosis present

## 2023-11-01 DIAGNOSIS — J90 Pleural effusion, not elsewhere classified: Secondary | ICD-10-CM | POA: Diagnosis not present

## 2023-11-01 DIAGNOSIS — I5033 Acute on chronic diastolic (congestive) heart failure: Secondary | ICD-10-CM | POA: Diagnosis present

## 2023-11-01 DIAGNOSIS — E8809 Other disorders of plasma-protein metabolism, not elsewhere classified: Secondary | ICD-10-CM | POA: Diagnosis present

## 2023-11-01 DIAGNOSIS — F01A4 Vascular dementia, mild, with anxiety: Secondary | ICD-10-CM | POA: Diagnosis present

## 2023-11-01 DIAGNOSIS — Z993 Dependence on wheelchair: Secondary | ICD-10-CM

## 2023-11-01 DIAGNOSIS — I272 Pulmonary hypertension, unspecified: Secondary | ICD-10-CM | POA: Diagnosis present

## 2023-11-01 DIAGNOSIS — A419 Sepsis, unspecified organism: Secondary | ICD-10-CM | POA: Diagnosis not present

## 2023-11-01 DIAGNOSIS — R5381 Other malaise: Secondary | ICD-10-CM | POA: Diagnosis present

## 2023-11-01 DIAGNOSIS — F419 Anxiety disorder, unspecified: Secondary | ICD-10-CM | POA: Diagnosis not present

## 2023-11-01 DIAGNOSIS — R578 Other shock: Secondary | ICD-10-CM | POA: Diagnosis not present

## 2023-11-01 DIAGNOSIS — Z6825 Body mass index (BMI) 25.0-25.9, adult: Secondary | ICD-10-CM | POA: Diagnosis not present

## 2023-11-01 DIAGNOSIS — Z87440 Personal history of urinary (tract) infections: Secondary | ICD-10-CM

## 2023-11-01 DIAGNOSIS — F01A18 Vascular dementia, mild, with other behavioral disturbance: Secondary | ICD-10-CM | POA: Diagnosis present

## 2023-11-01 DIAGNOSIS — J189 Pneumonia, unspecified organism: Secondary | ICD-10-CM | POA: Diagnosis present

## 2023-11-01 DIAGNOSIS — R64 Cachexia: Secondary | ICD-10-CM | POA: Diagnosis present

## 2023-11-01 DIAGNOSIS — I080 Rheumatic disorders of both mitral and aortic valves: Secondary | ICD-10-CM | POA: Diagnosis present

## 2023-11-01 DIAGNOSIS — R6521 Severe sepsis with septic shock: Secondary | ICD-10-CM | POA: Diagnosis present

## 2023-11-01 DIAGNOSIS — E43 Unspecified severe protein-calorie malnutrition: Secondary | ICD-10-CM | POA: Diagnosis present

## 2023-11-01 DIAGNOSIS — Z96653 Presence of artificial knee joint, bilateral: Secondary | ICD-10-CM | POA: Diagnosis present

## 2023-11-01 DIAGNOSIS — Z7982 Long term (current) use of aspirin: Secondary | ICD-10-CM

## 2023-11-01 DIAGNOSIS — Z8673 Personal history of transient ischemic attack (TIA), and cerebral infarction without residual deficits: Secondary | ICD-10-CM | POA: Diagnosis not present

## 2023-11-01 DIAGNOSIS — R579 Shock, unspecified: Secondary | ICD-10-CM | POA: Diagnosis not present

## 2023-11-01 DIAGNOSIS — J9601 Acute respiratory failure with hypoxia: Secondary | ICD-10-CM | POA: Diagnosis not present

## 2023-11-01 DIAGNOSIS — I10 Essential (primary) hypertension: Secondary | ICD-10-CM | POA: Diagnosis not present

## 2023-11-01 DIAGNOSIS — J111 Influenza due to unidentified influenza virus with other respiratory manifestations: Principal | ICD-10-CM

## 2023-11-01 DIAGNOSIS — Z9841 Cataract extraction status, right eye: Secondary | ICD-10-CM

## 2023-11-01 DIAGNOSIS — I110 Hypertensive heart disease with heart failure: Secondary | ICD-10-CM | POA: Diagnosis present

## 2023-11-01 DIAGNOSIS — Z1152 Encounter for screening for COVID-19: Secondary | ICD-10-CM | POA: Diagnosis not present

## 2023-11-01 DIAGNOSIS — Z79899 Other long term (current) drug therapy: Secondary | ICD-10-CM

## 2023-11-01 LAB — I-STAT VENOUS BLOOD GAS, ED
Acid-Base Excess: 3 mmol/L — ABNORMAL HIGH (ref 0.0–2.0)
Bicarbonate: 27 mmol/L (ref 20.0–28.0)
Calcium, Ion: 1.11 mmol/L — ABNORMAL LOW (ref 1.15–1.40)
HCT: 34 % — ABNORMAL LOW (ref 36.0–46.0)
Hemoglobin: 11.6 g/dL — ABNORMAL LOW (ref 12.0–15.0)
O2 Saturation: 78 %
Potassium: 3.6 mmol/L (ref 3.5–5.1)
Sodium: 140 mmol/L (ref 135–145)
TCO2: 28 mmol/L (ref 22–32)
pCO2, Ven: 39.3 mm[Hg] — ABNORMAL LOW (ref 44–60)
pH, Ven: 7.446 — ABNORMAL HIGH (ref 7.25–7.43)
pO2, Ven: 40 mm[Hg] (ref 32–45)

## 2023-11-01 LAB — COMPREHENSIVE METABOLIC PANEL
ALT: 16 U/L (ref 0–44)
AST: 21 U/L (ref 15–41)
Albumin: 2.7 g/dL — ABNORMAL LOW (ref 3.5–5.0)
Alkaline Phosphatase: 74 U/L (ref 38–126)
Anion gap: 9 (ref 5–15)
BUN: 11 mg/dL (ref 8–23)
CO2: 26 mmol/L (ref 22–32)
Calcium: 8.7 mg/dL — ABNORMAL LOW (ref 8.9–10.3)
Chloride: 103 mmol/L (ref 98–111)
Creatinine, Ser: 0.83 mg/dL (ref 0.44–1.00)
GFR, Estimated: >60 mL/min (ref >60)
Glucose, Bld: 129 mg/dL — ABNORMAL HIGH (ref 70–99)
Potassium: 3.6 mmol/L (ref 3.5–5.1)
Sodium: 138 mmol/L (ref 135–145)
Total Bilirubin: 1 mg/dL (ref 0.0–1.2)
Total Protein: 6.8 g/dL (ref 6.5–8.1)

## 2023-11-01 LAB — URINALYSIS, ROUTINE W REFLEX MICROSCOPIC
Bilirubin Urine: NEGATIVE
Glucose, UA: NEGATIVE mg/dL
Hgb urine dipstick: NEGATIVE
Ketones, ur: NEGATIVE mg/dL
Leukocytes,Ua: NEGATIVE
Nitrite: NEGATIVE
Protein, ur: NEGATIVE mg/dL
Specific Gravity, Urine: 1.017 (ref 1.005–1.030)
pH: 5 (ref 5.0–8.0)

## 2023-11-01 LAB — CBC
HCT: 34.2 % — ABNORMAL LOW (ref 36.0–46.0)
Hemoglobin: 11.2 g/dL — ABNORMAL LOW (ref 12.0–15.0)
MCH: 28.7 pg (ref 26.0–34.0)
MCHC: 32.7 g/dL (ref 30.0–36.0)
MCV: 87.7 fL (ref 80.0–100.0)
Platelets: 228 10*3/uL (ref 150–400)
RBC: 3.9 MIL/uL (ref 3.87–5.11)
RDW: 15.2 % (ref 11.5–15.5)
WBC: 17.8 10*3/uL — ABNORMAL HIGH (ref 4.0–10.5)
nRBC: 0 % (ref 0.0–0.2)

## 2023-11-01 LAB — CBC WITH DIFFERENTIAL/PLATELET
Abs Immature Granulocytes: 0.14 10*3/uL — ABNORMAL HIGH (ref 0.00–0.07)
Basophils Absolute: 0 10*3/uL (ref 0.0–0.1)
Basophils Relative: 0 %
Eosinophils Absolute: 0 10*3/uL (ref 0.0–0.5)
Eosinophils Relative: 0 %
HCT: 33.5 % — ABNORMAL LOW (ref 36.0–46.0)
Hemoglobin: 11.3 g/dL — ABNORMAL LOW (ref 12.0–15.0)
Immature Granulocytes: 1 %
Lymphocytes Relative: 6 %
Lymphs Abs: 1 10*3/uL (ref 0.7–4.0)
MCH: 29.3 pg (ref 26.0–34.0)
MCHC: 33.7 g/dL (ref 30.0–36.0)
MCV: 86.8 fL (ref 80.0–100.0)
Monocytes Absolute: 1.2 10*3/uL — ABNORMAL HIGH (ref 0.1–1.0)
Monocytes Relative: 6 %
Neutro Abs: 15.5 10*3/uL — ABNORMAL HIGH (ref 1.7–7.7)
Neutrophils Relative %: 87 %
Platelets: 219 10*3/uL (ref 150–400)
RBC: 3.86 MIL/uL — ABNORMAL LOW (ref 3.87–5.11)
RDW: 15.3 % (ref 11.5–15.5)
WBC: 17.9 10*3/uL — ABNORMAL HIGH (ref 4.0–10.5)
nRBC: 0 % (ref 0.0–0.2)

## 2023-11-01 LAB — CULTURE, BLOOD (ROUTINE X 2)
Culture: NO GROWTH
Culture: NO GROWTH
Special Requests: ADEQUATE
Special Requests: ADEQUATE

## 2023-11-01 LAB — BLOOD GAS, ARTERIAL
Acid-Base Excess: 0.3 mmol/L (ref 0.0–2.0)
Bicarbonate: 24.6 mmol/L (ref 20.0–28.0)
Drawn by: 54887
O2 Saturation: 99.6 %
Patient temperature: 37
pCO2 arterial: 38 mm[Hg] (ref 32–48)
pH, Arterial: 7.42 (ref 7.35–7.45)
pO2, Arterial: 103 mm[Hg] (ref 83–108)

## 2023-11-01 LAB — CREATININE, SERUM
Creatinine, Ser: 0.81 mg/dL (ref 0.44–1.00)
GFR, Estimated: >60 mL/min (ref >60)

## 2023-11-01 LAB — TROPONIN I (HIGH SENSITIVITY)
Troponin I (High Sensitivity): 7 ng/L (ref <18)
Troponin I (High Sensitivity): 7 ng/L (ref <18)

## 2023-11-01 LAB — I-STAT CG4 LACTIC ACID, ED: Lactic Acid, Venous: 2 mmol/L (ref 0.5–1.9)

## 2023-11-01 MED ORDER — OSELTAMIVIR PHOSPHATE 30 MG PO CAPS
30.0000 mg | ORAL_CAPSULE | Freq: Two times a day (BID) | ORAL | Status: AC
  Administered 2023-11-01 – 2023-11-03 (×4): 30 mg via ORAL
  Filled 2023-11-01 (×4): qty 1

## 2023-11-01 MED ORDER — POLYETHYLENE GLYCOL 3350 17 G PO PACK: 17.0000 g | PACK | Freq: Every day | ORAL | Status: DC | PRN

## 2023-11-01 MED ORDER — SODIUM CHLORIDE 0.9 % IV BOLUS
1000.0000 mL | Freq: Once | INTRAVENOUS | Status: AC
  Administered 2023-11-01: 1000 mL via INTRAVENOUS

## 2023-11-01 MED ORDER — MEMANTINE HCL 10 MG PO TABS
10.0000 mg | ORAL_TABLET | Freq: Two times a day (BID) | ORAL | Status: DC
  Administered 2023-11-01 – 2023-11-08 (×14): 10 mg via ORAL
  Filled 2023-11-01 (×16): qty 1

## 2023-11-01 MED ORDER — SODIUM CHLORIDE 0.9 % IV SOLN
2.0000 g | INTRAVENOUS | Status: DC
  Administered 2023-11-02: 2 g via INTRAVENOUS
  Filled 2023-11-01: qty 20

## 2023-11-01 MED ORDER — IPRATROPIUM-ALBUTEROL 0.5-2.5 (3) MG/3ML IN SOLN
3.0000 mL | Freq: Four times a day (QID) | RESPIRATORY_TRACT | Status: DC
  Administered 2023-11-01 – 2023-11-02 (×2): 3 mL via RESPIRATORY_TRACT
  Filled 2023-11-01 (×2): qty 3

## 2023-11-01 MED ORDER — ADULT MULTIVITAMIN W/MINERALS CH
1.0000 | ORAL_TABLET | Freq: Every day | ORAL | Status: DC
  Administered 2023-11-02 – 2023-11-08 (×7): 1 via ORAL
  Filled 2023-11-01 (×7): qty 1

## 2023-11-01 MED ORDER — ACETAMINOPHEN 500 MG PO TABS: 1000.0000 mg | ORAL_TABLET | Freq: Four times a day (QID) | ORAL | Status: DC | PRN

## 2023-11-01 MED ORDER — OSELTAMIVIR PHOSPHATE 75 MG PO CAPS
75.0000 mg | ORAL_CAPSULE | Freq: Two times a day (BID) | ORAL | Status: DC
Start: 2023-11-01 — End: 2023-11-01

## 2023-11-01 MED ORDER — SODIUM CHLORIDE 0.9 % IV SOLN
2.0000 g | Freq: Once | INTRAVENOUS | Status: AC
  Administered 2023-11-01: 2 g via INTRAVENOUS
  Filled 2023-11-01: qty 20

## 2023-11-01 MED ORDER — METOPROLOL TARTRATE 25 MG PO TABS
25.0000 mg | ORAL_TABLET | Freq: Two times a day (BID) | ORAL | Status: DC
  Administered 2023-11-01 – 2023-11-03 (×5): 25 mg via ORAL
  Filled 2023-11-01 (×5): qty 1

## 2023-11-01 MED ORDER — LACTATED RINGERS IV SOLN: INTRAVENOUS | Status: DC

## 2023-11-01 MED ORDER — LACTATED RINGERS IV BOLUS
1000.0000 mL | Freq: Once | INTRAVENOUS | Status: AC
  Administered 2023-11-01: 1000 mL via INTRAVENOUS

## 2023-11-01 MED ORDER — LEVALBUTEROL HCL 0.63 MG/3ML IN NEBU
0.6300 mg | INHALATION_SOLUTION | Freq: Four times a day (QID) | RESPIRATORY_TRACT | Status: DC
  Administered 2023-11-01 (×2): 0.63 mg via RESPIRATORY_TRACT
  Filled 2023-11-01 (×2): qty 3

## 2023-11-01 MED ORDER — MELATONIN 3 MG PO TABS
3.0000 mg | ORAL_TABLET | Freq: Every day | ORAL | Status: DC
  Administered 2023-11-01 – 2023-11-07 (×7): 3 mg via ORAL
  Filled 2023-11-01 (×7): qty 1

## 2023-11-01 MED ORDER — ACETAMINOPHEN 650 MG RE SUPP: 650.0000 mg | Freq: Four times a day (QID) | RECTAL | Status: DC | PRN

## 2023-11-01 MED ORDER — ASPIRIN 325 MG PO TABS
325.0000 mg | ORAL_TABLET | Freq: Every day | ORAL | Status: DC
  Administered 2023-11-02 – 2023-11-08 (×7): 325 mg via ORAL
  Filled 2023-11-01 (×7): qty 1

## 2023-11-01 MED ORDER — SENNOSIDES-DOCUSATE SODIUM 8.6-50 MG PO TABS
1.0000 | ORAL_TABLET | Freq: Two times a day (BID) | ORAL | Status: DC
  Administered 2023-11-01 – 2023-11-08 (×11): 1 via ORAL
  Filled 2023-11-01 (×12): qty 1

## 2023-11-01 MED ORDER — IPRATROPIUM-ALBUTEROL 0.5-2.5 (3) MG/3ML IN SOLN
3.0000 mL | Freq: Once | RESPIRATORY_TRACT | Status: AC
  Administered 2023-11-01: 3 mL via RESPIRATORY_TRACT
  Filled 2023-11-01: qty 3

## 2023-11-01 MED ORDER — DONEPEZIL HCL 10 MG PO TABS
5.0000 mg | ORAL_TABLET | Freq: Every day | ORAL | Status: DC
  Administered 2023-11-02 – 2023-11-07 (×6): 5 mg via ORAL
  Filled 2023-11-01 (×8): qty 1

## 2023-11-01 MED ORDER — ENOXAPARIN SODIUM 40 MG/0.4ML IJ SOSY
40.0000 mg | PREFILLED_SYRINGE | INTRAMUSCULAR | Status: DC
  Administered 2023-11-01 – 2023-11-07 (×7): 40 mg via SUBCUTANEOUS
  Filled 2023-11-01 (×7): qty 0.4

## 2023-11-01 MED ORDER — METOPROLOL TARTRATE 25 MG PO TABS: 25.0000 mg | ORAL_TABLET | Freq: Two times a day (BID) | ORAL | Status: DC

## 2023-11-01 MED ORDER — SODIUM CHLORIDE 0.9 % IV SOLN
500.0000 mg | Freq: Once | INTRAVENOUS | Status: AC
  Administered 2023-11-01: 500 mg via INTRAVENOUS
  Filled 2023-11-01: qty 5

## 2023-11-01 MED ORDER — ONDANSETRON HCL 4 MG PO TABS: 4.0000 mg | ORAL_TABLET | Freq: Four times a day (QID) | ORAL | Status: DC | PRN

## 2023-11-01 MED ORDER — METRONIDAZOLE 500 MG/100ML IV SOLN
500.0000 mg | Freq: Two times a day (BID) | INTRAVENOUS | Status: DC
  Administered 2023-11-01 – 2023-11-03 (×4): 500 mg via INTRAVENOUS
  Filled 2023-11-01 (×4): qty 100

## 2023-11-01 MED ORDER — ONDANSETRON HCL 4 MG/2ML IJ SOLN
4.0000 mg | Freq: Four times a day (QID) | INTRAMUSCULAR | Status: DC | PRN
Start: 2023-11-01 — End: 2023-11-08
  Administered 2023-11-03: 4 mg via INTRAVENOUS
  Filled 2023-11-01: qty 2

## 2023-11-01 MED ORDER — BUSPIRONE HCL 5 MG PO TABS
5.0000 mg | ORAL_TABLET | Freq: Three times a day (TID) | ORAL | Status: DC
  Administered 2023-11-01 – 2023-11-08 (×21): 5 mg via ORAL
  Filled 2023-11-01 (×21): qty 1

## 2023-11-01 MED ORDER — ESCITALOPRAM OXALATE 10 MG PO TABS
20.0000 mg | ORAL_TABLET | Freq: Every morning | ORAL | Status: DC
Start: 2023-11-02 — End: 2023-11-08
  Administered 2023-11-02 – 2023-11-08 (×7): 20 mg via ORAL
  Filled 2023-11-01: qty 1
  Filled 2023-11-01 (×2): qty 2
  Filled 2023-11-01: qty 1
  Filled 2023-11-01 (×3): qty 2

## 2023-11-01 MED ORDER — ACETAMINOPHEN 325 MG PO TABS
650.0000 mg | ORAL_TABLET | Freq: Four times a day (QID) | ORAL | Status: DC | PRN
  Administered 2023-11-01 – 2023-11-03 (×2): 650 mg via ORAL
  Filled 2023-11-01 (×2): qty 2

## 2023-11-01 MED ORDER — TRAZODONE HCL 50 MG PO TABS
50.0000 mg | ORAL_TABLET | Freq: Every day | ORAL | Status: DC
  Administered 2023-11-01 – 2023-11-03 (×3): 50 mg via ORAL
  Filled 2023-11-01 (×3): qty 1

## 2023-11-01 NOTE — Sepsis Progress Note (Signed)
Notified bedside nurse of need to draw lactic acid.

## 2023-11-01 NOTE — Progress Notes (Deleted)
Pharmacy Antibiotic Note  Christine Christensen is a 78 y.o. female admitted on 11/01/2023 with  aspiration pneumonia .  Pharmacy has been consulted for Unasyn dosing.  January 2025 ESBL E. Coli UTI treated with Keflex. Macrobid prescription dispensed 2/8. Recent ED visit with Influenza A last week, discharged back to SNF. Now presenting with pneumonia. WBC 17.9, LA 2.0, afebrile. Scr 0.83.   Plan: Unasyn 3g IV q6h Monitor clinical status, renal function, culture data  Height: 5\' 4"  (162.6 cm) Weight: 68 kg (149 lb 14.6 oz) IBW/kg (Calculated) : 54.7  Temp (24hrs), Avg:98.9 F (37.2 C), Min:98.9 F (37.2 C), Max:98.9 F (37.2 C)  Recent Labs  Lab 10/27/23 0034 10/27/23 0043 11/01/23 1235 11/01/23 1404  WBC 9.2  --  17.9*  --   CREATININE 0.79  --  0.83  --   LATICACIDVEN  --  1.6  --  2.0*    Estimated Creatinine Clearance: 52.9 mL/min (by C-G formula based on SCr of 0.83 mg/dL).    Allergies  Allergen Reactions   Penicillins Other (See Comments)    Unknown reaction    Antimicrobials this admission: Ceftriaxone and azithromycin x1 2/13 in ED Unasyn 2/13 >>   Dose adjustments this admission: N/a  Microbiology results: 2/13 BCx: sent 2/13 Sputum: sent    Thank you for allowing pharmacy to be a part of this patient's care.  Ernestene Kiel, PharmD PGY1 Pharmacy Resident  Please check AMION for all [facility]acy phone numbers After 10:00 PM, call Main Pharmacy [phone] 11/01/2023 3:03 PM

## 2023-11-01 NOTE — H&P (Signed)
History and Physical    Christine Christensen:[id number] DOB: [birth year] DOA: 11/01/2023  Referring MD/NP/PA: EDP PCP:  [facility]  Chief Complaint: Fever cough, shortness of breath  HPI: Christine Christensen is a 77/F with vascular dementia, CVA, chronic right lower extremity weakness, hypertension was recently hospitalized at [facility] for aphasia, cognitive deficits, eventually diagnosed with E. coli UTI and discharged back to SNF on 1/26, after this she was back in the ER 2/7 with cough and fever, diagnosed with influenza A, discharged back to her SNF. -Back in the ER today with cough, fever, shortness of breath. -Patient is a very poor historian ED Course: Tachycardic, mildly hypoxic, labs noted WBC of 17.9, creatinine 0.8, troponin 7, lactic acid 2.0, chest x-ray with persistent bibasilar opacities  Review of Systems: Limited on account of dementia and cognitive deficits  Past Medical History:  Diagnosis Date   Anemia    history of anemia   Aortic stenosis    mild aortic stenosis 07/27/17 echo   Arthritis    Heart murmur    Hypertension    Mitral stenosis    moderate mitral stenosis 07/27/17 echo   Stroke [facility])    stroke x2, 2005 and 2008, some right sided weakness   Syncope and collapse 10/25/2017   Urinary incontinence     Past Surgical History:  Procedure Laterality Date   CATARACT EXTRACTION, BILATERAL Bilateral    CHOLECYSTECTOMY     HIP ARTHROPLASTY Right 06/14/2021   Procedure: ARTHROPLASTY BIPOLAR HIP (HEMIARTHROPLASTY);  Surgeon: Joen Laura, MD;  Location: [facility] OR;  Service: Orthopedics;  Laterality: Right;   TONSILLECTOMY     TOTAL KNEE ARTHROPLASTY Right    right knee 2006   TOTAL KNEE ARTHROPLASTY Left 08/06/2017   Procedure: LEFT TOTAL KNEE ARTHROPLASTY;  Surgeon: Dannielle Huh, MD;  Location: [facility] OR;  Service: Orthopedics;  Laterality: Left;   TUBAL LIGATION       reports that she has quit smoking. Her smoking use included  cigarettes. She has a 28 pack-year smoking history. She has never used smokeless tobacco. She reports that she does not currently use alcohol. She reports that she does not currently use drugs after having used the following drugs: Marijuana.  Allergies  Allergen Reactions   Penicillins Other (See Comments)    Unknown reaction    Family History  Problem Relation Age of Onset   Dementia Mother    Heart disease Father    Cerebral palsy Sister      Prior to Admission medications   Medication Sig Start Date End Date Taking? Authorizing Provider  acetaminophen (TYLENOL) 500 MG tablet Take 2 tablets (1,000 mg total) by mouth every 6 (six) hours as needed for mild pain, moderate pain, fever or headache. 06/25/21  Yes Regalado, Belkys A, MD  albuterol (VENTOLIN HFA) 108 (90 Base) MCG/ACT inhaler Inhale 2 puffs into the lungs every 4 (four) hours as needed for wheezing or shortness of breath. 11/04/21  Yes Leroy Sea, MD  aspirin 325 MG tablet Take 1 tablet (325 mg total) by mouth daily. 06/25/21  Yes Regalado, Belkys A, MD  azithromycin (ZITHROMAX Z-PAK) 250 MG tablet 500 mg given in ER on day 1, Give 250 mg once a day for days 2, 3, 4, and 5. 10/28/23  Yes Cardama, Amadeo Garnet, MD  busPIRone (BUSPAR) 5 MG tablet Take 5 mg by mouth 3 (three) times daily. 09/11/23  Yes [provider]  donepezil (ARICEPT) 5 MG tablet  Take 5 mg by mouth in the morning and at bedtime. 09/27/23  Yes [provider]  escitalopram (LEXAPRO) 20 MG tablet Take 20 mg by mouth every morning.   Yes [provider]  fexofenadine (ALLEGRA) 180 MG tablet Take 180 mg by mouth daily as needed for allergies or rhinitis.   Yes [provider]  LACTOBACILLUS PO Take 1 capsule by mouth in the morning and at bedtime.   Yes [provider]  melatonin 3 MG TABS tablet Take 1 tablet (3 mg total) by mouth at bedtime. 06/25/21  Yes Regalado, Belkys A, MD  memantine (NAMENDA) 10 MG tablet Take  10 mg by mouth 2 (two) times daily. 10/04/23  Yes [provider]  metoprolol tartrate (LOPRESSOR) 25 MG tablet Take 0.5 tablets (12.5 mg total) by mouth 2 (two) times daily. 06/25/21  Yes Regalado, Belkys A, MD  Multiple Vitamin (MULTIVITAMIN WITH MINERALS) TABS tablet Take 1 tablet by mouth daily. 06/25/21  Yes Regalado, Belkys A, MD  nitrofurantoin, macrocrystal-monohydrate, (MACROBID) 100 MG capsule Take 1 capsule (100 mg total) by mouth 2 (two) times daily. 10/27/23  Yes Cardama, Amadeo Garnet, MD  oseltamivir (TAMIFLU) 75 MG capsule Take 1 capsule (75 mg total) by mouth every 12 (twelve) hours. 10/27/23  Yes Cardama, Amadeo Garnet, MD  polyethylene glycol (MIRALAX / GLYCOLAX) 17 g packet Take 17 g by mouth 2 (two) times daily. 06/25/21  Yes Regalado, Belkys A, MD  senna-docusate (SENOKOT-S) 8.6-50 MG tablet Take 1 tablet by mouth 2 (two) times daily. 06/25/21  Yes Regalado, Belkys A, MD  traZODone (DESYREL) 50 MG tablet Take 50 mg by mouth at bedtime. 07/11/23  Yes [provider]    Physical Exam: Vitals:   11/01/23 1300 11/01/23 1330 11/01/23 1400 11/01/23 1430  BP: 108/72 104/74 123/67 113/89  Pulse: (!) 107 (!) 139 92 (!) 107  Resp: (!) 22  (!) 22 18  Temp:      TempSrc:      SpO2: 92% 93% 94% 94%  Weight:      Height:        Chronically ill elderly female sitting up in bed, AAO x 2, moderate cognitive deficits HEENT: No JVD CVS: S1-S2, regular rhythm, tachycardia Lungs: Few scattered rhonchi at the left base Abdomen: Soft, nontender, bowel sounds present Extremities: No edema Neuro: Chronic right leg weakness with contractures  Labs on Admission: I have personally reviewed following labs and imaging studies  CBC: Recent Labs  Lab 10/27/23 0034 11/01/23 1235 11/01/23 1243  WBC 9.2 17.9*  --   NEUTROABS 7.7 15.5*  --   HGB 12.7 11.3* 11.6*  HCT 38.9 33.5* 34.0*  MCV 88.6 86.8  --   PLT 233 219  --    Basic Metabolic Panel: Recent Labs  Lab  10/27/23 0034 11/01/23 1235 11/01/23 1243  NA 141 138 140  K 3.8 3.6 3.6  CL 104 103  --   CO2 26 26  --   GLUCOSE 108* 129*  --   BUN 10 11  --   CREATININE 0.79 0.83  --   CALCIUM 9.5 8.7*  --    GFR: Estimated Creatinine Clearance: 52.9 mL/min (by C-G formula based on SCr of 0.83 mg/dL). Liver Function Tests: Recent Labs  Lab 10/27/23 0034 11/01/23 1235  AST 19 21  ALT 16 16  ALKPHOS 63 74  BILITOT 0.5 1.0  PROT 7.3 6.8  ALBUMIN 3.6 2.7*   No results for input(s): "LIPASE", "AMYLASE" in the  last 168 hours. No results for input(s): "AMMONIA" in the last 168 hours. Coagulation Profile: Recent Labs  Lab 10/27/23 0034  INR 1.0   Cardiac Enzymes: No results for input(s): "CKTOTAL", "CKMB", "CKMBINDEX", "TROPONINI" in the last 168 hours. BNP (last 3 results) No results for input(s): "PROBNP" in the last 8760 hours. HbA1C: No results for input(s): "HGBA1C" in the last 72 hours. CBG: No results for input(s): "GLUCAP" in the last 168 hours. Lipid Profile: No results for input(s): "CHOL", "HDL", "LDLCALC", "TRIG", "CHOLHDL", "LDLDIRECT" in the last 72 hours. Thyroid Function Tests: No results for input(s): "TSH", "T4TOTAL", "FREET4", "T3FREE", "THYROIDAB" in the last 72 hours. Anemia Panel: No results for input(s): "VITAMINB12", "FOLATE", "FERRITIN", "TIBC", "IRON", "RETICCTPCT" in the last 72 hours. Urine analysis:    Component Value Date/Time   COLORURINE YELLOW 10/27/2023 0148   APPEARANCEUR CLEAR 10/27/2023 0148   LABSPEC 1.013 10/27/2023 0148   PHURINE 7.0 10/27/2023 0148   GLUCOSEU NEGATIVE 10/27/2023 0148   HGBUR NEGATIVE 10/27/2023 0148   BILIRUBINUR NEGATIVE 10/27/2023 0148   KETONESUR NEGATIVE 10/27/2023 0148   PROTEINUR NEGATIVE 10/27/2023 0148   NITRITE POSITIVE (A) 10/27/2023 0148   LEUKOCYTESUR NEGATIVE 10/27/2023 0148   Sepsis Labs: @LABRCNTIP (procalcitonin:4,lacticidven:4) ) Recent Results (from the past 240 hours)  Blood Culture (routine x  2)     Status: None   Collection Time: 10/27/23 12:34 AM   Specimen: BLOOD RIGHT HAND  Result Value Ref Range Status   Specimen Description BLOOD RIGHT HAND  Final   Special Requests   Final    BOTTLES DRAWN AEROBIC AND ANAEROBIC Blood Culture adequate volume   Culture   Final    NO GROWTH 5 DAYS Performed at [facility], [address] [place], [place] [address]    Report Status 11/01/2023 FINAL  Final  Blood Culture (routine x 2)     Status: None   Collection Time: 10/27/23 12:34 AM   Specimen: BLOOD RIGHT FOREARM  Result Value Ref Range Status   Specimen Description BLOOD RIGHT FOREARM  Final   Special Requests   Final    BOTTLES DRAWN AEROBIC AND ANAEROBIC Blood Culture adequate volume   Culture   Final    NO GROWTH 5 DAYS Performed at [facility], [address] [place], [place] [address]    Report Status 11/01/2023 FINAL  Final  Resp panel by RT-PCR (RSV, Flu A&B, Covid) Anterior Nasal Swab     Status: Abnormal   Collection Time: 10/27/23  1:02 AM   Specimen: Anterior Nasal Swab  Result Value Ref Range Status   SARS Coronavirus 2 by RT PCR NEGATIVE NEGATIVE Final   Influenza A by PCR POSITIVE (A) NEGATIVE Final   Influenza B by PCR NEGATIVE NEGATIVE Final    Comment: (NOTE) The Xpert Xpress SARS-CoV-2/FLU/RSV plus assay is intended as an aid in the diagnosis of influenza from Nasopharyngeal swab specimens and should not be used as a sole basis for treatment. Nasal washings and aspirates are unacceptable for Xpert Xpress SARS-CoV-2/FLU/RSV testing.  Fact Sheet for Patients: BloggerCourse.com  Fact Sheet for Healthcare Providers: SeriousBroker.it  This test is not yet approved or cleared by the [place] FDA and has been authorized for detection and/or diagnosis of SARS-CoV-2 by FDA under an Emergency Use Authorization (EUA). This EUA will remain in effect (meaning this test can be used) for the  duration of the COVID-19 declaration under Section 564(b)(1) of the Act, 21 U.S.C. section 360bbb-3(b)(1), unless the authorization is terminated  or revoked.     Resp Syncytial Virus by PCR NEGATIVE NEGATIVE Final    Comment: (NOTE) Fact Sheet for Patients: BloggerCourse.com  Fact Sheet for Healthcare Providers: SeriousBroker.it  This test is not yet approved or cleared by the [place] FDA and has been authorized for detection and/or diagnosis of SARS-CoV-2 by FDA under an Emergency Use Authorization (EUA). This EUA will remain in effect (meaning this test can be used) for the duration of the COVID-19 declaration under Section 564(b)(1) of the Act, 21 U.S.C. section 360bbb-3(b)(1), unless the authorization is terminated or revoked.  Performed at [facility], [address] [place], [place] [address]      Radiological Exams on Admission: DG Chest Port 1 View Result Date: 11/01/2023 CLINICAL DATA:  Cough EXAM: PORTABLE CHEST 1 VIEW COMPARISON:  10/27/2023 FINDINGS: Stable heart size. Aortic atherosclerosis. Persistent but improving patchy airspace opacities bilaterally. No appreciable pleural effusion. No pneumothorax. IMPRESSION: Persistent but improving patchy airspace opacities bilaterally. Electronically Signed   By: Duanne Guess D.O.   On: 11/01/2023 14:35    EKG: Independently reviewed.  Sinus tachycardia, multiple PACs  Assessment/Plan  Sepsis,  Multifocal pneumonia -Presenting with fever, cough, tachycardia and leukocytosis, x-ray with bibasilar opacities -Suspect either secondary infection following influenza A last week versus aspiration PNA -Start IV Unasyn -Check SLP eval -Follow-up blood cultures -Add Xopenex, flutter valve  Influenza A Continue Tamiflu to complete course  Sinus tachycardia, PACs -Worsened in the setting of above, continue Toprol> will increase dose -Monitor on  telemetry  Dementia Anxiety, insomnia -Continue home regimen of Aricept, Namenda, BuSpar and Lexapro  History of CVA -Continue aspirin  History of UTIs -Just completed a course of antibiotics for E. Coli -Check urinalysis, cognitive deficits preclude assessment of symptoms  DVT prophylaxis: lovenox Code Status: Full code-for SNF papers Family Communication: Attempted to reach Cecilie Lowers at [phone] without success Disposition Plan:back to SNF when improved Consults called: none Admission status: inpatient  Zannie Cove MD [facility]   11/01/2023, 2:58 PM

## 2023-11-01 NOTE — Sepsis Progress Note (Signed)
Elink monitoring for the code sepsis protocol.

## 2023-11-01 NOTE — Progress Notes (Signed)
ABG sample collected and sent to Lab. Lab called and notified.

## 2023-11-01 NOTE — ED Notes (Signed)
Placed pt on 2L 02 per [place].

## 2023-11-01 NOTE — ED Notes (Signed)
41m informed pt to be transported to the floor

## 2023-11-01 NOTE — ED Provider Notes (Signed)
[place] EMERGENCY DEPARTMENT AT [facility] Provider Note  CSN: [id number] Arrival date & time: 11/01/23 1147  Chief Complaint(s) Chest Pain  HPI Christine Christensen is a 78 y.o. female history of dementia, prior stroke, hypertension CHF presenting from nursing facility for cough.  Patient not really able to provide any history, does not know why she is here in the hospital, but is coughing.  She denies any pain anywhere.  She does report sensation of feeling short of breath.  EMS picked patient up from her facility, found that she did have a fever, tachycardia, no hypotension.  She was borderline hypoxic on room air and placed on nonrebreather.  Of note, patient was recently diagnosed with influenza  History limited due to dementia  Past Medical History Past Medical History:  Diagnosis Date   Anemia    history of anemia   Aortic stenosis    mild aortic stenosis 07/27/17 echo   Arthritis    Heart murmur    Hypertension    Mitral stenosis    moderate mitral stenosis 07/27/17 echo   Stroke (HCC)    stroke x2, 2005 and 2008, some right sided weakness   Syncope and collapse 10/25/2017   Urinary incontinence    Patient Active Problem List   Diagnosis Date Noted   Pneumonia 11/01/2023   Aphasia 10/12/2023   UTI (urinary tract infection) 10/12/2023   Sepsis due to COVID-19 (HCC) 11/01/2021   Lactic acidosis 11/01/2021   Acute on chronic diastolic CHF (congestive heart failure) (HCC) 11/01/2021   Essential hypertension 11/01/2021   Vascular dementia (HCC) 11/01/2021   Delirium due to another medical condition 06/22/2021   Dementia with behavioral disturbance    Pressure injury of skin 06/17/2021   Protein-calorie malnutrition, severe 06/13/2021   Closed right hip fracture, initial encounter (HCC) 06/12/2021   Hip fracture (HCC) 06/12/2021   Fall at home, initial encounter    Syncope and collapse 10/25/2017   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 Authorizing Provider  acetaminophen (TYLENOL) 500 MG tablet Take 2 tablets (1,000 mg total) by mouth every 6 (six) hours as needed for mild pain, moderate pain, fever or headache. 06/25/21  Yes Regalado, Belkys A, MD  albuterol (VENTOLIN HFA) 108 (90 Base) MCG/ACT inhaler Inhale 2 puffs into the lungs every 4 (four) hours as needed for wheezing or shortness of breath. 11/04/21  Yes Leroy Sea, MD  aspirin 325 MG tablet Take 1 tablet (325 mg total) by mouth daily. 06/25/21  Yes Regalado, Belkys A, MD  azithromycin (ZITHROMAX Z-PAK) 250 MG tablet 500 mg given in ER on day 1, Give 250 mg once a day for days 2, 3, 4, and 5. 10/28/23  Yes Cardama, Amadeo Garnet, MD  busPIRone (BUSPAR) 5 MG tablet Take 5 mg by mouth 3 (three) times daily. 09/11/23  Yes [provider]  donepezil (ARICEPT) 5 MG tablet Take 5 mg by mouth in the morning and at bedtime. 09/27/23  Yes [provider]  escitalopram (LEXAPRO) 20 MG tablet Take 20 mg by mouth every morning.   Yes [provider]  fexofenadine (ALLEGRA) 180 MG tablet Take 180 mg by mouth daily as needed for allergies or rhinitis.   Yes [provider]  LACTOBACILLUS PO Take 1 capsule by mouth in the morning and at bedtime.   Yes [provider]  melatonin 3 MG TABS tablet Take  1 tablet (3 mg total) by mouth at bedtime. 06/25/21  Yes Regalado, Belkys A, MD  memantine (NAMENDA) 10 MG tablet Take 10 mg by mouth 2 (two) times daily. 10/04/23  Yes [provider]  metoprolol tartrate (LOPRESSOR) 25 MG tablet Take 0.5 tablets (12.5 mg total) by mouth 2 (two) times daily. 06/25/21  Yes Regalado, Belkys A, MD  Multiple Vitamin (MULTIVITAMIN WITH MINERALS) TABS tablet Take 1 tablet by mouth daily. 06/25/21  Yes Regalado, Belkys A, MD  nitrofurantoin, macrocrystal-monohydrate, (MACROBID) 100 MG capsule Take 1 capsule (100 mg total) by  mouth 2 (two) times daily. 10/27/23  Yes Cardama, Amadeo Garnet, MD  oseltamivir (TAMIFLU) 75 MG capsule Take 1 capsule (75 mg total) by mouth every 12 (twelve) hours. 10/27/23  Yes Cardama, Amadeo Garnet, MD  polyethylene glycol (MIRALAX / GLYCOLAX) 17 g packet Take 17 g by mouth 2 (two) times daily. 06/25/21  Yes Regalado, Belkys A, MD  senna-docusate (SENOKOT-S) 8.6-50 MG tablet Take 1 tablet by mouth 2 (two) times daily. 06/25/21  Yes Regalado, Belkys A, MD  traZODone (DESYREL) 50 MG tablet Take 50 mg by mouth at bedtime. 07/11/23  Yes [provider]                                                                                                                                    Past Surgical History Past Surgical History:  Procedure Laterality Date   CATARACT EXTRACTION, BILATERAL Bilateral    CHOLECYSTECTOMY     HIP ARTHROPLASTY Right 06/14/2021   Procedure: ARTHROPLASTY BIPOLAR HIP (HEMIARTHROPLASTY);  Surgeon: Joen Laura, MD;  Location: [facility] OR;  Service: Orthopedics;  Laterality: Right;   TONSILLECTOMY     TOTAL KNEE ARTHROPLASTY Right    right knee 2006   TOTAL KNEE ARTHROPLASTY Left 08/06/2017   Procedure: LEFT TOTAL KNEE ARTHROPLASTY;  Surgeon: Dannielle Huh, MD;  Location: [facility] OR;  Service: Orthopedics;  Laterality: Left;   TUBAL LIGATION     Family History Family History  Problem Relation Age of Onset   Dementia Mother    Heart disease Father    Cerebral palsy Sister     Social History Social History   Tobacco Use   Smoking status: Former    Current packs/day: 0.50    Average packs/day: 0.5 packs/day for 56.0 years (28.0 ttl pk-yrs)    Types: Cigarettes   Smokeless tobacco: Never  Vaping Use   Vaping status: Never Used  Substance Use Topics   Alcohol use: Not Currently    Comment: Sometimes   Drug use: Not Currently    Types: Marijuana   Allergies Penicillins  Review of Systems Review of Systems  All other systems reviewed and are  negative.   Physical Exam Vital Signs  I have reviewed the triage vital signs BP 113/89   Pulse (!) 107   Temp 98.9 F (37.2 C) (Oral)   Resp 18  Ht 5\' 4"  (1.626 m)   Wt 68 kg   SpO2 94%   BMI 25.73 kg/m  Physical Exam Vitals and nursing note reviewed.  Constitutional:      General: She is not in acute distress.    Appearance: She is well-developed.  HENT:     Head: Normocephalic and atraumatic.     Mouth/Throat:     Mouth: Mucous membranes are moist.  Eyes:     Pupils: Pupils are equal, round, and reactive to light.  Cardiovascular:     Rate and Rhythm: Normal rate and regular rhythm.     Heart sounds: No murmur heard. Pulmonary:     Effort: Pulmonary effort is normal. No respiratory distress.     Breath sounds: Examination of the right-lower field reveals rhonchi. Examination of the left-lower field reveals rhonchi. Rhonchi present.     Comments: Frequent cough, no respiratory distress Abdominal:     General: Abdomen is flat.     Palpations: Abdomen is soft.     Tenderness: There is no abdominal tenderness.  Musculoskeletal:        General: No tenderness.     Right lower leg: No edema.     Left lower leg: No edema.  Skin:    General: Skin is warm and dry.  Neurological:     General: No focal deficit present.     Mental Status: She is alert. Mental status is at baseline.     Comments: Oriented to self, place, not to situation or time  Psychiatric:        Mood and Affect: Mood normal.        Behavior: Behavior normal.     ED Results and Treatments Labs (all labs ordered are listed, but only abnormal results are displayed) Labs Reviewed  COMPREHENSIVE METABOLIC PANEL - Abnormal; Notable for the following components:      Result Value   Glucose, Bld 129 (*)    Calcium 8.7 (*)    Albumin 2.7 (*)    All other components within normal limits  CBC WITH DIFFERENTIAL/PLATELET - Abnormal; Notable for the following components:   WBC 17.9 (*)    RBC 3.86 (*)     Hemoglobin 11.3 (*)    HCT 33.5 (*)    Neutro Abs 15.5 (*)    Monocytes Absolute 1.2 (*)    Abs Immature Granulocytes 0.14 (*)    All other components within normal limits  I-STAT VENOUS BLOOD GAS, ED - Abnormal; Notable for the following components:   pH, Ven 7.446 (*)    pCO2, Ven 39.3 (*)    Acid-Base Excess 3.0 (*)    Calcium, Ion 1.11 (*)    HCT 34.0 (*)    Hemoglobin 11.6 (*)    All other components within normal limits  I-STAT CG4 LACTIC ACID, ED - Abnormal; Notable for the following components:   Lactic Acid, Venous 2.0 (*)    All other components within normal limits  CULTURE, BLOOD (ROUTINE X 2)  CULTURE, BLOOD (ROUTINE X 2)  EXPECTORATED SPUTUM ASSESSMENT W GRAM STAIN, RFLX TO RESP C  CBC  CREATININE, SERUM  URINALYSIS, ROUTINE W REFLEX MICROSCOPIC  I-STAT CG4 LACTIC ACID, ED  TROPONIN I (HIGH SENSITIVITY)  TROPONIN I (HIGH SENSITIVITY)  Radiology DG Chest Port 1 View Result Date: 11/01/2023 CLINICAL DATA:  Cough EXAM: PORTABLE CHEST 1 VIEW COMPARISON:  10/27/2023 FINDINGS: Stable heart size. Aortic atherosclerosis. Persistent but improving patchy airspace opacities bilaterally. No appreciable pleural effusion. No pneumothorax. IMPRESSION: Persistent but improving patchy airspace opacities bilaterally. Electronically Signed   By: Duanne Guess D.O.   On: 11/01/2023 14:35    Pertinent labs & imaging results that were available during my care of the patient were reviewed by me and considered in my medical decision making (see MDM for details).  Medications Ordered in ED Medications  aspirin tablet 325 mg (has no administration in time range)  oseltamivir (TAMIFLU) capsule 75 mg (has no administration in time range)  metoprolol tartrate (LOPRESSOR) tablet 25 mg (has no administration in time range)  busPIRone (BUSPAR) tablet 5 mg (has no  administration in time range)  donepezil (ARICEPT) tablet 5 mg (has no administration in time range)  escitalopram (LEXAPRO) tablet 20 mg (has no administration in time range)  memantine (NAMENDA) tablet 10 mg (has no administration in time range)  traZODone (DESYREL) tablet 50 mg (has no administration in time range)  polyethylene glycol (MIRALAX / GLYCOLAX) packet 17 g (has no administration in time range)  senna-docusate (Senokot-S) tablet 1 tablet (has no administration in time range)  melatonin tablet 3 mg (has no administration in time range)  multivitamin with minerals tablet 1 tablet (has no administration in time range)  enoxaparin (LOVENOX) injection 40 mg (has no administration in time range)  ondansetron (ZOFRAN) tablet 4 mg (has no administration in time range)    Or  ondansetron (ZOFRAN) injection 4 mg (has no administration in time range)  acetaminophen (TYLENOL) tablet 650 mg (has no administration in time range)    Or  acetaminophen (TYLENOL) suppository 650 mg (has no administration in time range)  levalbuterol (XOPENEX) nebulizer solution 0.63 mg (has no administration in time range)  ipratropium-albuterol (DUONEB) 0.5-2.5 (3) MG/3ML nebulizer solution 3 mL (3 mLs Nebulization Given 11/01/23 1244)  sodium chloride 0.9 % bolus 1,000 mL (0 mLs Intravenous Stopped 11/01/23 1354)  cefTRIAXone (ROCEPHIN) 2 g in sodium chloride 0.9 % 100 mL IVPB (0 g Intravenous Stopped 11/01/23 1424)  azithromycin (ZITHROMAX) 500 mg in sodium chloride 0.9 % 250 mL IVPB (0 mg Intravenous Stopped 11/01/23 1457)  lactated ringers bolus 1,000 mL (1,000 mLs Intravenous New Bag/Given 11/01/23 1356)                                                                                                                                     Procedures Procedures  (including critical care time)  Medical Decision Making / ED Course   MDM:  78 year old presenting to the emergency department with cough, shortness  of breath.  Patient recently diagnosed with influenza, apparently had been having worsening cough and dyspnea at nursing facility.  Not hypoxic in the emergency department, does have some abnormal  breath sounds.  Suspect underlying pneumonia, my interpretation of the x-ray shows increased haziness or opacification of the left lower lobe.  Pending radiology interpretation.  Patient with leukocytosis which is new since diagnosis of the flu here recently.  Will obtain blood cultures.  Given tachycardia, fever, also give IV fluids.  Patient endorsed some chest pain with cough, troponin negative, will obtain delta troponin.  Anticipate patient will need to be admitted.      Additional history obtained: -Additional history obtained from ems -External records from outside source obtained and reviewed including: Chart review including previous notes, labs, imaging, consultation notes including recent ER visit    Lab Tests: -I ordered, reviewed, and interpreted labs.   The pertinent results include:   Labs Reviewed  COMPREHENSIVE METABOLIC PANEL - Abnormal; Notable for the following components:      Result Value   Glucose, Bld 129 (*)    Calcium 8.7 (*)    Albumin 2.7 (*)    All other components within normal limits  CBC WITH DIFFERENTIAL/PLATELET - Abnormal; Notable for the following components:   WBC 17.9 (*)    RBC 3.86 (*)    Hemoglobin 11.3 (*)    HCT 33.5 (*)    Neutro Abs 15.5 (*)    Monocytes Absolute 1.2 (*)    Abs Immature Granulocytes 0.14 (*)    All other components within normal limits  I-STAT VENOUS BLOOD GAS, ED - Abnormal; Notable for the following components:   pH, Ven 7.446 (*)    pCO2, Ven 39.3 (*)    Acid-Base Excess 3.0 (*)    Calcium, Ion 1.11 (*)    HCT 34.0 (*)    Hemoglobin 11.6 (*)    All other components within normal limits  I-STAT CG4 LACTIC ACID, ED - Abnormal; Notable for the following components:   Lactic Acid, Venous 2.0 (*)    All other components  within normal limits  CULTURE, BLOOD (ROUTINE X 2)  CULTURE, BLOOD (ROUTINE X 2)  EXPECTORATED SPUTUM ASSESSMENT W GRAM STAIN, RFLX TO RESP C  CBC  CREATININE, SERUM  URINALYSIS, ROUTINE W REFLEX MICROSCOPIC  I-STAT CG4 LACTIC ACID, ED  TROPONIN I (HIGH SENSITIVITY)  TROPONIN I (HIGH SENSITIVITY)    Notable for lactic acidosis  EKG   EKG Interpretation Date/Time:  Thursday November 01 2023 11:59:57 EST Ventricular Rate:  116 PR Interval:  135 QRS Duration:  78 QT Interval:  350 QTC Calculation: 511 R Axis:   -34  Text Interpretation: Sinus tachycardia Multiple premature complexes, vent & supraven Anterior infarct, old Prolonged QT interval Confirmed by Alvino Blood ([id number]) on 11/01/2023 12:14:07 PM         Imaging Studies ordered: I ordered imaging studies including CXR On my interpretation imaging demonstrates pneumonia I independently visualized and interpreted imaging. I agree with the radiologist interpretation   Medicines ordered and prescription drug management: Meds ordered this encounter  Medications   ipratropium-albuterol (DUONEB) 0.5-2.5 (3) MG/3ML nebulizer solution 3 mL   sodium chloride 0.9 % bolus 1,000 mL   cefTRIAXone (ROCEPHIN) 2 g in sodium chloride 0.9 % 100 mL IVPB    Antibiotic Indication::   CAP   azithromycin (ZITHROMAX) 500 mg in sodium chloride 0.9 % 250 mL IVPB   DISCONTD: lactated ringers infusion   lactated ringers bolus 1,000 mL   DISCONTD: acetaminophen (TYLENOL) tablet 1,000 mg   aspirin tablet 325 mg   oseltamivir (TAMIFLU) capsule 75 mg   metoprolol tartrate (LOPRESSOR) tablet 25 mg  busPIRone (BUSPAR) tablet 5 mg   donepezil (ARICEPT) tablet 5 mg   escitalopram (LEXAPRO) tablet 20 mg   memantine (NAMENDA) tablet 10 mg   traZODone (DESYREL) tablet 50 mg   polyethylene glycol (MIRALAX / GLYCOLAX) packet 17 g   senna-docusate (Senokot-S) tablet 1 tablet   melatonin tablet 3 mg   multivitamin with minerals tablet 1 tablet    enoxaparin (LOVENOX) injection 40 mg   OR Linked Order Group    ondansetron (ZOFRAN) tablet 4 mg    ondansetron (ZOFRAN) injection 4 mg   OR Linked Order Group    acetaminophen (TYLENOL) tablet 650 mg    acetaminophen (TYLENOL) suppository 650 mg   levalbuterol (XOPENEX) nebulizer solution 0.63 mg    -I have reviewed the patients home medicines and have made adjustments as needed   Consultations Obtained: I requested consultation with the hospitalist,  and discussed lab and imaging findings as well as pertinent plan - they recommend: admission   Cardiac Monitoring: The patient was maintained on a cardiac monitor.  I personally viewed and interpreted the cardiac monitored which showed an underlying rhythm of: NSR   Reevaluation: After the interventions noted above, I reevaluated the patient and found that their symptoms have improved  Co morbidities that complicate the patient evaluation  Past Medical History:  Diagnosis Date   Anemia    history of anemia   Aortic stenosis    mild aortic stenosis 07/27/17 echo   Arthritis    Heart murmur    Hypertension    Mitral stenosis    moderate mitral stenosis 07/27/17 echo   Stroke [facility])    stroke x2, 2005 and 2008, some right sided weakness   Syncope and collapse 10/25/2017   Urinary incontinence       Dispostion: Disposition decision including need for hospitalization was considered, and patient admitted to the hospital.    Final Clinical Impression(s) / ED Diagnoses Final diagnoses:  Influenza  Community acquired pneumonia of left lower lobe of lung     This chart was dictated using voice recognition software.  Despite best efforts to proofread,  errors can occur which can change the documentation meaning.    Lonell Grandchild, MD 11/01/23 [phone]

## 2023-11-01 NOTE — Plan of Care (Signed)

## 2023-11-02 DIAGNOSIS — J189 Pneumonia, unspecified organism: Secondary | ICD-10-CM | POA: Diagnosis not present

## 2023-11-02 LAB — CBC
HCT: 27.7 % — ABNORMAL LOW (ref 36.0–46.0)
Hemoglobin: 9 g/dL — ABNORMAL LOW (ref 12.0–15.0)
MCH: 28.7 pg (ref 26.0–34.0)
MCHC: 32.5 g/dL (ref 30.0–36.0)
MCV: 88.2 fL (ref 80.0–100.0)
Platelets: 216 10*3/uL (ref 150–400)
RBC: 3.14 MIL/uL — ABNORMAL LOW (ref 3.87–5.11)
RDW: 15.5 % (ref 11.5–15.5)
WBC: 11 10*3/uL — ABNORMAL HIGH (ref 4.0–10.5)
nRBC: 0 % (ref 0.0–0.2)

## 2023-11-02 LAB — COMPREHENSIVE METABOLIC PANEL
ALT: 12 U/L (ref 0–44)
AST: 15 U/L (ref 15–41)
Albumin: 2 g/dL — ABNORMAL LOW (ref 3.5–5.0)
Alkaline Phosphatase: 58 U/L (ref 38–126)
Anion gap: 9 (ref 5–15)
BUN: 11 mg/dL (ref 8–23)
CO2: 25 mmol/L (ref 22–32)
Calcium: 7.6 mg/dL — ABNORMAL LOW (ref 8.9–10.3)
Chloride: 104 mmol/L (ref 98–111)
Creatinine, Ser: 0.85 mg/dL (ref 0.44–1.00)
GFR, Estimated: >60 mL/min (ref >60)
Glucose, Bld: 92 mg/dL (ref 70–99)
Potassium: 3.5 mmol/L (ref 3.5–5.1)
Sodium: 138 mmol/L (ref 135–145)
Total Bilirubin: 0.7 mg/dL (ref 0.0–1.2)
Total Protein: 5.1 g/dL — ABNORMAL LOW (ref 6.5–8.1)

## 2023-11-02 LAB — MRSA NEXT GEN BY PCR, NASAL: MRSA by PCR Next Gen: NOT DETECTED

## 2023-11-02 MED ORDER — IPRATROPIUM-ALBUTEROL 0.5-2.5 (3) MG/3ML IN SOLN
3.0000 mL | Freq: Two times a day (BID) | RESPIRATORY_TRACT | Status: DC
  Administered 2023-11-02 – 2023-11-03 (×2): 3 mL via RESPIRATORY_TRACT
  Filled 2023-11-02 (×2): qty 3

## 2023-11-02 MED ORDER — IPRATROPIUM-ALBUTEROL 0.5-2.5 (3) MG/3ML IN SOLN: 3.0000 mL | Freq: Four times a day (QID) | RESPIRATORY_TRACT | Status: DC

## 2023-11-02 MED ORDER — ALBUTEROL SULFATE (2.5 MG/3ML) 0.083% IN NEBU
2.5000 mg | INHALATION_SOLUTION | Freq: Four times a day (QID) | RESPIRATORY_TRACT | Status: DC | PRN
  Administered 2023-11-03 (×3): 2.5 mg via RESPIRATORY_TRACT
  Filled 2023-11-02 (×2): qty 3

## 2023-11-02 NOTE — Evaluation (Signed)
Clinical/Bedside Swallow Evaluation Patient Details  Name: Christine Christensen MRN: [id number] Date of Birth: 1946-01-11  Today's Date: 11/02/2023 Time: SLP Start Time (ACUTE ONLY): 2956 SLP Stop Time (ACUTE ONLY): 0849 SLP Time Calculation (min) (ACUTE ONLY): 23 min  Past Medical History:  Past Medical History:  Diagnosis Date   Anemia    history of anemia   Aortic stenosis    mild aortic stenosis 07/27/17 echo   Arthritis    Heart murmur    Hypertension    Mitral stenosis    moderate mitral stenosis 07/27/17 echo   Stroke [facility])    stroke x2, 2005 and 2008, some right sided weakness   Syncope and collapse 10/25/2017   Urinary incontinence    Past Surgical History:  Past Surgical History:  Procedure Laterality Date   CATARACT EXTRACTION, BILATERAL Bilateral    CHOLECYSTECTOMY     HIP ARTHROPLASTY Right 06/14/2021   Procedure: ARTHROPLASTY BIPOLAR HIP (HEMIARTHROPLASTY);  Surgeon: Joen Laura, MD;  Location: [facility] OR;  Service: Orthopedics;  Laterality: Right;   TONSILLECTOMY     TOTAL KNEE ARTHROPLASTY Right    right knee 2006   TOTAL KNEE ARTHROPLASTY Left 08/06/2017   Procedure: LEFT TOTAL KNEE ARTHROPLASTY;  Surgeon: Dannielle Huh, MD;  Location: [facility] OR;  Service: Orthopedics;  Laterality: Left;   TUBAL LIGATION     HPI:  Christine Christensen is a 77/F with vascular dementia, CVA (2005, 2008), chronic right lower extremity weakness, hypertension was recently hospitalized at [facility] for aphasia, cognitive deficits, eventually diagnosed with E. coli UTI and discharged back to SNF on 1/26, after this she was back in the ER 2/7 with cough and fever, diagnosed with influenza A, discharged back to her SNF. Now diagnosed with mltifocal pna. Suspect either secondary infection following influenza A last week versus aspiration PNA.    Assessment / Plan / Recommendation  Clinical Impression  Pt noted to have baseline cough prior to po's. She is alert, conversant, oromotor abilities adequate and  missing right lower posterior dentition but states she eats regular diet. She denies dysphagia with prior stroke or currently. Orally pt able to manipulate, masticate regular texture and transit without residue. She consumed multiple sips thin water via straw with one delayed cough noted at end of study. Her cough did not appear related to po's. Aspiration risk appears low and her strokes were in 2005 and 2008. Suspect cough may be due to pna related to recent flu. Recommend she continue regular texture, thin liquids, pills with thin (if difficulty can give whole in applesauce). No further ST needed at this time. SLP Visit Diagnosis: Dysphagia, unspecified (R13.10)    Aspiration Risk  Mild aspiration risk    Diet Recommendation Regular;Thin liquid    Liquid Administration via: Straw;Cup Medication Administration: Whole meds with liquid Supervision: Patient able to self feed Compensations: Slow rate;Small sips/bites Postural Changes: Seated upright at 90 degrees    Other  Recommendations Oral Care Recommendations: Oral care BID    Recommendations for follow up therapy are one component of a multi-disciplinary discharge planning process, led by the attending physician.  Recommendations may be updated based on patient status, additional functional criteria and insurance authorization.  Follow up Recommendations No SLP follow up      Assistance Recommended at Discharge    Functional Status Assessment Patient has not had a recent decline in their functional status  Frequency and Duration            Prognosis  Swallow Study   General Date of Onset: 11/02/23 HPI: Christine Christensen is a 77/F with vascular dementia, CVA (2005, 2008), chronic right lower extremity weakness, hypertension was recently hospitalized at [facility] for aphasia, cognitive deficits, eventually diagnosed with E. coli UTI and discharged back to SNF on 1/26, after this she was back in the ER 2/7 with cough and fever,  diagnosed with influenza A, discharged back to her SNF. Now diagnosed with mltifocal pna. Suspect either secondary infection following influenza A last week versus aspiration PNA. Type of Study: Bedside Swallow Evaluation Previous Swallow Assessment:  (none) Diet Prior to this Study: Regular;Thin liquids (Level 0) Temperature Spikes Noted: No Respiratory Status: Nasal cannula History of Recent Intubation: No Behavior/Cognition: Cooperative;Alert;Pleasant mood Oral Cavity Assessment: Within Functional Limits Oral Care Completed by SLP: No Oral Cavity - Dentition: Missing dentition (missing lower right posterior) Vision: Functional for self-feeding Self-Feeding Abilities: Able to feed self Patient Positioning: Upright in bed Baseline Vocal Quality: Normal Volitional Cough: Strong Volitional Swallow: Able to elicit    Oral/Motor/Sensory Function Overall Oral Motor/Sensory Function: Within functional limits   Ice Chips Ice chips: Not tested   Thin Liquid Thin Liquid: Impaired Presentation: Straw Oral Phase Functional Implications:  (none) Pharyngeal  Phase Impairments: Cough - Delayed    Nectar Thick Nectar Thick Liquid: Not tested   Honey Thick Honey Thick Liquid: Not tested   Puree Puree: Within functional limits   Solid     Solid: Within functional limits      Royce Macadamia 11/02/2023,9:06 AM

## 2023-11-02 NOTE — TOC Progression Note (Signed)
Transition of Care [facility]) - Progression Note    Patient Details  Name: Christine Christensen MRN: [id number] Date of Birth: [birth year]  Transition [facility] CM/SW Contact  Christine Christensen, [place] Phone Number: 11/02/2023, 3:41 PM  Clinical Narrative:     CSW notified by RN that pt's son is requesting a call from CSW. CSW called son and had a discussion about pt's circumstances at length. Son expressed concerns that pt is not receiving adequate care at her LTC SNF. He also explains that he is not able to take care of pt at home due to being disabled himself as well as home not being in adequate shape for pt to be there. Pt begins discussing interest in getting guardianship of pt. Upon CSW seeking clarification of what son wants help with exactly, son reveals that pt has a guardian with the state. CSW then explain that all decisions on pt's care would need to go through pts legal guardian. Son explains that pt has a guardian through [facility] [facility] and a DSS social worker named Mrs. Lequita Halt. He is unable to provide contact info for these individuals. CSW ends phone call at this time.   CSW called [facility] to inquire about guardianship status. Pt is a LTC resident at [facility]. [facility] liaison provides CSW with guardian info that they have: Christine Christensen with DSS [phone]. This phone number did not work when CSW called.   CSW called [place] DSS Guardianship [phone], and requested return call.   Expected Discharge Plan: Skilled Nursing Facility Barriers to Discharge: Continued Medical Work up                      Social Determinants of Health (SDOH) Interventions SDOH Screenings   Food Insecurity: No Food Insecurity (11/01/2023)  Housing: Low Risk  (11/01/2023)  Transportation Needs: No Transportation Needs (11/01/2023)  Utilities: Not At Risk (11/01/2023)  Social Connections: Patient Unable To Answer (11/01/2023)  Tobacco Use: Medium Risk (11/01/2023)     Readmission Risk Interventions     No data to display

## 2023-11-02 NOTE — Progress Notes (Signed)
PROGRESS NOTE    Christine Christensen  ZOX:[id number] DOB: 09-07-1946 DOA: 11/01/2023 PCP: Fleet Contras, MD  Volume overload to 77/F with vascular dementia, CVA, chronic right lower extremity weakness, hypertension was recently hospitalized at [facility] for aphasia, cognitive deficits, eventually diagnosed with E. coli UTI and discharged back to SNF on 1/26, ..back in the ER 2/7 with cough and fever, diagnosed with influenza A, discharged back.  -Back in the ER 2/13  with cough, fever, shortness of breath. In ED Tachycardic, mildly hypoxic, labs noted WBC of 17.9, creatinine 0.8, troponin 7, lactic acid 2.0, CXR with persistent bibasilar opacities  Subjective: -Feels okay overall, no events overnight  Assessment and Plan:  Sepsis,  Multifocal pneumonia -Presenting with fever, cough, tachycardia and leukocytosis, x-ray with bibasilar opacities -Suspected to be secondary infection following influenza A versus aspiration -continue IV ceftriaxone and Flagyl  -Xopenex, flutter valve -Blood cultures negative thus far,  -SLP eval today  -Increase activity, PT OT   Influenza A Continue Tamiflu to complete course likely tomorrow  Severe hypoalbuminemia -Protein calorie malnutrition -Dietitian consult   Sinus tachycardia, PACs -Worsened in the setting of above, continue Toprol> dose increased, improving -Monitor on telemetry   Dementia Anxiety, insomnia -Continue home regimen of Aricept, Namenda, BuSpar and Lexapro   History of CVA -Continue aspirin   History of UTIs -Just completed a course of antibiotics for E. Coli -UA unremarkable     DVT prophylaxis: lovenox Code Status: Code  family Communication: Family at bedside, will attempt to reach son Disposition Plan: Back to SNF in 1 to 2 days  Consultants:    Procedures:   Antimicrobials:    Objective: Vitals:   11/02/23 0200 11/02/23 0400 11/02/23 0810 11/02/23 0818  BP: 125/70 102/66 94/81   Pulse: 77 71 82   Resp:    18   Temp: 98.6 F (37 C) 98.7 F (37.1 C) 98.4 F (36.9 C)   TempSrc: Oral Oral Oral   SpO2: 98% 96% 97% 96%  Weight:      Height:        Intake/Output Summary (Last 24 hours) at 11/02/2023 1132 Last data filed at 11/02/2023 0600 Gross per 24 hour  Intake 1840 ml  Output 0 ml  Net 1840 ml   Filed Weights   11/01/23 1217  Weight: 68 kg    Examination:  General exam: Appears calm and comfortable, AAOx2 Respiratory system: Few rhonchi at the bases Cardiovascular system: S1 & S2 heard, RRR.  Abd: nondistended, soft and nontender.Normal bowel sounds heard. Central nervous system: Awake alert, chronic right leg weakness 6 Extremities: no edema Skin: No rashes Psychiatry:  Mood & affect appropriate.     Data Reviewed:   CBC: Recent Labs  Lab 10/27/23 0034 11/01/23 1235 11/01/23 1243 11/01/23 1547 11/02/23 0722  WBC 9.2 17.9*  --  17.8* 11.0*  NEUTROABS 7.7 15.5*  --   --   --   HGB 12.7 11.3* 11.6* 11.2* 9.0*  HCT 38.9 33.5* 34.0* 34.2* 27.7*  MCV 88.6 86.8  --  87.7 88.2  PLT 233 219  --  228 216   Basic Metabolic Panel: Recent Labs  Lab 10/27/23 0034 11/01/23 1235 11/01/23 1243 11/01/23 1547 11/02/23 0722  NA 141 138 140  --  138  K 3.8 3.6 3.6  --  3.5  CL 104 103  --   --  104  CO2 26 26  --   --  25  GLUCOSE 108* 129*  --   --  92  BUN 10 11  --   --  11  CREATININE 0.79 0.83  --  0.81 0.85  CALCIUM 9.5 8.7*  --   --  7.6*   GFR: Estimated Creatinine Clearance: 51.7 mL/min (by C-G formula based on SCr of 0.85 mg/dL). Liver Function Tests: Recent Labs  Lab 10/27/23 0034 11/01/23 1235 11/02/23 0722  AST 19 21 15   ALT 16 16 12   ALKPHOS 63 74 58  BILITOT 0.5 1.0 0.7  PROT 7.3 6.8 5.1*  ALBUMIN 3.6 2.7* 2.0*   No results for input(s): "LIPASE", "AMYLASE" in the last 168 hours. No results for input(s): "AMMONIA" in the last 168 hours. Coagulation Profile: Recent Labs  Lab 10/27/23 0034  INR 1.0   Cardiac Enzymes: No results for  input(s): "CKTOTAL", "CKMB", "CKMBINDEX", "TROPONINI" in the last 168 hours. BNP (last 3 results) No results for input(s): "PROBNP" in the last 8760 hours. HbA1C: No results for input(s): "HGBA1C" in the last 72 hours. CBG: No results for input(s): "GLUCAP" in the last 168 hours. Lipid Profile: No results for input(s): "CHOL", "HDL", "LDLCALC", "TRIG", "CHOLHDL", "LDLDIRECT" in the last 72 hours. Thyroid Function Tests: No results for input(s): "TSH", "T4TOTAL", "FREET4", "T3FREE", "THYROIDAB" in the last 72 hours. Anemia Panel: No results for input(s): "VITAMINB12", "FOLATE", "FERRITIN", "TIBC", "IRON", "RETICCTPCT" in the last 72 hours. Urine analysis:    Component Value Date/Time   COLORURINE AMBER (A) 11/01/2023 2243   APPEARANCEUR CLEAR 11/01/2023 2243   LABSPEC 1.017 11/01/2023 2243   PHURINE 5.0 11/01/2023 2243   GLUCOSEU NEGATIVE 11/01/2023 2243   HGBUR NEGATIVE 11/01/2023 2243   BILIRUBINUR NEGATIVE 11/01/2023 2243   KETONESUR NEGATIVE 11/01/2023 2243   PROTEINUR NEGATIVE 11/01/2023 2243   NITRITE NEGATIVE 11/01/2023 2243   LEUKOCYTESUR NEGATIVE 11/01/2023 2243   Sepsis Labs: @LABRCNTIP (procalcitonin:4,lacticidven:4)  ) Recent Results (from the past 240 hours)  Blood Culture (routine x 2)     Status: None   Collection Time: 10/27/23 12:34 AM   Specimen: BLOOD RIGHT HAND  Result Value Ref Range Status   Specimen Description BLOOD RIGHT HAND  Final   Special Requests   Final    BOTTLES DRAWN AEROBIC AND ANAEROBIC Blood Culture adequate volume   Culture   Final    NO GROWTH 5 DAYS Performed at [facility], [address] [place], [place] [address]    Report Status 11/01/2023 FINAL  Final  Blood Culture (routine x 2)     Status: None   Collection Time: 10/27/23 12:34 AM   Specimen: BLOOD RIGHT FOREARM  Result Value Ref Range Status   Specimen Description BLOOD RIGHT FOREARM  Final   Special Requests   Final    BOTTLES DRAWN AEROBIC AND ANAEROBIC Blood  Culture adequate volume   Culture   Final    NO GROWTH 5 DAYS Performed at [facility], [address] [place], [place] [address]    Report Status 11/01/2023 FINAL  Final  Resp panel by RT-PCR (RSV, Flu A&B, Covid) Anterior Nasal Swab     Status: Abnormal   Collection Time: 10/27/23  1:02 AM   Specimen: Anterior Nasal Swab  Result Value Ref Range Status   SARS Coronavirus 2 by RT PCR NEGATIVE NEGATIVE Final   Influenza A by PCR POSITIVE (A) NEGATIVE Final   Influenza B by PCR NEGATIVE NEGATIVE Final    Comment: (NOTE) The Xpert Xpress SARS-CoV-2/FLU/RSV plus assay is intended as an aid in the diagnosis of influenza from Nasopharyngeal  swab specimens and should not be used as a sole basis for treatment. Nasal washings and aspirates are unacceptable for Xpert Xpress SARS-CoV-2/FLU/RSV testing.  Fact Sheet for Patients: BloggerCourse.com  Fact Sheet for Healthcare Providers: SeriousBroker.it  This test is not yet approved or cleared by the [place] FDA and has been authorized for detection and/or diagnosis of SARS-CoV-2 by FDA under an Emergency Use Authorization (EUA). This EUA will remain in effect (meaning this test can be used) for the duration of the COVID-19 declaration under Section 564(b)(1) of the Act, 21 U.S.C. section 360bbb-3(b)(1), unless the authorization is terminated or revoked.     Resp Syncytial Virus by PCR NEGATIVE NEGATIVE Final    Comment: (NOTE) Fact Sheet for Patients: BloggerCourse.com  Fact Sheet for Healthcare Providers: SeriousBroker.it  This test is not yet approved or cleared by the [place] FDA and has been authorized for detection and/or diagnosis of SARS-CoV-2 by FDA under an Emergency Use Authorization (EUA). This EUA will remain in effect (meaning this test can be used) for the duration of the COVID-19 declaration under  Section 564(b)(1) of the Act, 21 U.S.C. section 360bbb-3(b)(1), unless the authorization is terminated or revoked.  Performed at [facility], [address], [place], [place] [address]   Culture, blood (routine x 2)     Status: None (Preliminary result)   Collection Time: 11/01/23 12:13 PM   Specimen: BLOOD  Result Value Ref Range Status   Specimen Description BLOOD RIGHT ANTECUBITAL  Final   Special Requests   Final    BOTTLES DRAWN AEROBIC AND ANAEROBIC Blood Culture adequate volume   Culture   Final    NO GROWTH < 24 HOURS Performed at [facility], [address], [place], [place] [address]    Report Status PENDING  Incomplete  Culture, blood (routine x 2)     Status: None (Preliminary result)   Collection Time: 11/01/23 12:18 PM   Specimen: BLOOD LEFT FOREARM  Result Value Ref Range Status   Specimen Description BLOOD LEFT FOREARM  Final   Special Requests   Final    BOTTLES DRAWN AEROBIC AND ANAEROBIC Blood Culture results may not be optimal due to an inadequate volume of blood received in culture bottles   Culture   Final    NO GROWTH < 24 HOURS Performed at [facility], [address] [place], [place] [address]    Report Status PENDING  Incomplete  MRSA Next Gen by PCR, Nasal     Status: None   Collection Time: 11/02/23  1:41 AM   Specimen: Nasal Mucosa; Nasal Swab  Result Value Ref Range Status   MRSA by PCR Next Gen NOT DETECTED NOT DETECTED Final    Comment: (NOTE) The GeneXpert MRSA Assay (FDA approved for NASAL specimens only), is one component of a comprehensive MRSA colonization surveillance program. It is not intended to diagnose MRSA infection nor to guide or monitor treatment for MRSA infections. Test performance is not FDA approved in patients less than 4 years old. Performed at [facility], [address], [place], [place] [address]      Radiology Studies: DG Chest Port 1 View Result Date: 11/01/2023 CLINICAL DATA:   Cough EXAM: PORTABLE CHEST 1 VIEW COMPARISON:  10/27/2023 FINDINGS: Stable heart size. Aortic atherosclerosis. Persistent but improving patchy airspace opacities bilaterally. No appreciable pleural effusion. No pneumothorax. IMPRESSION: Persistent but improving patchy airspace opacities bilaterally. Electronically Signed   By: Duanne Guess D.O.  On: 11/01/2023 14:35     Scheduled Meds:  aspirin  325 mg Oral Daily   busPIRone  5 mg Oral TID   donepezil  5 mg Oral QHS   enoxaparin (LOVENOX) injection  40 mg Subcutaneous Q24H   escitalopram  20 mg Oral q morning   ipratropium-albuterol  3 mL Nebulization Q6H   melatonin  3 mg Oral QHS   memantine  10 mg Oral BID   metoprolol tartrate  25 mg Oral BID   multivitamin with minerals  1 tablet Oral Daily   oseltamivir  30 mg Oral Q12H   senna-docusate  1 tablet Oral BID   traZODone  50 mg Oral QHS   Continuous Infusions:  cefTRIAXone (ROCEPHIN)  IV     metronidazole 500 mg (11/02/23 0545)     LOS: 1 day    Time spent:    Zannie Cove, MD [facility]   11/02/2023, 11:32 AM

## 2023-11-03 ENCOUNTER — Inpatient Hospital Stay (HOSPITAL_COMMUNITY): Payer: Medicare Other

## 2023-11-03 DIAGNOSIS — J189 Pneumonia, unspecified organism: Secondary | ICD-10-CM | POA: Diagnosis not present

## 2023-11-03 LAB — CBC
HCT: 28.8 % — ABNORMAL LOW (ref 36.0–46.0)
Hemoglobin: 9.3 g/dL — ABNORMAL LOW (ref 12.0–15.0)
MCH: 28.4 pg (ref 26.0–34.0)
MCHC: 32.3 g/dL (ref 30.0–36.0)
MCV: 88.1 fL (ref 80.0–100.0)
Platelets: 268 10*3/uL (ref 150–400)
RBC: 3.27 MIL/uL — ABNORMAL LOW (ref 3.87–5.11)
RDW: 15.5 % (ref 11.5–15.5)
WBC: 10.4 10*3/uL (ref 4.0–10.5)
nRBC: 0 % (ref 0.0–0.2)

## 2023-11-03 LAB — BASIC METABOLIC PANEL
Anion gap: 9 (ref 5–15)
BUN: 9 mg/dL (ref 8–23)
CO2: 25 mmol/L (ref 22–32)
Calcium: 8.3 mg/dL — ABNORMAL LOW (ref 8.9–10.3)
Chloride: 106 mmol/L (ref 98–111)
Creatinine, Ser: 0.84 mg/dL (ref 0.44–1.00)
GFR, Estimated: >60 mL/min (ref >60)
Glucose, Bld: 90 mg/dL (ref 70–99)
Potassium: 3.6 mmol/L (ref 3.5–5.1)
Sodium: 140 mmol/L (ref 135–145)

## 2023-11-03 MED ORDER — CEFUROXIME AXETIL 250 MG PO TABS
250.0000 mg | ORAL_TABLET | Freq: Two times a day (BID) | ORAL | Status: DC
  Administered 2023-11-03: 250 mg via ORAL
  Filled 2023-11-03 (×2): qty 1

## 2023-11-03 MED ORDER — IPRATROPIUM-ALBUTEROL 0.5-2.5 (3) MG/3ML IN SOLN: 3.0000 mL | RESPIRATORY_TRACT | Status: DC

## 2023-11-03 NOTE — Evaluation (Signed)
Physical Therapy Evaluation Patient Details Name: Christine Christensen MRN: [id number] DOB: 1946-04-10 Today's Date: 11/03/2023  History of Present Illness  78 y.o. female presents to [facility] hospital on 11/01/2023 with cough and fever, found to have fluA. Pt recently hospitalized for UTI and discharged to SNF. PMH includes dementia, CVA with RLE weakness, HTN.  Clinical Impression  Pt presents to PT with deficits in functional mobility, strength, power, endurance. Pt has chronic R weakness from prior CVA and demonstrates R knee buckling with attempts at ambulation during this session. Pt does require physical assistance to ascend into standing. Due to pt's history of dementia her reports of PLOF will need to be confirmed with staff at [facility], however the pt may be weaker than baseline due to flu. PT will follow during admission. Patient will benefit from continued inpatient follow up therapy, <3 hours/day, if below her functional baseline.        If plan is discharge home, recommend the following: A lot of help with walking and/or transfers;A lot of help with bathing/dressing/bathroom;Assistance with cooking/housework;Assist for transportation;Help with stairs or ramp for entrance;Supervision due to cognitive status   Can travel by private vehicle   Yes    Equipment Recommendations None recommended by PT  Recommendations for Other Services       Functional Status Assessment Patient has had a recent decline in their functional status and demonstrates the ability to make significant improvements in function in a reasonable and predictable amount of time.     Precautions / Restrictions Precautions Precautions: Fall Recall of Precautions/Restrictions: Impaired Restrictions Weight Bearing Restrictions Per Provider Order: No      Mobility  Bed Mobility Overal bed mobility: Needs Assistance Bed Mobility: Supine to Sit, Sit to Supine     Supine to sit: Min assist Sit to supine: Contact  guard assist        Transfers Overall transfer level: Needs assistance Equipment used: Rolling walker (2 wheels) Transfers: Sit to/from Stand Sit to Stand: Mod assist                Ambulation/Gait Ambulation/Gait assistance: Min assist Gait Distance (Feet): 10 Feet Assistive device: Rolling walker (2 wheels) Gait Pattern/deviations: Step-to pattern, Knees buckling Gait velocity: reduced Gait velocity interpretation: <1.31 ft/sec, indicative of household ambulator   General Gait Details: pt with slowed step-to gait, reduced stance time on RLE with mild R knee buckling noted although no overt losses of balance  Stairs            Wheelchair Mobility     Tilt Bed    Modified Rankin (Stroke Patients Only)       Balance Overall balance assessment: Needs assistance Sitting-balance support: No upper extremity supported, Feet supported Sitting balance-Leahy Scale: Fair     Standing balance support: Bilateral upper extremity supported, Reliant on assistive device for balance Standing balance-Leahy Scale: Poor                               Pertinent Vitals/Pain Pain Assessment Pain Assessment: No/denies pain    Home Living Family/patient expects to be discharged to:: Skilled nursing facility                   Additional Comments: pt resides at [facility] in LTC    Prior Function Prior Level of Function : Needs assist             Mobility Comments: Pt  reports that she primarily uses a wheelchair. She states that she can walk to her wheelchair by herself without a walker however unsure how accurate this is. ADLs Comments: pt is a poor historian, reports she sometimes assists with household cleaning despite living in LTC at [facility]. This seems quite unlikely.     Extremity/Trunk Assessment   Upper Extremity Assessment Upper Extremity Assessment: RUE deficits/detail RUE Deficits / Details: grossly 4-/5    Lower Extremity  Assessment Lower Extremity Assessment: RLE deficits/detail RLE Deficits / Details: grossly 4-/5, mild knee buckling during stance phase of gait    Cervical / Trunk Assessment Cervical / Trunk Assessment: Normal  Communication   Communication Communication: No apparent difficulties    Cognition Arousal: Alert Behavior During Therapy: WFL for tasks assessed/performed   PT - Cognitive impairments: History of cognitive impairments                       PT - Cognition Comments: dementia in medical history. pt is alert and oriented to person and place. Pt with some awareness of R weakness but appears to be a poor historian in regards to her PLOF Following commands: Intact       Cueing Cueing Techniques: Verbal cues     General Comments General comments (skin integrity, edema, etc.): VSS on RA    Exercises     Assessment/Plan    PT Assessment Patient needs continued PT services  PT Problem List Decreased strength;Decreased activity tolerance;Decreased balance;Decreased mobility;Decreased knowledge of use of DME;Decreased safety awareness;Decreased knowledge of precautions;Decreased cognition       PT Treatment Interventions DME instruction;Gait training;Functional mobility training;Therapeutic activities;Therapeutic exercise;Balance training;Neuromuscular re-education;Cognitive remediation;Patient/family education;Wheelchair mobility training    PT Goals (Current goals can be found in the Care Plan section)  Acute Rehab PT Goals Patient Stated Goal: to improve endurance PT Goal Formulation: With patient Time For Goal Achievement: 11/17/23 Potential to Achieve Goals: Fair    Frequency Min 1X/week     Co-evaluation               AM-PAC PT "6 Clicks" Mobility  Outcome Measure Help needed turning from your back to your side while in a flat bed without using bedrails?: A Little Help needed moving from lying on your back to sitting on the side of a flat bed  without using bedrails?: A Little Help needed moving to and from a bed to a chair (including a wheelchair)?: A Little Help needed standing up from a chair using your arms (e.g., wheelchair or bedside chair)?: A Little Help needed to walk in hospital room?: A Lot Help needed climbing 3-5 steps with a railing? : Total 6 Click Score: 15    End of Session Equipment Utilized During Treatment: Gait belt Activity Tolerance: Patient tolerated treatment well Patient left: in bed;with call bell/phone within reach;with bed alarm set Nurse Communication: Mobility status PT Visit Diagnosis: Other abnormalities of gait and mobility (R26.89);Muscle weakness (generalized) (M62.81)    Time: 1914-7829 PT Time Calculation (min) (ACUTE ONLY): 26 min   Charges:   PT Evaluation $PT Eval Low Complexity: 1 Low   PT General Charges $$ ACUTE PT VISIT: 1 Visit         Arlyss Gandy, PT, DPT Acute Rehabilitation Office [phone]   Arlyss Gandy 11/03/2023, 5:16 PM

## 2023-11-03 NOTE — Progress Notes (Signed)
PROGRESS NOTE    Christine Christensen  ZOX:[id number] DOB: 28-Oct-1945 DOA: 11/01/2023 PCP: Fleet Contras, MD  Volume overload to 77/F with vascular dementia, CVA, chronic right lower extremity weakness, hypertension was recently hospitalized at [facility] for aphasia, cognitive deficits, eventually diagnosed with E. coli UTI and discharged back to SNF on 1/26, ..back in the ER 2/7 with cough and fever, diagnosed with influenza A, discharged back.  -Back in the ER 2/13  with cough, fever, shortness of breath. In ED Tachycardic, mildly hypoxic, labs noted WBC of 17.9, creatinine 0.8, troponin 7, lactic acid 2.0, CXR with persistent bibasilar opacities  Subjective: -Feels okay, no events overnight  Assessment and Plan:  Sepsis,  Multifocal pneumonia -Presenting with fever, cough, tachycardia and leukocytosis, x-ray with bibasilar opacities -Suspected to be secondary infection following influenza A versus aspiration -clinically improving, transition to oral cefuroxime today -Xopenex, flutter valve -Blood cultures negative thus far,  -SLP eval completed.,  No overt aspiration noted -Increase activity, PT OT   Influenza A Complete Tamiflu course today  Severe hypoalbuminemia -Protein calorie malnutrition -Dietitian consult   Sinus tachycardia, PACs -Worsened in the setting of above, continue Toprol> dose increased, improving   Dementia Anxiety, insomnia -Continue home regimen of Aricept, Namenda, BuSpar and Lexapro   History of CVA -Continue aspirin   History of UTIs -Just completed a course of antibiotics for E. Coli -UA unremarkable     DVT prophylaxis: lovenox Code Status: Code  family Communication: No family at bedside, she is a guardian of the state Disposition Plan: Back to SNF in 1 to 2 days  Consultants:    Procedures:   Antimicrobials:    Objective: Vitals:   11/02/23 2134 11/03/23 0507 11/03/23 0802 11/03/23 1004  BP: 116/73 111/60  118/85  Pulse: (!) 108 (!)  119  (!) 105  Resp: 18     Temp: 98.5 F (36.9 C) 98.2 F (36.8 C)  98.9 F (37.2 C)  TempSrc: Oral Oral  Oral  SpO2: 100% 100% 96% 90%  Weight:      Height:        Intake/Output Summary (Last 24 hours) at 11/03/2023 1155 Last data filed at 11/03/2023 1000 Gross per 24 hour  Intake 1095.44 ml  Output 351 ml  Net 744.44 ml   Filed Weights   11/01/23 1217  Weight: 68 kg    Examination:  General exam: Appears calm and comfortable, AAOx2 Respiratory system: Few rhonchi at the bases Cardiovascular system: S1 & S2 heard, RRR.  Abd: nondistended, soft and nontender.Normal bowel sounds heard. Central nervous system: Awake alert, chronic right leg weakness 6 Extremities: no edema Skin: No rashes Psychiatry:  Mood & affect appropriate.     Data Reviewed:   CBC: Recent Labs  Lab 11/01/23 1235 11/01/23 1243 11/01/23 1547 11/02/23 0722 11/03/23 0259  WBC 17.9*  --  17.8* 11.0* 10.4  NEUTROABS 15.5*  --   --   --   --   HGB 11.3* 11.6* 11.2* 9.0* 9.3*  HCT 33.5* 34.0* 34.2* 27.7* 28.8*  MCV 86.8  --  87.7 88.2 88.1  PLT 219  --  228 216 268   Basic Metabolic Panel: Recent Labs  Lab 11/01/23 1235 11/01/23 1243 11/01/23 1547 11/02/23 0722 11/03/23 0259  NA 138 140  --  138 140  K 3.6 3.6  --  3.5 3.6  CL 103  --   --  104 106  CO2 26  --   --  25 25  GLUCOSE  129*  --   --  92 90  BUN 11  --   --  11 9  CREATININE 0.83  --  0.81 0.85 0.84  CALCIUM 8.7*  --   --  7.6* 8.3*   GFR: Estimated Creatinine Clearance: 52.3 mL/min (by C-G formula based on SCr of 0.84 mg/dL). Liver Function Tests: Recent Labs  Lab 11/01/23 1235 11/02/23 0722  AST 21 15  ALT 16 12  ALKPHOS 74 58  BILITOT 1.0 0.7  PROT 6.8 5.1*  ALBUMIN 2.7* 2.0*   No results for input(s): "LIPASE", "AMYLASE" in the last 168 hours. No results for input(s): "AMMONIA" in the last 168 hours. Coagulation Profile: No results for input(s): "INR", "PROTIME" in the last 168 hours.  Cardiac  Enzymes: No results for input(s): "CKTOTAL", "CKMB", "CKMBINDEX", "TROPONINI" in the last 168 hours. BNP (last 3 results) No results for input(s): "PROBNP" in the last 8760 hours. HbA1C: No results for input(s): "HGBA1C" in the last 72 hours. CBG: No results for input(s): "GLUCAP" in the last 168 hours. Lipid Profile: No results for input(s): "CHOL", "HDL", "LDLCALC", "TRIG", "CHOLHDL", "LDLDIRECT" in the last 72 hours. Thyroid Function Tests: No results for input(s): "TSH", "T4TOTAL", "FREET4", "T3FREE", "THYROIDAB" in the last 72 hours. Anemia Panel: No results for input(s): "VITAMINB12", "FOLATE", "FERRITIN", "TIBC", "IRON", "RETICCTPCT" in the last 72 hours. Urine analysis:    Component Value Date/Time   COLORURINE AMBER (A) 11/01/2023 2243   APPEARANCEUR CLEAR 11/01/2023 2243   LABSPEC 1.017 11/01/2023 2243   PHURINE 5.0 11/01/2023 2243   GLUCOSEU NEGATIVE 11/01/2023 2243   HGBUR NEGATIVE 11/01/2023 2243   BILIRUBINUR NEGATIVE 11/01/2023 2243   KETONESUR NEGATIVE 11/01/2023 2243   PROTEINUR NEGATIVE 11/01/2023 2243   NITRITE NEGATIVE 11/01/2023 2243   LEUKOCYTESUR NEGATIVE 11/01/2023 2243   Sepsis Labs: @LABRCNTIP (procalcitonin:4,lacticidven:4)  ) Recent Results (from the past 240 hours)  Blood Culture (routine x 2)     Status: None   Collection Time: 10/27/23 12:34 AM   Specimen: BLOOD RIGHT HAND  Result Value Ref Range Status   Specimen Description BLOOD RIGHT HAND  Final   Special Requests   Final    BOTTLES DRAWN AEROBIC AND ANAEROBIC Blood Culture adequate volume   Culture   Final    NO GROWTH 5 DAYS Performed at [facility], [address] [place], [place] [address]    Report Status 11/01/2023 FINAL  Final  Blood Culture (routine x 2)     Status: None   Collection Time: 10/27/23 12:34 AM   Specimen: BLOOD RIGHT FOREARM  Result Value Ref Range Status   Specimen Description BLOOD RIGHT FOREARM  Final   Special Requests   Final    BOTTLES DRAWN  AEROBIC AND ANAEROBIC Blood Culture adequate volume   Culture   Final    NO GROWTH 5 DAYS Performed at [facility], [address], [place], [place] [address]    Report Status 11/01/2023 FINAL  Final  Resp panel by RT-PCR (RSV, Flu A&B, Covid) Anterior Nasal Swab     Status: Abnormal   Collection Time: 10/27/23  1:02 AM   Specimen: Anterior Nasal Swab  Result Value Ref Range Status   SARS Coronavirus 2 by RT PCR NEGATIVE NEGATIVE Final   Influenza A by PCR POSITIVE (A) NEGATIVE Final   Influenza B by PCR NEGATIVE NEGATIVE Final    Comment: (NOTE) The Xpert Xpress SARS-CoV-2/FLU/RSV plus assay is intended as an aid in the diagnosis of influenza from  Nasopharyngeal swab specimens and should not be used as a sole basis for treatment. Nasal washings and aspirates are unacceptable for Xpert Xpress SARS-CoV-2/FLU/RSV testing.  Fact Sheet for Patients: BloggerCourse.com  Fact Sheet for Healthcare Providers: SeriousBroker.it  This test is not yet approved or cleared by the [place] FDA and has been authorized for detection and/or diagnosis of SARS-CoV-2 by FDA under an Emergency Use Authorization (EUA). This EUA will remain in effect (meaning this test can be used) for the duration of the COVID-19 declaration under Section 564(b)(1) of the Act, 21 U.S.C. section 360bbb-3(b)(1), unless the authorization is terminated or revoked.     Resp Syncytial Virus by PCR NEGATIVE NEGATIVE Final    Comment: (NOTE) Fact Sheet for Patients: BloggerCourse.com  Fact Sheet for Healthcare Providers: SeriousBroker.it  This test is not yet approved or cleared by the [place] FDA and has been authorized for detection and/or diagnosis of SARS-CoV-2 by FDA under an Emergency Use Authorization (EUA). This EUA will remain in effect (meaning this test can be used) for the duration of  the COVID-19 declaration under Section 564(b)(1) of the Act, 21 U.S.C. section 360bbb-3(b)(1), unless the authorization is terminated or revoked.  Performed at [facility], [address], [place], [place] [address]   Culture, blood (routine x 2)     Status: None (Preliminary result)   Collection Time: 11/01/23 12:13 PM   Specimen: BLOOD  Result Value Ref Range Status   Specimen Description BLOOD RIGHT ANTECUBITAL  Final   Special Requests   Final    BOTTLES DRAWN AEROBIC AND ANAEROBIC Blood Culture adequate volume   Culture   Final    NO GROWTH 2 DAYS Performed at [facility], [address], [place], [place] [address]    Report Status PENDING  Incomplete  Culture, blood (routine x 2)     Status: None (Preliminary result)   Collection Time: 11/01/23 12:18 PM   Specimen: BLOOD LEFT FOREARM  Result Value Ref Range Status   Specimen Description BLOOD LEFT FOREARM  Final   Special Requests   Final    BOTTLES DRAWN AEROBIC AND ANAEROBIC Blood Culture results may not be optimal due to an inadequate volume of blood received in culture bottles   Culture   Final    NO GROWTH 2 DAYS Performed at [facility], [address] [place], [place] [address]    Report Status PENDING  Incomplete  MRSA Next Gen by PCR, Nasal     Status: None   Collection Time: 11/02/23  1:41 AM   Specimen: Nasal Mucosa; Nasal Swab  Result Value Ref Range Status   MRSA by PCR Next Gen NOT DETECTED NOT DETECTED Final    Comment: (NOTE) The GeneXpert MRSA Assay (FDA approved for NASAL specimens only), is one component of a comprehensive MRSA colonization surveillance program. It is not intended to diagnose MRSA infection nor to guide or monitor treatment for MRSA infections. Test performance is not FDA approved in patients less than 57 years old. Performed at [facility], [address] [place], [place] [address]      Radiology Studies: DG Chest Port 1 View Result Date:  11/01/2023 CLINICAL DATA:  Cough EXAM: PORTABLE CHEST 1 VIEW COMPARISON:  10/27/2023 FINDINGS: Stable heart size. Aortic atherosclerosis. Persistent but improving patchy airspace opacities bilaterally. No appreciable pleural effusion. No pneumothorax. IMPRESSION: Persistent but improving patchy airspace opacities bilaterally. Electronically Signed   By: Duanne Guess D.O.  On: 11/01/2023 14:35     Scheduled Meds:  aspirin  325 mg Oral Daily   busPIRone  5 mg Oral TID   cefUROXime  250 mg Oral BID WC   donepezil  5 mg Oral QHS   enoxaparin (LOVENOX) injection  40 mg Subcutaneous Q24H   escitalopram  20 mg Oral q morning   ipratropium-albuterol  3 mL Nebulization BID   melatonin  3 mg Oral QHS   memantine  10 mg Oral BID   metoprolol tartrate  25 mg Oral BID   multivitamin with minerals  1 tablet Oral Daily   senna-docusate  1 tablet Oral BID   traZODone  50 mg Oral QHS   Continuous Infusions:     LOS: 2 days    Time spent:    Zannie Cove, MD [facility]   11/03/2023, 11:55 AM

## 2023-11-03 NOTE — Plan of Care (Signed)

## 2023-11-04 ENCOUNTER — Inpatient Hospital Stay (HOSPITAL_COMMUNITY): Payer: Medicare Other

## 2023-11-04 DIAGNOSIS — J90 Pleural effusion, not elsewhere classified: Secondary | ICD-10-CM | POA: Diagnosis not present

## 2023-11-04 DIAGNOSIS — J9601 Acute respiratory failure with hypoxia: Secondary | ICD-10-CM | POA: Diagnosis not present

## 2023-11-04 DIAGNOSIS — R579 Shock, unspecified: Secondary | ICD-10-CM

## 2023-11-04 DIAGNOSIS — F419 Anxiety disorder, unspecified: Secondary | ICD-10-CM

## 2023-11-04 DIAGNOSIS — R578 Other shock: Secondary | ICD-10-CM

## 2023-11-04 DIAGNOSIS — J189 Pneumonia, unspecified organism: Secondary | ICD-10-CM | POA: Diagnosis not present

## 2023-11-04 DIAGNOSIS — I10 Essential (primary) hypertension: Secondary | ICD-10-CM

## 2023-11-04 LAB — COMPREHENSIVE METABOLIC PANEL
ALT: 12 U/L (ref 0–44)
AST: 14 U/L — ABNORMAL LOW (ref 15–41)
Albumin: 2.3 g/dL — ABNORMAL LOW (ref 3.5–5.0)
Alkaline Phosphatase: 54 U/L (ref 38–126)
Anion gap: 11 (ref 5–15)
BUN: 14 mg/dL (ref 8–23)
CO2: 22 mmol/L (ref 22–32)
Calcium: 8 mg/dL — ABNORMAL LOW (ref 8.9–10.3)
Chloride: 108 mmol/L (ref 98–111)
Creatinine, Ser: 0.9 mg/dL (ref 0.44–1.00)
GFR, Estimated: >60 mL/min (ref >60)
Glucose, Bld: 95 mg/dL (ref 70–99)
Potassium: 3.5 mmol/L (ref 3.5–5.1)
Sodium: 141 mmol/L (ref 135–145)
Total Bilirubin: 0.3 mg/dL (ref 0.0–1.2)
Total Protein: 5.8 g/dL — ABNORMAL LOW (ref 6.5–8.1)

## 2023-11-04 LAB — BASIC METABOLIC PANEL
Anion gap: 11 (ref 5–15)
BUN: 9 mg/dL (ref 8–23)
CO2: 26 mmol/L (ref 22–32)
Calcium: 8.4 mg/dL — ABNORMAL LOW (ref 8.9–10.3)
Chloride: 105 mmol/L (ref 98–111)
Creatinine, Ser: 0.83 mg/dL (ref 0.44–1.00)
GFR, Estimated: >60 mL/min (ref >60)
Glucose, Bld: 94 mg/dL (ref 70–99)
Potassium: 3.2 mmol/L — ABNORMAL LOW (ref 3.5–5.1)
Sodium: 142 mmol/L (ref 135–145)

## 2023-11-04 LAB — CBC
HCT: 29 % — ABNORMAL LOW (ref 36.0–46.0)
Hemoglobin: 9.6 g/dL — ABNORMAL LOW (ref 12.0–15.0)
MCH: 29.4 pg (ref 26.0–34.0)
MCHC: 33.1 g/dL (ref 30.0–36.0)
MCV: 89 fL (ref 80.0–100.0)
Platelets: 321 10*3/uL (ref 150–400)
RBC: 3.26 MIL/uL — ABNORMAL LOW (ref 3.87–5.11)
RDW: 15.6 % — ABNORMAL HIGH (ref 11.5–15.5)
WBC: 9.8 10*3/uL (ref 4.0–10.5)
nRBC: 0 % (ref 0.0–0.2)

## 2023-11-04 LAB — TROPONIN I (HIGH SENSITIVITY)
Troponin I (High Sensitivity): 51 ng/L — ABNORMAL HIGH (ref <18)
Troponin I (High Sensitivity): 52 ng/L — ABNORMAL HIGH (ref <18)

## 2023-11-04 LAB — POCT I-STAT 7, (LYTES, BLD GAS, ICA,H+H)
Acid-Base Excess: 0 mmol/L (ref 0.0–2.0)
Bicarbonate: 24.5 mmol/L (ref 20.0–28.0)
Calcium, Ion: 1.2 mmol/L (ref 1.15–1.40)
HCT: 28 % — ABNORMAL LOW (ref 36.0–46.0)
Hemoglobin: 9.5 g/dL — ABNORMAL LOW (ref 12.0–15.0)
O2 Saturation: 90 %
Patient temperature: 98.6
Potassium: 3.5 mmol/L (ref 3.5–5.1)
Sodium: 143 mmol/L (ref 135–145)
TCO2: 26 mmol/L (ref 22–32)
pCO2 arterial: 39.5 mm[Hg] (ref 32–48)
pH, Arterial: 7.401 (ref 7.35–7.45)
pO2, Arterial: 58 mm[Hg] — ABNORMAL LOW (ref 83–108)

## 2023-11-04 LAB — GLUCOSE, CAPILLARY
Glucose-Capillary: 79 mg/dL (ref 70–99)
Glucose-Capillary: 98 mg/dL (ref 70–99)

## 2023-11-04 LAB — ECHOCARDIOGRAM COMPLETE
Est EF: >75
Height: 64 in
S' Lateral: 1.7 cm
Weight: 2398.6 [oz_av]

## 2023-11-04 LAB — MAGNESIUM
Magnesium: 1.9 mg/dL (ref 1.7–2.4)
Magnesium: 1.9 mg/dL (ref 1.7–2.4)

## 2023-11-04 LAB — BLOOD GAS, ARTERIAL
Acid-base deficit: 0.6 mmol/L (ref 0.0–2.0)
Bicarbonate: 24.9 mmol/L (ref 20.0–28.0)
O2 Saturation: 99.3 %
Patient temperature: 36.2
pCO2 arterial: 42 mm[Hg] (ref 32–48)
pH, Arterial: 7.38 (ref 7.35–7.45)
pO2, Arterial: 83 mm[Hg] (ref 83–108)

## 2023-11-04 LAB — LACTIC ACID, PLASMA
Lactic Acid, Venous: 1.4 mmol/L (ref 0.5–1.9)
Lactic Acid, Venous: 1.1 mmol/L (ref 0.5–1.9)

## 2023-11-04 LAB — TSH: TSH: 0.763 u[IU]/mL (ref 0.350–4.500)

## 2023-11-04 LAB — AMMONIA: Ammonia: <10 umol/L (ref 9–35)

## 2023-11-04 LAB — CORTISOL: Cortisol, Plasma: 19.6 ug/dL

## 2023-11-04 LAB — PROCALCITONIN: Procalcitonin: 0.22 ng/mL

## 2023-11-04 LAB — T4, FREE: Free T4: 1.1 ng/dL (ref 0.61–1.12)

## 2023-11-04 LAB — PHOSPHORUS: Phosphorus: 3 mg/dL (ref 2.5–4.6)

## 2023-11-04 MED ORDER — CHLORHEXIDINE GLUCONATE CLOTH 2 % EX PADS
6.0000 | MEDICATED_PAD | Freq: Every day | CUTANEOUS | Status: DC
  Administered 2023-11-04 – 2023-11-08 (×5): 6 via TOPICAL

## 2023-11-04 MED ORDER — IPRATROPIUM BROMIDE 0.02 % IN SOLN
0.5000 mg | Freq: Four times a day (QID) | RESPIRATORY_TRACT | Status: DC | PRN
  Administered 2023-11-04: 0.5 mg via RESPIRATORY_TRACT
  Filled 2023-11-04: qty 2.5

## 2023-11-04 MED ORDER — SODIUM CHLORIDE 0.9 % IV SOLN
1.0000 g | Freq: Three times a day (TID) | INTRAVENOUS | Status: DC
  Administered 2023-11-04 – 2023-11-06 (×7): 1 g via INTRAVENOUS
  Filled 2023-11-04 (×7): qty 20

## 2023-11-04 MED ORDER — MIDODRINE HCL 5 MG PO TABS
5.0000 mg | ORAL_TABLET | Freq: Two times a day (BID) | ORAL | Status: DC
  Administered 2023-11-04: 5 mg via ORAL
  Filled 2023-11-04 (×2): qty 1

## 2023-11-04 MED ORDER — SODIUM CHLORIDE 0.9 % IV SOLN: 2.0000 g | Freq: Two times a day (BID) | INTRAVENOUS | Status: DC

## 2023-11-04 MED ORDER — LEVALBUTEROL HCL 0.63 MG/3ML IN NEBU
0.6300 mg | INHALATION_SOLUTION | Freq: Four times a day (QID) | RESPIRATORY_TRACT | Status: DC | PRN
  Administered 2023-11-04 (×2): 0.63 mg via RESPIRATORY_TRACT
  Filled 2023-11-04 (×2): qty 3

## 2023-11-04 MED ORDER — FAMOTIDINE IN NACL 20-0.9 MG/50ML-% IV SOLN
20.0000 mg | INTRAVENOUS | Status: DC
  Administered 2023-11-04: 20 mg via INTRAVENOUS
  Filled 2023-11-04: qty 50

## 2023-11-04 MED ORDER — NOREPINEPHRINE 4 MG/250ML-% IV SOLN
0.0000 ug/min | INTRAVENOUS | Status: DC
  Filled 2023-11-04: qty 250

## 2023-11-04 MED ORDER — FUROSEMIDE 10 MG/ML IJ SOLN
40.0000 mg | Freq: Two times a day (BID) | INTRAMUSCULAR | Status: DC
  Administered 2023-11-04 – 2023-11-06 (×5): 40 mg via INTRAVENOUS
  Filled 2023-11-04 (×5): qty 4

## 2023-11-04 MED ORDER — SODIUM CHLORIDE 0.9 % IV BOLUS
1500.0000 mL | Freq: Once | INTRAVENOUS | Status: AC
  Administered 2023-11-04: 1500 mL via INTRAVENOUS

## 2023-11-04 MED ORDER — SODIUM CHLORIDE 0.9 % IV BOLUS
500.0000 mL | Freq: Once | INTRAVENOUS | Status: AC
  Administered 2023-11-04: 500 mL via INTRAVENOUS

## 2023-11-04 MED ORDER — FUROSEMIDE 10 MG/ML IJ SOLN
40.0000 mg | Freq: Once | INTRAMUSCULAR | Status: AC
  Administered 2023-11-04: 40 mg via INTRAVENOUS
  Filled 2023-11-04: qty 4

## 2023-11-04 MED ORDER — POTASSIUM CHLORIDE 20 MEQ PO PACK
20.0000 meq | PACK | Freq: Once | ORAL | Status: AC
  Administered 2023-11-04: 20 meq via ORAL
  Filled 2023-11-04: qty 1

## 2023-11-04 NOTE — Significant Event (Addendum)
I was notified by patient's nurse that patient became hypotensive bradycardic and unresponsive.  Earlier in the evening patient was short of breath and was wheezing so patient was started on scheduled nebulizer.  X-ray was ordered.  On exam at bedside patient was minimally responsive to sternal rub.  Blood pressure systolic was in the 60s.  Heart rate improved to 60s at the time of my arrival.  Temperature was 98.4 respiration 18/min.  HEENT -anicteric no pallor.   Chest bilateral air entry present.  Wheezing. Heart S1-S2 heard. Abdomen soft nontender. Neuro patient is lethargic responding to sternal rub initially but has restarted fluids became more alert awake.  I reviewed patient's labs and medications and notes.  Assessment and plan -    Hypotension/unresponsive episode and bradycardia concerning for possible developing sepsis -   will hold off patient's Toprol and trazodone for now.  Blood pressure has improved after a liter and a half of fluids to 90 systolic.  Holding off further fluids for now.  May need vasopressors.  Patient's mentation also has improved at this time but still not back to baseline as per the nurse.  Patient is afebrile.  Will check labs including lactic acid blood cultures TSH cortisol metabolic panel CBC.  Critical care is at the bedside.  They are planning to broaden the antibiotics.  Tried to reach patient's legal guardian was unable to.   Midge Minium MD.

## 2023-11-04 NOTE — Progress Notes (Addendum)
PROGRESS NOTE    Christine Christensen  GNF:[id number] DOB: January 02, 1946 DOA: 11/01/2023 PCP: Fleet Contras, MD  Volume overload to 77/F with vascular dementia, CVA, mitral stenosis, chronic right lower extremity weakness, hypertension was recently hospitalized at [facility] for aphasia, cognitive deficits, eventually diagnosed with E. coli UTI and discharged back to SNF on 1/26, ..back in the ER 2/7 with cough and fever, diagnosed with influenza A, discharged back.  -Back in the ER 2/13  with cough, fever, shortness of breath. In ED Tachycardic, mildly hypoxic, labs noted WBC of 17.9, creatinine 0.8, troponin 7, lactic acid 2.0, CXR with persistent bibasilar opacities -Admitted, improving on antibiotics -2/15 overnight became hypotensive and transferred to ICU> stabilized with IV fluids, did not need pressors  Subjective: -Drowsy this morning  Assessment and Plan:  Sepsis,  Multifocal pneumonia -Presenting with fever, cough, tachycardia and leukocytosis, x-ray with bibasilar opacities -Suspected to be secondary infection following influenza A versus aspiration -was improving, changed to cefuroxime yesterday, overnight became hypotensive and antibiotics changed to meropenem, x-ray is more suggestive of fluid overload -Xopenex, flutter valve -Blood cultures negative thus far,  -If cultures remain negative de-escalate antibiotics in 24 to 48 hours -SLP eval completed.,  No overt aspiration noted -Increase activity, PT OT  Transient hypotension -Improved -Likely multifactorial, has known mitral stenosis, follow-up echo, add midodrine -Clinical suspicion for sepsis is low, follow-up blood and urine cultures, ABX as above  Mitral stenosis Acute on chronic diastolic CHF Pulmonary hypertension -Follow-up echo, clinically volume overloaded, add IV Lasix -Midodrine for soft BP   Influenza A Complete Tamiflu course  Severe hypoalbuminemia -Protein calorie malnutrition -Dietitian consulted   Sinus  tachycardia, PACs -Worsened in the setting of above, continue Toprol, improving   Dementia Anxiety, insomnia -Continue home regimen of Aricept, Namenda, BuSpar and Lexapro   History of CVA -Continue aspirin   History of UTIs -Just completed a course of antibiotics for E. Coli -UA unremarkable     DVT prophylaxis: lovenox Code Status: Code  family Communication: No family at bedside, she is a guardian of the state Disposition Plan: Back to SNF pending above workup  Consultants:    Procedures:   Antimicrobials:    Objective: Vitals:   11/04/23 0800 11/04/23 0823 11/04/23 0900 11/04/23 1000  BP: (!) 106/52   117/82  Pulse: (!) 109 (!) 106 86 (!) 119  Resp: 18 (!) 22 20 20   Temp:      TempSrc:      SpO2: 97% 96% 93% 92%  Weight:      Height:        Intake/Output Summary (Last 24 hours) at 11/04/2023 1009 Last data filed at 11/04/2023 1000 Gross per 24 hour  Intake 1733.02 ml  Output 500 ml  Net 1233.02 ml   Filed Weights   11/01/23 1217  Weight: 68 kg    Examination:  General exam: Appears calm and comfortable, AAOx2 Respiratory system: Few rhonchi at the bases Cardiovascular system: S1 & S2 heard, RRR.  Systolic murmur Abd: nondistended, soft and nontender.Normal bowel sounds heard. Central nervous system: Awake alert, chronic right leg weakness 6 Extremities: no edema Skin: No rashes Psychiatry:  Mood & affect appropriate.     Data Reviewed:   CBC: Recent Labs  Lab 11/01/23 1235 11/01/23 1243 11/01/23 1547 11/02/23 0722 11/03/23 0259 11/04/23 0348 11/04/23 0910  WBC 17.9*  --  17.8* 11.0* 10.4 9.8  --   NEUTROABS 15.5*  --   --   --   --   --   --  HGB 11.3*   < > 11.2* 9.0* 9.3* 9.6* 9.5*  HCT 33.5*   < > 34.2* 27.7* 28.8* 29.0* 28.0*  MCV 86.8  --  87.7 88.2 88.1 89.0  --   PLT 219  --  228 216 268 321  --    < > = values in this interval not displayed.   Basic Metabolic Panel: Recent Labs  Lab 11/01/23 1235 11/01/23 1243  11/01/23 1547 11/02/23 0722 11/03/23 0259 11/04/23 0348 11/04/23 0910  NA 138 140  --  138 140 141 143  K 3.6 3.6  --  3.5 3.6 3.5 3.5  CL 103  --   --  104 106 108  --   CO2 26  --   --  25 25 22   --   GLUCOSE 129*  --   --  92 90 95  --   BUN 11  --   --  11 9 14   --   CREATININE 0.83  --  0.81 0.85 0.84 0.90  --   CALCIUM 8.7*  --   --  7.6* 8.3* 8.0*  --   MG  --   --   --   --   --  1.9  --   PHOS  --   --   --   --   --  3.0  --    GFR: Estimated Creatinine Clearance: 48.8 mL/min (by C-G formula based on SCr of 0.9 mg/dL). Liver Function Tests: Recent Labs  Lab 11/01/23 1235 11/02/23 0722 11/04/23 0348  AST 21 15 14*  ALT 16 12 12   ALKPHOS 74 58 54  BILITOT 1.0 0.7 0.3  PROT 6.8 5.1* 5.8*  ALBUMIN 2.7* 2.0* 2.3*   No results for input(s): "LIPASE", "AMYLASE" in the last 168 hours. Recent Labs  Lab 11/04/23 0348  AMMONIA <10   Coagulation Profile: No results for input(s): "INR", "PROTIME" in the last 168 hours.  Cardiac Enzymes: No results for input(s): "CKTOTAL", "CKMB", "CKMBINDEX", "TROPONINI" in the last 168 hours. BNP (last 3 results) No results for input(s): "PROBNP" in the last 8760 hours. HbA1C: No results for input(s): "HGBA1C" in the last 72 hours. CBG: Recent Labs  Lab 11/04/23 0037 11/04/23 0238  GLUCAP 98 79   Lipid Profile: No results for input(s): "CHOL", "HDL", "LDLCALC", "TRIG", "CHOLHDL", "LDLDIRECT" in the last 72 hours. Thyroid Function Tests: Recent Labs    11/04/23 0348 11/04/23 0352  TSH 0.763  --   FREET4  --  1.10   Anemia Panel: No results for input(s): "VITAMINB12", "FOLATE", "FERRITIN", "TIBC", "IRON", "RETICCTPCT" in the last 72 hours. Urine analysis:    Component Value Date/Time   COLORURINE AMBER (A) 11/01/2023 2243   APPEARANCEUR CLEAR 11/01/2023 2243   LABSPEC 1.017 11/01/2023 2243   PHURINE 5.0 11/01/2023 2243   GLUCOSEU NEGATIVE 11/01/2023 2243   HGBUR NEGATIVE 11/01/2023 2243   BILIRUBINUR NEGATIVE  11/01/2023 2243   KETONESUR NEGATIVE 11/01/2023 2243   PROTEINUR NEGATIVE 11/01/2023 2243   NITRITE NEGATIVE 11/01/2023 2243   LEUKOCYTESUR NEGATIVE 11/01/2023 2243   Sepsis Labs: @LABRCNTIP (procalcitonin:4,lacticidven:4)  ) Recent Results (from the past 240 hours)  Blood Culture (routine x 2)     Status: None   Collection Time: 10/27/23 12:34 AM   Specimen: BLOOD RIGHT HAND  Result Value Ref Range Status   Specimen Description BLOOD RIGHT HAND  Final   Special Requests   Final    BOTTLES DRAWN AEROBIC AND ANAEROBIC Blood Culture adequate volume  Culture   Final    NO GROWTH 5 DAYS Performed at [facility], [address] [place], [place] [address]    Report Status 11/01/2023 FINAL  Final  Blood Culture (routine x 2)     Status: None   Collection Time: 10/27/23 12:34 AM   Specimen: BLOOD RIGHT FOREARM  Result Value Ref Range Status   Specimen Description BLOOD RIGHT FOREARM  Final   Special Requests   Final    BOTTLES DRAWN AEROBIC AND ANAEROBIC Blood Culture adequate volume   Culture   Final    NO GROWTH 5 DAYS Performed at [facility], [address], [place], [place] [address]    Report Status 11/01/2023 FINAL  Final  Resp panel by RT-PCR (RSV, Flu A&B, Covid) Anterior Nasal Swab     Status: Abnormal   Collection Time: 10/27/23  1:02 AM   Specimen: Anterior Nasal Swab  Result Value Ref Range Status   SARS Coronavirus 2 by RT PCR NEGATIVE NEGATIVE Final   Influenza A by PCR POSITIVE (A) NEGATIVE Final   Influenza B by PCR NEGATIVE NEGATIVE Final    Comment: (NOTE) The Xpert Xpress SARS-CoV-2/FLU/RSV plus assay is intended as an aid in the diagnosis of influenza from Nasopharyngeal swab specimens and should not be used as a sole basis for treatment. Nasal washings and aspirates are unacceptable for Xpert Xpress SARS-CoV-2/FLU/RSV testing.  Fact Sheet for Patients: BloggerCourse.com  Fact Sheet for Healthcare  Providers: SeriousBroker.it  This test is not yet approved or cleared by the [place] FDA and has been authorized for detection and/or diagnosis of SARS-CoV-2 by FDA under an Emergency Use Authorization (EUA). This EUA will remain in effect (meaning this test can be used) for the duration of the COVID-19 declaration under Section 564(b)(1) of the Act, 21 U.S.C. section 360bbb-3(b)(1), unless the authorization is terminated or revoked.     Resp Syncytial Virus by PCR NEGATIVE NEGATIVE Final    Comment: (NOTE) Fact Sheet for Patients: BloggerCourse.com  Fact Sheet for Healthcare Providers: SeriousBroker.it  This test is not yet approved or cleared by the [place] FDA and has been authorized for detection and/or diagnosis of SARS-CoV-2 by FDA under an Emergency Use Authorization (EUA). This EUA will remain in effect (meaning this test can be used) for the duration of the COVID-19 declaration under Section 564(b)(1) of the Act, 21 U.S.C. section 360bbb-3(b)(1), unless the authorization is terminated or revoked.  Performed at [facility], [address] [place], [place] [address]   Culture, blood (routine x 2)     Status: None (Preliminary result)   Collection Time: 11/01/23 12:13 PM   Specimen: BLOOD  Result Value Ref Range Status   Specimen Description BLOOD RIGHT ANTECUBITAL  Final   Special Requests   Final    BOTTLES DRAWN AEROBIC AND ANAEROBIC Blood Culture adequate volume   Culture   Final    NO GROWTH 3 DAYS Performed at [facility], [address] [place], [place] [address]    Report Status PENDING  Incomplete  Culture, blood (routine x 2)     Status: None (Preliminary result)   Collection Time: 11/01/23 12:18 PM   Specimen: BLOOD LEFT FOREARM  Result Value Ref Range Status   Specimen Description BLOOD LEFT FOREARM  Final   Special Requests   Final    BOTTLES DRAWN  AEROBIC AND ANAEROBIC Blood Culture results may not be optimal due to an inadequate volume of  blood received in culture bottles   Culture   Final    NO GROWTH 3 DAYS Performed at [facility], [address] [place], [place] [address]    Report Status PENDING  Incomplete  MRSA Next Gen by PCR, Nasal     Status: None   Collection Time: 11/02/23  1:41 AM   Specimen: Nasal Mucosa; Nasal Swab  Result Value Ref Range Status   MRSA by PCR Next Gen NOT DETECTED NOT DETECTED Final    Comment: (NOTE) The GeneXpert MRSA Assay (FDA approved for NASAL specimens only), is one component of a comprehensive MRSA colonization surveillance program. It is not intended to diagnose MRSA infection nor to guide or monitor treatment for MRSA infections. Test performance is not FDA approved in patients less than 85 years old. Performed at [facility], [address], [place], [place] [address]      Radiology Studies: DG Chest Port 1 View Result Date: 11/03/2023 CLINICAL DATA:  Dyspnea EXAM: PORTABLE CHEST 1 VIEW COMPARISON:  10/31/2013 FINDINGS: Moderate bilateral pleural effusions are present, enlarged since prior examination. Central pulmonary vascular congestion and superimposed perihilar interstitial pulmonary edema appears progressive since prior examination. No pneumothorax. Cardiac size is within normal limits. No acute bone abnormality. IMPRESSION: 1. Progressive congestive heart failure. Electronically Signed   By: Helyn Numbers M.D.   On: 11/03/2023 23:57     Scheduled Meds:  aspirin  325 mg Oral Daily   busPIRone  5 mg Oral TID   Chlorhexidine Gluconate Cloth  6 each Topical Daily   donepezil  5 mg Oral QHS   enoxaparin (LOVENOX) injection  40 mg Subcutaneous Q24H   escitalopram  20 mg Oral q morning   melatonin  3 mg Oral QHS   memantine  10 mg Oral BID   multivitamin with minerals  1 tablet Oral Daily   senna-docusate  1 tablet Oral BID   Continuous Infusions:  famotidine  (PEPCID) IV Stopped (11/04/23 0515)   meropenem (MERREM) IV 1 g (11/04/23 0958)      LOS: 3 days    Time spent:    Zannie Cove, MD [facility]   11/04/2023, 10:09 AM

## 2023-11-04 NOTE — Progress Notes (Signed)
eLink Physician-Brief Progress Note Patient Name: Christine Christensen DOB: 1946/08/06 MRN: [id number]   Date of Service  11/04/2023  HPI/Events of Note  78 yr old female with prior CVA and mitral stenosis admitted 2/13 with multifocal pna following influenza A infx who became hypotensive and hypoxic today with increased infiltrates.   Abx broadened.  Remains on [place] oxygen.  Levophed ordered.  eICU Interventions  Chart reviewed     Intervention Category Evaluation Type: New Patient Evaluation  Henry Russel, P 11/04/2023, 4:30 AM

## 2023-11-04 NOTE — Progress Notes (Signed)
RT attempted to obtain ABG x 2 without success. RN notified.

## 2023-11-04 NOTE — Consult Note (Signed)
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 She was admitted to [facility] with UTI end of Jan 2025 for UTI and then with influenza A infection and PNA 2/13, 2025, Rx with Tamiflu, Rocephin and Flagyl. She was switched to Ceftin yesterday. Metoprolol was given 11 pm last night (usually she was on 12.5 mg bid, increased to 25 mg bid for sinus tachycardia since admission). No other meds changes. The patient was interactive, responsive, and easy to arouse since admission, on RA, with stable V/S  and afebrile. Today, early am she became hypotensive, bradycardic, hypoxic (SpO2 85% on RA), and unresponsive. No arrhythmia on tele. She has mild wheezing and crackles, started on scheduled nebulizer. Given 1.5 L NS 0.9% bolus. Started on O2 @ 2 L [place]. X-ray showed worsening infiltrates and bilateral pleural effusions. No vomiting or diarrhea. Noted mild LL edema by the RN tonight.   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 rate (!) 24, height 5\' 4"  (1.626 m), weight 68 kg, SpO2 99%.        Intake/Output Summary (Last 24 hours) at 11/04/2023 0205 Last data filed at 11/03/2023 1700 Gross per 24 hour  Intake 980 ml  Output 0 ml  Net 980 ml   Filed  Weights   11/01/23 1217  Weight: 68 kg    Examination: General: lethargic, could not assess orientation. Comfortable. On 2 L [place], SpO2 95%.  HENT: PERL, normal pharynx and oral mucosa. No LNE or thyromegaly. JVD Lungs: symmetrical air entry bilaterally, with fine crackles and exp wheezing Cardiovascular: NL S1/S2. No m/g/r Abdomen: no distension or tenderness Extremities: trace edema. Symmetrical  Neuro: RLE +4/5. Other limbs are strong, withdraws to pain. Mild left sided facial weakness, (per the RN, chronic for her).   Minimally responsive to sternal rub.    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  -transfer to ICU -ABG -Echo -Serial LA and Trop -PCT -Repeat labs -Levophed for MAP>65 mmHg -Meropenem -d/c Ceftin -Cortisol level -TFT -ReCx  S/p Rx for influenza A PNA and bacterial coverage  -as above -Xopenex and Atrovent PRN  Acute hypoxia due to pleural effusions, PNA, abd pulm edema -ABG  Moderate PCM -Dietitian consult  Dementia, Anxiety/insomnia -Resume home meds  HTN  -d/c Metoprolol for now  Best Practice (right click and "Reselect all SmartList Selections" daily)   Diet/type: NPO w/ oral meds DVT prophylaxis LMWH Pressure ulcer(s): N/A GI prophylaxis: H2B Lines: N/A Foley:  N/A Code Status:  full code Last date of multidisciplinary goals of care discussion []   Labs   CBC: Recent Labs  Lab 11/01/23 1235 11/01/23 1243 11/01/23 1547 11/02/23 0722 11/03/23 0259  WBC 17.9*  --  17.8* 11.0* 10.4  NEUTROABS 15.5*  --   --   --   --   HGB 11.3* 11.6* 11.2* 9.0* 9.3*  HCT 33.5* 34.0* 34.2* 27.7* 28.8*  MCV 86.8  --  87.7 88.2 88.1  PLT 219  --  228 216 268    Basic Metabolic Panel: Recent Labs  Lab 11/01/23 1235 11/01/23 1243 11/01/23 1547 11/02/23 0722 11/03/23 0259  NA 138 140   --  138 140  K 3.6 3.6  --  3.5 3.6  CL 103  --   --  104 106  CO2 26  --   --  25 25  GLUCOSE 129*  --   --  92 90  BUN 11  --   --  11 9  CREATININE 0.83  --  0.81 0.85 0.84  CALCIUM 8.7*  --   --  7.6* 8.3*   GFR: Estimated Creatinine Clearance: 52.3 mL/min (by C-G formula based on SCr of 0.84 mg/dL). Recent Labs  Lab 11/01/23 1235 11/01/23 1404 11/01/23 1547 11/02/23 0722 11/03/23 0259  WBC 17.9*  --  17.8* 11.0* 10.4  LATICACIDVEN  --  2.0*  --   --   --     Liver Function Tests: Recent Labs  Lab 11/01/23 1235 11/02/23 0722  AST 21 15  ALT 16 12  ALKPHOS 74 58  BILITOT 1.0 0.7  PROT 6.8 5.1*  ALBUMIN 2.7* 2.0*   No results for input(s): "LIPASE", "AMYLASE" in the last 168 hours. No results for input(s): "AMMONIA" in the last 168 hours.  ABG    Component Value Date/Time   PHART 7.42 11/01/2023 1847   PCO2ART 38 11/01/2023 1847   PO2ART 103 11/01/2023 1847   HCO3 24.6 11/01/2023 1847   TCO2 28 11/01/2023 1243   O2SAT 99.6 11/01/2023 1847     Coagulation Profile: No results for input(s): "INR", "PROTIME" in the last 168 hours.  Cardiac Enzymes: No results for input(s): "CKTOTAL", "CKMB", "CKMBINDEX", "TROPONINI" in the last 168 hours.  HbA1C: Hgb A1c MFr Bld  Date/Time Value Ref Range Status  10/12/2023 08:39 PM 5.8 (H) 4.8 - 5.6 % Final    Comment:    (NOTE) Pre diabetes:          5.7%-6.4%  Diabetes:              >6.4%  Glycemic control for   <7.0% adults with diabetes     CBG: Recent Labs  Lab 11/04/23 0037  GLUCAP 98    Review of Systems:   AMS  Past Medical History:  She,  has a past medical history of Anemia, Aortic stenosis, Arthritis, Heart murmur, Hypertension, Mitral stenosis, Stroke (HCC), Syncope and collapse (10/25/2017), and Urinary incontinence.   Surgical History:   Past Surgical History:  Procedure Laterality Date   CATARACT EXTRACTION, BILATERAL Bilateral    CHOLECYSTECTOMY     HIP ARTHROPLASTY Right 06/14/2021    Procedure: ARTHROPLASTY BIPOLAR HIP (HEMIARTHROPLASTY);  Surgeon: Joen Laura, MD;  Location: [facility];  Service: Orthopedics;  Laterality: Right;   TONSILLECTOMY     TOTAL KNEE ARTHROPLASTY Right    right knee 2006   TOTAL KNEE ARTHROPLASTY Left 08/06/2017   Procedure: LEFT TOTAL KNEE ARTHROPLASTY;  Surgeon: Dannielle Huh, MD;  Location: [facility] OR;  Service: Orthopedics;  Laterality: Left;   TUBAL LIGATION       Social History:  reports that she has quit smoking. Her smoking use included cigarettes. She has a 28 pack-year smoking history. She has never used smokeless tobacco. She reports that she does not currently use alcohol. She reports that she does not currently use drugs after having used the following drugs: Marijuana.   Family History:  Her family history includes Cerebral palsy in her sister; Dementia in her mother; Heart disease in her father.   Allergies Allergies  Allergen Reactions   Penicillins Other (See Comments)    Unknown reaction     Home Medications  Prior to Admission medications   Medication Sig Start Date End Date Taking? Authorizing Provider  acetaminophen (TYLENOL) 500 MG tablet Take 2 tablets (1,000 mg total) by mouth every 6 (six) hours as needed for mild pain, moderate pain, fever or headache. 06/25/21  Yes Regalado, Belkys A, MD  albuterol (VENTOLIN HFA) 108 (90 Base) MCG/ACT inhaler Inhale 2 puffs into the lungs every 4 (four) hours as needed for wheezing or shortness of breath. 11/04/21  Yes Leroy Sea, MD  aspirin 325 MG tablet Take 1 tablet (325 mg total) by mouth daily. 06/25/21  Yes Regalado, Belkys A, MD  azithromycin (ZITHROMAX Z-PAK) 250 MG tablet 500 mg given in ER on day 1, Give 250 mg once a day for days 2, 3, 4, and 5. 10/28/23  Yes Cardama, Amadeo Garnet, MD  busPIRone (BUSPAR) 5 MG tablet Take 5 mg by mouth 3 (three) times daily. 09/11/23  Yes [provider]  donepezil (ARICEPT) 5 MG tablet Take 5 mg by mouth in the morning  and at bedtime. 09/27/23  Yes [provider]  escitalopram (LEXAPRO) 20 MG tablet Take 20 mg by mouth every morning.   Yes [provider]  fexofenadine (ALLEGRA) 180 MG tablet Take 180 mg by mouth daily as needed for allergies or rhinitis.   Yes [provider]  LACTOBACILLUS PO Take 1 capsule by mouth in the morning and at bedtime.   Yes [provider]  melatonin 3 MG TABS tablet Take 1 tablet (3 mg total) by mouth at bedtime. 06/25/21  Yes Regalado, Belkys A, MD  memantine (NAMENDA) 10 MG tablet Take 10 mg by mouth 2 (two) times daily. 10/04/23  Yes [provider]  metoprolol tartrate (LOPRESSOR) 25 MG tablet Take 0.5 tablets (12.5 mg total) by mouth 2 (two) times daily. 06/25/21  Yes Regalado, Belkys A, MD  Multiple Vitamin (MULTIVITAMIN WITH MINERALS) TABS tablet Take 1 tablet by mouth daily. 06/25/21  Yes Regalado, Belkys A, MD  nitrofurantoin, macrocrystal-monohydrate, (MACROBID) 100 MG capsule Take 1 capsule (100 mg total) by mouth 2 (two) times daily. 10/27/23  Yes Cardama, Amadeo Garnet, MD  oseltamivir (TAMIFLU) 75 MG capsule Take 1 capsule (75 mg total) by mouth every 12 (twelve) hours. 10/27/23  Yes Cardama, Amadeo Garnet, MD  polyethylene glycol (MIRALAX / GLYCOLAX) 17 g packet Take 17 g by mouth 2 (two) times daily. 06/25/21  Yes Regalado, Belkys A, MD  senna-docusate (SENOKOT-S) 8.6-50 MG tablet Take 1 tablet by mouth 2 (two) times daily. 06/25/21  Yes Regalado, Belkys A, MD  traZODone (DESYREL) 50 MG tablet Take 50 mg by mouth at bedtime. 07/11/23  Yes [provider]     Critical care time: 58 min

## 2023-11-05 ENCOUNTER — Inpatient Hospital Stay (HOSPITAL_COMMUNITY): Payer: Medicare Other

## 2023-11-05 DIAGNOSIS — J189 Pneumonia, unspecified organism: Secondary | ICD-10-CM | POA: Diagnosis not present

## 2023-11-05 LAB — BASIC METABOLIC PANEL
Anion gap: 11 (ref 5–15)
BUN: 8 mg/dL (ref 8–23)
CO2: 26 mmol/L (ref 22–32)
Calcium: 8.7 mg/dL — ABNORMAL LOW (ref 8.9–10.3)
Chloride: 103 mmol/L (ref 98–111)
Creatinine, Ser: 0.75 mg/dL (ref 0.44–1.00)
GFR, Estimated: >60 mL/min (ref >60)
Glucose, Bld: 90 mg/dL (ref 70–99)
Potassium: 3.5 mmol/L (ref 3.5–5.1)
Sodium: 140 mmol/L (ref 135–145)

## 2023-11-05 LAB — CBC
HCT: 30.5 % — ABNORMAL LOW (ref 36.0–46.0)
Hemoglobin: 10.1 g/dL — ABNORMAL LOW (ref 12.0–15.0)
MCH: 28.9 pg (ref 26.0–34.0)
MCHC: 33.1 g/dL (ref 30.0–36.0)
MCV: 87.1 fL (ref 80.0–100.0)
Platelets: 403 10*3/uL — ABNORMAL HIGH (ref 150–400)
RBC: 3.5 MIL/uL — ABNORMAL LOW (ref 3.87–5.11)
RDW: 15.5 % (ref 11.5–15.5)
WBC: 8.7 10*3/uL (ref 4.0–10.5)
nRBC: 0 % (ref 0.0–0.2)

## 2023-11-05 MED ORDER — POTASSIUM CHLORIDE 20 MEQ PO PACK
40.0000 meq | PACK | Freq: Two times a day (BID) | ORAL | Status: AC
  Administered 2023-11-05 (×2): 40 meq via ORAL
  Filled 2023-11-05 (×2): qty 2

## 2023-11-05 MED ORDER — METOPROLOL TARTRATE 25 MG PO TABS
25.0000 mg | ORAL_TABLET | Freq: Two times a day (BID) | ORAL | Status: DC
  Administered 2023-11-05 – 2023-11-08 (×7): 25 mg via ORAL
  Filled 2023-11-05 (×7): qty 1

## 2023-11-05 MED ORDER — MAGNESIUM SULFATE 2 GM/50ML IV SOLN
2.0000 g | Freq: Once | INTRAVENOUS | Status: AC
  Administered 2023-11-05: 2 g via INTRAVENOUS
  Filled 2023-11-05: qty 50

## 2023-11-05 MED ORDER — ENSURE ENLIVE PO LIQD
237.0000 mL | Freq: Three times a day (TID) | ORAL | Status: DC
  Administered 2023-11-05 – 2023-11-08 (×8): 237 mL via ORAL

## 2023-11-05 NOTE — Progress Notes (Addendum)
PROGRESS NOTE    Christine Christensen  EAV:[id number] DOB: [birth year] DOA: 11/01/2023 PCP: Fleet Contras, MD  77/F with vascular dementia, CVA, mitral stenosis, chronic right lower extremity weakness, hypertension was recently hospitalized at [facility] for aphasia, cognitive deficits, eventually diagnosed with E. coli UTI and discharged back to SNF on 1/26, ..back in the ER 2/7 with cough and fever, diagnosed with influenza A, discharged back.  -Back in the ER 2/13  with cough, fever, shortness of breath. In ED Tachycardic, mildly hypoxic, labs noted WBC of 17.9, creatinine 0.8, troponin 7, lactic acid 2.0, CXR with persistent bibasilar opacities -Admitted, improving on antibiotics -2/15 overnight became hypotensive and transferred to ICU> stabilized with IV fluids, did not need pressors -2/16, fluids discontinued, started on IV Lasix, echo was poor study, will need repeat to better assess mitral valve  Subjective: -Still in 2H, less agitation overnight, restraints off -Hypertensive this morning  Assessment and Plan:  Sepsis,  Multifocal pneumonia -Presenting with fever, cough, tachycardia and leukocytosis, x-ray with bibasilar opacities -Suspected to be secondary infection following influenza A versus aspiration -was improving, changed to cefuroxime 2/15 overnight became hypotensive and antibiotics changed to meropenem per PCCM, x-ray is more suggestive of fluid overload, will transition back to oral antibiotics tomorrow -Blood cultures negative thus far,  -SLP eval completed.,  No overt aspiration noted -Increase activity, PT OT  Transient hypotension -Improved -Likely multifactorial, has known mitral stenosis, unfortunately repeat echo was a poor study, will need to be repeated in 48 hours  H/o Severe Mitral stenosis Acute on chronic diastolic CHF Pulmonary hypertension -Echo with hyperdynamic LVEF> 75%, poor study, valves could not be well assessed, will repeat echo tomorrow -Hypertensive  this morning, increase hydralazine, continue IV Lasix   Influenza A Completed Tamiflu course  Severe hypoalbuminemia -Protein calorie malnutrition -Dietitian consulted   Sinus tachycardia, PACs -Worsened in the setting of above, continue Toprol, improving -Discontinue Xopenex   Dementia Anxiety, insomnia -Continue home regimen of Aricept, Namenda, BuSpar and Lexapro   History of CVA -Continue aspirin   History of UTIs -Just completed a course of antibiotics for E. Coli -UA unremarkable     DVT prophylaxis: lovenox Code Status: Code  family Communication: No family at bedside, she is a guardian of the state, left message for guardian DSS Disposition Plan: Likely back to SNF once stable  Consultants:    Procedures:   Antimicrobials:    Objective: Vitals:   11/05/23 0600 11/05/23 0700 11/05/23 0800 11/05/23 0850  BP: (!) 152/97 (!) 148/115 (!) 155/104 (!) 155/104  Pulse: (!) 110 (!) 105 (!) 116 (!) 135  Resp: 20 18 (!) 24   Temp:      TempSrc:      SpO2: 90% 91% 92%   Weight:      Height:        Intake/Output Summary (Last 24 hours) at 11/05/2023 7829 Last data filed at 11/05/2023 0900 Gross per 24 hour  Intake 835.28 ml  Output 2850 ml  Net -2014.72 ml   Filed Weights   11/01/23 1217  Weight: 68 kg    Examination:  General exam: Chronically ill cachectic female laying in bed, AAOx3 HEENT: Positive JVD CVS: S1-S2, regular rhythm, diastolic murmur, tachycardic Lungs: Few basilar Rales Abdomen: Soft, nontender, bowel sounds present Extremities: No edema  neuro: Chronic right leg weakness  Skin: No rashes Psychiatry: Flat affect, poor insight and judgment    Data Reviewed:   CBC: Recent Labs  Lab 11/01/23 1235 11/01/23 1243 11/01/23  1547 11/02/23 0722 11/03/23 0259 11/04/23 0348 11/04/23 0910 11/05/23 0422  WBC 17.9*  --  17.8* 11.0* 10.4 9.8  --  8.7  NEUTROABS 15.5*  --   --   --   --   --   --   --   HGB 11.3*   < > 11.2* 9.0*  9.3* 9.6* 9.5* 10.1*  HCT 33.5*   < > 34.2* 27.7* 28.8* 29.0* 28.0* 30.5*  MCV 86.8  --  87.7 88.2 88.1 89.0  --  87.1  PLT 219  --  228 216 268 321  --  403*   < > = values in this interval not displayed.   Basic Metabolic Panel: Recent Labs  Lab 11/02/23 0722 11/03/23 0259 11/04/23 0348 11/04/23 0910 11/04/23 2024 11/05/23 0422  NA 138 140 141 143 142 140  K 3.5 3.6 3.5 3.5 3.2* 3.5  CL 104 106 108  --  105 103  CO2 25 25 22   --  26 26  GLUCOSE 92 90 95  --  94 90  BUN 11 9 14   --  9 8  CREATININE 0.85 0.84 0.90  --  0.83 0.75  CALCIUM 7.6* 8.3* 8.0*  --  8.4* 8.7*  MG  --   --  1.9  --  1.9  --   PHOS  --   --  3.0  --   --   --    GFR: Estimated Creatinine Clearance: 54.9 mL/min (by C-G formula based on SCr of 0.75 mg/dL). Liver Function Tests: Recent Labs  Lab 11/01/23 1235 11/02/23 0722 11/04/23 0348  AST 21 15 14*  ALT 16 12 12   ALKPHOS 74 58 54  BILITOT 1.0 0.7 0.3  PROT 6.8 5.1* 5.8*  ALBUMIN 2.7* 2.0* 2.3*   No results for input(s): "LIPASE", "AMYLASE" in the last 168 hours. Recent Labs  Lab 11/04/23 0348  AMMONIA <10   Coagulation Profile: No results for input(s): "INR", "PROTIME" in the last 168 hours.  Cardiac Enzymes: No results for input(s): "CKTOTAL", "CKMB", "CKMBINDEX", "TROPONINI" in the last 168 hours. BNP (last 3 results) No results for input(s): "PROBNP" in the last 8760 hours. HbA1C: No results for input(s): "HGBA1C" in the last 72 hours. CBG: Recent Labs  Lab 11/04/23 0037 11/04/23 0238  GLUCAP 98 79   Lipid Profile: No results for input(s): "CHOL", "HDL", "LDLCALC", "TRIG", "CHOLHDL", "LDLDIRECT" in the last 72 hours. Thyroid Function Tests: Recent Labs    11/04/23 0348 11/04/23 0352  TSH 0.763  --   FREET4  --  1.10   Anemia Panel: No results for input(s): "VITAMINB12", "FOLATE", "FERRITIN", "TIBC", "IRON", "RETICCTPCT" in the last 72 hours. Urine analysis:    Component Value Date/Time   COLORURINE AMBER (A)  11/01/2023 2243   APPEARANCEUR CLEAR 11/01/2023 2243   LABSPEC 1.017 11/01/2023 2243   PHURINE 5.0 11/01/2023 2243   GLUCOSEU NEGATIVE 11/01/2023 2243   HGBUR NEGATIVE 11/01/2023 2243   BILIRUBINUR NEGATIVE 11/01/2023 2243   KETONESUR NEGATIVE 11/01/2023 2243   PROTEINUR NEGATIVE 11/01/2023 2243   NITRITE NEGATIVE 11/01/2023 2243   LEUKOCYTESUR NEGATIVE 11/01/2023 2243   Sepsis Labs: @LABRCNTIP (procalcitonin:4,lacticidven:4)  ) Recent Results (from the past 240 hours)  Blood Culture (routine x 2)     Status: None   Collection Time: 10/27/23 12:34 AM   Specimen: BLOOD RIGHT HAND  Result Value Ref Range Status   Specimen Description BLOOD RIGHT HAND  Final   Special Requests   Final    BOTTLES  DRAWN AEROBIC AND ANAEROBIC Blood Culture adequate volume   Culture   Final    NO GROWTH 5 DAYS Performed at [facility], [address], [place], [place] [address]    Report Status 11/01/2023 FINAL  Final  Blood Culture (routine x 2)     Status: None   Collection Time: 10/27/23 12:34 AM   Specimen: BLOOD RIGHT FOREARM  Result Value Ref Range Status   Specimen Description BLOOD RIGHT FOREARM  Final   Special Requests   Final    BOTTLES DRAWN AEROBIC AND ANAEROBIC Blood Culture adequate volume   Culture   Final    NO GROWTH 5 DAYS Performed at [facility], [address] [place], [place] [address]    Report Status 11/01/2023 FINAL  Final  Resp panel by RT-PCR (RSV, Flu A&B, Covid) Anterior Nasal Swab     Status: Abnormal   Collection Time: 10/27/23  1:02 AM   Specimen: Anterior Nasal Swab  Result Value Ref Range Status   SARS Coronavirus 2 by RT PCR NEGATIVE NEGATIVE Final   Influenza A by PCR POSITIVE (A) NEGATIVE Final   Influenza B by PCR NEGATIVE NEGATIVE Final    Comment: (NOTE) The Xpert Xpress SARS-CoV-2/FLU/RSV plus assay is intended as an aid in the diagnosis of influenza from Nasopharyngeal swab specimens and should not be used as a sole basis for  treatment. Nasal washings and aspirates are unacceptable for Xpert Xpress SARS-CoV-2/FLU/RSV testing.  Fact Sheet for Patients: BloggerCourse.com  Fact Sheet for Healthcare Providers: SeriousBroker.it  This test is not yet approved or cleared by the [place] FDA and has been authorized for detection and/or diagnosis of SARS-CoV-2 by FDA under an Emergency Use Authorization (EUA). This EUA will remain in effect (meaning this test can be used) for the duration of the COVID-19 declaration under Section 564(b)(1) of the Act, 21 U.S.C. section 360bbb-3(b)(1), unless the authorization is terminated or revoked.     Resp Syncytial Virus by PCR NEGATIVE NEGATIVE Final    Comment: (NOTE) Fact Sheet for Patients: BloggerCourse.com  Fact Sheet for Healthcare Providers: SeriousBroker.it  This test is not yet approved or cleared by the [place] FDA and has been authorized for detection and/or diagnosis of SARS-CoV-2 by FDA under an Emergency Use Authorization (EUA). This EUA will remain in effect (meaning this test can be used) for the duration of the COVID-19 declaration under Section 564(b)(1) of the Act, 21 U.S.C. section 360bbb-3(b)(1), unless the authorization is terminated or revoked.  Performed at [facility], [address] [place], [place] [address]   Culture, blood (routine x 2)     Status: None (Preliminary result)   Collection Time: 11/01/23 12:13 PM   Specimen: BLOOD  Result Value Ref Range Status   Specimen Description BLOOD RIGHT ANTECUBITAL  Final   Special Requests   Final    BOTTLES DRAWN AEROBIC AND ANAEROBIC Blood Culture adequate volume   Culture   Final    NO GROWTH 4 DAYS Performed at [facility], [address], [place], [place] [address]    Report Status PENDING  Incomplete  Culture, blood (routine x 2)     Status: None (Preliminary  result)   Collection Time: 11/01/23 12:18 PM   Specimen: BLOOD LEFT FOREARM  Result Value Ref Range Status   Specimen Description BLOOD LEFT FOREARM  Final   Special Requests   Final    BOTTLES DRAWN AEROBIC AND ANAEROBIC Blood Culture results  may not be optimal due to an inadequate volume of blood received in culture bottles   Culture   Final    NO GROWTH 4 DAYS Performed at [facility], [address] [place], [place] [address]    Report Status PENDING  Incomplete  MRSA Next Gen by PCR, Nasal     Status: None   Collection Time: 11/02/23  1:41 AM   Specimen: Nasal Mucosa; Nasal Swab  Result Value Ref Range Status   MRSA by PCR Next Gen NOT DETECTED NOT DETECTED Final    Comment: (NOTE) The GeneXpert MRSA Assay (FDA approved for NASAL specimens only), is one component of a comprehensive MRSA colonization surveillance program. It is not intended to diagnose MRSA infection nor to guide or monitor treatment for MRSA infections. Test performance is not FDA approved in patients less than 75 years old. Performed at [facility], [address] [place], [place] [address]   Culture, blood (Routine X 2) w Reflex to ID Panel     Status: None (Preliminary result)   Collection Time: 11/04/23  3:47 AM   Specimen: BLOOD RIGHT HAND  Result Value Ref Range Status   Specimen Description BLOOD RIGHT HAND  Final   Special Requests   Final    BOTTLES DRAWN AEROBIC AND ANAEROBIC Blood Culture results may not be optimal due to an inadequate volume of blood received in culture bottles   Culture   Final    NO GROWTH 1 DAY Performed at [facility], [address], [place], [place] [address]    Report Status PENDING  Incomplete  Culture, blood (Routine X 2) w Reflex to ID Panel     Status: None (Preliminary result)   Collection Time: 11/04/23  3:47 AM   Specimen: BLOOD LEFT HAND  Result Value Ref Range Status   Specimen Description BLOOD LEFT HAND  Final   Special Requests    Final    BOTTLES DRAWN AEROBIC AND ANAEROBIC Blood Culture results may not be optimal due to an inadequate volume of blood received in culture bottles   Culture   Final    NO GROWTH 1 DAY Performed at [facility], [address], [place], [place] [address]    Report Status PENDING  Incomplete     Radiology Studies: DG CHEST PORT 1 VIEW Result Date: 11/05/2023 CLINICAL DATA:  Follow-up congestive heart failure. EXAM: PORTABLE CHEST 1 VIEW COMPARISON:  Radiographs 11/03/2023 and 11/01/2023. FINDINGS: 0520 hours. The heart now appears mildly enlarged. There is aortic atherosclerosis with worsening pulmonary edema and enlarged bilateral pleural effusions. No evidence of pneumothorax or confluent airspace disease. No acute osseous findings are evident. Telemetry leads overlie the chest. IMPRESSION: Worsening congestive heart failure with pulmonary edema and enlarged bilateral pleural effusions. Electronically Signed   By: Carey Bullocks M.D.   On: 11/05/2023 09:42   ECHOCARDIOGRAM COMPLETE Result Date: 11/04/2023    ECHOCARDIOGRAM REPORT   Patient Name:   ANTHONELLA KLAUSNER [facility] Date of Exam: 11/04/2023 Medical Rec #:  [id number]        Height:       64.0 in Accession #:    [id number]       Weight:       149.9 lb Date of Birth:  December 16, 1945         BSA:          1.731 m Patient Age:    76 years  BP:           109/63 mmHg Patient Gender: F                HR:           - bpm. Exam Location:  Inpatient Procedure: 2D Echo, Cardiac Doppler and Color Doppler (Both Spectral and Color            Flow Doppler were utilized during procedure). Indications:    Shock  History:        Patient has prior history of Echocardiogram examinations, most                 recent 06/13/2021. CHF, Dementia; Risk Factors:Hypertension.  Sonographer:    Amy Chionchio Referring Phys: [id number] OMAR M ALBUSTAMI IMPRESSIONS  1. Difficult study due to the patient not cooperating with exam, full exam not performed. LV function is  hyperdynamic >75%. RV now well visualized. Incomplete evaluation of all cardiac valves.  2. Left ventricular ejection fraction, by estimation, is >75%. The left ventricle has hyperdynamic function. The left ventricle has no regional wall motion abnormalities. There is mild asymmetric left ventricular hypertrophy of the basal-septal segment.  Left ventricular diastolic function could not be evaluated.  3. Right ventricular systolic function was not well visualized. The right ventricular size is not well visualized.  4. The mitral valve is degenerative. Severe mitral annular calcification.  5. The aortic valve was not well visualized. Comparison(s): No significant change from prior study. No change in LV function. Again, the patient did not cooperate with the exam, so further comparison cannot be performed. FINDINGS  Left Ventricle: Left ventricular ejection fraction, by estimation, is >75%. The left ventricle has hyperdynamic function. The left ventricle has no regional wall motion abnormalities. Strain imaging was not performed. The left ventricular internal cavity size was small. There is mild asymmetric left ventricular hypertrophy of the basal-septal segment. Left ventricular diastolic function could not be evaluated. Right Ventricle: The right ventricular size is not well visualized. Right vetricular wall thickness was not well visualized. Right ventricular systolic function was not well visualized. Left Atrium: Left atrial size was not assessed. Right Atrium: Right atrial size was not assessed. Pericardium: There is no evidence of pericardial effusion. Mitral Valve: The mitral valve is degenerative in appearance. Severe mitral annular calcification. Tricuspid Valve: The tricuspid valve is grossly normal. Tricuspid valve regurgitation is trivial. No evidence of tricuspid stenosis. Aortic Valve: The aortic valve was not well visualized. Pulmonic Valve: The pulmonic valve was grossly normal. Pulmonic valve  regurgitation is trivial. No evidence of pulmonic stenosis. Aorta: The aortic root was not well visualized. Additional Comments: 3D imaging was not performed. There is a small pleural effusion in the left lateral region.  LEFT VENTRICLE PLAX 2D LVIDd:         2.80 cm LVIDs:         1.70 cm LV PW:         1.00 cm LV IVS:        1.30 cm LVOT diam:     1.90 cm LVOT Area:     2.84 cm  PULMONIC VALVE PV Vmax:          1.25 m/s PV Peak grad:     6.2 mmHg PR End Diast Vel: 18.66 msec   SHUNTS Systemic Diam: 1.90 cm Lennie Odor MD Electronically signed by Lennie Odor MD Signature Date/Time: 11/04/2023/1:42:10 PM    Final    DG Chest [facility]  Result Date: 11/03/2023 CLINICAL DATA:  Dyspnea EXAM: PORTABLE CHEST 1 VIEW COMPARISON:  10/31/2013 FINDINGS: Moderate bilateral pleural effusions are present, enlarged since prior examination. Central pulmonary vascular congestion and superimposed perihilar interstitial pulmonary edema appears progressive since prior examination. No pneumothorax. Cardiac size is within normal limits. No acute bone abnormality. IMPRESSION: 1. Progressive congestive heart failure. Electronically Signed   By: Helyn Numbers M.D.   On: 11/03/2023 23:57     Scheduled Meds:  aspirin  325 mg Oral Daily   busPIRone  5 mg Oral TID   Chlorhexidine Gluconate Cloth  6 each Topical Daily   donepezil  5 mg Oral QHS   enoxaparin (LOVENOX) injection  40 mg Subcutaneous Q24H   escitalopram  20 mg Oral q morning   furosemide  40 mg Intravenous BID   melatonin  3 mg Oral QHS   memantine  10 mg Oral BID   metoprolol tartrate  25 mg Oral BID   multivitamin with minerals  1 tablet Oral Daily   potassium chloride  40 mEq Oral BID   senna-docusate  1 tablet Oral BID   Continuous Infusions:  meropenem (MERREM) IV Stopped (11/05/23 0435)      LOS: 4 days    Time spent:    Zannie Cove, MD [facility]   11/05/2023, 9:56 AM

## 2023-11-05 NOTE — Evaluation (Signed)
Occupational Therapy Evaluation Patient Details Name: Christine Christensen MRN: [id number] DOB: 1945-10-12 Today's Date: 11/05/2023   History of Present Illness   78 y.o. female presents to [facility] hospital on 11/01/2023 with cough and fever, found to have fluA. Pt recently hospitalized for UTI and discharged to SNF. PMH includes dementia, CVA with RLE weakness, HTN.     Clinical Impressions Pt from SNF, reports using w/c at baseline for mobility, reports she can typically transfer to w/c without assist. Pt has assist for ADLs. Currently needing min-mod A for ADLs, and supervision for rolling at bed level, EOB/OOB mobility deferred at this time 2/2 elevated HR ranging 110-150 during session with bed level AROM/ADLs. Pt presenting with impairments listed below, will follow acutely. Patient will benefit from continued inpatient follow up therapy, <3 hours/day to maximize safety/ind with ADL/functional mobility.      If plan is discharge home, recommend the following:   A little help with walking and/or transfers;A little help with bathing/dressing/bathroom;Assistance with cooking/housework;Direct supervision/assist for medications management;Direct supervision/assist for financial management;Help with stairs or ramp for entrance;Assist for transportation     Functional Status Assessment   Patient has had a recent decline in their functional status and demonstrates the ability to make significant improvements in function in a reasonable and predictable amount of time.     Equipment Recommendations   Other (comment) (defer)     Recommendations for Other Services   PT consult     Precautions/Restrictions   Precautions Precautions: Fall Recall of Precautions/Restrictions: Impaired Restrictions Weight Bearing Restrictions Per Provider Order: No     Mobility Bed Mobility Overal bed mobility: Needs Assistance Bed Mobility: Rolling Rolling: Supervision         General bed  mobility comments: deferred EOB 2/2 HR    Transfers                   General transfer comment: deferred 2/2 HR      Balance       Sitting balance - Comments: unable to assess       Standing balance comment: unable to assess                           ADL either performed or assessed with clinical judgement   ADL Overall ADL's : Needs assistance/impaired Eating/Feeding: Set up;Sitting;Bed level Eating/Feeding Details (indicate cue type and reason): drinking coffee Grooming: Sitting;Contact guard assist;Bed level   Upper Body Bathing: Moderate assistance;Sitting;Bed level   Lower Body Bathing: Moderate assistance;Bed level   Upper Body Dressing : Moderate assistance;Sitting;Bed level   Lower Body Dressing: Moderate assistance;Sitting/lateral leans;Bed level   Toilet Transfer: Minimal assistance   Toileting- Clothing Manipulation and Hygiene: Minimal assistance;Sitting/lateral lean       Functional mobility during ADLs: Minimal assistance;Rolling walker (2 wheels);Moderate assistance       Vision   Vision Assessment?: No apparent visual deficits     Perception Perception: Not tested       Praxis Praxis: Not tested       Pertinent Vitals/Pain Pain Assessment Pain Assessment: Faces Pain Score: 6  Faces Pain Scale: Hurts even more Pain Location: bottom and R knee Pain Descriptors / Indicators: Grimacing Pain Intervention(s): Limited activity within patient's tolerance, Monitored during session, Repositioned     Extremity/Trunk Assessment Upper Extremity Assessment Upper Extremity Assessment: Generalized weakness RUE Deficits / Details: grossly 4-/5 RUE Sensation: WNL RUE Coordination: decreased fine motor   Lower Extremity Assessment  Lower Extremity Assessment: Defer to PT evaluation   Cervical / Trunk Assessment Cervical / Trunk Assessment: Normal   Communication Communication Communication: No apparent difficulties    Cognition Arousal: Alert Behavior During Therapy: WFL for tasks assessed/performed Cognition: History of cognitive impairments             OT - Cognition Comments: hx of dementia, follows commands with incr time, oriented to self and date of birth, aware she is from SNF                 Following commands: Intact       Cueing  General Comments   Cueing Techniques: Verbal cues      Exercises     Shoulder Instructions      Home Living Family/patient expects to be discharged to:: Skilled nursing facility                                 Additional Comments: pt resides at [facility] in LTC      Prior Functioning/Environment               Mobility Comments: w/c baseline, transfers to w/c independently ADLs Comments: reports assist from SNF staff    OT Problem List: Decreased strength;Decreased range of motion;Decreased activity tolerance;Impaired balance (sitting and/or standing);Decreased safety awareness;Decreased knowledge of use of DME or AE;Decreased knowledge of precautions   OT Treatment/Interventions:        OT Goals(Current goals can be found in the care plan section)   Acute Rehab OT Goals Patient Stated Goal: none stated OT Goal Formulation: With patient Time For Goal Achievement: 11/20/23 Potential to Achieve Goals: Good ADL Goals Pt Will Perform Upper Body Dressing: sitting;with min assist Pt Will Perform Lower Body Dressing: sit to/from stand;sitting/lateral leans;with min assist Pt Will Transfer to Toilet: with supervision;stand pivot transfer;squat pivot transfer;bedside commode Additional ADL Goal #1: pt will perform bed mobility min A in prep for ADLs   OT Frequency:       Co-evaluation              AM-PAC OT "6 Clicks" Daily Activity     Outcome Measure Help from another person eating meals?: A Little Help from another person taking care of personal grooming?: A Little Help from another person  toileting, which includes using toliet, bedpan, or urinal?: A Lot Help from another person bathing (including washing, rinsing, drying)?: A Lot Help from another person to put on and taking off regular upper body clothing?: A Lot Help from another person to put on and taking off regular lower body clothing?: A Lot 6 Click Score: 14   End of Session Nurse Communication: Mobility status  Activity Tolerance: Patient tolerated treatment well Patient left: in bed;with call bell/phone within reach;with bed alarm set  OT Visit Diagnosis: Unsteadiness on feet (R26.81);Other abnormalities of gait and mobility (R26.89);Muscle weakness (generalized) (M62.81)                Time: 2956-2130 OT Time Calculation (min): 22 min Charges:  OT General Charges $OT Visit: 1 Visit OT Evaluation $OT Eval Low Complexity: 1 Low  Aleister Lady K, OTD, OTR/L SecureChat Preferred Acute Rehab [phone]   [name] 11/05/2023, 8:54 AM

## 2023-11-05 NOTE — Progress Notes (Signed)
Initial Nutrition Assessment  DOCUMENTATION CODES:   Not applicable  INTERVENTION:   Ensure Enlive po TID, each supplement provides 350 kcal and 20 grams of protein.  Continue liberalized diet  NUTRITION DIAGNOSIS:   Inadequate oral intake related to poor appetite as evidenced by per patient/family report, meal completion < 50%.  Being addressed via TF  GOAL:   Patient will meet greater than or equal to 90% of their needs  Progressing  MONITOR:   PO intake, Supplement acceptance, Labs, Weight trends, Skin  REASON FOR ASSESSMENT:   Consult Assessment of nutrition requirement/status, Calorie Count  ASSESSMENT:   78 yo female admitted with sepsis, multifocal pneumonia, acute on chronic CHF with mitral stenosis, pulmonary HTN, +Flu A. Pt with dementia at baseline. PMH includes CVA with RLE weakness, malnutrition, dementia, HTN, severe mitral stenosis, pulmonary HTN  Pt not available at time of visit and unable to obtain nutrition history at this time  Recorded po intake 0-75% of meals; 28% on average  Current wt 68 kg and from day of admission. Question if admit weight measured or stated. Request new measured weight.   Labs: reviewed, electrolytes wdl. Serum glucose wdl, not checking CBGs Meds: lasix, MVI with Minerals, KCl, senna-docusate  NUTRITION - FOCUSED PHYSICAL EXAM:  Unable to assess  Diet Order:   Diet Order             DIET DYS 3 Room service appropriate? Yes; Fluid consistency: Thin  Diet effective now                   EDUCATION NEEDS:   Not appropriate for education at this time  Skin:  Skin Assessment: Skin Integrity Issues: Skin Integrity Issues:: Stage II Stage II: buttocks  Last BM:  2/17  Height:   Ht Readings from Last 1 Encounters:  11/01/23 5\' 4"  (1.626 m)    Weight:   Wt Readings from Last 1 Encounters:  11/01/23 68 kg    BMI:  Body mass index is 25.73 kg/m.  Estimated Nutritional Needs:   Kcal:  1550-1750  kcals  Protein:  70-80 g  Fluid:  >/= 1.6 L   Romelle Starcher MS, RDN, LDN, CNSC Registered Dietitian 3 Clinical Nutrition RD Inpatient Contact Info in Amion

## 2023-11-06 DIAGNOSIS — J189 Pneumonia, unspecified organism: Secondary | ICD-10-CM | POA: Diagnosis not present

## 2023-11-06 LAB — CULTURE, BLOOD (ROUTINE X 2)
Culture: NO GROWTH
Culture: NO GROWTH
Special Requests: ADEQUATE

## 2023-11-06 LAB — COMPREHENSIVE METABOLIC PANEL
ALT: 10 U/L (ref 0–44)
AST: 23 U/L (ref 15–41)
Albumin: 2.4 g/dL — ABNORMAL LOW (ref 3.5–5.0)
Alkaline Phosphatase: 57 U/L (ref 38–126)
Anion gap: 11 (ref 5–15)
BUN: 11 mg/dL (ref 8–23)
CO2: 28 mmol/L (ref 22–32)
Calcium: 8.8 mg/dL — ABNORMAL LOW (ref 8.9–10.3)
Chloride: 101 mmol/L (ref 98–111)
Creatinine, Ser: 0.82 mg/dL (ref 0.44–1.00)
GFR, Estimated: >60 mL/min (ref >60)
Glucose, Bld: 98 mg/dL (ref 70–99)
Potassium: 3.8 mmol/L (ref 3.5–5.1)
Sodium: 140 mmol/L (ref 135–145)
Total Bilirubin: 0.4 mg/dL (ref 0.0–1.2)
Total Protein: 6 g/dL — ABNORMAL LOW (ref 6.5–8.1)

## 2023-11-06 LAB — CBC
HCT: 31.7 % — ABNORMAL LOW (ref 36.0–46.0)
Hemoglobin: 10.6 g/dL — ABNORMAL LOW (ref 12.0–15.0)
MCH: 28.6 pg (ref 26.0–34.0)
MCHC: 33.4 g/dL (ref 30.0–36.0)
MCV: 85.7 fL (ref 80.0–100.0)
Platelets: 487 10*3/uL — ABNORMAL HIGH (ref 150–400)
RBC: 3.7 MIL/uL — ABNORMAL LOW (ref 3.87–5.11)
RDW: 15.1 % (ref 11.5–15.5)
WBC: 8.7 10*3/uL (ref 4.0–10.5)
nRBC: 0.2 % (ref 0.0–0.2)

## 2023-11-06 LAB — CALCIUM, IONIZED: Calcium, Ionized, Serum: 4.9 mg/dL (ref 4.5–5.6)

## 2023-11-06 MED ORDER — SPIRONOLACTONE 25 MG PO TABS
25.0000 mg | ORAL_TABLET | Freq: Every day | ORAL | Status: DC
  Administered 2023-11-06 – 2023-11-08 (×3): 25 mg via ORAL
  Filled 2023-11-06 (×3): qty 1

## 2023-11-06 NOTE — Plan of Care (Signed)

## 2023-11-06 NOTE — Progress Notes (Signed)
Mobility Specialist: Progress Note   11/06/23 1632  Mobility  Activity Transferred from bed to chair  Level of Assistance Minimal assist, patient does 75% or more  Assistive Device Other (Comment) (HHA)  Activity Response Tolerated well  Mobility Referral Yes  Mobility visit 1 Mobility  Mobility Specialist Start Time (ACUTE ONLY) 1430  Mobility Specialist Stop Time (ACUTE ONLY) 1443  Mobility Specialist Time Calculation (min) (ACUTE ONLY) 13 min    Pt was agreeable to mobility session - received in bed. SV for bed mobility, minA for STS and stand pivot to chair. No complaints. Left in chair with all needs met, call bell in reach.   Maurene Capes Mobility Specialist Please contact via SecureChat or Rehab office at [phone]

## 2023-11-06 NOTE — Progress Notes (Addendum)
PROGRESS NOTE    Christine MADIA  Christensen:[id number] DOB: [birth year] DOA: 11/01/2023 PCP: Fleet Contras, MD  78/F with vascular dementia, CVA, mitral stenosis, chronic right lower extremity weakness, hypertension was recently hospitalized at [facility] for aphasia, cognitive deficits, eventually diagnosed with E. coli UTI and discharged back to SNF on 1/26, ..back in the ER 2/7 with cough and fever, diagnosed with influenza A, discharged back.  -Back in the ER 2/13  with cough, fever, shortness of breath. In ED Tachycardic, mildly hypoxic, labs noted WBC of 17.9, creatinine 0.8, troponin 7, lactic acid 2.0, CXR with persistent bibasilar opacities -Admitted, improving on antibiotics -2/15 overnight became hypotensive and transferred to ICU> stabilized with IV fluids, did not need pressors -2/16, fluids discontinued, started on IV Lasix, echo was poor study, will need repeat to better assess mitral valve  Subjective: -Feels better, no events overnight  Assessment and Plan:  Sepsis,  Multifocal pneumonia -Presenting with fever, cough, tachycardia and leukocytosis, x-ray with bibasilar opacities -Suspected to be secondary infection following influenza A versus aspiration -was improving, changed to cefuroxime 2/15 overnight became hypotensive and antibiotics changed to meropenem per PCCM, x-ray is more suggestive of fluid overload, day 6 of ABX, will discontinue today -Blood cultures negative thus far,  -SLP eval completed.,  No overt aspiration noted -Increase activity, PT OT  Transient hypotension -Improved -Likely multifactorial, has known mitral stenosis, repeat echo  H/o Mitral stenosis Acute on chronic diastolic CHF Pulmonary hypertension -Echo 2/16 with hyperdynamic LVEF> 75%, poor study, valves could not be well assessed, will repeat echo today -Continue IV Lasix 1 more day, changed to torsemide tomorrow, albumin is 2.3, continue Aldactone -With her dementia and significant debility I do  not think she would be a candidate for intervention if severe mitral stenosis is confirmed -Unable to reach her DSS guardian, will request TOC assistance   Influenza A Completed Tamiflu course  Severe hypoalbuminemia -Protein calorie malnutrition -Dietitian consulted   Sinus tachycardia, PACs -Worsened in the setting of above, continue Toprol, improving -Discontinue Xopenex   Dementia Anxiety, insomnia -Continue home regimen of Aricept, Namenda, BuSpar and Lexapro   History of CVA -Continue aspirin   History of UTIs -Just completed a course of antibiotics for E. Coli -UA unremarkable     DVT prophylaxis: lovenox Code Status: Full Code family Communication: No family at bedside, she is a guardian of the state, left message for guardian DSS 2/17 and 2/18, finally able to reach her new CSW Auberry at [phone], recommended DNR, she suggests filling out a form and sending over Disposition Plan: Likely back to SNF once stable  Consultants:    Procedures:   Antimicrobials:    Objective: Vitals:   11/06/23 0037 11/06/23 0453 11/06/23 0838 11/06/23 0929  BP: (!) 92/56 128/73 116/82 119/83  Pulse: 71 83 90 (!) 109  Resp: 20 20 16    Temp: 98.7 F (37.1 C) 98.6 F (37 C) 98.8 F (37.1 C)   TempSrc: Oral Oral Oral   SpO2: 100% 100% 94% 95%  Weight:  60.7 kg    Height:        Intake/Output Summary (Last 24 hours) at 11/06/2023 1026 Last data filed at 11/06/2023 0326 Gross per 24 hour  Intake 299.87 ml  Output 600 ml  Net -300.13 ml   Filed Weights   11/01/23 1217 11/06/23 0453  Weight: 68 kg 60.7 kg    Examination:  General exam: Chronically ill cachectic female laying in bed, AAOx3 HEENT: Positive JVD CVS: S1-S2,  regular rhythm, diastolic murmur, tachycardic Lungs: clear Abdomen: Soft, nontender, bowel sounds present Extremities: No edema  neuro: Chronic right leg weakness  Skin: No rashes Psychiatry: Flat affect, poor insight and  judgment    Data Reviewed:   CBC: Recent Labs  Lab 11/01/23 1235 11/01/23 1243 11/02/23 0722 11/03/23 0259 11/04/23 0348 11/04/23 0910 11/05/23 0422 11/06/23 0636  WBC 17.9*   < > 11.0* 10.4 9.8  --  8.7 8.7  NEUTROABS 15.5*  --   --   --   --   --   --   --   HGB 11.3*   < > 9.0* 9.3* 9.6* 9.5* 10.1* 10.6*  HCT 33.5*   < > 27.7* 28.8* 29.0* 28.0* 30.5* 31.7*  MCV 86.8   < > 88.2 88.1 89.0  --  87.1 85.7  PLT 219   < > 216 268 321  --  403* 487*   < > = values in this interval not displayed.   Basic Metabolic Panel: Recent Labs  Lab 11/03/23 0259 11/04/23 0348 11/04/23 0910 11/04/23 2024 11/05/23 0422 11/06/23 0636  NA 140 141 143 142 140 140  K 3.6 3.5 3.5 3.2* 3.5 3.8  CL 106 108  --  105 103 101  CO2 25 22  --  26 26 28   GLUCOSE 90 95  --  94 90 98  BUN 9 14  --  9 8 11   CREATININE 0.84 0.90  --  0.83 0.75 0.82  CALCIUM 8.3* 8.0*  --  8.4* 8.7* 8.8*  MG  --  1.9  --  1.9  --   --   PHOS  --  3.0  --   --   --   --    GFR: Estimated Creatinine Clearance: 48.8 mL/min (by C-G formula based on SCr of 0.82 mg/dL). Liver Function Tests: Recent Labs  Lab 11/01/23 1235 11/02/23 0722 11/04/23 0348 11/06/23 0636  AST 21 15 14* 23  ALT 16 12 12 10   ALKPHOS 74 58 54 57  BILITOT 1.0 0.7 0.3 0.4  PROT 6.8 5.1* 5.8* 6.0*  ALBUMIN 2.7* 2.0* 2.3* 2.4*   No results for input(s): "LIPASE", "AMYLASE" in the last 168 hours. Recent Labs  Lab 11/04/23 0348  AMMONIA <10   Coagulation Profile: No results for input(s): "INR", "PROTIME" in the last 168 hours.  Cardiac Enzymes: No results for input(s): "CKTOTAL", "CKMB", "CKMBINDEX", "TROPONINI" in the last 168 hours. BNP (last 3 results) No results for input(s): "PROBNP" in the last 8760 hours. HbA1C: No results for input(s): "HGBA1C" in the last 72 hours. CBG: Recent Labs  Lab 11/04/23 0037 11/04/23 0238  GLUCAP 98 79   Lipid Profile: No results for input(s): "CHOL", "HDL", "LDLCALC", "TRIG", "CHOLHDL",  "LDLDIRECT" in the last 72 hours. Thyroid Function Tests: Recent Labs    11/04/23 0348 11/04/23 0352  TSH 0.763  --   FREET4  --  1.10   Anemia Panel: No results for input(s): "VITAMINB12", "FOLATE", "FERRITIN", "TIBC", "IRON", "RETICCTPCT" in the last 72 hours. Urine analysis:    Component Value Date/Time   COLORURINE AMBER (A) 11/01/2023 2243   APPEARANCEUR CLEAR 11/01/2023 2243   LABSPEC 1.017 11/01/2023 2243   PHURINE 5.0 11/01/2023 2243   GLUCOSEU NEGATIVE 11/01/2023 2243   HGBUR NEGATIVE 11/01/2023 2243   BILIRUBINUR NEGATIVE 11/01/2023 2243   KETONESUR NEGATIVE 11/01/2023 2243   PROTEINUR NEGATIVE 11/01/2023 2243   NITRITE NEGATIVE 11/01/2023 2243   LEUKOCYTESUR NEGATIVE 11/01/2023 2243   Sepsis Labs: @  LABRCNTIP(procalcitonin:4,lacticidven:4)  ) Recent Results (from the past 240 hours)  Culture, blood (routine x 2)     Status: None   Collection Time: 11/01/23 12:13 PM   Specimen: BLOOD  Result Value Ref Range Status   Specimen Description BLOOD RIGHT ANTECUBITAL  Final   Special Requests   Final    BOTTLES DRAWN AEROBIC AND ANAEROBIC Blood Culture adequate volume   Culture   Final    NO GROWTH 5 DAYS Performed at [facility], [address], [place], [place] [address]    Report Status 11/06/2023 FINAL  Final  Culture, blood (routine x 2)     Status: None   Collection Time: 11/01/23 12:18 PM   Specimen: BLOOD LEFT FOREARM  Result Value Ref Range Status   Specimen Description BLOOD LEFT FOREARM  Final   Special Requests   Final    BOTTLES DRAWN AEROBIC AND ANAEROBIC Blood Culture results may not be optimal due to an inadequate volume of blood received in culture bottles   Culture   Final    NO GROWTH 5 DAYS Performed at [facility], [address], [place], [place] [address]    Report Status 11/06/2023 FINAL  Final  MRSA Next Gen by PCR, Nasal     Status: None   Collection Time: 11/02/23  1:41 AM   Specimen: Nasal Mucosa; Nasal Swab  Result  Value Ref Range Status   MRSA by PCR Next Gen NOT DETECTED NOT DETECTED Final    Comment: (NOTE) The GeneXpert MRSA Assay (FDA approved for NASAL specimens only), is one component of a comprehensive MRSA colonization surveillance program. It is not intended to diagnose MRSA infection nor to guide or monitor treatment for MRSA infections. Test performance is not FDA approved in patients less than 63 years old. Performed at [facility], [address], [place], [place] [address]   Culture, blood (Routine X 2) w Reflex to ID Panel     Status: None (Preliminary result)   Collection Time: 11/04/23  3:47 AM   Specimen: BLOOD RIGHT HAND  Result Value Ref Range Status   Specimen Description BLOOD RIGHT HAND  Final   Special Requests   Final    BOTTLES DRAWN AEROBIC AND ANAEROBIC Blood Culture results may not be optimal due to an inadequate volume of blood received in culture bottles   Culture   Final    NO GROWTH 2 DAYS Performed at [facility], [address], [place], [place] [address]    Report Status PENDING  Incomplete  Culture, blood (Routine X 2) w Reflex to ID Panel     Status: None (Preliminary result)   Collection Time: 11/04/23  3:47 AM   Specimen: BLOOD LEFT HAND  Result Value Ref Range Status   Specimen Description BLOOD LEFT HAND  Final   Special Requests   Final    BOTTLES DRAWN AEROBIC AND ANAEROBIC Blood Culture results may not be optimal due to an inadequate volume of blood received in culture bottles   Culture   Final    NO GROWTH 2 DAYS Performed at [facility], [address], [place], [place] [address]    Report Status PENDING  Incomplete     Radiology Studies: DG CHEST PORT 1 VIEW Result Date: 11/05/2023 CLINICAL DATA:  Follow-up congestive heart failure. EXAM: PORTABLE CHEST 1 VIEW COMPARISON:  Radiographs 11/03/2023 and 11/01/2023. FINDINGS: 0520 hours. The heart now appears mildly enlarged. There is aortic  atherosclerosis with worsening  pulmonary edema and enlarged bilateral pleural effusions. No evidence of pneumothorax or confluent airspace disease. No acute osseous findings are evident. Telemetry leads overlie the chest. IMPRESSION: Worsening congestive heart failure with pulmonary edema and enlarged bilateral pleural effusions. Electronically Signed   By: Carey Bullocks M.D.   On: 11/05/2023 09:42   ECHOCARDIOGRAM COMPLETE Result Date: 11/04/2023    ECHOCARDIOGRAM REPORT   Patient Name:   EMILEY DIGIACOMO [facility] Date of Exam: 11/04/2023 Medical Rec #:  [id number]        Height:       64.0 in Accession #:    [id number]       Weight:       149.9 lb Date of Birth:  05-13-1946         BSA:          1.731 m Patient Age:    78 years         BP:           109/63 mmHg Patient Gender: F                HR:           - bpm. Exam Location:  Inpatient Procedure: 2D Echo, Cardiac Doppler and Color Doppler (Both Spectral and Color            Flow Doppler were utilized during procedure). Indications:    Shock  History:        Patient has prior history of Echocardiogram examinations, most                 recent 06/13/2021. CHF, Dementia; Risk Factors:Hypertension.  Sonographer:    Amy Chionchio Referring Phys: [id number] OMAR M ALBUSTAMI IMPRESSIONS  1. Difficult study due to the patient not cooperating with exam, full exam not performed. LV function is hyperdynamic >75%. RV now well visualized. Incomplete evaluation of all cardiac valves.  2. Left ventricular ejection fraction, by estimation, is >75%. The left ventricle has hyperdynamic function. The left ventricle has no regional wall motion abnormalities. There is mild asymmetric left ventricular hypertrophy of the basal-septal segment.  Left ventricular diastolic function could not be evaluated.  3. Right ventricular systolic function was not well visualized. The right ventricular size is not well visualized.  4. The mitral valve is degenerative. Severe mitral annular calcification.  5. The aortic valve was not well  visualized. Comparison(s): No significant change from prior study. No change in LV function. Again, the patient did not cooperate with the exam, so further comparison cannot be performed. FINDINGS  Left Ventricle: Left ventricular ejection fraction, by estimation, is >75%. The left ventricle has hyperdynamic function. The left ventricle has no regional wall motion abnormalities. Strain imaging was not performed. The left ventricular internal cavity size was small. There is mild asymmetric left ventricular hypertrophy of the basal-septal segment. Left ventricular diastolic function could not be evaluated. Right Ventricle: The right ventricular size is not well visualized. Right vetricular wall thickness was not well visualized. Right ventricular systolic function was not well visualized. Left Atrium: Left atrial size was not assessed. Right Atrium: Right atrial size was not assessed. Pericardium: There is no evidence of pericardial effusion. Mitral Valve: The mitral valve is degenerative in appearance. Severe mitral annular calcification. Tricuspid Valve: The tricuspid valve is grossly normal. Tricuspid valve regurgitation is trivial. No evidence of tricuspid stenosis. Aortic Valve: The aortic valve was not well visualized. Pulmonic Valve: The pulmonic valve was grossly normal. Pulmonic valve  regurgitation is trivial. No evidence of pulmonic stenosis. Aorta: The aortic root was not well visualized. Additional Comments: 3D imaging was not performed. There is a small pleural effusion in the left lateral region.  LEFT VENTRICLE PLAX 2D LVIDd:         2.80 cm LVIDs:         1.70 cm LV PW:         1.00 cm LV IVS:        1.30 cm LVOT diam:     1.90 cm LVOT Area:     2.84 cm  PULMONIC VALVE PV Vmax:          1.25 m/s PV Peak grad:     6.2 mmHg PR End Diast Vel: 18.66 msec   SHUNTS Systemic Diam: 1.90 cm Lennie Odor MD Electronically signed by Lennie Odor MD Signature Date/Time: 11/04/2023/1:42:10 PM    Final       Scheduled Meds:  aspirin  325 mg Oral Daily   busPIRone  5 mg Oral TID   Chlorhexidine Gluconate Cloth  6 each Topical Daily   donepezil  5 mg Oral QHS   enoxaparin (LOVENOX) injection  40 mg Subcutaneous Q24H   escitalopram  20 mg Oral q morning   feeding supplement  237 mL Oral TID BM   furosemide  40 mg Intravenous BID   melatonin  3 mg Oral QHS   memantine  10 mg Oral BID   metoprolol tartrate  25 mg Oral BID   multivitamin with minerals  1 tablet Oral Daily   senna-docusate  1 tablet Oral BID   Continuous Infusions:      LOS: 5 days    Time spent:    Zannie Cove, MD [facility]   11/06/2023, 10:26 AM

## 2023-11-06 NOTE — Progress Notes (Signed)
Physical Therapy Treatment Patient Details Name: Christine Christensen MRN: [id number] DOB: 1946/03/12 Today's Date: 11/06/2023   History of Present Illness 78 y.o. female presents to [facility] hospital on 11/01/2023 with cough and fever, found to have fluA. Pt recently hospitalized for UTI and discharged to SNF. PMH includes dementia, CVA with RLE weakness, HTN.    PT Comments  Patient agreed to PT interventions to improve her strength. Initiated session with LE exercises (AAROM). As began to move to sit EOB, noted pt incontinent of bowels and nursing notified in order to take care of her. Review of record noted pt is a resident of [facility] (pt in LTC PTA). Reviewed prior therapy notes and appears pt is close to or at her baseline (requires assist for transfers; using a wheelchair for mobility). Due to pt without functional decline, will discharge from PT.     If plan is discharge home, recommend the following: A lot of help with walking and/or transfers;A lot of help with bathing/dressing/bathroom;Assistance with cooking/housework;Assist for transportation;Help with stairs or ramp for entrance;Supervision due to cognitive status   Can travel by private vehicle     Yes  Equipment Recommendations  None recommended by PT    Recommendations for Other Services       Precautions / Restrictions Precautions Precautions: Fall Recall of Precautions/Restrictions: Impaired Restrictions Weight Bearing Restrictions Per Provider Order: No     Mobility  Bed Mobility Overal bed mobility: Needs Assistance Bed Mobility: Rolling Rolling: Supervision         General bed mobility comments: As rolled to initiate OOB, noted pt with large BM. Nursing contacted to attend to pt with further mobility deferred    Transfers                        Ambulation/Gait                   Stairs             Wheelchair Mobility     Tilt Bed    Modified Rankin (Stroke Patients Only)        Balance                                            Communication Communication Communication: No apparent difficulties  Cognition Arousal: Alert Behavior During Therapy: Flat affect   PT - Cognitive impairments: History of cognitive impairments                       PT - Cognition Comments: dementia in medical history. pt is alert and oriented to person Following commands: Intact      Cueing Cueing Techniques: Verbal cues  Exercises General Exercises - Lower Extremity Ankle Circles/Pumps: AAROM, Both, 10 reps, Supine Heel Slides: AAROM, Strengthening, Both, 5 reps (resisted extension) Hip ABduction/ADduction: AAROM, Both, 5 reps    General Comments        Pertinent Vitals/Pain Pain Assessment Pain Assessment: Faces Faces Pain Scale: No hurt    Home Living                          Prior Function            PT Goals (current goals can now be found in the care plan section) Acute Rehab PT  Goals Patient Stated Goal: to improve endurance Time For Goal Achievement: 11/17/23 Potential to Achieve Goals: Fair Progress towards PT goals:  (Discharge; pt at baseline; return to LTC)    Frequency    Min 1X/week      PT Plan      Co-evaluation              AM-PAC PT "6 Clicks" Mobility   Outcome Measure  Help needed turning from your back to your side while in a flat bed without using bedrails?: A Little Help needed moving from lying on your back to sitting on the side of a flat bed without using bedrails?: A Little Help needed moving to and from a bed to a chair (including a wheelchair)?: A Lot Help needed standing up from a chair using your arms (e.g., wheelchair or bedside chair)?: A Lot Help needed to walk in hospital room?: A Lot Help needed climbing 3-5 steps with a railing? : Total 6 Click Score: 13    End of Session   Activity Tolerance: Patient tolerated treatment well Patient left: in bed;with call  bell/phone within reach;with bed alarm set Nurse Communication: Mobility status;Other (comment) (needs to be cleaned up) PT Visit Diagnosis: Other abnormalities of gait and mobility (R26.89);Muscle weakness (generalized) (M62.81)   Patient is being discharged from PT services secondary to:  Patient found to be at her baseline; no decline therefore no skilled PT required.  Please see latest Therapy Progress Note for current level of functioning and progress toward goals.  Progress and discharge plan and discussed with patient/caregiver and they   Patient unable to participate in discharge planning    Time: 1345-1357 PT Time Calculation (min) (ACUTE ONLY): 12 min  Charges:    $Therapeutic Exercise: 8-22 mins PT General Charges $$ ACUTE PT VISIT: 1 Visit                      Jerolyn Center, PT Acute Rehabilitation Services  Office [phone]    Zena Amos 11/06/2023, 2:11 PM

## 2023-11-06 NOTE — Plan of Care (Signed)
  Problem: Education: Goal: Knowledge of General Education information will improve Description: Including pain rating scale, medication(s)/side effects and non-pharmacologic comfort measures Outcome: Progressing   Problem: Health Behavior/Discharge Planning: Goal: Ability to manage health-related needs will improve Outcome: Progressing   Problem: Clinical Measurements: Goal: Respiratory complications will improve Outcome: Progressing   Problem: Activity: Goal: Risk for activity intolerance will decrease Outcome: Progressing   Problem: Nutrition: Goal: Adequate nutrition will be maintained Outcome: Progressing   Problem: Coping: Goal: Level of anxiety will decrease Outcome: Progressing   Problem: Elimination: Goal: Will not experience complications related to urinary retention Outcome: Progressing

## 2023-11-06 NOTE — TOC Progression Note (Signed)
Transition of Care [facility]) - Progression Note    Patient Details  Name: LUETTA PIAZZA MRN: [id number] Date of Birth: [birth year]  Transition of [facility]) CM/SW Contact  Rasheema Truluck A [place], LCSW Phone Number: 11/06/2023, 1:19 PM  Clinical Narrative:     CSW contacted Sander Radon at [employer], she stated that pt's social worker Thayer Ohm no longer works at [employer] and has been assigned a new case worker, [name], [phone] and [phone] (mobile). She was informed of pt's admission to hospital and made aware of return back to [facility] when pt is stable for DC. Provider notified.    TOC will continue to follow.   Expected Discharge Plan: Skilled Nursing Facility Barriers to Discharge: Continued Medical Work up  Expected Discharge Plan and Services                                               Social Determinants of Health (SDOH) Interventions SDOH Screenings   Food Insecurity: No Food Insecurity (11/01/2023)  Housing: Low Risk  (11/01/2023)  Transportation Needs: No Transportation Needs (11/01/2023)  Utilities: Not At Risk (11/01/2023)  Social Connections: Patient Unable To Answer (11/01/2023)  Tobacco Use: Medium Risk (11/01/2023)    Readmission Risk Interventions     No data to display

## 2023-11-07 DIAGNOSIS — J189 Pneumonia, unspecified organism: Secondary | ICD-10-CM | POA: Diagnosis not present

## 2023-11-07 LAB — BASIC METABOLIC PANEL
Anion gap: 12 (ref 5–15)
BUN: 14 mg/dL (ref 8–23)
CO2: 29 mmol/L (ref 22–32)
Calcium: 9.2 mg/dL (ref 8.9–10.3)
Chloride: 99 mmol/L (ref 98–111)
Creatinine, Ser: 0.87 mg/dL (ref 0.44–1.00)
GFR, Estimated: >60 mL/min (ref >60)
Glucose, Bld: 93 mg/dL (ref 70–99)
Potassium: 3.6 mmol/L (ref 3.5–5.1)
Sodium: 140 mmol/L (ref 135–145)

## 2023-11-07 MED ORDER — TORSEMIDE 20 MG PO TABS
20.0000 mg | ORAL_TABLET | Freq: Every day | ORAL | Status: DC
  Administered 2023-11-07 – 2023-11-08 (×2): 20 mg via ORAL
  Filled 2023-11-07 (×2): qty 1

## 2023-11-07 NOTE — Progress Notes (Signed)
PROGRESS NOTE    Christine Christensen  WUX:[id number] DOB: 05-24-1946 DOA: 11/01/2023 PCP: Fleet Contras, MD  78/F with vascular dementia, CVA, mitral stenosis, chronic right lower extremity weakness, hypertension was recently hospitalized at [facility] for aphasia, cognitive deficits, eventually diagnosed with E. coli UTI and discharged back to SNF on 1/26, ..back in the ER 2/7 with cough and fever, diagnosed with influenza A, discharged back.  -Back in the ER 2/13  with cough, fever, shortness of breath. In ED Tachycardic, mildly hypoxic, labs noted WBC of 17.9, creatinine 0.8, troponin 7, lactic acid 2.0, CXR with persistent bibasilar opacities -Admitted, improving on antibiotics -2/15 overnight became hypotensive and transferred to ICU> stabilized with IV fluids, did not need pressors -2/16, fluids discontinued, started on IV Lasix, echo was poor study, will need repeat to better assess mitral valve  Subjective: -Echo did not get completed yesterday, feels okay overall, denies cough or dyspnea this morning  Assessment and Plan:  Sepsis,  Multifocal pneumonia -Presenting with fever, cough, tachycardia and leukocytosis, x-ray with bibasilar opacities -Suspected to be secondary infection following influenza A versus aspiration -completed 6 days of ABX -Blood cultures negative thus far,  -SLP eval completed.,  No overt aspiration noted -Increase activity, PT OT  Transient hypotension -Improved -Likely multifactorial, has known mitral stenosis, repeat echo pending  H/o severe mitral stenosis Acute on chronic diastolic CHF Pulmonary hypertension -Echo 2/16 with hyperdynamic LVEF> 75%, poor study, valves could not be well assessed, will repeat echo today -Volume status has improved, down 15 LB, changed to oral torsemide, continue Aldactone  -With her dementia and significant debility I do not think she would be a candidate for intervention if severe mitral stenosis is confirmed -Contacted DSS  guardian Cala Bradford yesterday and notified of such, recommended DNR, will send paperwork today   Influenza A Completed Tamiflu course  Severe hypoalbuminemia -Protein calorie malnutrition -Dietitian consulted   Sinus tachycardia, PACs -Worsened in the setting of above, continue Toprol, improving -Discontinue Xopenex   Dementia Anxiety, insomnia -Continue home regimen of Aricept, Namenda, BuSpar and Lexapro  Debility -Bed, wheelchair-bound at baseline   History of CVA -Continue aspirin   History of UTIs -Just completed a course of antibiotics for E. Coli -UA unremarkable     DVT prophylaxis: lovenox Code Status: Full Code family Communication: No family at bedside, she is a guardian of the state, left message for guardian DSS 2/17 and 2/18, finally able to reach her new CSW [place] at [phone], recommended DNR, she suggests filling out a form and sending over Disposition Plan: Back to SNF in 1 to 2 days  Consultants:    Procedures:   Antimicrobials:    Objective: Vitals:   11/07/23 0500 11/07/23 0616 11/07/23 0616 11/07/23 0846  BP:  113/88 113/88 90/69  Pulse:  100  71  Resp:  17 17 17   Temp:  98.7 F (37.1 C) 98.7 F (37.1 C) 98.6 F (37 C)  TempSrc:  Oral Oral Oral  SpO2:  95%  93%  Weight: 61.2 kg     Height:        Intake/Output Summary (Last 24 hours) at 11/07/2023 1024 Last data filed at 11/06/2023 2045 Gross per 24 hour  Intake 460 ml  Output --  Net 460 ml   Filed Weights   11/01/23 1217 11/06/23 0453 11/07/23 0500  Weight: 68 kg 60.7 kg 61.2 kg    Examination:  General exam: Chronically ill female laying in bed, AAO x 2, cognitive deficits noted HEENT:  No JVD CVS: S1-S2, regular rhythm, diastolic murmur Lungs: Decreased breath sounds at the bases otherwise clear Abdomen: Soft, nontender, bowel sounds present Extremities: No edema  neuro: Chronic right leg weakness  Skin: No rashes Psychiatry: Flat affect, poor insight and  judgment    Data Reviewed:   CBC: Recent Labs  Lab 11/01/23 1235 11/01/23 1243 11/02/23 0722 11/03/23 0259 11/04/23 0348 11/04/23 0910 11/05/23 0422 11/06/23 0636  WBC 17.9*   < > 11.0* 10.4 9.8  --  8.7 8.7  NEUTROABS 15.5*  --   --   --   --   --   --   --   HGB 11.3*   < > 9.0* 9.3* 9.6* 9.5* 10.1* 10.6*  HCT 33.5*   < > 27.7* 28.8* 29.0* 28.0* 30.5* 31.7*  MCV 86.8   < > 88.2 88.1 89.0  --  87.1 85.7  PLT 219   < > 216 268 321  --  403* 487*   < > = values in this interval not displayed.   Basic Metabolic Panel: Recent Labs  Lab 11/04/23 0348 11/04/23 0910 11/04/23 2024 11/05/23 0422 11/06/23 0636 11/07/23 0630  NA 141 143 142 140 140 140  K 3.5 3.5 3.2* 3.5 3.8 3.6  CL 108  --  105 103 101 99  CO2 22  --  26 26 28 29   GLUCOSE 95  --  94 90 98 93  BUN 14  --  9 8 11 14   CREATININE 0.90  --  0.83 0.75 0.82 0.87  CALCIUM 8.0*  --  8.4* 8.7* 8.8* 9.2  MG 1.9  --  1.9  --   --   --   PHOS 3.0  --   --   --   --   --    GFR: Estimated Creatinine Clearance: 46 mL/min (by C-G formula based on SCr of 0.87 mg/dL). Liver Function Tests: Recent Labs  Lab 11/01/23 1235 11/02/23 0722 11/04/23 0348 11/06/23 0636  AST 21 15 14* 23  ALT 16 12 12 10   ALKPHOS 74 58 54 57  BILITOT 1.0 0.7 0.3 0.4  PROT 6.8 5.1* 5.8* 6.0*  ALBUMIN 2.7* 2.0* 2.3* 2.4*   No results for input(s): "LIPASE", "AMYLASE" in the last 168 hours. Recent Labs  Lab 11/04/23 0348  AMMONIA <10   Coagulation Profile: No results for input(s): "INR", "PROTIME" in the last 168 hours.  Cardiac Enzymes: No results for input(s): "CKTOTAL", "CKMB", "CKMBINDEX", "TROPONINI" in the last 168 hours. BNP (last 3 results) No results for input(s): "PROBNP" in the last 8760 hours. HbA1C: No results for input(s): "HGBA1C" in the last 72 hours. CBG: Recent Labs  Lab 11/04/23 0037 11/04/23 0238  GLUCAP 98 79   Lipid Profile: No results for input(s): "CHOL", "HDL", "LDLCALC", "TRIG", "CHOLHDL",  "LDLDIRECT" in the last 72 hours. Thyroid Function Tests: No results for input(s): "TSH", "T4TOTAL", "FREET4", "T3FREE", "THYROIDAB" in the last 72 hours.  Anemia Panel: No results for input(s): "VITAMINB12", "FOLATE", "FERRITIN", "TIBC", "IRON", "RETICCTPCT" in the last 72 hours. Urine analysis:    Component Value Date/Time   COLORURINE AMBER (A) 11/01/2023 2243   APPEARANCEUR CLEAR 11/01/2023 2243   LABSPEC 1.017 11/01/2023 2243   PHURINE 5.0 11/01/2023 2243   GLUCOSEU NEGATIVE 11/01/2023 2243   HGBUR NEGATIVE 11/01/2023 2243   BILIRUBINUR NEGATIVE 11/01/2023 2243   KETONESUR NEGATIVE 11/01/2023 2243   PROTEINUR NEGATIVE 11/01/2023 2243   NITRITE NEGATIVE 11/01/2023 2243   LEUKOCYTESUR NEGATIVE 11/01/2023 2243  Sepsis Labs: @LABRCNTIP (procalcitonin:4,lacticidven:4)  ) Recent Results (from the past 240 hours)  Culture, blood (routine x 2)     Status: None   Collection Time: 11/01/23 12:13 PM   Specimen: BLOOD  Result Value Ref Range Status   Specimen Description BLOOD RIGHT ANTECUBITAL  Final   Special Requests   Final    BOTTLES DRAWN AEROBIC AND ANAEROBIC Blood Culture adequate volume   Culture   Final    NO GROWTH 5 DAYS Performed at [facility], [address], [place], [place] [address]    Report Status 11/06/2023 FINAL  Final  Culture, blood (routine x 2)     Status: None   Collection Time: 11/01/23 12:18 PM   Specimen: BLOOD LEFT FOREARM  Result Value Ref Range Status   Specimen Description BLOOD LEFT FOREARM  Final   Special Requests   Final    BOTTLES DRAWN AEROBIC AND ANAEROBIC Blood Culture results may not be optimal due to an inadequate volume of blood received in culture bottles   Culture   Final    NO GROWTH 5 DAYS Performed at [facility], [address] [place], [place] [address]    Report Status 11/06/2023 FINAL  Final  MRSA Next Gen by PCR, Nasal     Status: None   Collection Time: 11/02/23  1:41 AM   Specimen: Nasal Mucosa; Nasal  Swab  Result Value Ref Range Status   MRSA by PCR Next Gen NOT DETECTED NOT DETECTED Final    Comment: (NOTE) The GeneXpert MRSA Assay (FDA approved for NASAL specimens only), is one component of a comprehensive MRSA colonization surveillance program. It is not intended to diagnose MRSA infection nor to guide or monitor treatment for MRSA infections. Test performance is not FDA approved in patients less than 44 years old. Performed at [facility], [address] [place], [place] [address]   Culture, blood (Routine X 2) w Reflex to ID Panel     Status: None (Preliminary result)   Collection Time: 11/04/23  3:47 AM   Specimen: BLOOD RIGHT HAND  Result Value Ref Range Status   Specimen Description BLOOD RIGHT HAND  Final   Special Requests   Final    BOTTLES DRAWN AEROBIC AND ANAEROBIC Blood Culture results may not be optimal due to an inadequate volume of blood received in culture bottles   Culture   Final    NO GROWTH 3 DAYS Performed at [facility], [address], [place], [place] [address]    Report Status PENDING  Incomplete  Culture, blood (Routine X 2) w Reflex to ID Panel     Status: None (Preliminary result)   Collection Time: 11/04/23  3:47 AM   Specimen: BLOOD LEFT HAND  Result Value Ref Range Status   Specimen Description BLOOD LEFT HAND  Final   Special Requests   Final    BOTTLES DRAWN AEROBIC AND ANAEROBIC Blood Culture results may not be optimal due to an inadequate volume of blood received in culture bottles   Culture   Final    NO GROWTH 3 DAYS Performed at [facility], [address] [place], [place] [address]    Report Status PENDING  Incomplete     Radiology Studies: No results found.    Scheduled Meds:  aspirin  325 mg Oral Daily   busPIRone  5 mg Oral TID   Chlorhexidine Gluconate Cloth  6 each Topical Daily   donepezil  5 mg Oral QHS   enoxaparin (LOVENOX) injection  40 mg Subcutaneous Q24H   escitalopram  20 mg Oral q  morning   feeding supplement  237 mL Oral TID BM   melatonin  3 mg Oral QHS   memantine  10 mg Oral BID   metoprolol tartrate  25 mg Oral BID   multivitamin with minerals  1 tablet Oral Daily   senna-docusate  1 tablet Oral BID   spironolactone  25 mg Oral Daily   torsemide  20 mg Oral Daily   Continuous Infusions:      LOS: 6 days    Time spent:    Zannie Cove, MD [facility]   11/07/2023, 10:24 AM

## 2023-11-07 NOTE — Plan of Care (Signed)
  Problem: Clinical Measurements: Goal: Will remain free from infection Outcome: Progressing Goal: Respiratory complications will improve Outcome: Progressing   Problem: Activity: Goal: Risk for activity intolerance will decrease Outcome: Progressing   Problem: Nutrition: Goal: Adequate nutrition will be maintained Outcome: Progressing   Problem: Elimination: Goal: Will not experience complications related to bowel motility Outcome: Progressing Goal: Will not experience complications related to urinary retention Outcome: Progressing   Problem: Pain Managment: Goal: General experience of comfort will improve and/or be controlled Outcome: Progressing   Problem: Safety: Goal: Ability to remain free from injury will improve Outcome: Progressing

## 2023-11-07 NOTE — Plan of Care (Signed)
°  Problem: Education: Goal: Knowledge of General Education information will improve Description: Including pain rating scale, medication(s)/side effects and non-pharmacologic comfort measures Outcome: Progressing   Problem: Safety: Goal: Ability to remain free from injury will improve Outcome: Progressing   Problem: Clinical Measurements: Goal: Diagnostic test results will improve Outcome: Progressing     

## 2023-11-07 NOTE — Progress Notes (Signed)
Occupational Therapy Treatment Patient Details Name: Christine Christensen MRN: [id number] DOB: 05-08-1946 Today's Date: 11/07/2023   History of present illness 78 y.o. female presents to [facility] hospital on 11/01/2023 with cough and fever, found to have fluA. Pt recently hospitalized for UTI and discharged to SNF. PMH includes dementia, CVA with RLE weakness, HTN.   OT comments  Pt progressing toward goals this session, needing min A for seated grooming task and able to reach down to pull up socks in sitting. Pt min A for bed mobility and bed > chair transfer with RW. HR 70-100 A fib rhythm. Pt presenting with impairments listed below, will follow acutely. Patient will benefit from continued inpatient follow up therapy, <3 hours/day to maximize safety/ind with ADL/functional mobility.       If plan is discharge home, recommend the following:  A little help with walking and/or transfers;A little help with bathing/dressing/bathroom;Assistance with cooking/housework;Direct supervision/assist for medications management;Direct supervision/assist for financial management;Help with stairs or ramp for entrance;Assist for transportation   Equipment Recommendations  Other (comment) (defer)    Recommendations for Other Services PT consult    Precautions / Restrictions Precautions Precautions: Fall Recall of Precautions/Restrictions: Impaired Restrictions Weight Bearing Restrictions Per Provider Order: No       Mobility Bed Mobility Overal bed mobility: Needs Assistance       Supine to sit: Min assist          Transfers Overall transfer level: Needs assistance Equipment used: Rolling walker (2 wheels) Transfers: Sit to/from Stand Sit to Stand: Min assist                 Balance Overall balance assessment: Needs assistance Sitting-balance support: No upper extremity supported, Feet supported Sitting balance-Leahy Scale: Good Sitting balance - Comments: reaches outside BOS without  LOB   Standing balance support: Bilateral upper extremity supported, Reliant on assistive device for balance Standing balance-Leahy Scale: Poor Standing balance comment: unable to assess                           ADL either performed or assessed with clinical judgement   ADL Overall ADL's : Needs assistance/impaired     Grooming: Wash/dry face;Sitting Grooming Details (indicate cue type and reason): washes face EOB             Lower Body Dressing: Minimal assistance Lower Body Dressing Details (indicate cue type and reason): able to reach down and pull up socks in sitting             Functional mobility during ADLs: Minimal assistance;Rolling walker (2 wheels)      Extremity/Trunk Assessment Upper Extremity Assessment Upper Extremity Assessment: Generalized weakness   Lower Extremity Assessment Lower Extremity Assessment: Defer to PT evaluation        Vision   Vision Assessment?: No apparent visual deficits   Perception Perception Perception: Not tested   Praxis Praxis Praxis: Not tested   Communication Communication Communication: No apparent difficulties   Cognition Arousal: Alert Behavior During Therapy: Flat affect Cognition: History of cognitive impairments             OT - Cognition Comments: hx of dementia, follows commands with incr time, oriented to self and date of birth, aware she is from SNF and at [facility]                 Following commands: Intact        Cueing   Cueing  Techniques: Verbal cues  Exercises      Shoulder Instructions       General Comments HR A fib 70-100bpm    Pertinent Vitals/ Pain       Pain Assessment Pain Assessment: No/denies pain  Home Living                                          Prior Functioning/Environment              Frequency           Progress Toward Goals  OT Goals(current goals can now be found in the care plan section)  Progress towards  OT goals: Progressing toward goals  Acute Rehab OT Goals Patient Stated Goal: none stated OT Goal Formulation: With patient Time For Goal Achievement: 11/20/23 Potential to Achieve Goals: Good ADL Goals Pt Will Perform Upper Body Dressing: sitting;with min assist Pt Will Perform Lower Body Dressing: sit to/from stand;sitting/lateral leans;with min assist Pt Will Transfer to Toilet: with supervision;stand pivot transfer;squat pivot transfer;bedside commode Additional ADL Goal #1: pt will perform bed mobility min A in prep for ADLs  Plan      Co-evaluation                 AM-PAC OT "6 Clicks" Daily Activity     Outcome Measure   Help from another person eating meals?: A Little Help from another person taking care of personal grooming?: A Little Help from another person toileting, which includes using toliet, bedpan, or urinal?: A Lot Help from another person bathing (including washing, rinsing, drying)?: A Lot Help from another person to put on and taking off regular upper body clothing?: A Little Help from another person to put on and taking off regular lower body clothing?: A Lot 6 Click Score: 15    End of Session    OT Visit Diagnosis: Unsteadiness on feet (R26.81);Other abnormalities of gait and mobility (R26.89);Muscle weakness (generalized) (M62.81)   Activity Tolerance Patient tolerated treatment well   Patient Left in chair;with call bell/phone within reach;with chair alarm set   Nurse Communication Mobility status        Time: 8657-8469 OT Time Calculation (min): 17 min  Charges: OT General Charges $OT Visit: 1 Visit OT Treatments $Self Care/Home Management : 8-22 mins  Christine Fila, OTD, OTR/L SecureChat Preferred Acute Rehab [phone]   Christine Christensen 11/07/2023, 8:24 AM

## 2023-11-07 NOTE — Progress Notes (Signed)
Telemetry called to report patients cardiac rhythm of sick sinus syndrome and PVC's.  MD ordered EKG.  EKG was obtained and MD notified.  No new orders obtained.  Patient resting comfortably no s/s of distress noted.

## 2023-11-08 DIAGNOSIS — J189 Pneumonia, unspecified organism: Secondary | ICD-10-CM | POA: Diagnosis not present

## 2023-11-08 LAB — BASIC METABOLIC PANEL
Anion gap: 8 (ref 5–15)
BUN: 17 mg/dL (ref 8–23)
CO2: 32 mmol/L (ref 22–32)
Calcium: 9.5 mg/dL (ref 8.9–10.3)
Chloride: 100 mmol/L (ref 98–111)
Creatinine, Ser: 0.85 mg/dL (ref 0.44–1.00)
GFR, Estimated: >60 mL/min (ref >60)
Glucose, Bld: 94 mg/dL (ref 70–99)
Potassium: 3.6 mmol/L (ref 3.5–5.1)
Sodium: 140 mmol/L (ref 135–145)

## 2023-11-08 MED ORDER — SPIRONOLACTONE 25 MG PO TABS: 25.0000 mg | ORAL_TABLET | Freq: Every day | ORAL | Status: DC

## 2023-11-08 MED ORDER — METOPROLOL TARTRATE 25 MG PO TABS: 25.0000 mg | ORAL_TABLET | Freq: Two times a day (BID) | ORAL | Status: DC

## 2023-11-08 MED ORDER — TORSEMIDE 20 MG PO TABS: 20.0000 mg | ORAL_TABLET | Freq: Every day | ORAL | Status: DC

## 2023-11-08 NOTE — Discharge Summary (Signed)
Physician Discharge Summary  Christine Christensen Christensen:[id number] DOB: 1946-05-05 DOA: 11/01/2023  PCP: Fleet Contras, MD  Admit date: 11/01/2023 Discharge date: 11/08/2023  Time spent: 45 minutes  Recommendations for Outpatient Follow-up:  Back to SNF for long-term care Recommendations and Form for two-physician DNR sent to DSS guardian at [facility] Recommend palliative care evaluation at SNF   Discharge Diagnoses:   Acute on chronic diastolic CHF Severe mitral stenosis Pulmonary hypertension Multifocal pneumonia Sepsis Recent influenza A Mild dementia History of CVA Chronic debility, bed/wheelchair bound   Discharge Condition: improved  Diet recommendation: Low-sodium, heart healthy  Filed Weights   11/06/23 0453 11/07/23 0500 11/08/23 0500  Weight: 60.7 kg 61.2 kg 58.4 kg    History of present illness:  78/F with vascular dementia, CVA, mitral stenosis, chronic right lower extremity weakness, hypertension was recently hospitalized at [facility] for aphasia, cognitive deficits, eventually diagnosed with E. coli UTI and discharged back to SNF on 1/26, ..back in the ER 2/7 with cough and fever, diagnosed with influenza A, discharged back.  -Back in the ER 2/13  with cough, fever, shortness of breath. In ED Tachycardic, mildly hypoxic, labs noted WBC of 17.9, creatinine 0.8, troponin 7, lactic acid 2.0, CXR with persistent bibasilar opacities  Hospital Course:   Sepsis,  Multifocal pneumonia -Presenting with fever, cough, tachycardia and leukocytosis, x-ray with bibasilar opacities -Suspected to be secondary infection following influenza A versus aspiration -completed 6 days of ABX -Blood cultures negative thus far,  -SLP eval completed.,  No overt aspiration noted -Increase activity, PT OT   Transient hypotension -Improved -Likely multifactorial, has known mitral stenosis, blood pressure in stable   H/o severe mitral stenosis Acute on chronic diastolic CHF Pulmonary  hypertension -Echo 2/16 with hyperdynamic LVEF> 75%, poor study, valves could not be well assessed -Volume status has improved, down 15 LB, changed to oral torsemide, continue Aldactone  -With her dementia and significant debility I do not think she would be a candidate for intervention if severe mitral stenosis is confirmed again -Contacted DSS guardian Cala Bradford  and notified of such, recommended DNR, we sent 2 physician DNR paperwork to her DSS guardian   Influenza A Completed Tamiflu course   Severe hypoalbuminemia Severe protein calorie malnutrition -Supplements as tolerated   Sinus tachycardia, PACs -Worsened in the setting of above, continue Toprol, improving -Discontinue Xopenex   Dementia Anxiety, insomnia -Continue home regimen of Aricept, Namenda, BuSpar and Lexapro   Debility -Bed, wheelchair-bound at baseline   History of CVA -Continue aspirin   History of UTIs -Just completed a course of antibiotics for E. Coli -UA unremarkable Discharge Exam: Vitals:   11/08/23 0523 11/08/23 0807  BP: (!) 142/126 107/77  Pulse: 60 93  Resp: 18   Temp: 98.1 F (36.7 C) 98.5 F (36.9 C)  SpO2: 99% 100%   General exam: Chronically ill female laying in bed, AAO x 2, cognitive deficits noted HEENT: No JVD CVS: S1-S2, regular rhythm, diastolic murmur Lungs: Decreased breath sounds at the bases otherwise clear Abdomen: Soft, nontender, bowel sounds present Extremities: No edema  neuro: Chronic right leg weakness  Skin: No rashes Psychiatry: Flat affect, poor insight and judgment  Discharge Instructions   Discharge Instructions     Diet - low sodium heart healthy   Complete by: As directed    Discharge wound care:   Complete by: As directed    routine   Increase activity slowly   Complete by: As directed       Allergies  as of 11/08/2023       Reactions   Penicillins Other (See Comments)   Unknown reaction        Medication List     STOP taking these  medications    azithromycin 250 MG tablet Commonly known as: Zithromax Z-Pak   nitrofurantoin (macrocrystal-monohydrate) 100 MG capsule Commonly known as: MACROBID   oseltamivir 75 MG capsule Commonly known as: TAMIFLU   traZODone 50 MG tablet Commonly known as: DESYREL       TAKE these medications    acetaminophen 500 MG tablet Commonly known as: TYLENOL Take 2 tablets (1,000 mg total) by mouth every 6 (six) hours as needed for mild pain, moderate pain, fever or headache.   albuterol 108 (90 Base) MCG/ACT inhaler Commonly known as: VENTOLIN HFA Inhale 2 puffs into the lungs every 4 (four) hours as needed for wheezing or shortness of breath.   aspirin 325 MG tablet Take 1 tablet (325 mg total) by mouth daily.   busPIRone 5 MG tablet Commonly known as: BUSPAR Take 5 mg by mouth 3 (three) times daily.   donepezil 5 MG tablet Commonly known as: ARICEPT Take 5 mg by mouth in the morning and at bedtime.   escitalopram 20 MG tablet Commonly known as: LEXAPRO Take 20 mg by mouth every morning.   fexofenadine 180 MG tablet Commonly known as: ALLEGRA Take 180 mg by mouth daily as needed for allergies or rhinitis.   LACTOBACILLUS PO Take 1 capsule by mouth in the morning and at bedtime.   melatonin 3 MG Tabs tablet Take 1 tablet (3 mg total) by mouth at bedtime.   memantine 10 MG tablet Commonly known as: NAMENDA Take 10 mg by mouth 2 (two) times daily.   metoprolol tartrate 25 MG tablet Commonly known as: LOPRESSOR Take 1 tablet (25 mg total) by mouth 2 (two) times daily. What changed: how much to take   multivitamin with minerals Tabs tablet Take 1 tablet by mouth daily.   polyethylene glycol 17 g packet Commonly known as: MIRALAX / GLYCOLAX Take 17 g by mouth 2 (two) times daily.   senna-docusate 8.6-50 MG tablet Commonly known as: Senokot-S Take 1 tablet by mouth 2 (two) times daily.   spironolactone 25 MG tablet Commonly known as: ALDACTONE Take 1  tablet (25 mg total) by mouth daily. Start taking on: November 09, 2023   torsemide 20 MG tablet Commonly known as: DEMADEX Take 1 tablet (20 mg total) by mouth daily. Start taking on: November 09, 2023               Discharge Care Instructions  (From admission, onward)           Start     Ordered   11/08/23 0000  Discharge wound care:       Comments: routine   11/08/23 0943           Allergies  Allergen Reactions   Penicillins Other (See Comments)    Unknown reaction      The results of significant diagnostics from this hospitalization (including imaging, microbiology, ancillary and laboratory) are listed below for reference.    Significant Diagnostic Studies: DG CHEST PORT 1 VIEW Result Date: 11/05/2023 CLINICAL DATA:  Follow-up congestive heart failure. EXAM: PORTABLE CHEST 1 VIEW COMPARISON:  Radiographs 11/03/2023 and 11/01/2023. FINDINGS: 0520 hours. The heart now appears mildly enlarged. There is aortic atherosclerosis with worsening pulmonary edema and enlarged bilateral pleural effusions. No evidence of pneumothorax or confluent airspace  disease. No acute osseous findings are evident. Telemetry leads overlie the chest. IMPRESSION: Worsening congestive heart failure with pulmonary edema and enlarged bilateral pleural effusions. Electronically Signed   By: Carey Bullocks M.D.   On: 11/05/2023 09:42   ECHOCARDIOGRAM COMPLETE Result Date: 11/04/2023    ECHOCARDIOGRAM REPORT   Patient Name:   TRICIA PLEDGER [facility] Date of Exam: 11/04/2023 Medical Rec #:  [id number]        Height:       64.0 in Accession #:    [id number]       Weight:       149.9 lb Date of Birth:  Dec 14, 1945         BSA:          1.731 m Patient Age:    78 years         BP:           109/63 mmHg Patient Gender: F                HR:           - bpm. Exam Location:  Inpatient Procedure: 2D Echo, Cardiac Doppler and Color Doppler (Both Spectral and Color            Flow Doppler were utilized during  procedure). Indications:    Shock  History:        Patient has prior history of Echocardiogram examinations, most                 recent 06/13/2021. CHF, Dementia; Risk Factors:Hypertension.  Sonographer:    Amy Chionchio Referring Phys: [id number] OMAR M ALBUSTAMI IMPRESSIONS  1. Difficult study due to the patient not cooperating with exam, full exam not performed. LV function is hyperdynamic >75%. RV now well visualized. Incomplete evaluation of all cardiac valves.  2. Left ventricular ejection fraction, by estimation, is >75%. The left ventricle has hyperdynamic function. The left ventricle has no regional wall motion abnormalities. There is mild asymmetric left ventricular hypertrophy of the basal-septal segment.  Left ventricular diastolic function could not be evaluated.  3. Right ventricular systolic function was not well visualized. The right ventricular size is not well visualized.  4. The mitral valve is degenerative. Severe mitral annular calcification.  5. The aortic valve was not well visualized. Comparison(s): No significant change from prior study. No change in LV function. Again, the patient did not cooperate with the exam, so further comparison cannot be performed. FINDINGS  Left Ventricle: Left ventricular ejection fraction, by estimation, is >75%. The left ventricle has hyperdynamic function. The left ventricle has no regional wall motion abnormalities. Strain imaging was not performed. The left ventricular internal cavity size was small. There is mild asymmetric left ventricular hypertrophy of the basal-septal segment. Left ventricular diastolic function could not be evaluated. Right Ventricle: The right ventricular size is not well visualized. Right vetricular wall thickness was not well visualized. Right ventricular systolic function was not well visualized. Left Atrium: Left atrial size was not assessed. Right Atrium: Right atrial size was not assessed. Pericardium: There is no evidence of  pericardial effusion. Mitral Valve: The mitral valve is degenerative in appearance. Severe mitral annular calcification. Tricuspid Valve: The tricuspid valve is grossly normal. Tricuspid valve regurgitation is trivial. No evidence of tricuspid stenosis. Aortic Valve: The aortic valve was not well visualized. Pulmonic Valve: The pulmonic valve was grossly normal. Pulmonic valve regurgitation is trivial. No evidence of pulmonic stenosis. Aorta: The aortic root was not well visualized. Additional  Comments: 3D imaging was not performed. There is a small pleural effusion in the left lateral region.  LEFT VENTRICLE PLAX 2D LVIDd:         2.80 cm LVIDs:         1.70 cm LV PW:         1.00 cm LV IVS:        1.30 cm LVOT diam:     1.90 cm LVOT Area:     2.84 cm  PULMONIC VALVE PV Vmax:          1.25 m/s PV Peak grad:     6.2 mmHg PR End Diast Vel: 18.66 msec   SHUNTS Systemic Diam: 1.90 cm Lennie Odor MD Electronically signed by Lennie Odor MD Signature Date/Time: 11/04/2023/1:42:10 PM    Final    DG Chest Port 1 View Result Date: 11/03/2023 CLINICAL DATA:  Dyspnea EXAM: PORTABLE CHEST 1 VIEW COMPARISON:  10/31/2013 FINDINGS: Moderate bilateral pleural effusions are present, enlarged since prior examination. Central pulmonary vascular congestion and superimposed perihilar interstitial pulmonary edema appears progressive since prior examination. No pneumothorax. Cardiac size is within normal limits. No acute bone abnormality. IMPRESSION: 1. Progressive congestive heart failure. Electronically Signed   By: Helyn Numbers M.D.   On: 11/03/2023 23:57   DG Chest Port 1 View Result Date: 11/01/2023 CLINICAL DATA:  Cough EXAM: PORTABLE CHEST 1 VIEW COMPARISON:  10/27/2023 FINDINGS: Stable heart size. Aortic atherosclerosis. Persistent but improving patchy airspace opacities bilaterally. No appreciable pleural effusion. No pneumothorax. IMPRESSION: Persistent but improving patchy airspace opacities bilaterally.  Electronically Signed   By: Duanne Guess D.O.   On: 11/01/2023 14:35   DG Chest Port 1 View Result Date: 10/27/2023 CLINICAL DATA:  Questionable sepsis - evaluate for abnormality EXAM: PORTABLE CHEST 1 VIEW COMPARISON:  10/12/2023 FINDINGS: Heart and mediastinal stones crash that heart and mediastinal contours within normal limits. Aortic atherosclerosis. Worsening patchy bilateral airspace disease. No effusions or acute bony abnormality. IMPRESSION: Worsening patchy bilateral airspace disease concerning for pneumonia. Electronically Signed   By: Charlett Nose M.D.   On: 10/27/2023 00:19   DG CHEST PORT 1 VIEW Result Date: 10/12/2023 CLINICAL DATA:  Altered mental status EXAM: PORTABLE CHEST 1 VIEW COMPARISON:  02/04/2023 FINDINGS: Mild diffuse bronchitic changes. No consolidation or effusion. Stable cardiomediastinal silhouette with aortic atherosclerosis IMPRESSION: No active disease. Mild diffuse bronchitic changes. Electronically Signed   By: Jasmine Pang M.D.   On: 10/12/2023 20:47   MR BRAIN WO CONTRAST Result Date: 10/12/2023 CLINICAL DATA:  Initial evaluation for acute neuro deficit, stroke suspected. EXAM: MRI HEAD WITHOUT CONTRAST TECHNIQUE: Multiplanar, multiecho pulse sequences of the brain and surrounding structures were obtained without intravenous contrast. COMPARISON:  Prior head CT from earlier the same day. FINDINGS: Brain: Generalized age-related cerebral volume loss. Patchy T2/FLAIR hyperintensity involving the periventricular and deep white matter both cerebral hemispheres as well as the pons, consistent with chronic microvascular ischemic disease, moderate to advanced in nature. Scattered remote lacunar infarcts present about the bilateral basal ganglia and pons. Few small chronic cerebellar infarcts noted, right greater than left. No evidence for acute or subacute ischemia. Gray-white matter differentiation maintained. No areas of chronic cortical infarction. No acute intracranial  hemorrhage. Few scattered chronic micro hemorrhages noted about the thalami, likely hypertensive in nature. No mass lesion, midline shift or mass effect. No hydrocephalus or extra-axial fluid collection. Pituitary gland suprasellar region within normal limits. Vascular: Major intracranial vascular flow voids are maintained. Skull and upper cervical spine: Craniocervical  junction within normal limits. Bone marrow signal intensity normal. No scalp soft tissue abnormality. Sinuses/Orbits: Prior bilateral ocular lens replacement. Paranasal sinuses are largely clear. No significant mastoid effusion. Other: Under IMPRESSION: 1. No acute intracranial abnormality. 2. Age-related cerebral volume loss with moderate to advanced chronic microvascular ischemic disease, with a few scattered remote lacunar infarcts about the bilateral basal ganglia, pons, and cerebellum. Electronically Signed   By: Rise Mu M.D.   On: 10/12/2023 20:07   CT Head Wo Contrast Result Date: 10/12/2023 CLINICAL DATA:  Head trauma, altered mental status. EXAM: CT HEAD WITHOUT CONTRAST TECHNIQUE: Contiguous axial images were obtained from the base of the skull through the vertex without intravenous contrast. RADIATION DOSE REDUCTION: This exam was performed according to the departmental dose-optimization program which includes automated exposure control, adjustment of the mA and/or kV according to patient size and/or use of iterative reconstruction technique. COMPARISON:  02/04/2023 FINDINGS: Brain: No evidence of acute infarction, hemorrhage, mass, mass effect, or midline shift. No hydrocephalus or extra-axial fluid collection. Periventricular white matter changes, likely the sequela of chronic small vessel ischemic disease. Age related cerebral atrophy. Remote lacunar infarct in the basal ganglia, cerebellum, right frontal lobe, and brainstem. Vascular: No hyperdense vessel. No hyperdense vessel. Atherosclerotic calcifications in the  intracranial carotid and vertebral arteries. Skull: Negative for fracture or focal lesion. Hyperostosis frontalis. Sinuses/Orbits: No acute finding. Other: The mastoid air cells are well aerated. IMPRESSION: No acute intracranial process. Electronically Signed   By: Wiliam Ke M.D.   On: 10/12/2023 13:09    Microbiology: Recent Results (from the past 240 hours)  Culture, blood (routine x 2)     Status: None   Collection Time: 11/01/23 12:13 PM   Specimen: BLOOD  Result Value Ref Range Status   Specimen Description BLOOD RIGHT ANTECUBITAL  Final   Special Requests   Final    BOTTLES DRAWN AEROBIC AND ANAEROBIC Blood Culture adequate volume   Culture   Final    NO GROWTH 5 DAYS Performed at [facility], [address] [place], [place] [address]    Report Status 11/06/2023 FINAL  Final  Culture, blood (routine x 2)     Status: None   Collection Time: 11/01/23 12:18 PM   Specimen: BLOOD LEFT FOREARM  Result Value Ref Range Status   Specimen Description BLOOD LEFT FOREARM  Final   Special Requests   Final    BOTTLES DRAWN AEROBIC AND ANAEROBIC Blood Culture results may not be optimal due to an inadequate volume of blood received in culture bottles   Culture   Final    NO GROWTH 5 DAYS Performed at [facility], [address] [place], [place] [address]    Report Status 11/06/2023 FINAL  Final  MRSA Next Gen by PCR, Nasal     Status: None   Collection Time: 11/02/23  1:41 AM   Specimen: Nasal Mucosa; Nasal Swab  Result Value Ref Range Status   MRSA by PCR Next Gen NOT DETECTED NOT DETECTED Final    Comment: (NOTE) The GeneXpert MRSA Assay (FDA approved for NASAL specimens only), is one component of a comprehensive MRSA colonization surveillance program. It is not intended to diagnose MRSA infection nor to guide or monitor treatment for MRSA infections. Test performance is not FDA approved in patients less than 32 years old. Performed at [facility],  [address], [place], [place] [address]   Culture, blood (Routine X 2) w Reflex to ID  Panel     Status: None (Preliminary result)   Collection Time: 11/04/23  3:47 AM   Specimen: BLOOD RIGHT HAND  Result Value Ref Range Status   Specimen Description BLOOD RIGHT HAND  Final   Special Requests   Final    BOTTLES DRAWN AEROBIC AND ANAEROBIC Blood Culture results may not be optimal due to an inadequate volume of blood received in culture bottles   Culture   Final    NO GROWTH 4 DAYS Performed at [facility], [address], [place], [place] [address]    Report Status PENDING  Incomplete  Culture, blood (Routine X 2) w Reflex to ID Panel     Status: None (Preliminary result)   Collection Time: 11/04/23  3:47 AM   Specimen: BLOOD LEFT HAND  Result Value Ref Range Status   Specimen Description BLOOD LEFT HAND  Final   Special Requests   Final    BOTTLES DRAWN AEROBIC AND ANAEROBIC Blood Culture results may not be optimal due to an inadequate volume of blood received in culture bottles   Culture   Final    NO GROWTH 4 DAYS Performed at [facility], [address] [place], [place] [address]    Report Status PENDING  Incomplete     Labs: Basic Metabolic Panel: Recent Labs  Lab 11/04/23 0348 11/04/23 0910 11/04/23 2024 11/05/23 0422 11/06/23 0636 11/07/23 0630 11/08/23 0624  NA 141   < > 142 140 140 140 140  K 3.5   < > 3.2* 3.5 3.8 3.6 3.6  CL 108  --  105 103 101 99 100  CO2 22  --  26 26 28 29  32  GLUCOSE 95  --  94 90 98 93 94  BUN 14  --  9 8 11 14 17   CREATININE 0.90  --  0.83 0.75 0.82 0.87 0.85  CALCIUM 8.0*  --  8.4* 8.7* 8.8* 9.2 9.5  MG 1.9  --  1.9  --   --   --   --   PHOS 3.0  --   --   --   --   --   --    < > = values in this interval not displayed.   Liver Function Tests: Recent Labs  Lab 11/01/23 1235 11/02/23 0722 11/04/23 0348 11/06/23 0636  AST 21 15 14* 23  ALT 16 12 12 10   ALKPHOS 74 58 54 57  BILITOT 1.0 0.7 0.3 0.4  PROT 6.8  5.1* 5.8* 6.0*  ALBUMIN 2.7* 2.0* 2.3* 2.4*   No results for input(s): "LIPASE", "AMYLASE" in the last 168 hours. Recent Labs  Lab 11/04/23 0348  AMMONIA <10   CBC: Recent Labs  Lab 11/01/23 1235 11/01/23 1243 11/02/23 0722 11/03/23 0259 11/04/23 0348 11/04/23 0910 11/05/23 0422 11/06/23 0636  WBC 17.9*   < > 11.0* 10.4 9.8  --  8.7 8.7  NEUTROABS 15.5*  --   --   --   --   --   --   --   HGB 11.3*   < > 9.0* 9.3* 9.6* 9.5* 10.1* 10.6*  HCT 33.5*   < > 27.7* 28.8* 29.0* 28.0* 30.5* 31.7*  MCV 86.8   < > 88.2 88.1 89.0  --  87.1 85.7  PLT 219   < > 216 268 321  --  403* 487*   < > = values in this interval not displayed.   Cardiac Enzymes: No results for input(s): "  CKTOTAL", "CKMB", "CKMBINDEX", "TROPONINI" in the last 168 hours. BNP: BNP (last 3 results) No results for input(s): "BNP" in the last 8760 hours.  ProBNP (last 3 results) No results for input(s): "PROBNP" in the last 8760 hours.  CBG: Recent Labs  Lab 11/04/23 0037 11/04/23 0238  GLUCAP 98 79       Signed:  Zannie Cove MD.  [facility] 11/08/2023, 9:43 AM

## 2023-11-08 NOTE — Plan of Care (Signed)
  Problem: Clinical Measurements: Goal: Cardiovascular complication will be avoided Outcome: Progressing   Problem: Elimination: Goal: Will not experience complications related to urinary retention Outcome: Progressing   Problem: Pain Managment: Goal: General experience of comfort will improve and/or be controlled Outcome: Progressing   Problem: Safety: Goal: Ability to remain free from injury will improve Outcome: Progressing   Problem: Skin Integrity: Goal: Risk for impaired skin integrity will decrease Outcome: Progressing

## 2023-11-08 NOTE — Progress Notes (Signed)
Report called to [facility].

## 2023-11-08 NOTE — TOC Transition Note (Signed)
Transition of Care [facility]) - Discharge Note   Patient Details  Name: Christine Christensen MRN: [id number] Date of Birth: 03-12-1946  Transition of [facility]) CM/SW Contact:  Yusra Ravert A [place], LCSW Phone Number: 11/08/2023, 12:30 PM   Clinical Narrative:     Patient will DC to: [facility]  Anticipated DC date: 11/08/23  Family notified: [name]   Transport by: Sharin Mons     Per MD patient ready for DC to [facility]. RN, patient, patient's family, and facility notified of DC. Discharge Summary and FL2 sent to facility. RN to call report prior to discharge (Room 103, [phone]). DC packet on chart. Ambulance transport requested for patient. CSW spoke with guardian who confirmed she had received completed requested Physician DNR approval form, she informed CSW DNR could be completed once her supervisor approved it.      CSW will sign off for now as social work intervention is no longer needed. Please consult [place] again if new needs arise.   Final next level of care: Skilled Nursing Facility Barriers to Discharge: Barriers Resolved   Patient Goals and CMS Choice            Discharge Placement              Patient chooses bed at:  [facility]) Patient to be transferred to facility by: [facility] Name of family member notified: [name], legal guardian Patient and family notified of of transfer: 11/08/23  Discharge Plan and Services Additional resources added to the After Visit Summary for                                       Social Drivers of Health (SDOH) Interventions SDOH Screenings   Food Insecurity: No Food Insecurity (11/01/2023)  Housing: Low Risk  (11/01/2023)  Transportation Needs: No Transportation Needs (11/01/2023)  Utilities: Not At Risk (11/01/2023)  Social Connections: Patient Unable To Answer (11/01/2023)  Tobacco Use: Medium Risk (11/01/2023)     Readmission Risk Interventions     No data to display

## 2023-11-08 NOTE — Progress Notes (Signed)
Tired calling report to the [facility] 4-5 times. The number does not ring at all.

## 2023-11-08 NOTE — Progress Notes (Signed)
Mobility Specialist: Progress Note   11/08/23 1124  Mobility  Activity Transferred from bed to chair  Level of Assistance Minimal assist, patient does 75% or more  Assistive Device Front wheel walker  Activity Response Tolerated well  Mobility Referral Yes  Mobility visit 1 Mobility  Mobility Specialist Start Time (ACUTE ONLY) 0825  Mobility Specialist Stop Time (ACUTE ONLY) [id number]  Mobility Specialist Time Calculation (min) (ACUTE ONLY) 13 min    Pt was agreeable to mobility session - received in bed. MinA throughout. No complaints. Left in chair with all needs met, call bell in reach. Chair alarm on.    Maurene Capes Mobility Specialist Please contact via SecureChat or Rehab office at [phone]

## 2023-11-08 NOTE — Progress Notes (Signed)
Mobility Specialist: Progress Note   11/08/23 1126  Mobility  Activity Transferred from chair to bed  Level of Assistance Maximum assist, patient does 25-49%  Assistive Device Other (Comment) (HHA)  Activity Response Tolerated fair  Mobility visit 1 Mobility  Mobility Specialist Start Time (ACUTE ONLY) 1100  Mobility Specialist Stop Time (ACUTE ONLY) 1122  Mobility Specialist Time Calculation (min) (ACUTE ONLY) 22 min    Assisted NT and RN with standing pt up for peri care and transferring pt back to bed. MaxA with HHA throughout session. Left in bed with all needs met, call bell in reach.   Maurene Capes Mobility Specialist Please contact via SecureChat or Rehab office at [phone]

## 2023-11-09 LAB — CULTURE, BLOOD (ROUTINE X 2)
Culture: NO GROWTH
Culture: NO GROWTH

## 2023-12-05 ENCOUNTER — Other Ambulatory Visit: Payer: Self-pay

## 2023-12-05 ENCOUNTER — Emergency Department (HOSPITAL_COMMUNITY)

## 2023-12-05 ENCOUNTER — Encounter (HOSPITAL_COMMUNITY): Payer: Self-pay | Admitting: Emergency Medicine

## 2023-12-05 ENCOUNTER — Inpatient Hospital Stay (HOSPITAL_COMMUNITY)
Admission: EM | Admit: 2023-12-05 | Discharge: 2023-12-09 | DRG: 689 | Disposition: A | Discharge status: Skilled Nursing Facility | Source: Skilled Nursing Facility | Attending: Internal Medicine | Admitting: Internal Medicine

## 2023-12-05 DIAGNOSIS — Z1152 Encounter for screening for COVID-19: Secondary | ICD-10-CM

## 2023-12-05 DIAGNOSIS — I110 Hypertensive heart disease with heart failure: Secondary | ICD-10-CM | POA: Diagnosis present

## 2023-12-05 DIAGNOSIS — Z781 Physical restraint status: Secondary | ICD-10-CM

## 2023-12-05 DIAGNOSIS — F0153 Vascular dementia, unspecified severity, with mood disturbance: Secondary | ICD-10-CM | POA: Diagnosis present

## 2023-12-05 DIAGNOSIS — F32A Depression, unspecified: Secondary | ICD-10-CM | POA: Diagnosis present

## 2023-12-05 DIAGNOSIS — Z993 Dependence on wheelchair: Secondary | ICD-10-CM

## 2023-12-05 DIAGNOSIS — Z87440 Personal history of urinary (tract) infections: Secondary | ICD-10-CM

## 2023-12-05 DIAGNOSIS — G9341 Metabolic encephalopathy: Secondary | ICD-10-CM | POA: Diagnosis present

## 2023-12-05 DIAGNOSIS — R4182 Altered mental status, unspecified: Principal | ICD-10-CM | POA: Diagnosis present

## 2023-12-05 DIAGNOSIS — Z7982 Long term (current) use of aspirin: Secondary | ICD-10-CM

## 2023-12-05 DIAGNOSIS — Z96653 Presence of artificial knee joint, bilateral: Secondary | ICD-10-CM | POA: Diagnosis present

## 2023-12-05 DIAGNOSIS — Z8249 Family history of ischemic heart disease and other diseases of the circulatory system: Secondary | ICD-10-CM

## 2023-12-05 DIAGNOSIS — B9620 Unspecified Escherichia coli [E. coli] as the cause of diseases classified elsewhere: Secondary | ICD-10-CM | POA: Diagnosis present

## 2023-12-05 DIAGNOSIS — F01518 Vascular dementia, unspecified severity, with other behavioral disturbance: Secondary | ICD-10-CM | POA: Diagnosis present

## 2023-12-05 DIAGNOSIS — I080 Rheumatic disorders of both mitral and aortic valves: Secondary | ICD-10-CM | POA: Diagnosis present

## 2023-12-05 DIAGNOSIS — Z66 Do not resuscitate: Secondary | ICD-10-CM | POA: Diagnosis present

## 2023-12-05 DIAGNOSIS — Z820 Family history of epilepsy and other diseases of the nervous system: Secondary | ICD-10-CM

## 2023-12-05 DIAGNOSIS — Z8489 Family history of other specified conditions: Secondary | ICD-10-CM

## 2023-12-05 DIAGNOSIS — Z1612 Extended spectrum beta lactamase (ESBL) resistance: Secondary | ICD-10-CM | POA: Diagnosis present

## 2023-12-05 DIAGNOSIS — N390 Urinary tract infection, site not specified: Secondary | ICD-10-CM | POA: Diagnosis not present

## 2023-12-05 DIAGNOSIS — F17210 Nicotine dependence, cigarettes, uncomplicated: Secondary | ICD-10-CM | POA: Diagnosis present

## 2023-12-05 DIAGNOSIS — Z8673 Personal history of transient ischemic attack (TIA), and cerebral infarction without residual deficits: Secondary | ICD-10-CM

## 2023-12-05 DIAGNOSIS — Z96641 Presence of right artificial hip joint: Secondary | ICD-10-CM | POA: Diagnosis present

## 2023-12-05 DIAGNOSIS — Z8619 Personal history of other infectious and parasitic diseases: Secondary | ICD-10-CM

## 2023-12-05 DIAGNOSIS — I5032 Chronic diastolic (congestive) heart failure: Secondary | ICD-10-CM | POA: Diagnosis present

## 2023-12-05 DIAGNOSIS — Z880 Allergy status to penicillin: Secondary | ICD-10-CM

## 2023-12-05 DIAGNOSIS — Z79899 Other long term (current) drug therapy: Secondary | ICD-10-CM

## 2023-12-05 DIAGNOSIS — F0154 Vascular dementia, unspecified severity, with anxiety: Secondary | ICD-10-CM | POA: Diagnosis present

## 2023-12-05 LAB — COMPREHENSIVE METABOLIC PANEL
ALT: 13 U/L (ref 0–44)
AST: 20 U/L (ref 15–41)
Albumin: 3.7 g/dL (ref 3.5–5.0)
Alkaline Phosphatase: 55 U/L (ref 38–126)
Anion gap: 7 (ref 5–15)
BUN: 15 mg/dL (ref 8–23)
CO2: 32 mmol/L (ref 22–32)
Calcium: 9.3 mg/dL (ref 8.9–10.3)
Chloride: 102 mmol/L (ref 98–111)
Creatinine, Ser: 1.17 mg/dL — ABNORMAL HIGH (ref 0.44–1.00)
GFR, Estimated: 48 mL/min — ABNORMAL LOW (ref >60)
Glucose, Bld: 96 mg/dL (ref 70–99)
Potassium: 3.9 mmol/L (ref 3.5–5.1)
Sodium: 141 mmol/L (ref 135–145)
Total Bilirubin: 0.4 mg/dL (ref 0.0–1.2)
Total Protein: 7.3 g/dL (ref 6.5–8.1)

## 2023-12-05 LAB — CBC
HCT: 38.3 % (ref 36.0–46.0)
Hemoglobin: 12.3 g/dL (ref 12.0–15.0)
MCH: 28.9 pg (ref 26.0–34.0)
MCHC: 32.1 g/dL (ref 30.0–36.0)
MCV: 89.9 fL (ref 80.0–100.0)
Platelets: 170 10*3/uL (ref 150–400)
RBC: 4.26 MIL/uL (ref 3.87–5.11)
RDW: 15.6 % — ABNORMAL HIGH (ref 11.5–15.5)
WBC: 6 10*3/uL (ref 4.0–10.5)
nRBC: 0 % (ref 0.0–0.2)

## 2023-12-05 LAB — CBG MONITORING, ED: Glucose-Capillary: 93 mg/dL (ref 70–99)

## 2023-12-05 NOTE — ED Notes (Signed)
 Patient transported to CT

## 2023-12-05 NOTE — ED Provider Notes (Signed)
 [place] EMERGENCY DEPARTMENT AT [facility] Provider Note   CSN: [id number] Arrival date & time: 12/05/23  2154     History No chief complaint on file.   Christine Christensen is a 78 y.o. female.  Patient past history significant for CHF, dementia, prior strokes, delirium presents ED today with concerns of altered mental status and increased combativeness.  Patient with baseline deficits to the bilateral lower extremities with inability to walk.  Patient reportedly had some difficulty with speech at around 8:30 PM and since that has not been able to speak.  Patient has history of similar symptoms when she has been admitted in the past for encephalopathy.  Patient is not answering any of my questions.  He is able to move her head and her neck but is unable to respond to any commands to move her arms or squeeze my fingers.  HPI     Home Medications Prior to Admission medications   Medication Sig Start Date End Date Taking? Authorizing Provider  acetaminophen (TYLENOL) 500 MG tablet Take 2 tablets (1,000 mg total) by mouth every 6 (six) hours as needed for mild pain, moderate pain, fever or headache. 06/25/21  Yes Regalado, Belkys A, MD  albuterol (VENTOLIN HFA) 108 (90 Base) MCG/ACT inhaler Inhale 2 puffs into the lungs every 4 (four) hours as needed for wheezing or shortness of breath. 11/04/21  Yes Leroy Sea, MD  aspirin 325 MG tablet Take 1 tablet (325 mg total) by mouth daily. 06/25/21  Yes Regalado, Belkys A, MD  busPIRone (BUSPAR) 5 MG tablet Take 5 mg by mouth 3 (three) times daily. 09/11/23  Yes [provider]  donepezil (ARICEPT) 5 MG tablet Take 5 mg by mouth in the morning and at bedtime. 09/27/23  Yes [provider]  escitalopram (LEXAPRO) 20 MG tablet Take 20 mg by mouth every morning.   Yes [provider]  LACTOBACILLUS PO Take 1 capsule by mouth in the morning and at bedtime.   Yes [provider]  melatonin 3 MG TABS tablet  Take 1 tablet (3 mg total) by mouth at bedtime. 06/25/21  Yes Regalado, Belkys A, MD  memantine (NAMENDA) 10 MG tablet Take 10 mg by mouth 2 (two) times daily. 10/04/23  Yes [provider]  Multiple Vitamin (MULTIVITAMIN WITH MINERALS) TABS tablet Take 1 tablet by mouth daily. 06/25/21  Yes Regalado, Belkys A, MD  polyethylene glycol (MIRALAX / GLYCOLAX) 17 g packet Take 17 g by mouth 2 (two) times daily. 06/25/21  Yes Regalado, Belkys A, MD  senna-docusate (SENOKOT-S) 8.6-50 MG tablet Take 1 tablet by mouth 2 (two) times daily. 06/25/21  Yes Regalado, Belkys A, MD  spironolactone (ALDACTONE) 25 MG tablet Take 1 tablet (25 mg total) by mouth daily. 11/09/23  Yes Zannie Cove, MD  torsemide (DEMADEX) 20 MG tablet Take 1 tablet (20 mg total) by mouth daily. 11/09/23  Yes Zannie Cove, MD      Allergies    Penicillins    Review of Systems   Review of Systems  Constitutional:  Positive for activity change.  Neurological:  Positive for weakness.  All other systems reviewed and are negative.   Physical Exam Updated Vital Signs BP 109/85 (BP Location: Right Arm)   Pulse 75   Temp 97.9 F (36.6 C) (Oral)   Resp 18   Ht 5\' 4"  (1.626 m)   Wt 58 kg   SpO2 100%   BMI 21.95 kg/m  Physical Exam Vitals and  nursing note reviewed.  Constitutional:      General: She is not in acute distress.    Appearance: Normal appearance. She is well-developed. She is not ill-appearing.  HENT:     Head: Normocephalic and atraumatic.  Eyes:     Conjunctiva/sclera: Conjunctivae normal.  Cardiovascular:     Rate and Rhythm: Normal rate and regular rhythm.     Heart sounds: No murmur heard. Pulmonary:     Effort: Pulmonary effort is normal. No respiratory distress.     Breath sounds: Normal breath sounds.  Abdominal:     Palpations: Abdomen is soft.     Tenderness: There is no abdominal tenderness.  Musculoskeletal:        General: No swelling.     Cervical back: Neck supple.  Skin:     General: Skin is warm and dry.     Capillary Refill: Capillary refill takes less than 2 seconds.  Neurological:     Mental Status: She is alert.     Comments: Unable to assess due current inability to follow commands. Has intact eye movement and can follow me in the room, but unable to squeeze hands or move arms. Legs baseline deficits with no movement present.  Psychiatric:        Mood and Affect: Mood normal.     ED Results / Procedures / Treatments   Labs (all labs ordered are listed, but only abnormal results are displayed) Labs Reviewed  URINE CULTURE - Abnormal; Notable for the following components:      Result Value   Culture   (*)    Value: >=100,000 COLONIES/mL ESCHERICHIA COLI Confirmed Extended Spectrum Beta-Lactamase Producer (ESBL).  In bloodstream infections from ESBL organisms, carbapenems are preferred over piperacillin/tazobactam. They are shown to have a lower risk of mortality.    Organism ID, Bacteria ESCHERICHIA COLI (*)    All other components within normal limits  COMPREHENSIVE METABOLIC PANEL - Abnormal; Notable for the following components:   Creatinine, Ser 1.17 (*)    GFR, Estimated 48 (*)    All other components within normal limits  CBC - Abnormal; Notable for the following components:   RDW 15.6 (*)    All other components within normal limits  URINALYSIS, ROUTINE W REFLEX MICROSCOPIC - Abnormal; Notable for the following components:   APPearance CLOUDY (*)    Nitrite POSITIVE (*)    Leukocytes,Ua LARGE (*)    Bacteria, UA FEW (*)    All other components within normal limits  CBC - Abnormal; Notable for the following components:   Hemoglobin 11.7 (*)    HCT 35.6 (*)    All other components within normal limits  BASIC METABOLIC PANEL - Abnormal; Notable for the following components:   Creatinine, Ser 1.06 (*)    GFR, Estimated 54 (*)    All other components within normal limits  COMPREHENSIVE METABOLIC PANEL - Abnormal; Notable for the following  components:   Creatinine, Ser 1.01 (*)    Total Protein 6.3 (*)    Albumin 3.1 (*)    GFR, Estimated 57 (*)    All other components within normal limits  CBC - Abnormal; Notable for the following components:   Hemoglobin 11.9 (*)    RDW 15.7 (*)    All other components within normal limits  RESP PANEL BY RT-PCR (RSV, FLU A&B, COVID)  RVPGX2  MAGNESIUM  PHOSPHORUS  MAGNESIUM  PHOSPHORUS  CBG MONITORING, ED    EKG EKG Interpretation Date/Time:  Wednesday December 05 2023 22:01:37 EDT Ventricular Rate:  56 PR Interval:  150 QRS Duration:  81 QT Interval:  490 QTC Calculation: 473 R Axis:   65  Text Interpretation: Sinus rhythm Minimal ST elevation, anterior leads No significant change was found Confirmed by Drema Pry [phone]) on 12/06/2023 12:33:55 AM  Radiology No results found.   Procedures Procedures    Medications Ordered in ED Medications  cefTRIAXone (ROCEPHIN) 1 g in sodium chloride 0.9 % 100 mL IVPB (0 g Intravenous Stopped 12/06/23 0141)  LORazepam (ATIVAN) injection 1 mg ( Intravenous Duplicate 12/06/23 0135)  fosfomycin (MONUROL) packet 3 g (3 g Oral Given 12/09/23 6045)    ED Course/ Medical Decision Making/ A&P Clinical Course as of 12/10/23 1402  Thu Dec 06, 2023  0107 Spoke with Dr. Margo Aye from Hospitalist service who will admit for further management. [PC]    Clinical Course User Index [PC] Cardama, Amadeo Garnet, MD                                 Medical Decision Making Amount and/or Complexity of Data Reviewed Labs: ordered. Radiology: ordered.  Risk Decision regarding hospitalization.   This patient presents to the ED for concern of altered mental status.  Differential diagnosis includes UTI, pneumonia, sepsis, and delirium presents to the ED today with concerns of altered mental status and increased combativeness.  Patient is a resident at   Lab Tests:  I Ordered, and personally interpreted labs.  The pertinent results include: CBC  with no leukocytosis, CMP unremarkable with exception of some elevation in creatinine indicating likely mild dehydration with no significant AKI, UA consistent with infection with nitrites, leukocytes and bacteria present, respiratory panel negative, urine culture collected and pending   Imaging Studies ordered:  I ordered imaging studies including chest x-ray, CT head I independently visualized and interpreted imaging which showed no active cardiopulmonary process, CT head negative for any acute findings I agree with the radiologist interpretation   Medicines ordered and prescription drug management:  I ordered medication including Rocephin for UTI Reevaluation of the patient after these medicines showed that the patient stayed the same I have reviewed the patients home medicines and have made adjustments as needed   Problem List / ED Course:  Patient with past history significant for CHF, dementia, prior strokes, delirium who presents ED today with concerns of altered mental status and increased combativeness.  Patient is a resident at [facility].  Reportedly patient began to have worsening altered mental status around 8:30 PM this evening.  Patient was having some difficulty with speaking and then began to no longer speak.  Patient at baseline is a medial from the waist down.  Patient at this time is unable to verbalize any specific concerns or complaints to me. Patient is able to follow and track me visually but unable to follow commands.  She is nonverbal at this time.  No focal abdominal tenderness on palpation of the abdomen.  No abnormal heart or lung sounds. Patient workup reveals evidence of suspected UTI with nitrites, leukocytes, few bacteria seen.  No evidence to suggest sepsis as patient has not acutely meeting SIRS criteria and white count is normal at 6.4.  Will initiate treatment for your suspected UTI with Rocephin.  Will plan on admission given patient's persistent altered  mental status. She has been able to speak slightly on reassessment but still not answering any questions. She will laugh  and shrug at any question asked at this time. Spoke with Dr. Margo Aye, hospitalist, who will be admitting patient.   Social Determinants of Health:  Resident at SNF History of dementia  Final Clinical Impression(s) / ED Diagnoses Final diagnoses:  Altered mental status, unspecified altered mental status type  Urinary tract infection without hematuria, site unspecified    Rx / DC Orders ED Discharge Orders          Ordered    Increase activity slowly        12/09/23 1028    Discharge instructions       Comments: 1)Please take your medications as instructed 2)Follow up with your PCP in a week   12/09/23 1028    Diet general       Comments: Dysphagia 3 diet   12/09/23 1028              Smitty Knudsen, PA-C 12/10/23 1402    Nira Conn, MD 12/14/23 [phone]

## 2023-12-05 NOTE — ED Provider Notes (Incomplete)
 [place] EMERGENCY DEPARTMENT AT [facility] Provider Note   CSN: [id number] Arrival date & time: 12/05/23  2154     History No chief complaint on file.   Christine Christensen is a 78 y.o. female.  Patient past history significant for CHF, dementia, prior strokes, dementia, delirium presents ED today with concerns of altered mental status and increased combativeness.  Patient with baseline deficits to the bilateral lower extremities with inability to walk.  Patient reportedly had some difficulty with speech at around 8:30 PM and since that has not been able to speak.  Patient has history of similar symptoms when she has been admitted in the past for encephalopathy.  Patient is not answering any of my questions.  He is able to move her head and her neck but is unable to respond to any commands to move her arms or squeeze my fingers.  HPI     Home Medications Prior to Admission medications   Medication Sig Start Date End Date Taking? Authorizing Provider  acetaminophen (TYLENOL) 500 MG tablet Take 2 tablets (1,000 mg total) by mouth every 6 (six) hours as needed for mild pain, moderate pain, fever or headache. 06/25/21   Regalado, Belkys A, MD  albuterol (VENTOLIN HFA) 108 (90 Base) MCG/ACT inhaler Inhale 2 puffs into the lungs every 4 (four) hours as needed for wheezing or shortness of breath. 11/04/21   Leroy Sea, MD  aspirin 325 MG tablet Take 1 tablet (325 mg total) by mouth daily. 06/25/21   Regalado, Belkys A, MD  busPIRone (BUSPAR) 5 MG tablet Take 5 mg by mouth 3 (three) times daily. 09/11/23   [provider]  donepezil (ARICEPT) 5 MG tablet Take 5 mg by mouth in the morning and at bedtime. 09/27/23   [provider]  escitalopram (LEXAPRO) 20 MG tablet Take 20 mg by mouth every morning.    [provider]  fexofenadine (ALLEGRA) 180 MG tablet Take 180 mg by mouth daily as needed for allergies or rhinitis.    [provider]   LACTOBACILLUS PO Take 1 capsule by mouth in the morning and at bedtime.    [provider]  melatonin 3 MG TABS tablet Take 1 tablet (3 mg total) by mouth at bedtime. 06/25/21   Regalado, Belkys A, MD  memantine (NAMENDA) 10 MG tablet Take 10 mg by mouth 2 (two) times daily. 10/04/23   [provider]  metoprolol tartrate (LOPRESSOR) 25 MG tablet Take 1 tablet (25 mg total) by mouth 2 (two) times daily. 11/08/23   Zannie Cove, MD  Multiple Vitamin (MULTIVITAMIN WITH MINERALS) TABS tablet Take 1 tablet by mouth daily. 06/25/21   Regalado, Belkys A, MD  polyethylene glycol (MIRALAX / GLYCOLAX) 17 g packet Take 17 g by mouth 2 (two) times daily. 06/25/21   Regalado, Belkys A, MD  senna-docusate (SENOKOT-S) 8.6-50 MG tablet Take 1 tablet by mouth 2 (two) times daily. 06/25/21   Regalado, Belkys A, MD  spironolactone (ALDACTONE) 25 MG tablet Take 1 tablet (25 mg total) by mouth daily. 11/09/23   Zannie Cove, MD  torsemide (DEMADEX) 20 MG tablet Take 1 tablet (20 mg total) by mouth daily. 11/09/23   Zannie Cove, MD      Allergies    Penicillins    Review of Systems   Review of Systems  Constitutional:  Positive for appetite change.  Neurological:  Positive for weakness.  All other systems reviewed and are negative.   Physical Exam Updated  Vital Signs BP 134/70   Pulse (!) 56   Temp 98 F (36.7 C) (Oral)   Resp (!) 8   Ht 5\' 4"  (1.626 m)   Wt 58.1 kg   SpO2 99%   BMI 21.97 kg/m  Physical Exam Vitals and nursing note reviewed.  Constitutional:      General: She is not in acute distress.    Appearance: Normal appearance. She is well-developed. She is not ill-appearing.  HENT:     Head: Normocephalic and atraumatic.  Eyes:     Conjunctiva/sclera: Conjunctivae normal.  Cardiovascular:     Rate and Rhythm: Normal rate and regular rhythm.     Heart sounds: No murmur heard. Pulmonary:     Effort: Pulmonary effort is normal. No respiratory distress.     Breath  sounds: Normal breath sounds.  Abdominal:     Palpations: Abdomen is soft.     Tenderness: There is no abdominal tenderness.  Musculoskeletal:        General: No swelling.     Cervical back: Neck supple.  Skin:    General: Skin is warm and dry.     Capillary Refill: Capillary refill takes less than 2 seconds.  Neurological:     Mental Status: She is alert.     Comments: Unable to assess due current inability to follow commands. Has intact eye movement and can follow me in the room, but unable to squeeze hands or move arms. Legs baseline deficits with no movement present.  Psychiatric:        Mood and Affect: Mood normal.    ED Results / Procedures / Treatments   Labs (all labs ordered are listed, but only abnormal results are displayed) Labs Reviewed  RESP PANEL BY RT-PCR (RSV, FLU A&B, COVID)  RVPGX2  COMPREHENSIVE METABOLIC PANEL  CBC  URINALYSIS, ROUTINE W REFLEX MICROSCOPIC  CBG MONITORING, ED    EKG None  Radiology No results found.  Procedures Procedures    Medications Ordered in ED Medications - No data to display  ED Course/ Medical Decision Making/ A&P                                 Medical Decision Making Amount and/or Complexity of Data Reviewed Labs: ordered. Radiology: ordered.   This patient presents to the ED for concern of altered mental status.  Differential diagnosis includes UTI, pneumonia, sepsis,   Lab Tests:  I Ordered, and personally interpreted labs.  The pertinent results include:  ***   Imaging Studies ordered:  I ordered imaging studies including chest x-ray, CT head I independently visualized and interpreted imaging which showed no active cardiopulmonary process,*** I agree with the radiologist interpretation   Medicines ordered and prescription drug management:  I ordered medication including ***  for ***  Reevaluation of the patient after these medicines showed that the patient  {resolved/improved/worsened:23923::"improved"} I have reviewed the patients home medicines and have made adjustments as needed   Problem List / ED Course:  ***   Social Determinants of Health:    Final Clinical Impression(s) / ED Diagnoses Final diagnoses:  None    Rx / DC Orders ED Discharge Orders     None

## 2023-12-05 NOTE — ED Triage Notes (Addendum)
 Pt BIB [facility] from [facility] for AMS and increased combativeness, pt has hx of dementia and stroke with deficits of inability to use bilateral legs, per EMS at 2030 pt began not being able to get her words out and a few mins later stopped talking, pt has not spoke since, v/s en route 117/62, HR 60, 99% RA, CBG 103, per facility staff pt has been similar in same with admission for encephalopathy

## 2023-12-06 DIAGNOSIS — Z8673 Personal history of transient ischemic attack (TIA), and cerebral infarction without residual deficits: Secondary | ICD-10-CM | POA: Diagnosis not present

## 2023-12-06 DIAGNOSIS — I5032 Chronic diastolic (congestive) heart failure: Secondary | ICD-10-CM | POA: Diagnosis present

## 2023-12-06 DIAGNOSIS — Z1612 Extended spectrum beta lactamase (ESBL) resistance: Secondary | ICD-10-CM | POA: Diagnosis present

## 2023-12-06 DIAGNOSIS — F0154 Vascular dementia, unspecified severity, with anxiety: Secondary | ICD-10-CM | POA: Diagnosis present

## 2023-12-06 DIAGNOSIS — Z8249 Family history of ischemic heart disease and other diseases of the circulatory system: Secondary | ICD-10-CM | POA: Diagnosis not present

## 2023-12-06 DIAGNOSIS — Z66 Do not resuscitate: Secondary | ICD-10-CM

## 2023-12-06 DIAGNOSIS — I110 Hypertensive heart disease with heart failure: Secondary | ICD-10-CM | POA: Diagnosis present

## 2023-12-06 DIAGNOSIS — Z7982 Long term (current) use of aspirin: Secondary | ICD-10-CM | POA: Diagnosis not present

## 2023-12-06 DIAGNOSIS — F17210 Nicotine dependence, cigarettes, uncomplicated: Secondary | ICD-10-CM | POA: Diagnosis present

## 2023-12-06 DIAGNOSIS — F32A Depression, unspecified: Secondary | ICD-10-CM | POA: Diagnosis present

## 2023-12-06 DIAGNOSIS — Z515 Encounter for palliative care: Secondary | ICD-10-CM | POA: Diagnosis not present

## 2023-12-06 DIAGNOSIS — R4182 Altered mental status, unspecified: Principal | ICD-10-CM | POA: Diagnosis present

## 2023-12-06 DIAGNOSIS — I080 Rheumatic disorders of both mitral and aortic valves: Secondary | ICD-10-CM | POA: Diagnosis present

## 2023-12-06 DIAGNOSIS — Z993 Dependence on wheelchair: Secondary | ICD-10-CM | POA: Diagnosis not present

## 2023-12-06 DIAGNOSIS — Z96653 Presence of artificial knee joint, bilateral: Secondary | ICD-10-CM | POA: Diagnosis present

## 2023-12-06 DIAGNOSIS — F03918 Unspecified dementia, unspecified severity, with other behavioral disturbance: Secondary | ICD-10-CM

## 2023-12-06 DIAGNOSIS — G9341 Metabolic encephalopathy: Secondary | ICD-10-CM | POA: Diagnosis present

## 2023-12-06 DIAGNOSIS — Z880 Allergy status to penicillin: Secondary | ICD-10-CM | POA: Diagnosis not present

## 2023-12-06 DIAGNOSIS — Z1152 Encounter for screening for COVID-19: Secondary | ICD-10-CM | POA: Diagnosis not present

## 2023-12-06 DIAGNOSIS — R404 Transient alteration of awareness: Secondary | ICD-10-CM | POA: Diagnosis not present

## 2023-12-06 DIAGNOSIS — F01518 Vascular dementia, unspecified severity, with other behavioral disturbance: Secondary | ICD-10-CM | POA: Diagnosis present

## 2023-12-06 DIAGNOSIS — Z7189 Other specified counseling: Secondary | ICD-10-CM | POA: Diagnosis not present

## 2023-12-06 DIAGNOSIS — Z96641 Presence of right artificial hip joint: Secondary | ICD-10-CM | POA: Diagnosis present

## 2023-12-06 DIAGNOSIS — F0153 Vascular dementia, unspecified severity, with mood disturbance: Secondary | ICD-10-CM | POA: Diagnosis present

## 2023-12-06 DIAGNOSIS — Z781 Physical restraint status: Secondary | ICD-10-CM | POA: Diagnosis not present

## 2023-12-06 DIAGNOSIS — Z820 Family history of epilepsy and other diseases of the nervous system: Secondary | ICD-10-CM | POA: Diagnosis not present

## 2023-12-06 DIAGNOSIS — Z79899 Other long term (current) drug therapy: Secondary | ICD-10-CM | POA: Diagnosis not present

## 2023-12-06 DIAGNOSIS — B9620 Unspecified Escherichia coli [E. coli] as the cause of diseases classified elsewhere: Secondary | ICD-10-CM | POA: Diagnosis present

## 2023-12-06 DIAGNOSIS — N390 Urinary tract infection, site not specified: Secondary | ICD-10-CM | POA: Diagnosis present

## 2023-12-06 LAB — URINALYSIS, ROUTINE W REFLEX MICROSCOPIC
Bilirubin Urine: NEGATIVE
Glucose, UA: NEGATIVE mg/dL
Hgb urine dipstick: NEGATIVE
Ketones, ur: NEGATIVE mg/dL
Nitrite: POSITIVE — AB
Protein, ur: NEGATIVE mg/dL
Specific Gravity, Urine: 1.015 (ref 1.005–1.030)
WBC, UA: >50 WBC/hpf (ref 0–5)
pH: 5 (ref 5.0–8.0)

## 2023-12-06 LAB — CBC
HCT: 35.6 % — ABNORMAL LOW (ref 36.0–46.0)
Hemoglobin: 11.7 g/dL — ABNORMAL LOW (ref 12.0–15.0)
MCH: 29.2 pg (ref 26.0–34.0)
MCHC: 32.9 g/dL (ref 30.0–36.0)
MCV: 88.8 fL (ref 80.0–100.0)
Platelets: 169 10*3/uL (ref 150–400)
RBC: 4.01 MIL/uL (ref 3.87–5.11)
RDW: 15.5 % (ref 11.5–15.5)
WBC: 6.4 10*3/uL (ref 4.0–10.5)
nRBC: 0 % (ref 0.0–0.2)

## 2023-12-06 LAB — BASIC METABOLIC PANEL
Anion gap: 10 (ref 5–15)
BUN: 16 mg/dL (ref 8–23)
CO2: 28 mmol/L (ref 22–32)
Calcium: 9.2 mg/dL (ref 8.9–10.3)
Chloride: 103 mmol/L (ref 98–111)
Creatinine, Ser: 1.06 mg/dL — ABNORMAL HIGH (ref 0.44–1.00)
GFR, Estimated: 54 mL/min — ABNORMAL LOW (ref >60)
Glucose, Bld: 94 mg/dL (ref 70–99)
Potassium: 3.6 mmol/L (ref 3.5–5.1)
Sodium: 141 mmol/L (ref 135–145)

## 2023-12-06 LAB — PHOSPHORUS: Phosphorus: 3 mg/dL (ref 2.5–4.6)

## 2023-12-06 LAB — RESP PANEL BY RT-PCR (RSV, FLU A&B, COVID)  RVPGX2
Influenza A by PCR: NEGATIVE
Influenza B by PCR: NEGATIVE
Resp Syncytial Virus by PCR: NEGATIVE
SARS Coronavirus 2 by RT PCR: NEGATIVE

## 2023-12-06 LAB — MAGNESIUM: Magnesium: 1.9 mg/dL (ref 1.7–2.4)

## 2023-12-06 MED ORDER — LORAZEPAM 2 MG/ML IJ SOLN
1.0000 mg | INTRAMUSCULAR | Status: AC
  Administered 2023-12-06: 1 mg via INTRAVENOUS

## 2023-12-06 MED ORDER — MELATONIN 5 MG PO TABS: 5.0000 mg | ORAL_TABLET | Freq: Every evening | ORAL | Status: DC | PRN

## 2023-12-06 MED ORDER — ASPIRIN 325 MG PO TABS
325.0000 mg | ORAL_TABLET | Freq: Every day | ORAL | Status: DC
  Administered 2023-12-09: 325 mg via ORAL
  Filled 2023-12-06 (×2): qty 1

## 2023-12-06 MED ORDER — HALOPERIDOL LACTATE 5 MG/ML IJ SOLN
2.0000 mg | Freq: Four times a day (QID) | INTRAMUSCULAR | Status: DC | PRN
  Filled 2023-12-06: qty 1

## 2023-12-06 MED ORDER — SODIUM CHLORIDE 0.9 % IV SOLN: 1.0000 g | INTRAVENOUS | Status: DC

## 2023-12-06 MED ORDER — PROCHLORPERAZINE EDISYLATE 10 MG/2ML IJ SOLN: 5.0000 mg | Freq: Four times a day (QID) | INTRAMUSCULAR | Status: DC | PRN

## 2023-12-06 MED ORDER — ACETAMINOPHEN 325 MG PO TABS
650.0000 mg | ORAL_TABLET | Freq: Four times a day (QID) | ORAL | Status: DC | PRN
Start: 2023-12-06 — End: 2023-12-09

## 2023-12-06 MED ORDER — TORSEMIDE 20 MG PO TABS
20.0000 mg | ORAL_TABLET | Freq: Every day | ORAL | Status: DC
  Administered 2023-12-09: 20 mg via ORAL
  Filled 2023-12-06 (×2): qty 1

## 2023-12-06 MED ORDER — LORAZEPAM 2 MG/ML IJ SOLN
INTRAMUSCULAR | Status: AC
  Filled 2023-12-06: qty 1

## 2023-12-06 MED ORDER — BUSPIRONE HCL 5 MG PO TABS
5.0000 mg | ORAL_TABLET | Freq: Three times a day (TID) | ORAL | Status: DC
  Administered 2023-12-08 – 2023-12-09 (×2): 5 mg via ORAL
  Filled 2023-12-06 (×3): qty 1

## 2023-12-06 MED ORDER — SODIUM CHLORIDE 0.9 % IV SOLN
1.0000 g | Freq: Two times a day (BID) | INTRAVENOUS | Status: DC
  Administered 2023-12-06 – 2023-12-09 (×7): 1 g via INTRAVENOUS
  Filled 2023-12-06 (×10): qty 20

## 2023-12-06 MED ORDER — SPIRONOLACTONE 25 MG PO TABS
25.0000 mg | ORAL_TABLET | Freq: Every day | ORAL | Status: DC
Start: 2023-12-06 — End: 2023-12-09
  Administered 2023-12-09: 25 mg via ORAL
  Filled 2023-12-06 (×2): qty 1

## 2023-12-06 MED ORDER — ESCITALOPRAM OXALATE 20 MG PO TABS
20.0000 mg | ORAL_TABLET | Freq: Every morning | ORAL | Status: DC
Start: 2023-12-06 — End: 2023-12-09
  Administered 2023-12-09: 20 mg via ORAL
  Filled 2023-12-06: qty 1
  Filled 2023-12-06: qty 2

## 2023-12-06 MED ORDER — ENOXAPARIN SODIUM 40 MG/0.4ML IJ SOSY
40.0000 mg | PREFILLED_SYRINGE | INTRAMUSCULAR | Status: DC
  Administered 2023-12-06 – 2023-12-09 (×2): 40 mg via SUBCUTANEOUS
  Filled 2023-12-06 (×4): qty 0.4

## 2023-12-06 MED ORDER — SODIUM CHLORIDE 0.9 % IV SOLN
1.0000 g | Freq: Once | INTRAVENOUS | Status: AC
  Administered 2023-12-06: 1 g via INTRAVENOUS
  Filled 2023-12-06: qty 10

## 2023-12-06 MED ORDER — POLYETHYLENE GLYCOL 3350 17 G PO PACK: 17.0000 g | PACK | Freq: Every day | ORAL | Status: DC | PRN

## 2023-12-06 NOTE — Progress Notes (Signed)
 Unable to complete admission assessment at this time as patient does not answer questions.  Patient appears to be alert and oriented x0. Does not follow commands.

## 2023-12-06 NOTE — Progress Notes (Signed)
   12/06/23 1400  Provider Notification  Provider Name/Title Dr. Lafe Garin Attending  Date Provider Notified 12/06/23  Time Provider Notified 1400  Method of Notification Rounds  Notification Reason Other (Comment) (No restraints applied)     Orders received for restraints from Dr. Lafe Garin, after giving patient some time to settle, no longer combative, verbally aggressive or cursing at staff.  No longer attempting to punch at staff as long as she is not touched.  Dr. Lafe Garin rounding, made him aware that restraints were not applied due to patient no longer aggressive.

## 2023-12-06 NOTE — Consult Note (Signed)
 Consultation Note Date: 12/06/2023   Patient Name: Christine Christensen  DOB: 1946-04-05  MRN: [id number]  Age / Sex: 78 y.o., female  PCP: Fleet Contras, MD Referring Physician: Leatha Gilding, MD  Reason for Consultation: Establishing goals of care  HPI/Patient Profile: 78 y.o. female  with past medical history of vascular dementia, CVA, severe mitral stenosis, chronic HFpEF, physical debility, wheelchair-bound at baseline, hypertension, history of ESBL E. coli UTI admitted on 12/05/2023 with AMS.  Patient is being treated for UTI.  PMT consulted to assist with goals of care.  Clinical Assessment and Goals of Care: I have reviewed medical records including EPIC notes, labs and imaging, assessed the patient and then spoke with patient's legal guardian through [facility] DSS, Christine Christensen, to discuss diagnosis prognosis, GOC, EOL wishes, disposition and options.  I introduced Palliative Medicine as specialized medical care for people living with serious illness. It focuses on providing relief from the symptoms and stress of a serious illness. The goal is to improve quality of life for both the patient and the family.  Christine Christensen shares that DSS is a Clinical research associate for [name].  She shares that patient's son is involved and is allowed to receive medical updates however he is not allowed to make medical decisions.  Christine Christensen shares that she visited the patient last week at her facility.  She shares the patient is wheelchair-bound but is social with other residents.  She shares about her cognitive decline.   We discussed patient's current illness and what it means in the larger context of patient's on-going co-morbidities.  We discussed her dementia as well as her current treatment for UTI.  I attempted to elicit values and goals of care important to the patient.    We discussed CODE STATUS and guardian shares that DNR/DNI has already  been established for patient.  She shares it is on file at patient's nursing facility and she agrees to change CODE STATUS here in the hospital to DNR/DNI.  Later obtained documentation of DNR from patient's nursing facility.  It was scanned into epic as well as placed on paper chart.  Questions and concerns were addressed.   Primary Decision Maker OTHER [facility] DSS    SUMMARY OF RECOMMENDATIONS   CODE STATUS changed to DNR/DNI Continue current treatment     Primary Diagnoses: Present on Admission:  AMS (altered mental status)   I have reviewed the medical record, interviewed the patient and family, and examined the patient. The following aspects are pertinent.  Past Medical History:  Diagnosis Date   Anemia    history of anemia   Aortic stenosis    mild aortic stenosis 07/27/17 echo   Arthritis    Heart murmur    Hypertension    Mitral stenosis    moderate mitral stenosis 07/27/17 echo   Stroke [facility])    stroke x2, 2005 and 2008, some right sided weakness   Syncope and collapse 10/25/2017   Urinary incontinence    Social History   Socioeconomic History   Marital status: Divorced    Spouse name: Not on file   Number of children: 2   Years of education: 14   Highest education level: Not on file  Occupational History   Not on file  Tobacco Use   Smoking status: Former    Current packs/day: 0.50    Average packs/day: 0.5 packs/day for 56.0 years (28.0 ttl pk-yrs)    Types: Cigarettes   Smokeless tobacco: Never  Vaping Use   Vaping status: Never Used  Substance and Sexual Activity   Alcohol use: Not Currently    Comment: Sometimes   Drug use: Not Currently    Types: Marijuana   Sexual activity: Not on file  Other Topics Concern   Not on file  Social History Narrative   Lives with son   Caffeine use: Drinks coffee 3 times weekly, Iced tea sometimes   Right handed    Social Drivers of Corporate investment banker Strain: Not on file  Food  Insecurity: No Food Insecurity (11/01/2023)   Hunger Vital Sign    Worried About Running Out of Food in the Last Year: Never true    Ran Out of Food in the Last Year: Never true  Transportation Needs: No Transportation Needs (11/01/2023)   PRAPARE - Administrator, Civil Service (Medical): No    Lack of Transportation (Non-Medical): No  Physical Activity: Not on file  Stress: Not on file  Social Connections: Patient Unable To Answer (11/01/2023)   Social Connection and Isolation Panel [NHANES]    Frequency of Communication with Friends and Family: Patient unable to answer    Frequency of Social Gatherings with Friends and Family: Patient unable to answer    Attends Religious Services: Patient unable to answer    Active Member of Clubs or Organizations: Patient unable to answer    Attends Banker Meetings: Patient unable to answer    Marital Status: Patient unable to answer   Family History  Problem Relation Age of Onset   Dementia Mother    Heart disease Father    Cerebral palsy Sister    Scheduled Meds:  aspirin  325 mg Oral Daily   busPIRone  5 mg Oral TID   enoxaparin (LOVENOX) injection  40 mg Subcutaneous Q24H   escitalopram  20 mg Oral q morning   spironolactone  25 mg Oral Daily   torsemide  20 mg Oral Daily   Continuous Infusions:  meropenem (MERREM) IV Stopped (12/06/23 0257)   PRN Meds:.acetaminophen, haloperidol lactate, melatonin, polyethylene glycol, prochlorperazine Allergies  Allergen Reactions   Penicillins Other (See Comments)    Unknown reaction   Review of Systems  Unable to perform ROS: Dementia    Physical Exam Constitutional:      General: She is not in acute distress.    Comments: Sleeping soundly following Ativan administration, does not wake to voice or gentle touch, was combative earlier so did not overly stimulate  Pulmonary:     Effort: Pulmonary effort is normal. No respiratory distress.  Skin:    General: Skin is  warm and dry.     Vital Signs: BP 96/60 (BP Location: Right Arm)   Pulse 64   Temp 98 F (36.7 C) (Axillary)   Resp 16   Ht 5\' 4"  (1.626 m)   Wt 58.1 kg   SpO2 100%   BMI 21.97 kg/m  Pain Scale: PAINAD       SpO2: SpO2:  (refused) O2 Device:SpO2:  (refused) O2 Flow Rate: .   IO: Intake/output summary: No intake or output data in the 24 hours ending 12/06/23 1334  LBM:   Baseline Weight: Weight: 58.1 kg Most recent weight: Weight: 58.1 kg     *Please note that this is a verbal dictation therefore any spelling or grammatical errors are due to the "Dragon Medical One" system interpretation.   Time Total: 50 minutes Time spent includes: Detailed review of medical  records (labs, imaging, vital signs), medically appropriate exam, discussion with treatment team, counseling and educating patient, family and/or staff, documenting clinical information, medication management and coordination of care.    Gerlean Ren, DNP, AGNP-C Palliative Medicine Team [phone] Pager: [phone]

## 2023-12-06 NOTE — Progress Notes (Signed)
 Arrived to [facility] 46M Room 46M 18 from Emergency Department.  Patient is aggressive verbally, punching at staff, cursing at staff.  Refused telemetry monitoring, refused pulse-ox for oxygen saturation.  Dr. Lafe Garin made aware of all refusals.     12/06/23 1119 12/06/23 1200  Vitals  Temp 98 F (36.7 C)  --   Temp Source Axillary  --   BP 96/60  --   BP Location Right Arm  --   BP Method Automatic  --   Patient Position (if appropriate) Lying  --   Pulse Rate 64  --   Pulse Rate Source Dinamap  --   ECG Heart Rate  (refused)  --   Resp 16  --   MEWS COLOR  MEWS Score Color Green  --   Oxygen Therapy  SpO2  (refused)  --   O2 Device Room Air  --   MEWS Score  MEWS Temp 0  --   MEWS Systolic 1  --   MEWS Pulse 0  --   MEWS RR 0  --   MEWS LOC 0  --   MEWS Score 1  --   Provider Notification  Provider Name/Title  --  Dr. Lafe Garin /Attending  Date Provider Notified  --  12/06/23  Time Provider Notified  --  1208  Method of Notification  --   (Secure Chat)  Notification Reason  --  Medical equipment refusal  Medical equipment refused/Pt. educated regarding refusal  --  Telemetry / Continuous monitoring  Provider response  --  Evaluate remotely;See new orders  Date of Provider Response  --  12/06/23  Time of Provider Response  --  1210

## 2023-12-06 NOTE — ED Notes (Signed)
 Attempted to give patient 10am medications. Patient will keep her mouth clamped down and is refusing to take them. She is able to take sips of water but will not open her mouth wide enough to take her medications.

## 2023-12-06 NOTE — Progress Notes (Signed)
 No charge note  Patient seen and evaluated this morning, admitted overnight, H&P reviewed and I agree with the assessment and plan.  In brief, this is a 78 year old female with history of advanced dementia, prior CVA, chronic diastolic CHF, physical debility, wheelchair-bound at baseline, living in a SNF, history of ESBL UTI as well as aspiration pneumonia comes into the hospital due to being diagnosed.  In the ER confusion and combative UA showed evidence of a UTI, she was started on antibiotics and admitted to the hospital  Will continue admission for acute metabolic encephalopathy suspected GI, given history of ESBL she will be covered with Carbapenem, follow urine cultures.  She has poor functional status with prior CVAs, dementia, and will consult palliative.  She is a ward of the state  Idonna Heeren M. Elvera Lennox, MD, PhD [facility]  Between 7 am - 7 pm you can contact me via Amion (for emergencies) or Securechat (non urgent matters).  I am not available 7 pm - 7 am, please contact night coverage MD/APP via Amion

## 2023-12-06 NOTE — Progress Notes (Signed)
 Pharmacy Antibiotic Note  Christine Christensen is a 78 y.o. female admitted on 12/05/2023 with UTI.  Pharmacy has been consulted for merrem dosing.  Plan: Merrem 1g q12h  F/u renal function, infectious work up and length of therapy  Height: 5\' 4"  (162.6 cm) Weight: 58.1 kg (128 lb) IBW/kg (Calculated) : 54.7  Temp (24hrs), Avg:98 F (36.7 C), Min:98 F (36.7 C), Max:98 F (36.7 C)  Recent Labs  Lab 12/05/23 1959  WBC 6.0  CREATININE 1.17*    Estimated Creatinine Clearance: 34.2 mL/min (A) (by C-G formula based on SCr of 1.17 mg/dL (H)).    Allergies  Allergen Reactions   Penicillins Other (See Comments)    Unknown reaction   Thank you for allowing pharmacy to be a part of this patient's care.  Marja Kays 12/06/2023 1:43 AM

## 2023-12-06 NOTE — ED Notes (Signed)
 Pt pulled out her IV. I was able to restart IV, as I was finishing pt grabbed me and tried to bite my arm, started cursing. Attempted mittens, pt continued to be combative, biting at mittens and coban wrapping the IV, attempting to hit staff and kick staff, attempting to bite staff, cursing and yelling. Unable to reorient. Dr. Margo Aye notified of the same, came to bedside to assess pt, order for ativan and soft restraints. Will continue to assess.

## 2023-12-06 NOTE — H&P (Addendum)
 History and Physical  Christine Christensen:[id number] DOB: [birth year] DOA: 12/05/2023  Referring physician: Dr. Eudelia Bunch, EDP  PCP: Fleet Contras, MD  Outpatient Specialists: None Patient coming from: SNF  Chief Complaint: Altered mental status.  HPI: Christine Christensen is a 78 y.o. female with medical history significant for vascular dementia, CVA, severe mitral stenosis, chronic HFpEF, physical debility, wheelchair-bound at baseline, hypertension, history of ESBL E. coli UTI, who presents to the ER via EMS from SNF due to altered mental status and combativeness that started tonight.  Per EMS, at 2030 p.m. the patient had trouble getting her words out.  A few minutes later, she stopped talking.  In the ER, initially nonverbal.  Later she became verbally aggressive, very agitated, and very combative.  Hitting nursing staff and interfering with her care.  Moving all 4 limbs and requiring 4 point soft restraints for her own safety.  UA positive for pyuria, nitrite positive.  The patient received 1 dose of Rocephin.  TRH, hospitalist service, was asked to admit.  ED Course: Temperature 98.  BP 137/61, pulse 72, respiratory 14, O2 saturation of 100% on room air.  WBC 6.0, hemoglobin 12.3, platelet count 170.  BUN 15, creatinine 1.17.  GFR 48.  Review of Systems: Review of systems as noted in the HPI. All other systems reviewed and are negative.   Past Medical History:  Diagnosis Date   Anemia    history of anemia   Aortic stenosis    mild aortic stenosis 07/27/17 echo   Arthritis    Heart murmur    Hypertension    Mitral stenosis    moderate mitral stenosis 07/27/17 echo   Stroke [facility])    stroke x2, 2005 and 2008, some right sided weakness   Syncope and collapse 10/25/2017   Urinary incontinence    Past Surgical History:  Procedure Laterality Date   CATARACT EXTRACTION, BILATERAL Bilateral    CHOLECYSTECTOMY     HIP ARTHROPLASTY Right 06/14/2021   Procedure: ARTHROPLASTY BIPOLAR HIP  (HEMIARTHROPLASTY);  Surgeon: Joen Laura, MD;  Location: [facility] OR;  Service: Orthopedics;  Laterality: Right;   TONSILLECTOMY     TOTAL KNEE ARTHROPLASTY Right    right knee 2006   TOTAL KNEE ARTHROPLASTY Left 08/06/2017   Procedure: LEFT TOTAL KNEE ARTHROPLASTY;  Surgeon: Dannielle Huh, MD;  Location: [facility] OR;  Service: Orthopedics;  Laterality: Left;   TUBAL LIGATION      Social History:  reports that she has quit smoking. Her smoking use included cigarettes. She has a 28 pack-year smoking history. She has never used smokeless tobacco. She reports that she does not currently use alcohol. She reports that she does not currently use drugs after having used the following drugs: Marijuana.   Allergies  Allergen Reactions   Penicillins Other (See Comments)    Unknown reaction    Family History  Problem Relation Age of Onset   Dementia Mother    Heart disease Father    Cerebral palsy Sister       Prior to Admission medications   Medication Sig Start Date End Date Taking? Authorizing Provider  acetaminophen (TYLENOL) 500 MG tablet Take 2 tablets (1,000 mg total) by mouth every 6 (six) hours as needed for mild pain, moderate pain, fever or headache. 06/25/21   Regalado, Belkys A, MD  albuterol (VENTOLIN HFA) 108 (90 Base) MCG/ACT inhaler Inhale 2 puffs into the lungs every 4 (four) hours as needed for wheezing or shortness of breath. 11/04/21  Leroy Sea, MD  aspirin 325 MG tablet Take 1 tablet (325 mg total) by mouth daily. 06/25/21   Regalado, Belkys A, MD  busPIRone (BUSPAR) 5 MG tablet Take 5 mg by mouth 3 (three) times daily. 09/11/23   [provider]  donepezil (ARICEPT) 5 MG tablet Take 5 mg by mouth in the morning and at bedtime. 09/27/23   [provider]  escitalopram (LEXAPRO) 20 MG tablet Take 20 mg by mouth every morning.    [provider]  fexofenadine (ALLEGRA) 180 MG tablet Take 180 mg by mouth daily as needed for allergies or rhinitis.     [provider]  LACTOBACILLUS PO Take 1 capsule by mouth in the morning and at bedtime.    [provider]  melatonin 3 MG TABS tablet Take 1 tablet (3 mg total) by mouth at bedtime. 06/25/21   Regalado, Belkys A, MD  memantine (NAMENDA) 10 MG tablet Take 10 mg by mouth 2 (two) times daily. 10/04/23   [provider]  metoprolol tartrate (LOPRESSOR) 25 MG tablet Take 1 tablet (25 mg total) by mouth 2 (two) times daily. 11/08/23   Zannie Cove, MD  Multiple Vitamin (MULTIVITAMIN WITH MINERALS) TABS tablet Take 1 tablet by mouth daily. 06/25/21   Regalado, Belkys A, MD  polyethylene glycol (MIRALAX / GLYCOLAX) 17 g packet Take 17 g by mouth 2 (two) times daily. 06/25/21   Regalado, Belkys A, MD  senna-docusate (SENOKOT-S) 8.6-50 MG tablet Take 1 tablet by mouth 2 (two) times daily. 06/25/21   Regalado, Belkys A, MD  spironolactone (ALDACTONE) 25 MG tablet Take 1 tablet (25 mg total) by mouth daily. 11/09/23   Zannie Cove, MD  torsemide (DEMADEX) 20 MG tablet Take 1 tablet (20 mg total) by mouth daily. 11/09/23   Zannie Cove, MD    Physical Exam: BP 137/61   Pulse 72   Temp 98 F (36.7 C) (Oral)   Resp 14   Ht 5\' 4"  (1.626 m)   Wt 58.1 kg   SpO2 100%   BMI 21.97 kg/m   General: 78 y.o. year-old female well developed well nourished in no acute distress.  Alert, confused, agitated, and combative. Cardiovascular: Regular rate and rhythm with no rubs or gallops.  No thyromegaly or JVD noted.  No lower extremity edema bilaterally. Respiratory: Clear to auscultation with no wheezes or rales.  Poor inspiratory effort. Abdomen: Soft nontender nondistended with normal bowel sounds x4 quadrants. Muskuloskeletal: No cyanosis, clubbing or edema noted bilaterally Neuro: CN II-XII intact, strength, sensation, reflexes Skin: No ulcerative lesions noted or rashes on limited exam. Psychiatry: Judgement and insight appear altered. Mood is anxious and combative.          Labs on Admission:  Basic Metabolic Panel: Recent Labs  Lab 12/05/23 1959  NA 141  K 3.9  CL 102  CO2 32  GLUCOSE 96  BUN 15  CREATININE 1.17*  CALCIUM 9.3   Liver Function Tests: Recent Labs  Lab 12/05/23 1959  AST 20  ALT 13  ALKPHOS 55  BILITOT 0.4  PROT 7.3  ALBUMIN 3.7   No results for input(s): "LIPASE", "AMYLASE" in the last 168 hours. No results for input(s): "AMMONIA" in the last 168 hours. CBC: Recent Labs  Lab 12/05/23 1959  WBC 6.0  HGB 12.3  HCT 38.3  MCV 89.9  PLT 170   Cardiac Enzymes: No results for input(s): "CKTOTAL", "CKMB", "CKMBINDEX", "TROPONINI" in the last 168 hours.  BNP (last 3 results)  No results for input(s): "BNP" in the last 8760 hours.  ProBNP (last 3 results) No results for input(s): "PROBNP" in the last 8760 hours.  CBG: Recent Labs  Lab 12/05/23 2313  GLUCAP 93    Radiological Exams on Admission: CT Head Wo Contrast Result Date: 12/05/2023 CLINICAL DATA:  Mental status change, unknown cause EXAM: CT HEAD WITHOUT CONTRAST TECHNIQUE: Contiguous axial images were obtained from the base of the skull through the vertex without intravenous contrast. RADIATION DOSE REDUCTION: This exam was performed according to the departmental dose-optimization program which includes automated exposure control, adjustment of the mA and/or kV according to patient size and/or use of iterative reconstruction technique. COMPARISON:  MRI and CT head 10/12/2023 FINDINGS: Brain: No intracranial hemorrhage, mass effect, or evidence of acute infarct. No hydrocephalus. No extra-axial fluid collection. Generalized cerebral atrophy and chronic small vessel ischemic disease. Chronic cerebellar and basal ganglia lacunar infarcts. Vascular: No hyperdense vessel. Intracranial arterial calcification. Skull: No fracture or focal lesion. Sinuses/Orbits: No acute finding. Other: None. IMPRESSION: No acute intracranial abnormality. Electronically Signed   By: Minerva Fester M.D.   On: 12/05/2023 23:40   DG Chest Portable 1 View Result Date: 12/05/2023 CLINICAL DATA:  Altered mental status. EXAM: PORTABLE CHEST 1 VIEW COMPARISON:  November 05, 2023 FINDINGS: The heart size and mediastinal contours are within normal limits. There is moderate severity calcification of the aortic arch. There is no evidence of an acute infiltrate, pleural effusion or pneumothorax. Radiopaque surgical clips are seen overlying the right upper quadrant. Multilevel degenerative changes are noted throughout the thoracic spine. IMPRESSION: No active cardiopulmonary disease. Electronically Signed   By: Aram Candela M.D.   On: 12/05/2023 22:51    EKG: I independently viewed the EKG done and my findings are as followed: Sinus rhythm rate of 56.  Nonspecific ST-T changes.  QTC 473.  Assessment/Plan Present on Admission:  AMS (altered mental status)  Principal Problem:   AMS (altered mental status)  Acute metabolic encephalopathy, suspect secondary to presumptive UTI Treat underlying condition UA positive for pyuria, nitrite positive Received 1 dose of Rocephin in the ER. History of ESBL E. coli UTI 10/13/2023 Will cover with Merrem, follow urine culture for ID and sensitivities De-escalate IV antibiotics as able. Reorient as needed Fall, aspiration, and delirium precautions in place.  Presumptive UTI with recent history of ESBL E. coli UTI, POA Management as stated above Contact precaution due to prior ESBL infection.  Combativeness History of CVAs Chronic anxiety/depression 1 dose of IV Ativan 1 mg x 1 administered in the ER due to anxiety and combativeness 4 point soft restraints applied for the patient's own safety x 1 day with frequent reassessment. Resume home escitalopram and BuSpar  Questionable aphasia, resolved No acute intracranial abnormality from noncontrast head CT done on 12/05/2023.  Essential hypertension BP is currently soft Hold off home oral  antihypertensives to avoid hypotension Closely monitor vital signs  Severe mitral stenosis Chronic HFpEF Euvolemic on exam Resume home regimen Monitor volume status  History of CVA Resume home aspirin Fall precautions   Time: 75 minutes.   DVT prophylaxis: Subcu Lovenox daily  Code Status: Full code  Family Communication: None at bedside  Disposition Plan: Admitted to telemetry medical unit  Consults called: None.  Admission status: Observation status.   Status is: Observation    Darlin Drop MD [facility] Pager [phone]  If 7PM-7AM, please contact night-coverage [email or web address] Password [email or web address]  12/06/2023, 1:11 AM

## 2023-12-06 NOTE — Plan of Care (Signed)
 Alert, oriented x0. Aggressive with staff when attempting to provide care.   Problem: Clinical Measurements: Goal: Ability to maintain clinical measurements within normal limits will improve Outcome: Not Progressing Goal: Will remain free from infection Outcome: Not Progressing     Problem: Education: Goal: Knowledge of General Education information will improve Description: Including pain rating scale, medication(s)/side effects and non-pharmacologic comfort measures Outcome: Not Progressing   Problem: Health Behavior/Discharge Planning: Goal: Ability to manage health-related needs will improve Outcome: Not Progressing

## 2023-12-07 DIAGNOSIS — R404 Transient alteration of awareness: Secondary | ICD-10-CM | POA: Diagnosis not present

## 2023-12-07 LAB — COMPREHENSIVE METABOLIC PANEL
ALT: 11 U/L (ref 0–44)
AST: 16 U/L (ref 15–41)
Albumin: 3.1 g/dL — ABNORMAL LOW (ref 3.5–5.0)
Alkaline Phosphatase: 49 U/L (ref 38–126)
Anion gap: 13 (ref 5–15)
BUN: 14 mg/dL (ref 8–23)
CO2: 26 mmol/L (ref 22–32)
Calcium: 9.2 mg/dL (ref 8.9–10.3)
Chloride: 103 mmol/L (ref 98–111)
Creatinine, Ser: 1.01 mg/dL — ABNORMAL HIGH (ref 0.44–1.00)
GFR, Estimated: 57 mL/min — ABNORMAL LOW (ref >60)
Glucose, Bld: 83 mg/dL (ref 70–99)
Potassium: 3.7 mmol/L (ref 3.5–5.1)
Sodium: 142 mmol/L (ref 135–145)
Total Bilirubin: 0.8 mg/dL (ref 0.0–1.2)
Total Protein: 6.3 g/dL — ABNORMAL LOW (ref 6.5–8.1)

## 2023-12-07 LAB — CBC
HCT: 36.4 % (ref 36.0–46.0)
Hemoglobin: 11.9 g/dL — ABNORMAL LOW (ref 12.0–15.0)
MCH: 28.7 pg (ref 26.0–34.0)
MCHC: 32.7 g/dL (ref 30.0–36.0)
MCV: 87.7 fL (ref 80.0–100.0)
Platelets: 175 10*3/uL (ref 150–400)
RBC: 4.15 MIL/uL (ref 3.87–5.11)
RDW: 15.7 % — ABNORMAL HIGH (ref 11.5–15.5)
WBC: 4.5 10*3/uL (ref 4.0–10.5)
nRBC: 0 % (ref 0.0–0.2)

## 2023-12-07 LAB — PHOSPHORUS: Phosphorus: 3.5 mg/dL (ref 2.5–4.6)

## 2023-12-07 LAB — MAGNESIUM: Magnesium: 2 mg/dL (ref 1.7–2.4)

## 2023-12-07 NOTE — Progress Notes (Signed)
 PROGRESS NOTE  Christine Christensen HKV:[id number] DOB: 05/18/1946 DOA: 12/05/2023 PCP: Fleet Contras, MD   LOS: 1 day   Brief Narrative / Interim history: 78 year old female with history of advanced dementia, prior CVA, chronic diastolic CHF, physical debility, wheelchair-bound at baseline, living in a SNF, history of ESBL UTI as well as aspiration pneumonia comes into the hospital due to being diagnosed. In the ER confusion and combative UA showed evidence of a UTI, she was started on antibiotics and admitted to the hospital   Subjective / 24h Interval events: Underlying dementia, no overnight events, comfortable  Assesement and Plan: Principal problem Acute metabolic encephalopathy, suspect secondary to presumptive UTI - Treat underlying condition, continue antibiotics -Poor baseline to begin with  Active problems Presumptive UTI with recent history of ESBL E. coli UTI, POA -has been placed on antibiotics, continue.  Urine cultures with gram-negative rods   Dementia with behavioral disturbances, history of CVAs -continue supportive care, continue home medication with Lexapro, BuSpar  Questionable aphasia, resolved - No acute intracranial abnormality from noncontrast head CT done on 12/05/2023.   Essential hypertension -soft blood pressure at times, hold home medications  Severe mitral stenosis, Chronic HFpEF - Euvolemic on exam   History of CVA - Resume home aspirin  Scheduled Meds:  aspirin  325 mg Oral Daily   busPIRone  5 mg Oral TID   enoxaparin (LOVENOX) injection  40 mg Subcutaneous Q24H   escitalopram  20 mg Oral q morning   spironolactone  25 mg Oral Daily   torsemide  20 mg Oral Daily   Continuous Infusions:  meropenem (MERREM) IV 1 g (12/07/23 1130)   PRN Meds:.acetaminophen, haloperidol lactate, melatonin, polyethylene glycol, prochlorperazine  Current Outpatient Medications  Medication Instructions   acetaminophen (TYLENOL) 1,000 mg, Oral, Every 6 hours PRN    albuterol (VENTOLIN HFA) 108 (90 Base) MCG/ACT inhaler 2 puffs, Inhalation, Every 4 hours PRN   aspirin 325 mg, Oral, Daily   busPIRone (BUSPAR) 5 mg, Oral, 3 times daily   donepezil (ARICEPT) 5 mg, Oral, 2 times daily   escitalopram (LEXAPRO) 20 mg, Oral, Every morning   LACTOBACILLUS PO 1 capsule, Oral, 2 times daily   melatonin 3 mg, Oral, Daily at bedtime   memantine (NAMENDA) 10 mg, Oral, 2 times daily   metoprolol tartrate (LOPRESSOR) 25 mg, Oral, 2 times daily   Multiple Vitamin (MULTIVITAMIN WITH MINERALS) TABS tablet 1 tablet, Oral, Daily   polyethylene glycol (MIRALAX / GLYCOLAX) 17 g, Oral, 2 times daily   senna-docusate (SENOKOT-S) 8.6-50 MG tablet 1 tablet, Oral, 2 times daily   spironolactone (ALDACTONE) 25 mg, Oral, Daily   torsemide (DEMADEX) 20 mg, Oral, Daily    Diet Orders (From admission, onward)     Start     Ordered   12/05/23 2225  Diet NPO time specified  Diet effective now        12/05/23 2224            DVT prophylaxis: enoxaparin (LOVENOX) injection 40 mg Start: 12/06/23 1400   Lab Results  Component Value Date   PLT 175 12/07/2023      Code Status: Limited: Do not attempt resuscitation (DNR) -DNR-LIMITED -Do Not Intubate/DNI   Family Communication: No family at bedside  Status is: Inpatient Remains inpatient appropriate because: IV antibiotics  Level of care: Med-Surg  Consultants:  Palliative care  Objective: Vitals:   12/06/23 1616 12/06/23 2212 12/07/23 0447 12/07/23 0830  BP: 109/77 129/69 98/78 108/68  Pulse: 84 (!)  103 74 (!) 109  Resp: 16 18 18 18   Temp: 98.2 F (36.8 C) (!) 97.5 F (36.4 C) 98 F (36.7 C) 98.3 F (36.8 C)  TempSrc:      SpO2:  99% 100% 99%  Weight:      Height:        Intake/Output Summary (Last 24 hours) at 12/07/2023 1157 Last data filed at 12/06/2023 1851 Gross per 24 hour  Intake 24.97 ml  Output --  Net 24.97 ml   Wt Readings from Last 3 Encounters:  12/05/23 58.1 kg  11/08/23 58.4 kg   10/27/23 68 kg    Examination:  Constitutional: NAD Eyes: no scleral icterus ENMT: Mucous membranes are moist.  Neck: normal, supple Respiratory: clear to auscultation bilaterally, no wheezing, no crackles.  Cardiovascular: Regular rate and rhythm, no murmurs / rubs / gallops. No LE edema.  Abdomen: non distended, no tenderness. Bowel sounds positive.  Musculoskeletal: no clubbing / cyanosis.   Data Reviewed: I have independently reviewed following labs and imaging studies   CBC Recent Labs  Lab 12/05/23 1959 12/06/23 0429 12/07/23 0531  WBC 6.0 6.4 4.5  HGB 12.3 11.7* 11.9*  HCT 38.3 35.6* 36.4  PLT 170 169 175  MCV 89.9 88.8 87.7  MCH 28.9 29.2 28.7  MCHC 32.1 32.9 32.7  RDW 15.6* 15.5 15.7*    Recent Labs  Lab 12/05/23 1959 12/06/23 0429 12/07/23 0531  NA 141 141 142  K 3.9 3.6 3.7  CL 102 103 103  CO2 32 28 26  GLUCOSE 96 94 83  BUN 15 16 14   CREATININE 1.17* 1.06* 1.01*  CALCIUM 9.3 9.2 9.2  AST 20  --  16  ALT 13  --  11  ALKPHOS 55  --  49  BILITOT 0.4  --  0.8  ALBUMIN 3.7  --  3.1*  MG  --  1.9 2.0    ------------------------------------------------------------------------------------------------------------------ No results for input(s): "CHOL", "HDL", "LDLCALC", "TRIG", "CHOLHDL", "LDLDIRECT" in the last 72 hours.  Lab Results  Component Value Date   HGBA1C 5.8 (H) 10/12/2023   ------------------------------------------------------------------------------------------------------------------ No results for input(s): "TSH", "T4TOTAL", "T3FREE", "THYROIDAB" in the last 72 hours.  Invalid input(s): "FREET3"  Cardiac Enzymes No results for input(s): "CKMB", "TROPONINI", "MYOGLOBIN" in the last 168 hours.  Invalid input(s): "CK" ------------------------------------------------------------------------------------------------------------------    Component Value Date/Time   BNP 247.0 (H) 12/25/2021 1742    CBG: Recent Labs  Lab  12/05/23 2313  GLUCAP 93    Recent Results (from the past 240 hours)  Resp panel by RT-PCR (RSV, Flu A&B, Covid) Anterior Nasal Swab     Status: None   Collection Time: 12/05/23 10:32 PM   Specimen: Anterior Nasal Swab  Result Value Ref Range Status   SARS Coronavirus 2 by RT PCR NEGATIVE NEGATIVE Final   Influenza A by PCR NEGATIVE NEGATIVE Final   Influenza B by PCR NEGATIVE NEGATIVE Final    Comment: (NOTE) The Xpert Xpress SARS-CoV-2/FLU/RSV plus assay is intended as an aid in the diagnosis of influenza from Nasopharyngeal swab specimens and should not be used as a sole basis for treatment. Nasal washings and aspirates are unacceptable for Xpert Xpress SARS-CoV-2/FLU/RSV testing.  Fact Sheet for Patients: BloggerCourse.com  Fact Sheet for Healthcare Providers: SeriousBroker.it  This test is not yet approved or cleared by the [place] FDA and has been authorized for detection and/or diagnosis of SARS-CoV-2 by FDA under an Emergency Use Authorization (EUA). This EUA will remain in effect (meaning  this test can be used) for the duration of the COVID-19 declaration under Section 564(b)(1) of the Act, 21 U.S.C. section 360bbb-3(b)(1), unless the authorization is terminated or revoked.     Resp Syncytial Virus by PCR NEGATIVE NEGATIVE Final    Comment: (NOTE) Fact Sheet for Patients: BloggerCourse.com  Fact Sheet for Healthcare Providers: SeriousBroker.it  This test is not yet approved or cleared by the [place] FDA and has been authorized for detection and/or diagnosis of SARS-CoV-2 by FDA under an Emergency Use Authorization (EUA). This EUA will remain in effect (meaning this test can be used) for the duration of the COVID-19 declaration under Section 564(b)(1) of the Act, 21 U.S.C. section 360bbb-3(b)(1), unless the authorization is terminated  or revoked.  Performed at [facility], [address] [place], [place] [address]   Urine Culture     Status: Abnormal (Preliminary result)   Collection Time: 12/06/23 12:25 AM   Specimen: Urine, Clean Catch  Result Value Ref Range Status   Specimen Description URINE, CLEAN CATCH  Final   Special Requests NONE  Final   Culture (A)  Final    >=100,000 COLONIES/mL GRAM NEGATIVE RODS SUSCEPTIBILITIES TO FOLLOW Performed at [facility], [address], [place], [place] [address]    Report Status PENDING  Incomplete     Radiology Studies: No results found.   Pamella Pert, MD, PhD [facility]  Between 7 am - 7 pm I am available, please contact me via Amion (for emergencies) or Securechat (non urgent messages)  Between 7 pm - 7 am I am not available, please contact night coverage MD/APP via Amion

## 2023-12-08 DIAGNOSIS — R404 Transient alteration of awareness: Secondary | ICD-10-CM | POA: Diagnosis not present

## 2023-12-08 LAB — URINE CULTURE: Culture: >=100000 — AB

## 2023-12-08 NOTE — Progress Notes (Signed)
 PROGRESS NOTE  Christine Christensen ZOX:[id number] DOB: Feb 28, 1946 DOA: 12/05/2023 PCP: Fleet Contras, MD   LOS: 2 days   Brief Narrative / Interim history: 78 year old female with history of advanced dementia, prior CVA, chronic diastolic CHF, physical debility, wheelchair-bound at baseline, living in a SNF, history of ESBL UTI as well as aspiration pneumonia comes into the hospital due to being diagnosed. In the ER confusion and combative UA showed evidence of a UTI, she was started on antibiotics and admitted to the hospital   Subjective / 24h Interval events: She is much more alert today, frowning upon me entering the room and upset with me asking questions  Assesement and Plan: Principal problem Acute metabolic encephalopathy, secondary to ESBL UTI - Treat underlying condition, continue antibiotics -Poor baseline to begin with -Urine cultures speciated ESBL E. coli, continue meropenem while here -Still unable to work with speech therapy due to baseline combativeness, once that happens perhaps he could receive a dose of fosfomycin and return to SNF  Active problems Presumptive UTI with recent history of ESBL E. coli UTI, POA -has been placed on antibiotics, continue.  Urine cultures with ESBL E. coli as above   Dementia with behavioral disturbances, history of CVAs -continue supportive care, continue home medication with Lexapro, BuSpar  Questionable aphasia, resolved - No acute intracranial abnormality from noncontrast head CT done on 12/05/2023.  Awaiting speech evaluation before allowing diet so that she can return to SNF   Essential hypertension -soft blood pressure at times, hold home medications  Severe mitral stenosis, Chronic HFpEF - Euvolemic on exam   History of CVA - Resume home aspirin  Scheduled Meds:  aspirin  325 mg Oral Daily   busPIRone  5 mg Oral TID   enoxaparin (LOVENOX) injection  40 mg Subcutaneous Q24H   escitalopram  20 mg Oral q morning   spironolactone   25 mg Oral Daily   torsemide  20 mg Oral Daily   Continuous Infusions:  meropenem (MERREM) IV 1 g (12/08/23 1005)   PRN Meds:.acetaminophen, haloperidol lactate, melatonin, polyethylene glycol, prochlorperazine  Current Outpatient Medications  Medication Instructions   acetaminophen (TYLENOL) 1,000 mg, Oral, Every 6 hours PRN   albuterol (VENTOLIN HFA) 108 (90 Base) MCG/ACT inhaler 2 puffs, Inhalation, Every 4 hours PRN   aspirin 325 mg, Oral, Daily   busPIRone (BUSPAR) 5 mg, Oral, 3 times daily   donepezil (ARICEPT) 5 mg, Oral, 2 times daily   escitalopram (LEXAPRO) 20 mg, Oral, Every morning   LACTOBACILLUS PO 1 capsule, Oral, 2 times daily   melatonin 3 mg, Oral, Daily at bedtime   memantine (NAMENDA) 10 mg, Oral, 2 times daily   metoprolol tartrate (LOPRESSOR) 25 mg, Oral, 2 times daily   Multiple Vitamin (MULTIVITAMIN WITH MINERALS) TABS tablet 1 tablet, Oral, Daily   polyethylene glycol (MIRALAX / GLYCOLAX) 17 g, Oral, 2 times daily   senna-docusate (SENOKOT-S) 8.6-50 MG tablet 1 tablet, Oral, 2 times daily   spironolactone (ALDACTONE) 25 mg, Oral, Daily   torsemide (DEMADEX) 20 mg, Oral, Daily    Diet Orders (From admission, onward)     Start     Ordered   12/05/23 2225  Diet NPO time specified  Diet effective now        12/05/23 2224            DVT prophylaxis: enoxaparin (LOVENOX) injection 40 mg Start: 12/06/23 1400   Lab Results  Component Value Date   PLT 175 12/07/2023  Code Status: Limited: Do not attempt resuscitation (DNR) -DNR-LIMITED -Do Not Intubate/DNI   Family Communication: No family at bedside  Status is: Inpatient Remains inpatient appropriate because: IV antibiotics  Level of care: Med-Surg  Consultants:  Palliative care  Objective: Vitals:   12/07/23 1526 12/07/23 2045 12/08/23 0255 12/08/23 0918  BP: (!) 101/56 123/60 99/66 (!) 110/92  Pulse: 76 75 85 83  Resp: 18 18    Temp: 97.8 F (36.6 C) (!) 97.5 F (36.4 C) (!)  97.4 F (36.3 C)   TempSrc: Axillary Axillary Axillary   SpO2: 98% 96% 100%   Weight:   57.4 kg   Height:       No intake or output data in the 24 hours ending 12/08/23 1156  Wt Readings from Last 3 Encounters:  12/08/23 57.4 kg  11/08/23 58.4 kg  10/27/23 68 kg    Examination:  Constitutional: NAD Eyes: lids and conjunctivae normal, no scleral icterus ENMT: mmm Neck: normal, supple Respiratory: clear to auscultation bilaterally, no wheezing, no crackles. Normal respiratory effort.  Cardiovascular: Regular rate and rhythm, no murmurs / rubs / gallops. No LE edema. Abdomen: soft, no distention, no tenderness. Bowel sounds positive.   Data Reviewed: I have independently reviewed following labs and imaging studies   CBC Recent Labs  Lab 12/05/23 1959 12/06/23 0429 12/07/23 0531  WBC 6.0 6.4 4.5  HGB 12.3 11.7* 11.9*  HCT 38.3 35.6* 36.4  PLT 170 169 175  MCV 89.9 88.8 87.7  MCH 28.9 29.2 28.7  MCHC 32.1 32.9 32.7  RDW 15.6* 15.5 15.7*    Recent Labs  Lab 12/05/23 1959 12/06/23 0429 12/07/23 0531  NA 141 141 142  K 3.9 3.6 3.7  CL 102 103 103  CO2 32 28 26  GLUCOSE 96 94 83  BUN 15 16 14   CREATININE 1.17* 1.06* 1.01*  CALCIUM 9.3 9.2 9.2  AST 20  --  16  ALT 13  --  11  ALKPHOS 55  --  49  BILITOT 0.4  --  0.8  ALBUMIN 3.7  --  3.1*  MG  --  1.9 2.0    ------------------------------------------------------------------------------------------------------------------ No results for input(s): "CHOL", "HDL", "LDLCALC", "TRIG", "CHOLHDL", "LDLDIRECT" in the last 72 hours.  Lab Results  Component Value Date   HGBA1C 5.8 (H) 10/12/2023   ------------------------------------------------------------------------------------------------------------------ No results for input(s): "TSH", "T4TOTAL", "T3FREE", "THYROIDAB" in the last 72 hours.  Invalid input(s): "FREET3"  Cardiac Enzymes No results for input(s): "CKMB", "TROPONINI", "MYOGLOBIN" in the last  168 hours.  Invalid input(s): "CK" ------------------------------------------------------------------------------------------------------------------    Component Value Date/Time   BNP 247.0 (H) 12/25/2021 1742    CBG: Recent Labs  Lab 12/05/23 2313  GLUCAP 93    Recent Results (from the past 240 hours)  Resp panel by RT-PCR (RSV, Flu A&B, Covid) Anterior Nasal Swab     Status: None   Collection Time: 12/05/23 10:32 PM   Specimen: Anterior Nasal Swab  Result Value Ref Range Status   SARS Coronavirus 2 by RT PCR NEGATIVE NEGATIVE Final   Influenza A by PCR NEGATIVE NEGATIVE Final   Influenza B by PCR NEGATIVE NEGATIVE Final    Comment: (NOTE) The Xpert Xpress SARS-CoV-2/FLU/RSV plus assay is intended as an aid in the diagnosis of influenza from Nasopharyngeal swab specimens and should not be used as a sole basis for treatment. Nasal washings and aspirates are unacceptable for Xpert Xpress SARS-CoV-2/FLU/RSV testing.  Fact Sheet for Patients: BloggerCourse.com  Fact Sheet for Healthcare Providers:  SeriousBroker.it  This test is not yet approved or cleared by the [place] and has been authorized for detection and/or diagnosis of SARS-CoV-2 by FDA under an Emergency Use Authorization (EUA). This EUA will remain in effect (meaning this test can be used) for the duration of the COVID-19 declaration under Section 564(b)(1) of the Act, 21 U.S.C. section 360bbb-3(b)(1), unless the authorization is terminated or revoked.     Resp Syncytial Virus by PCR NEGATIVE NEGATIVE Final    Comment: (NOTE) Fact Sheet for Patients: BloggerCourse.com  Fact Sheet for Healthcare Providers: SeriousBroker.it  This test is not yet approved or cleared by the [place] FDA and has been authorized for detection and/or diagnosis of SARS-CoV-2 by FDA under an Emergency Use  Authorization (EUA). This EUA will remain in effect (meaning this test can be used) for the duration of the COVID-19 declaration under Section 564(b)(1) of the Act, 21 U.S.C. section 360bbb-3(b)(1), unless the authorization is terminated or revoked.  Performed at [facility], [address] [place], [place] [address]   Urine Culture     Status: Abnormal   Collection Time: 12/06/23 12:25 AM   Specimen: Urine, Clean Catch  Result Value Ref Range Status   Specimen Description URINE, CLEAN CATCH  Final   Special Requests   Final    NONE Performed at [facility], [address], [place], [place] [address]    Culture (A)  Final    >=100,000 COLONIES/mL ESCHERICHIA COLI Confirmed Extended Spectrum Beta-Lactamase Producer (ESBL).  In bloodstream infections from ESBL organisms, carbapenems are preferred over piperacillin/tazobactam. They are shown to have a lower risk of mortality.    Report Status 12/08/2023 FINAL  Final   Organism ID, Bacteria ESCHERICHIA COLI (A)  Final      Susceptibility   Escherichia coli - MIC*    AMPICILLIN >=32 RESISTANT Resistant     CEFAZOLIN >=64 RESISTANT Resistant     CEFEPIME 8 INTERMEDIATE Intermediate     CEFTRIAXONE >=64 RESISTANT Resistant     CIPROFLOXACIN >=4 RESISTANT Resistant     GENTAMICIN <=1 SENSITIVE Sensitive     IMIPENEM <=0.25 SENSITIVE Sensitive     NITROFURANTOIN <=16 SENSITIVE Sensitive     TRIMETH/SULFA >=320 RESISTANT Resistant     AMPICILLIN/SULBACTAM >=32 RESISTANT Resistant     PIP/TAZO 64 INTERMEDIATE Intermediate ug/mL    * >=100,000 COLONIES/mL ESCHERICHIA COLI     Radiology Studies: No results found.   Pamella Pert, MD, PhD [facility]  Between 7 am - 7 pm I am available, please contact me via Amion (for emergencies) or Securechat (non urgent messages)  Between 7 pm - 7 am I am not available, please contact night coverage MD/APP via Amion
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
## 2023-12-08 NOTE — Evaluation (Addendum)
 Clinical/Bedside Swallow Evaluation Patient Details  Name: Christine Christensen MRN: [id number] Date of Birth: 1946-04-02  Today's Date: 12/08/2023 Time: SLP Start Time (ACUTE ONLY): 1416 SLP Stop Time (ACUTE ONLY): 1438 SLP Time Calculation (min) (ACUTE ONLY): 22 min  Past Medical History:  Past Medical History:  Diagnosis Date   Anemia    history of anemia   Aortic stenosis    mild aortic stenosis 07/27/17 echo   Arthritis    Heart murmur    Hypertension    Mitral stenosis    moderate mitral stenosis 07/27/17 echo   Stroke [facility])    stroke x2, 2005 and 2008, some right sided weakness   Syncope and collapse 10/25/2017   Urinary incontinence    Past Surgical History:  Past Surgical History:  Procedure Laterality Date   CATARACT EXTRACTION, BILATERAL Bilateral    CHOLECYSTECTOMY     HIP ARTHROPLASTY Right 06/14/2021   Procedure: ARTHROPLASTY BIPOLAR HIP (HEMIARTHROPLASTY);  Surgeon: Joen Laura, MD;  Location: [facility] OR;  Service: Orthopedics;  Laterality: Right;   TONSILLECTOMY     TOTAL KNEE ARTHROPLASTY Right    right knee 2006   TOTAL KNEE ARTHROPLASTY Left 08/06/2017   Procedure: LEFT TOTAL KNEE ARTHROPLASTY;  Surgeon: Dannielle Huh, MD;  Location: [facility] OR;  Service: Orthopedics;  Laterality: Left;   TUBAL LIGATION     HPI:  78 year old female with history of advanced dementia, prior CVA, chronic diastolic CHF, physical debility, wheelchair-bound at baseline, living in a SNF, history of ESBL UTI as well as aspiration pneumonia comes into the hospital due to being diagnosed. In the ER confusion and combative UA showed evidence of a UTI, she was started on antibiotics and admitted to the hospital    Assessment / Plan / Recommendation  Clinical Impression   Pt seen for skilled ST services to determine PO readiness. The pt is currently NPO and was assessed with thin liquid, puree, and regular solids. The pt upon arrival was partially reclined in the bed and was pleasantly confused.  Full OME not appropriate given the pt's mental status and recent physical aggression. The pt consumed all consistencies with no overt s/sx of aspiration but did display impulsivity with large sequential sips. The pt was pleasantly confused and agreeable to the SLP administering food. The pt requested pepsi and consumed the carbonated liquid with no overt s/sx aspiration. Given the pt's AMS and concern for pocketing, safest diet rec at this time is dys 3/thin liquid with staff in room providing cues for all opportunities with PO intake to reduce likelihood of aspiration event. General swallow precautions (stay upright, small bites and sips, eat slowly, cued liquid wash PRN, NO PO intake when lethargic or agitated) and frequent oral care as pt allows. SLP to f/u to determine success with recommendations.   SLP Visit Diagnosis: Dysphagia, unspecified (R13.10)    Aspiration Risk  Mild aspiration risk    Diet Recommendation Dysphagia 3 (Mech soft);Thin liquid    Medication Administration: Crushed with puree Supervision: Staff to assist with self feeding;Full supervision/cueing for compensatory strategies Compensations: Minimize environmental distractions;Slow rate;Small sips/bites;Follow solids with liquid Postural Changes: Seated upright at 90 degrees;Remain upright for at least 30 minutes after po intake    Other  Recommendations Oral Care Recommendations: Oral care BID    Recommendations for follow up therapy are one component of a multi-disciplinary discharge planning process, led by the attending physician.  Recommendations may be updated based on patient status, additional functional criteria and insurance authorization.  Assistance Recommended at Discharge    Functional Status Assessment Patient has had a recent decline in their functional status and demonstrates the ability to make significant improvements in function in a reasonable and predictable amount of time.  Frequency and Duration  2x a week          Prognosis Prognosis for improved oropharyngeal function: Fair Barriers to Reach Goals: Cognitive deficits;Severity of deficits;Behavior      Swallow Study   General Date of Onset: 12/05/23 HPI: 78 year old female with history of advanced dementia, prior CVA, chronic diastolic CHF, physical debility, wheelchair-bound at baseline, living in a SNF, history of ESBL UTI as well as aspiration pneumonia comes into the hospital due to being diagnosed. In the ER confusion and combative UA showed evidence of a UTI, she was started on antibiotics and admitted to the hospital Type of Study: Bedside Swallow Evaluation Diet Prior to this Study: NPO Temperature Spikes Noted: No Respiratory Status: Room air Behavior/Cognition: Agitated;Confused;Impulsive;Requires cueing;Doesn't follow directions;Distractible Oral Cavity Assessment: Other (comment) (Unable to assess) Oral Care Completed by SLP: Other (Comment) (Not attempted due to pt's hx of physical aggression) Oral Cavity - Dentition: Other (Comment) (Not observed) Vision: Functional for self-feeding Self-Feeding Abilities: Needs assist;Able to feed self (Able to drink via straw) Patient Positioning: Upright in bed Baseline Vocal Quality: Normal Volitional Cough: Cognitively unable to elicit Volitional Swallow: Unable to elicit    Oral/Motor/Sensory Function Overall Oral Motor/Sensory Function: Other (comment) (No formal OME, no deficits observed by SLP t/o PO intake)   Ice Chips Ice chips: Not tested   Thin Liquid Thin Liquid: Within functional limits Presentation: Straw    Nectar Thick     Honey Thick     Puree Puree: Within functional limits Presentation: Spoon (assisted feed)   Solid     Solid: Within functional limits Presentation: Self Fed      Dione Housekeeper M.S. CCC-SLP

## 2023-12-08 NOTE — TOC Initial Note (Signed)
 Transition of Care [facility]) - Initial/Assessment Note    Patient Details  Name: Christine Christensen MRN: [id number] Date of Birth: [birth year]  Transition of Care [facility]) CM/SW Contact:    Deatra Robinson, [place] Phone Number: 12/08/2023, 10:48 AM  Clinical Narrative:   Pt admitted from [facility] where she is a LTC/SNF resident. DSS is pt's legal guardian. Confirmed with Janie at [facility], pt is able to return at dc. SW will follow.   Dellie Burns, MSW, LCSW [phone] (coverage)                  Expected Discharge Plan: Skilled Nursing Facility Barriers to Discharge: Continued Medical Work up   Patient Goals and CMS Choice            Expected Discharge Plan and Services       Living arrangements for the past 2 months: Skilled Nursing Facility                                      Prior Living Arrangements/Services Living arrangements for the past 2 months: Skilled Nursing Facility Lives with:: Facility Resident Patient language and need for interpreter reviewed:: Yes        Need for Family Participation in Patient Care: No (Comment) Care giver support system in place?: Yes (comment)   Criminal Activity/Legal Involvement Pertinent to Current Situation/Hospitalization: No - Comment as needed  Activities of Daily Living      Permission Sought/Granted Permission sought to share information with : Facility Industrial/product designer granted to share information with : Yes, Verbal Permission Granted              Emotional Assessment       Orientation: :  (disoriented x4) Alcohol / Substance Use: Not Applicable Psych Involvement: No (comment)  Admission diagnosis:  AMS (altered mental status) [R41.82] Acute metabolic encephalopathy [G93.41] Patient Active Problem List   Diagnosis Date Noted   AMS (altered mental status) 12/06/2023   Acute metabolic encephalopathy 12/06/2023   Pneumonia 11/01/2023   Aphasia 10/12/2023   UTI  (urinary tract infection) 10/12/2023   Sepsis due to COVID-19 (HCC) 11/01/2021   Lactic acidosis 11/01/2021   Acute on chronic diastolic CHF (congestive heart failure) (HCC) 11/01/2021   Essential hypertension 11/01/2021   Vascular dementia (HCC) 11/01/2021   Delirium due to another medical condition 06/22/2021   Dementia with behavioral disturbance    Pressure injury of skin 06/17/2021   Protein-calorie malnutrition, severe 06/13/2021   Closed right hip fracture, initial encounter (HCC) 06/12/2021   Hip fracture (HCC) 06/12/2021   Fall at home, initial encounter    Syncope and collapse 10/25/2017   S/P total knee replacement 08/06/2017   PCP:  Fleet Contras, MD Pharmacy:   [facility] [place], [place] - [address] [place] [address] Phone: [phone] Fax: [phone]     Social Drivers of Health (SDOH) Social History: SDOH Screenings   Food Insecurity: No Food Insecurity (11/01/2023)  Housing: Low Risk  (11/01/2023)  Transportation Needs: No Transportation Needs (11/01/2023)  Utilities: Not At Risk (11/01/2023)  Social Connections: Patient Unable To Answer (11/01/2023)  Tobacco Use: Medium Risk (12/05/2023)   SDOH Interventions:     Readmission Risk Interventions     No data to display

## 2023-12-08 NOTE — Plan of Care (Signed)
  Problem: Safety: Goal: Non-violent Restraint(s) Outcome: Completed/Met   Problem: Education: Goal: Knowledge of General Education information will improve Description: Including pain rating scale, medication(s)/side effects and non-pharmacologic comfort measures Outcome: Progressing   Problem: Health Behavior/Discharge Planning: Goal: Ability to manage health-related needs will improve Outcome: Progressing   Problem: Clinical Measurements: Goal: Ability to maintain clinical measurements within normal limits will improve Outcome: Progressing Goal: Will remain free from infection Outcome: Progressing Goal: Diagnostic test results will improve Outcome: Progressing Goal: Respiratory complications will improve Outcome: Progressing Goal: Cardiovascular complication will be avoided Outcome: Progressing   Problem: Activity: Goal: Risk for activity intolerance will decrease Outcome: Progressing   Problem: Nutrition: Goal: Adequate nutrition will be maintained Outcome: Progressing   Problem: Coping: Goal: Level of anxiety will decrease Outcome: Progressing   Problem: Elimination: Goal: Will not experience complications related to bowel motility Outcome: Progressing Goal: Will not experience complications related to urinary retention Outcome: Progressing   Problem: Pain Managment: Goal: General experience of comfort will improve and/or be controlled Outcome: Progressing   Problem: Safety: Goal: Ability to remain free from injury will improve Outcome: Progressing   Problem: Skin Integrity: Goal: Risk for impaired skin integrity will decrease Outcome: Progressing

## 2023-12-09 DIAGNOSIS — R404 Transient alteration of awareness: Secondary | ICD-10-CM | POA: Diagnosis not present

## 2023-12-09 MED ORDER — FOSFOMYCIN TROMETHAMINE 3 G PO PACK
3.0000 g | PACK | Freq: Once | ORAL | Status: AC
  Administered 2023-12-09: 3 g via ORAL
  Filled 2023-12-09: qty 3

## 2023-12-09 NOTE — TOC Transition Note (Signed)
 Transition of Care [facility]) - Discharge Note   Patient Details  Name: Christine Christensen MRN: [id number] Date of Birth: [birth year]  Transition of Care [facility]) CM/SW Contact:  Deatra [place], [place] Phone Number: 12/09/2023, 10:42 AM   Clinical Narrative: Pt for dc back to [facility] today where she is a LTC resident. Voicemail left for pt's DSS Guardian [name] [phone] notifying her of dc. RN provided with number for report and PTAR arranged for transport. SW signing off at dc.   Dellie Burns, MSW, LCSW [phone] (coverage)      Final next level of care: Skilled Nursing Facility Barriers to Discharge: Barriers Resolved   Patient Goals and CMS Choice            Discharge Placement              Patient chooses bed at: Other - please specify in the comment section below: [facility]) Patient to be transferred to facility by: PTAR Name of family member notified: [name]/DSS Guardian Patient and family notified of of transfer: 12/09/23  Discharge Plan and Services Additional resources added to the After Visit Summary for                                       Social Drivers of Health (SDOH) Interventions SDOH Screenings   Food Insecurity: No Food Insecurity (11/01/2023)  Housing: Low Risk  (11/01/2023)  Transportation Needs: No Transportation Needs (11/01/2023)  Utilities: Not At Risk (11/01/2023)  Social Connections: Patient Unable To Answer (11/01/2023)  Tobacco Use: Medium Risk (12/05/2023)     Readmission Risk Interventions     No data to display

## 2023-12-09 NOTE — Progress Notes (Signed)
 Report called to [facility], [phone] and spoke with  Creneshia to give report.  PTAR here to take pt back to the facility.

## 2023-12-09 NOTE — Discharge Summary (Signed)
 Physician Discharge Summary  Christine Christensen VHQ:[id number] DOB: November 14, 1945 DOA: 12/05/2023  PCP: Fleet Contras, MD  Admit date: 12/05/2023 Discharge date: 12/09/2023  Admitted From: Home Disposition:  Home  Discharge Condition:Stable CODE STATUS: DNR Diet recommendation: Dysphagia 3  Brief/Interim Summary: Patient is a 78 year old female with history of advanced dementia, prior CVA, chronic diastolic CHF, debility, wheelchair-bound, living at nursing facility presented here with complaint of altered mental status, combativeness. On presentation, she was hemodynamically stable. UTI suspected. Urine culture showed ESBL E. coli, started on meropenem.  Her overall clinical status has improved and currently she is at her baseline mentation plan .  She was treated with 3 days of meropenem already, given a dose of fosfomycin today for ESBL E. coli.  No signs of sepsis.  Medical stable for discharge back to nursing facility today  Following problems were addressed during the hospitalization:  Acute metabolic encephalopathy secondary to ESBL UTI: History of ESBL UTI in the past.  Urine culture showed ESBL E. coli.  Currently on meropenem, day 4.  Given  dose of fosfomycin.  Mentation at baseline.   Dementia with behavioral disturbances/history of CVA: Continue supportive care, on Lexapro, BuSpar and other home medications   Hypertension: Blood pressure stable.  Continue to hold metoprolol for now.   History of severe mitral valve stenosis/chronic HFpEF: Currently euvolemic.  Takes torsemide, spironolactone.  Continue   History of CVA: Wheelchair-bound, on aspirin.   Discharge Diagnoses:  Principal Problem:   AMS (altered mental status) Active Problems:   Acute metabolic encephalopathy    Discharge Instructions  Discharge Instructions     Diet general   Complete by: As directed    Dysphagia 3 diet   Discharge instructions   Complete by: As directed    1)Please take your medications  as instructed 2)Follow up with your PCP in a week   Increase activity slowly   Complete by: As directed       Allergies as of 12/09/2023       Reactions   Penicillins Other (See Comments)   Unknown reaction        Medication List     STOP taking these medications    metoprolol tartrate 25 MG tablet Commonly known as: LOPRESSOR       TAKE these medications    acetaminophen 500 MG tablet Commonly known as: TYLENOL Take 2 tablets (1,000 mg total) by mouth every 6 (six) hours as needed for mild pain, moderate pain, fever or headache.   albuterol 108 (90 Base) MCG/ACT inhaler Commonly known as: VENTOLIN HFA Inhale 2 puffs into the lungs every 4 (four) hours as needed for wheezing or shortness of breath.   aspirin 325 MG tablet Take 1 tablet (325 mg total) by mouth daily.   busPIRone 5 MG tablet Commonly known as: BUSPAR Take 5 mg by mouth 3 (three) times daily.   donepezil 5 MG tablet Commonly known as: ARICEPT Take 5 mg by mouth in the morning and at bedtime.   escitalopram 20 MG tablet Commonly known as: LEXAPRO Take 20 mg by mouth every morning.   LACTOBACILLUS PO Take 1 capsule by mouth in the morning and at bedtime.   melatonin 3 MG Tabs tablet Take 1 tablet (3 mg total) by mouth at bedtime.   memantine 10 MG tablet Commonly known as: NAMENDA Take 10 mg by mouth 2 (two) times daily.   multivitamin with minerals Tabs tablet Take 1 tablet by mouth daily.   polyethylene glycol 17  g packet Commonly known as: MIRALAX / GLYCOLAX Take 17 g by mouth 2 (two) times daily.   senna-docusate 8.6-50 MG tablet Commonly known as: Senokot-S Take 1 tablet by mouth 2 (two) times daily.   spironolactone 25 MG tablet Commonly known as: ALDACTONE Take 1 tablet (25 mg total) by mouth daily.   torsemide 20 MG tablet Commonly known as: DEMADEX Take 1 tablet (20 mg total) by mouth daily.        Contact information for follow-up providers     Fleet Contras,  MD. Schedule an appointment as soon as possible for a visit in 1 week(s).   Specialty: Internal Medicine Contact information: [address] [place] [address] [phone]              Contact information for after-discharge care     Destination     [facility] .   Service: Skilled Nursing Contact information: [address] [place] [address] [phone]                    Allergies  Allergen Reactions   Penicillins Other (See Comments)    Unknown reaction    Consultations: None   Procedures/Studies: CT Head Wo Contrast Result Date: 12/05/2023 CLINICAL DATA:  Mental status change, unknown cause EXAM: CT HEAD WITHOUT CONTRAST TECHNIQUE: Contiguous axial images were obtained from the base of the skull through the vertex without intravenous contrast. RADIATION DOSE REDUCTION: This exam was performed according to the departmental dose-optimization program which includes automated exposure control, adjustment of the mA and/or kV according to patient size and/or use of iterative reconstruction technique. COMPARISON:  MRI and CT head 10/12/2023 FINDINGS: Brain: No intracranial hemorrhage, mass effect, or evidence of acute infarct. No hydrocephalus. No extra-axial fluid collection. Generalized cerebral atrophy and chronic small vessel ischemic disease. Chronic cerebellar and basal ganglia lacunar infarcts. Vascular: No hyperdense vessel. Intracranial arterial calcification. Skull: No fracture or focal lesion. Sinuses/Orbits: No acute finding. Other: None. IMPRESSION: No acute intracranial abnormality. Electronically Signed   By: Minerva Fester M.D.   On: 12/05/2023 23:40   DG Chest Portable 1 View Result Date: 12/05/2023 CLINICAL DATA:  Altered mental status. EXAM: PORTABLE CHEST 1 VIEW COMPARISON:  November 05, 2023 FINDINGS: The heart size and mediastinal contours are within normal limits. There is moderate severity calcification of  the aortic arch. There is no evidence of an acute infiltrate, pleural effusion or pneumothorax. Radiopaque surgical clips are seen overlying the right upper quadrant. Multilevel degenerative changes are noted throughout the thoracic spine. IMPRESSION: No active cardiopulmonary disease. Electronically Signed   By: Aram Candela M.D.   On: 12/05/2023 22:51      Subjective: Patient seen and examined at bedside today.  Hemodynamically stable.  Overall comfortable.  Lying in bed.  Not in any kind of distress.  Speech therapy evaluated her and cleared for dysphagia 3 diet.  She was not agitated.  Alert and awake, denies any complaints.  Discharge Exam: Vitals:   12/09/23 0409 12/09/23 0902  BP: 96/70 109/85  Pulse: 75 75  Resp: 18   Temp: 97.9 F (36.6 C)   SpO2: 100%    Vitals:   12/08/23 1538 12/08/23 1942 12/09/23 0409 12/09/23 0902  BP: 123/67 (!) 142/65 96/70 109/85  Pulse: 73 84 75 75  Resp:  18 18   Temp: 97.6 F (36.4 C) 98 F (36.7 C) 97.9 F (36.6 C)   TempSrc: Oral Oral Oral   SpO2: 100% 100%  100%   Weight:   58 kg   Height:        General: Pt is alert, awake, not in acute distress, oriented to place Cardiovascular: RRR, S1/S2 +, no rubs, no gallops Respiratory: CTA bilaterally, no wheezing, no rhonchi Abdominal: Soft, NT, ND, bowel sounds + Extremities: no edema, no cyanosis    The results of significant diagnostics from this hospitalization (including imaging, microbiology, ancillary and laboratory) are listed below for reference.     Microbiology: Recent Results (from the past 240 hours)  Resp panel by RT-PCR (RSV, Flu A&B, Covid) Anterior Nasal Swab     Status: None   Collection Time: 12/05/23 10:32 PM   Specimen: Anterior Nasal Swab  Result Value Ref Range Status   SARS Coronavirus 2 by RT PCR NEGATIVE NEGATIVE Final   Influenza A by PCR NEGATIVE NEGATIVE Final   Influenza B by PCR NEGATIVE NEGATIVE Final    Comment: (NOTE) The Xpert Xpress  SARS-CoV-2/FLU/RSV plus assay is intended as an aid in the diagnosis of influenza from Nasopharyngeal swab specimens and should not be used as a sole basis for treatment. Nasal washings and aspirates are unacceptable for Xpert Xpress SARS-CoV-2/FLU/RSV testing.  Fact Sheet for Patients: BloggerCourse.com  Fact Sheet for Healthcare Providers: SeriousBroker.it  This test is not yet approved or cleared by the [place] FDA and has been authorized for detection and/or diagnosis of SARS-CoV-2 by FDA under an Emergency Use Authorization (EUA). This EUA will remain in effect (meaning this test can be used) for the duration of the COVID-19 declaration under Section 564(b)(1) of the Act, 21 U.S.C. section 360bbb-3(b)(1), unless the authorization is terminated or revoked.     Resp Syncytial Virus by PCR NEGATIVE NEGATIVE Final    Comment: (NOTE) Fact Sheet for Patients: BloggerCourse.com  Fact Sheet for Healthcare Providers: SeriousBroker.it  This test is not yet approved or cleared by the [place] FDA and has been authorized for detection and/or diagnosis of SARS-CoV-2 by FDA under an Emergency Use Authorization (EUA). This EUA will remain in effect (meaning this test can be used) for the duration of the COVID-19 declaration under Section 564(b)(1) of the Act, 21 U.S.C. section 360bbb-3(b)(1), unless the authorization is terminated or revoked.  Performed at [facility], [address] [place], [place] [address]   Urine Culture     Status: Abnormal   Collection Time: 12/06/23 12:25 AM   Specimen: Urine, Clean Catch  Result Value Ref Range Status   Specimen Description URINE, CLEAN CATCH  Final   Special Requests   Final    NONE Performed at [facility], [address], [place], [place] [address]    Culture (A)  Final    >=100,000 COLONIES/mL ESCHERICHIA  COLI Confirmed Extended Spectrum Beta-Lactamase Producer (ESBL).  In bloodstream infections from ESBL organisms, carbapenems are preferred over piperacillin/tazobactam. They are shown to have a lower risk of mortality.    Report Status 12/08/2023 FINAL  Final   Organism ID, Bacteria ESCHERICHIA COLI (A)  Final      Susceptibility   Escherichia coli - MIC*    AMPICILLIN >=32 RESISTANT Resistant     CEFAZOLIN >=64 RESISTANT Resistant     CEFEPIME 8 INTERMEDIATE Intermediate     CEFTRIAXONE >=64 RESISTANT Resistant     CIPROFLOXACIN >=4 RESISTANT Resistant     GENTAMICIN <=1 SENSITIVE Sensitive     IMIPENEM <=0.25 SENSITIVE Sensitive     NITROFURANTOIN <=16 SENSITIVE Sensitive  TRIMETH/SULFA >=320 RESISTANT Resistant     AMPICILLIN/SULBACTAM >=32 RESISTANT Resistant     PIP/TAZO 64 INTERMEDIATE Intermediate ug/mL    * >=100,000 COLONIES/mL ESCHERICHIA COLI     Labs: BNP (last 3 results) No results for input(s): "BNP" in the last 8760 hours. Basic Metabolic Panel: Recent Labs  Lab 12/05/23 1959 12/06/23 0429 12/07/23 0531  NA 141 141 142  K 3.9 3.6 3.7  CL 102 103 103  CO2 32 28 26  GLUCOSE 96 94 83  BUN 15 16 14   CREATININE 1.17* 1.06* 1.01*  CALCIUM 9.3 9.2 9.2  MG  --  1.9 2.0  PHOS  --  3.0 3.5   Liver Function Tests: Recent Labs  Lab 12/05/23 1959 12/07/23 0531  AST 20 16  ALT 13 11  ALKPHOS 55 49  BILITOT 0.4 0.8  PROT 7.3 6.3*  ALBUMIN 3.7 3.1*   No results for input(s): "LIPASE", "AMYLASE" in the last 168 hours. No results for input(s): "AMMONIA" in the last 168 hours. CBC: Recent Labs  Lab 12/05/23 1959 12/06/23 0429 12/07/23 0531  WBC 6.0 6.4 4.5  HGB 12.3 11.7* 11.9*  HCT 38.3 35.6* 36.4  MCV 89.9 88.8 87.7  PLT 170 169 175   Cardiac Enzymes: No results for input(s): "CKTOTAL", "CKMB", "CKMBINDEX", "TROPONINI" in the last 168 hours. BNP: Invalid input(s): "POCBNP" CBG: Recent Labs  Lab 12/05/23 2313  GLUCAP 93   D-Dimer No  results for input(s): "DDIMER" in the last 72 hours. Hgb A1c No results for input(s): "HGBA1C" in the last 72 hours. Lipid Profile No results for input(s): "CHOL", "HDL", "LDLCALC", "TRIG", "CHOLHDL", "LDLDIRECT" in the last 72 hours. Thyroid function studies No results for input(s): "TSH", "T4TOTAL", "T3FREE", "THYROIDAB" in the last 72 hours.  Invalid input(s): "FREET3" Anemia work up No results for input(s): "VITAMINB12", "FOLATE", "FERRITIN", "TIBC", "IRON", "RETICCTPCT" in the last 72 hours. Urinalysis    Component Value Date/Time   COLORURINE YELLOW 12/05/2023 2358   APPEARANCEUR CLOUDY (A) 12/05/2023 2358   LABSPEC 1.015 12/05/2023 2358   PHURINE 5.0 12/05/2023 2358   GLUCOSEU NEGATIVE 12/05/2023 2358   HGBUR NEGATIVE 12/05/2023 2358   BILIRUBINUR NEGATIVE 12/05/2023 2358   KETONESUR NEGATIVE 12/05/2023 2358   PROTEINUR NEGATIVE 12/05/2023 2358   NITRITE POSITIVE (A) 12/05/2023 2358   LEUKOCYTESUR LARGE (A) 12/05/2023 2358   Sepsis Labs Recent Labs  Lab 12/05/23 1959 12/06/23 0429 12/07/23 0531  WBC 6.0 6.4 4.5   Microbiology Recent Results (from the past 240 hours)  Resp panel by RT-PCR (RSV, Flu A&B, Covid) Anterior Nasal Swab     Status: None   Collection Time: 12/05/23 10:32 PM   Specimen: Anterior Nasal Swab  Result Value Ref Range Status   SARS Coronavirus 2 by RT PCR NEGATIVE NEGATIVE Final   Influenza A by PCR NEGATIVE NEGATIVE Final   Influenza B by PCR NEGATIVE NEGATIVE Final    Comment: (NOTE) The Xpert Xpress SARS-CoV-2/FLU/RSV plus assay is intended as an aid in the diagnosis of influenza from Nasopharyngeal swab specimens and should not be used as a sole basis for treatment. Nasal washings and aspirates are unacceptable for Xpert Xpress SARS-CoV-2/FLU/RSV testing.  Fact Sheet for Patients: BloggerCourse.com  Fact Sheet for Healthcare Providers: SeriousBroker.it  This test is not yet approved  or cleared by the [place] FDA and has been authorized for detection and/or diagnosis of SARS-CoV-2 by FDA under an Emergency Use Authorization (EUA). This EUA will remain in effect (meaning this test can be used) for  the duration of the COVID-19 declaration under Section 564(b)(1) of the Act, 21 U.S.C. section 360bbb-3(b)(1), unless the authorization is terminated or revoked.     Resp Syncytial Virus by PCR NEGATIVE NEGATIVE Final    Comment: (NOTE) Fact Sheet for Patients: BloggerCourse.com  Fact Sheet for Healthcare Providers: SeriousBroker.it  This test is not yet approved or cleared by the [place] FDA and has been authorized for detection and/or diagnosis of SARS-CoV-2 by FDA under an Emergency Use Authorization (EUA). This EUA will remain in effect (meaning this test can be used) for the duration of the COVID-19 declaration under Section 564(b)(1) of the Act, 21 U.S.C. section 360bbb-3(b)(1), unless the authorization is terminated or revoked.  Performed at [facility], [address] [place], [place] [address]   Urine Culture     Status: Abnormal   Collection Time: 12/06/23 12:25 AM   Specimen: Urine, Clean Catch  Result Value Ref Range Status   Specimen Description URINE, CLEAN CATCH  Final   Special Requests   Final    NONE Performed at [facility], [address] [place], [place] [address]    Culture (A)  Final    >=100,000 COLONIES/mL ESCHERICHIA COLI Confirmed Extended Spectrum Beta-Lactamase Producer (ESBL).  In bloodstream infections from ESBL organisms, carbapenems are preferred over piperacillin/tazobactam. They are shown to have a lower risk of mortality.    Report Status 12/08/2023 FINAL  Final   Organism ID, Bacteria ESCHERICHIA COLI (A)  Final      Susceptibility   Escherichia coli - MIC*    AMPICILLIN >=32 RESISTANT Resistant     CEFAZOLIN >=64 RESISTANT Resistant      CEFEPIME 8 INTERMEDIATE Intermediate     CEFTRIAXONE >=64 RESISTANT Resistant     CIPROFLOXACIN >=4 RESISTANT Resistant     GENTAMICIN <=1 SENSITIVE Sensitive     IMIPENEM <=0.25 SENSITIVE Sensitive     NITROFURANTOIN <=16 SENSITIVE Sensitive     TRIMETH/SULFA >=320 RESISTANT Resistant     AMPICILLIN/SULBACTAM >=32 RESISTANT Resistant     PIP/TAZO 64 INTERMEDIATE Intermediate ug/mL    * >=100,000 COLONIES/mL ESCHERICHIA COLI    Please note: You were cared for by a hospitalist during your hospital stay. Once you are discharged, your primary care physician will handle any further medical issues. Please note that NO REFILLS for any discharge medications will be authorized once you are discharged, as it is imperative that you return to your primary care physician (or establish a relationship with a primary care physician if you do not have one) for your post hospital discharge needs so that they can reassess your need for medications and monitor your lab values.    Time coordinating discharge: 40 minutes  SIGNED:   Burnadette Pop, MD  [facility] 12/09/2023, 10:28 AM Pager [phone]  If 7PM-7AM, please contact night-coverage [email or web address] Password [email or web address]

## 2023-12-11 ENCOUNTER — Telehealth: Payer: Self-pay | Admitting: Neurology

## 2023-12-11 NOTE — Telephone Encounter (Signed)
 Lvm 1st attempt by hf 12/11/23

## 2023-12-11 NOTE — Telephone Encounter (Signed)
 Returned call to Child psychotherapist who stated that she needed to know the pt instructions from the last visit so I provided them and she voiced gratitude and understanding:  Patient Instructions  Will check Dementia lab including TSH, B12, ATN and ApoE4  Start Aricept 5 mg nightly, consider increasing to 10 mg nightly if able to tolerate medication  Start Namenda 10 gm twice daily  Continue to follow up with PCP  Return as needed

## 2023-12-11 NOTE — Telephone Encounter (Signed)
 Ms Christine Christensen returning phone, would like a call back.

## 2023-12-11 NOTE — Telephone Encounter (Signed)
 Christensen,Christine (legal guardian and Child psychotherapist) is asking for a call from RN to discuss prescribed medication from this appointment

## 2023-12-22 ENCOUNTER — Emergency Department (HOSPITAL_COMMUNITY)

## 2023-12-22 ENCOUNTER — Inpatient Hospital Stay (HOSPITAL_COMMUNITY)
Admission: EM | Admit: 2023-12-22 | Discharge: 2023-12-26 | DRG: 064 | Disposition: A | Discharge status: Hospice | Source: Skilled Nursing Facility | Attending: Family Medicine | Admitting: Family Medicine

## 2023-12-22 ENCOUNTER — Other Ambulatory Visit: Payer: Self-pay

## 2023-12-22 DIAGNOSIS — Z8249 Family history of ischemic heart disease and other diseases of the circulatory system: Secondary | ICD-10-CM

## 2023-12-22 DIAGNOSIS — I6310 Cerebral infarction due to embolism of unspecified precerebral artery: Secondary | ICD-10-CM | POA: Diagnosis not present

## 2023-12-22 DIAGNOSIS — Z1152 Encounter for screening for COVID-19: Secondary | ICD-10-CM

## 2023-12-22 DIAGNOSIS — I639 Cerebral infarction, unspecified: Secondary | ICD-10-CM | POA: Insufficient documentation

## 2023-12-22 DIAGNOSIS — I63511 Cerebral infarction due to unspecified occlusion or stenosis of right middle cerebral artery: Secondary | ICD-10-CM | POA: Diagnosis not present

## 2023-12-22 DIAGNOSIS — I214 Non-ST elevation (NSTEMI) myocardial infarction: Secondary | ICD-10-CM | POA: Diagnosis not present

## 2023-12-22 DIAGNOSIS — Z96641 Presence of right artificial hip joint: Secondary | ICD-10-CM | POA: Diagnosis present

## 2023-12-22 DIAGNOSIS — I4891 Unspecified atrial fibrillation: Secondary | ICD-10-CM | POA: Diagnosis present

## 2023-12-22 DIAGNOSIS — Z66 Do not resuscitate: Secondary | ICD-10-CM | POA: Diagnosis present

## 2023-12-22 DIAGNOSIS — Z993 Dependence on wheelchair: Secondary | ICD-10-CM

## 2023-12-22 DIAGNOSIS — Z880 Allergy status to penicillin: Secondary | ICD-10-CM

## 2023-12-22 DIAGNOSIS — I63513 Cerebral infarction due to unspecified occlusion or stenosis of bilateral middle cerebral arteries: Principal | ICD-10-CM | POA: Diagnosis present

## 2023-12-22 DIAGNOSIS — R000 Tachycardia, unspecified: Secondary | ICD-10-CM | POA: Diagnosis present

## 2023-12-22 DIAGNOSIS — R9431 Abnormal electrocardiogram [ECG] [EKG]: Secondary | ICD-10-CM | POA: Diagnosis not present

## 2023-12-22 DIAGNOSIS — Z96653 Presence of artificial knee joint, bilateral: Secondary | ICD-10-CM | POA: Diagnosis present

## 2023-12-22 DIAGNOSIS — R29726 NIHSS score 26: Secondary | ICD-10-CM

## 2023-12-22 DIAGNOSIS — R54 Age-related physical debility: Secondary | ICD-10-CM | POA: Diagnosis present

## 2023-12-22 DIAGNOSIS — Z1612 Extended spectrum beta lactamase (ESBL) resistance: Secondary | ICD-10-CM | POA: Diagnosis present

## 2023-12-22 DIAGNOSIS — R64 Cachexia: Secondary | ICD-10-CM | POA: Diagnosis present

## 2023-12-22 DIAGNOSIS — I272 Pulmonary hypertension, unspecified: Secondary | ICD-10-CM | POA: Diagnosis present

## 2023-12-22 DIAGNOSIS — I5032 Chronic diastolic (congestive) heart failure: Secondary | ICD-10-CM | POA: Diagnosis present

## 2023-12-22 DIAGNOSIS — I080 Rheumatic disorders of both mitral and aortic valves: Secondary | ICD-10-CM | POA: Diagnosis present

## 2023-12-22 DIAGNOSIS — Z79899 Other long term (current) drug therapy: Secondary | ICD-10-CM

## 2023-12-22 DIAGNOSIS — R402432 Glasgow coma scale score 3-8, at arrival to emergency department: Principal | ICD-10-CM

## 2023-12-22 DIAGNOSIS — I110 Hypertensive heart disease with heart failure: Secondary | ICD-10-CM | POA: Diagnosis present

## 2023-12-22 DIAGNOSIS — F0150 Vascular dementia without behavioral disturbance: Secondary | ICD-10-CM | POA: Diagnosis present

## 2023-12-22 DIAGNOSIS — R0682 Tachypnea, not elsewhere classified: Secondary | ICD-10-CM | POA: Diagnosis present

## 2023-12-22 DIAGNOSIS — I2510 Atherosclerotic heart disease of native coronary artery without angina pectoris: Secondary | ICD-10-CM | POA: Diagnosis present

## 2023-12-22 DIAGNOSIS — Z9049 Acquired absence of other specified parts of digestive tract: Secondary | ICD-10-CM

## 2023-12-22 DIAGNOSIS — I739 Peripheral vascular disease, unspecified: Secondary | ICD-10-CM | POA: Diagnosis not present

## 2023-12-22 DIAGNOSIS — Z7982 Long term (current) use of aspirin: Secondary | ICD-10-CM

## 2023-12-22 DIAGNOSIS — I69351 Hemiplegia and hemiparesis following cerebral infarction affecting right dominant side: Secondary | ICD-10-CM

## 2023-12-22 DIAGNOSIS — Z87440 Personal history of urinary (tract) infections: Secondary | ICD-10-CM

## 2023-12-22 DIAGNOSIS — F17210 Nicotine dependence, cigarettes, uncomplicated: Secondary | ICD-10-CM | POA: Diagnosis present

## 2023-12-22 DIAGNOSIS — Z515 Encounter for palliative care: Secondary | ICD-10-CM

## 2023-12-22 LAB — COMPREHENSIVE METABOLIC PANEL WITH GFR
ALT: 14 U/L (ref 0–44)
AST: 21 U/L (ref 15–41)
Albumin: 2.9 g/dL — ABNORMAL LOW (ref 3.5–5.0)
Alkaline Phosphatase: 66 U/L (ref 38–126)
Anion gap: 13 (ref 5–15)
BUN: 34 mg/dL — ABNORMAL HIGH (ref 8–23)
CO2: 29 mmol/L (ref 22–32)
Calcium: 8.9 mg/dL (ref 8.9–10.3)
Chloride: 97 mmol/L — ABNORMAL LOW (ref 98–111)
Creatinine, Ser: 1.55 mg/dL — ABNORMAL HIGH (ref 0.44–1.00)
GFR, Estimated: 34 mL/min — ABNORMAL LOW (ref >60)
Glucose, Bld: 115 mg/dL — ABNORMAL HIGH (ref 70–99)
Potassium: 3.5 mmol/L (ref 3.5–5.1)
Sodium: 139 mmol/L (ref 135–145)
Total Bilirubin: 0.6 mg/dL (ref 0.0–1.2)
Total Protein: 7.5 g/dL (ref 6.5–8.1)

## 2023-12-22 LAB — URINALYSIS, ROUTINE W REFLEX MICROSCOPIC
Bilirubin Urine: NEGATIVE
Glucose, UA: NEGATIVE mg/dL
Hgb urine dipstick: NEGATIVE
Ketones, ur: NEGATIVE mg/dL
Nitrite: NEGATIVE
Protein, ur: 30 mg/dL — AB
Specific Gravity, Urine: 1.025 (ref 1.005–1.030)
pH: 5.5 (ref 5.0–8.0)

## 2023-12-22 LAB — CBC WITH DIFFERENTIAL/PLATELET
Abs Immature Granulocytes: 0.1 10*3/uL — ABNORMAL HIGH (ref 0.00–0.07)
Basophils Absolute: 0 10*3/uL (ref 0.0–0.1)
Basophils Relative: 0 %
Eosinophils Absolute: 0 10*3/uL (ref 0.0–0.5)
Eosinophils Relative: 0 %
HCT: 34.6 % — ABNORMAL LOW (ref 36.0–46.0)
Hemoglobin: 11.5 g/dL — ABNORMAL LOW (ref 12.0–15.0)
Immature Granulocytes: 1 %
Lymphocytes Relative: 12 %
Lymphs Abs: 1.7 10*3/uL (ref 0.7–4.0)
MCH: 29.4 pg (ref 26.0–34.0)
MCHC: 33.2 g/dL (ref 30.0–36.0)
MCV: 88.5 fL (ref 80.0–100.0)
Monocytes Absolute: 1.3 10*3/uL — ABNORMAL HIGH (ref 0.1–1.0)
Monocytes Relative: 9 %
Neutro Abs: 11.1 10*3/uL — ABNORMAL HIGH (ref 1.7–7.7)
Neutrophils Relative %: 78 %
Platelets: 253 10*3/uL (ref 150–400)
RBC: 3.91 MIL/uL (ref 3.87–5.11)
RDW: 15.6 % — ABNORMAL HIGH (ref 11.5–15.5)
WBC: 14.2 10*3/uL — ABNORMAL HIGH (ref 4.0–10.5)
nRBC: 0 % (ref 0.0–0.2)

## 2023-12-22 LAB — I-STAT VENOUS BLOOD GAS, ED
Acid-Base Excess: 9 mmol/L — ABNORMAL HIGH (ref 0.0–2.0)
Bicarbonate: 33 mmol/L — ABNORMAL HIGH (ref 20.0–28.0)
Calcium, Ion: 1.06 mmol/L — ABNORMAL LOW (ref 1.15–1.40)
HCT: 35 % — ABNORMAL LOW (ref 36.0–46.0)
Hemoglobin: 11.9 g/dL — ABNORMAL LOW (ref 12.0–15.0)
O2 Saturation: 79 %
Patient temperature: 37
Potassium: 3.5 mmol/L (ref 3.5–5.1)
Sodium: 140 mmol/L (ref 135–145)
TCO2: 34 mmol/L — ABNORMAL HIGH (ref 22–32)
pCO2, Ven: 40.6 mmHg — ABNORMAL LOW (ref 44–60)
pH, Ven: 7.518 — ABNORMAL HIGH (ref 7.25–7.43)
pO2, Ven: 39 mmHg (ref 32–45)

## 2023-12-22 LAB — URINALYSIS, MICROSCOPIC (REFLEX)

## 2023-12-22 LAB — I-STAT CHEM 8, ED
BUN: 33 mg/dL — ABNORMAL HIGH (ref 8–23)
Calcium, Ion: 1.04 mmol/L — ABNORMAL LOW (ref 1.15–1.40)
Chloride: 98 mmol/L (ref 98–111)
Creatinine, Ser: 1.7 mg/dL — ABNORMAL HIGH (ref 0.44–1.00)
Glucose, Bld: 117 mg/dL — ABNORMAL HIGH (ref 70–99)
HCT: 35 % — ABNORMAL LOW (ref 36.0–46.0)
Hemoglobin: 11.9 g/dL — ABNORMAL LOW (ref 12.0–15.0)
Potassium: 3.5 mmol/L (ref 3.5–5.1)
Sodium: 139 mmol/L (ref 135–145)
TCO2: 30 mmol/L (ref 22–32)

## 2023-12-22 LAB — RESP PANEL BY RT-PCR (RSV, FLU A&B, COVID)  RVPGX2
Influenza A by PCR: NEGATIVE
Influenza B by PCR: NEGATIVE
Resp Syncytial Virus by PCR: NEGATIVE
SARS Coronavirus 2 by RT PCR: NEGATIVE

## 2023-12-22 LAB — I-STAT CG4 LACTIC ACID, ED: Lactic Acid, Venous: 2.7 mmol/L (ref 0.5–1.9)

## 2023-12-22 LAB — TROPONIN I (HIGH SENSITIVITY): Troponin I (High Sensitivity): 1504 ng/L (ref <18)

## 2023-12-22 LAB — ETHANOL: Alcohol, Ethyl (B): <10 mg/dL (ref <10)

## 2023-12-22 LAB — I-STAT CREATININE, ED: Creatinine, Ser: 1.7 mg/dL — ABNORMAL HIGH (ref 0.44–1.00)

## 2023-12-22 LAB — AMMONIA: Ammonia: 41 umol/L — ABNORMAL HIGH (ref 9–35)

## 2023-12-22 MED ORDER — HYDROMORPHONE HCL 1 MG/ML IJ SOLN
0.5000 mg | Freq: Once | INTRAMUSCULAR | Status: AC
  Administered 2023-12-22: 0.5 mg via INTRAVENOUS
  Filled 2023-12-22: qty 1

## 2023-12-22 MED ORDER — GLYCOPYRROLATE 0.2 MG/ML IJ SOLN
0.2000 mg | Freq: Once | INTRAMUSCULAR | Status: AC
  Administered 2023-12-22: 0.2 mg via INTRAVENOUS
  Filled 2023-12-22: qty 1

## 2023-12-22 MED ORDER — SODIUM CHLORIDE 0.9 % IV SOLN
1.0000 g | Freq: Once | INTRAVENOUS | Status: DC
  Filled 2023-12-22: qty 20

## 2023-12-22 MED ORDER — SODIUM CHLORIDE 0.9 % IV BOLUS
1000.0000 mL | Freq: Once | INTRAVENOUS | Status: AC
  Administered 2023-12-22: 1000 mL via INTRAVENOUS

## 2023-12-22 MED ORDER — IOHEXOL 350 MG/ML SOLN
75.0000 mL | Freq: Once | INTRAVENOUS | Status: AC | PRN
  Administered 2023-12-22: 75 mL via INTRAVENOUS

## 2023-12-22 NOTE — ED Triage Notes (Signed)
 Patient bib [facility] from [facility] with complaints of being unresponsive. Baseline patient is GCS of 14 currently with EMS she is 7. Only withdraws from pain. EMS reports she is tachycardic 150-170s and is showing elevation on EKG.   Patient is lying in bed with eyes open on arrival, not communicating and only withdrawing and slightly moaning to pain.

## 2023-12-22 NOTE — ED Notes (Signed)
 Paged By Rolland Bimler MD Jacinto Halim to [place]

## 2023-12-22 NOTE — ED Provider Notes (Signed)
  Physical Exam  BP 91/77   Pulse 70   Temp 97.7 F (36.5 C) (Oral)   Resp 16   SpO2 97%   Physical Exam  Procedures  Procedures  ED Course / MDM   Clinical Course as of 12/23/23 1438  Sat Dec 22, 2023  2053 Assumed care. 78 yo F with hx of stroke, chf, esbl uti, and dementia who presented from [facility] and was not moving and is non-verbal currently. WC bound at baseline. Tracks on R side without ability to cross midline. Withdraws all 4 extremities to pain. Code stemi initally then cancelled. Discussed with Dr Wilford Corner from neurology and based on functional status and unknown LKW not a candidate for intervention for stroke interventions. Recommends MRI and EEG. Is DNR. Several admissions for AMS. Recently had UTI causing it.  [RP]  2138 UA appears to be consistent with urinary tract infection.  Also appears the patient may have acute kidney injury.  Ammonia slightly elevated as well.  Based on history of ESBL we will start on meropenem at this time. [RP]  2241 Went to reevaluate the patient.  Unfortunately she was taken away to CT and MRI. DNR/DNI confirmed with son Apolinar Junes.  Reports that she has been confused since at least yesterday at 4 PM when he saw her.  But not complaining of any chest pain or shortness of breath.  [RP]  2250 Dr Jacinto Halim from cardiology consulted regarding the uptrending troponin and NSTEMI. Does not feel that she is a candidate for LHC given her functional status.  Feels that she should likely be comfort care if admitted. [RP]  2304 CT shows right M1 occlusion.  Dr. Jerrell Belfast from neurology consulted.  Will see the patient.  Not a candidate for any acute interventions at this time.  Recommends getting the MRI. [RP]  2336 Had thorough discussion with her son Apolinar Junes at the bedside with Dr. Jerrell Belfast.  Patient has been made comfort care at this time since her prognosis is very poor and her functional status is poor at baseline.  Will admit to the hospitalist for comfort care.   Ordered for glycopyrrolate and Dilaudid.  Will hold off on any additional labs or IV medication. [RP]  2341 Dr Gasper Sells from hospitalist to evaluate for admission.  [RP]    Clinical Course User Index [RP] Rondel Baton, MD   Medical Decision Making Amount and/or Complexity of Data Reviewed Labs: ordered. Radiology: ordered.  Risk Prescription drug management. Decision regarding hospitalization.      Rondel Baton, MD 12/23/23 [phone]

## 2023-12-22 NOTE — Consult Note (Signed)
 Cardiology Consultation   Patient ID: Christine Christensen MRN: [id number]; DOB: December 11, 1945  Admit date: 12/22/2023 Date of Consult: 12/22/2023  PCP:  Christine Contras, MD   [facility] Providers Cardiologist:  None        Patient Profile:   Christine Christensen is a 78 y.o. female with a hx of advanced dementia who is being seen 12/22/2023 for the evaluation of mental status change at the request of ED.  History of Present Illness:   Christine Christensen was found by nursing staff at her long term facility to be progressively less responsive over the day, and then nonresponsive to sternal rub tonight.   When EMS picked her up, a STEMI code was activated with ECG showing ST elevations in all leads except for I, aVL and V1; this was different from her prior ECG on 3/21.  She was tachycardic, non verbal, not able to respond to any commands.  BP was stable.  She had conflicting information re code status, with one form signed by a legal guardian of the state, another form that said she was DNAR, and another form stating her [facility] POA was her son.  I called her son, Christine Christensen, and after sometime, he was able to confirm that he was her POA and that her living will states a preference to remain DNAR.  Bedside POCUS was challenging without clear windows, but LV looks hyperdynamic grossly.  Patient not able to provide any history. Per EMS report, patient is usually not oriented to person, time or place, but verbal, and usually yelling at people in the nursing facility.    Past Medical History:  Diagnosis Date   Anemia    history of anemia   Aortic stenosis    mild aortic stenosis 07/27/17 echo   Arthritis    Heart murmur    Hypertension    Mitral stenosis    moderate mitral stenosis 07/27/17 echo   Stroke [facility])    stroke x2, 2005 and 2008, some right sided weakness   Syncope and collapse 10/25/2017   Urinary incontinence     Past Surgical History:  Procedure Laterality Date   CATARACT EXTRACTION,  BILATERAL Bilateral    CHOLECYSTECTOMY     HIP ARTHROPLASTY Right 06/14/2021   Procedure: ARTHROPLASTY BIPOLAR HIP (HEMIARTHROPLASTY);  Surgeon: Joen Laura, MD;  Location: [facility] OR;  Service: Orthopedics;  Laterality: Right;   TONSILLECTOMY     TOTAL KNEE ARTHROPLASTY Right    right knee 2006   TOTAL KNEE ARTHROPLASTY Left 08/06/2017   Procedure: LEFT TOTAL KNEE ARTHROPLASTY;  Surgeon: Dannielle Huh, MD;  Location: [facility] OR;  Service: Orthopedics;  Laterality: Left;   TUBAL LIGATION         Inpatient Medications: Scheduled Meds:  Continuous Infusions:  meropenem (MERREM) IV     PRN Meds:   Allergies:    Allergies  Allergen Reactions   Penicillins Other (See Comments)    Unknown reaction    Social History:   Social History   Socioeconomic History   Marital status: Divorced    Spouse name: Not on file   Number of children: 2   Years of education: 14   Highest education level: Not on file  Occupational History   Not on file  Tobacco Use   Smoking status: Former    Current packs/day: 0.50    Average packs/day: 0.5 packs/day for 56.0 years (28.0 ttl pk-yrs)    Types: Cigarettes   Smokeless tobacco: Never  Vaping Use  Vaping status: Never Used  Substance and Sexual Activity   Alcohol use: Not Currently    Comment: Sometimes   Drug use: Not Currently    Types: Marijuana   Sexual activity: Not on file  Other Topics Concern   Not on file  Social History Narrative   Lives with son   Caffeine use: Drinks coffee 3 times weekly, Iced tea sometimes   Right handed    Social Drivers of Corporate investment banker Strain: Not on file  Food Insecurity: No Food Insecurity (11/01/2023)   Hunger Vital Sign    Worried About Running Out of Food in the Last Year: Never true    Ran Out of Food in the Last Year: Never true  Transportation Needs: No Transportation Needs (11/01/2023)   PRAPARE - Administrator, Civil Service (Medical): No    Lack of  Transportation (Non-Medical): No  Physical Activity: Not on file  Stress: Not on file  Social Connections: Patient Unable To Answer (11/01/2023)   Social Connection and Isolation Panel [NHANES]    Frequency of Communication with Friends and Family: Patient unable to answer    Frequency of Social Gatherings with Friends and Family: Patient unable to answer    Attends Religious Services: Patient unable to answer    Active Member of Clubs or Organizations: Patient unable to answer    Attends Banker Meetings: Patient unable to answer    Marital Status: Patient unable to answer  Intimate Partner Violence: Not At Risk (11/01/2023)   Humiliation, Afraid, Rape, and Kick questionnaire    Fear of Current or Ex-Partner: No    Emotionally Abused: No    Physically Abused: No    Sexually Abused: No    Family History:    Family History  Problem Relation Age of Onset   Dementia Mother    Heart disease Father    Cerebral palsy Sister      ROS:  Please see the history of present illness.   All other ROS reviewed and negative.     Physical Exam/Data:   Vitals:   12/22/23 2022 12/22/23 2028 12/22/23 2030  BP:  110/66 91/77  Pulse:  79 70  Resp:  (!) 21 16  Temp:  97.7 F (36.5 C)   TempSrc:  Oral   SpO2: 98% 98% 97%   No intake or output data in the 24 hours ending 12/22/23 2149    12/09/2023    4:09 AM 12/08/2023    2:55 AM 12/05/2023   10:05 PM  Last 3 Weights  Weight (lbs) 127 lb 13.9 oz 126 lb 8.7 oz 128 lb  Weight (kg) 58 kg 57.4 kg 58.06 kg     There is no height or weight on file to calculate BMI.  General:  Elderly woman, unresponsive to commands, grunts to ultrasound probe held to her chest. HEENT: dry mucosa Neck: no overt JVD Vascular: Distal pulses palpable bilaterally Cardiac:  tachy S1, S2; RRR; no murmur appreciated Lungs:  clear to auscultation anteriorly Abd: soft, nontender, no hepatomegaly  Ext: no edema, warm Musculoskeletal:  No deformities,  BUE and BLE strength normal and equal Skin: warm and dry    EKG:  The EKG was personally reviewed and demonstrates:  AF/FL with STE in II, III, F, V3-6 Telemetry:  Telemetry was personally reviewed and demonstrates:  irregular rhythm  Relevant CV Studies: Limited echo in 10/2023 showed preserved EF.  Laboratory Data:  Chemistry Recent Labs  Lab  12/22/23 2019 12/22/23 2033 12/22/23 2035 12/22/23 2037  NA 139 140  --  139  K 3.5 3.5  --  3.5  CL 98  --   --  97*  CO2  --   --   --  29  GLUCOSE 117*  --   --  115*  BUN 33*  --   --  34*  CREATININE 1.70*  --  1.70* 1.55*  CALCIUM  --   --   --  8.9  GFRNONAA  --   --   --  34*  ANIONGAP  --   --   --  13    Recent Labs  Lab 12/22/23 2037  PROT 7.5  ALBUMIN 2.9*  AST 21  ALT 14  ALKPHOS 66  BILITOT 0.6   Lipids No results for input(s): "CHOL", "TRIG", "HDL", "LABVLDL", "LDLCALC", "CHOLHDL" in the last 168 hours.  Hematology Recent Labs  Lab 12/22/23 2019 12/22/23 2033 12/22/23 2045  WBC  --   --  14.2*  RBC  --   --  3.91  HGB 11.9* 11.9* 11.5*  HCT 35.0* 35.0* 34.6*  MCV  --   --  88.5  MCH  --   --  29.4  MCHC  --   --  33.2  RDW  --   --  15.6*  PLT  --   --  253   Thyroid No results for input(s): "TSH", "FREET4" in the last 168 hours.  BNPNo results for input(s): "BNP", "PROBNP" in the last 168 hours.  DDimer No results for input(s): "DDIMER" in the last 168 hours.  VBG 7.52/41/39 Lactate 2.7 Cr 1.7 Alb 2.9  Radiology/Studies:  DG Chest Port 1 View Result Date: 12/22/2023 CLINICAL DATA:  Altered mental status EXAM: PORTABLE CHEST 1 VIEW COMPARISON:  12/05/2023 FINDINGS: Stable pulmonary insufflation. Interval development of retrocardiac infiltrate, possibly infectious in the acute setting. No pneumothorax or pleural effusion. Cardiac size within normal limits. No acute bone abnormality. IMPRESSION: 1. Retrocardiac infiltrate, possibly infectious in the acute setting. Electronically Signed   By: Helyn Numbers M.D.   On: 12/22/2023 20:49     Assessment and Plan:  This is not a typical ECG for STEMI, and given her baseline comorbidities, likely preserved EF on bedside ultrasound, DNAR status, and QoL, I discussed with Dr. Jacinto Halim (the interventionalist), and canceled the code STEMI.    In speaking with her son, I mentioned that the medical team will check to see if there's anything medically we can find that might help her recover.  However, the risk of cardiac catheterization would outweigh any benefit, and so we would not recommend any invasive testing or treatment  Troponin level is pending. Her WBC is elevated, UA is hazy, and there might be an infiltrate on exam, so an infectious source may be likely.  From a cardiac standpoint, would check troponin levels, and if elevated (her prior troponin levels were around 50 in Feb, so I would consider elevated as being much higher than that), can consider heparinization.  Her tachycardia in Afib may be driven by other issues, and she can be gently rate controlled. With a preserved EF, either CCB or BB are ok. I doubt she's a long term anticoagulant candidate.  She is DNAR per confirmation by her son.   Risk Assessment/Risk Scores:          CHA2DS2-VASc Score =     This indicates a  % annual risk of stroke. The patient's  score is based upon:           For questions or updates, please contact [facility] Please consult [email or web address] for contact info under    Signed, Eyvonne Left, MD  12/22/2023 9:49 PM   Addendum 12/23/23 at 1:20am, labs returned with troponin of 1504.  Also further evaluation includes the possibility of an acute stroke.  In this setting, there does not seem to be benefit > risk for heparinization, so I would recommend not starting heparin.

## 2023-12-22 NOTE — ED Notes (Signed)
 Called CCMD to get patient monitored.

## 2023-12-22 NOTE — ED Provider Notes (Signed)
 [place] EMERGENCY DEPARTMENT AT [facility] Provider Note   CSN: [id number] Arrival date & time: 12/22/23  2017     History  Chief Complaint  Patient presents with   Code STEMI    Christine Christensen is a 78 y.o. female.  78 yo F with a complaints of altered mental status.  The patient was reportedly normal this morning and then had progressive confusion.  Became unresponsive and EMS was called.  There was some concern for ST elevation on her EKG on arrival.  She was made a code STEMI.  This was canceled by cardiology.        Home Medications Prior to Admission medications   Medication Sig Start Date End Date Taking? Authorizing Provider  acetaminophen (TYLENOL) 500 MG tablet Take 2 tablets (1,000 mg total) by mouth every 6 (six) hours as needed for mild pain, moderate pain, fever or headache. 06/25/21   Regalado, Belkys A, MD  albuterol (VENTOLIN HFA) 108 (90 Base) MCG/ACT inhaler Inhale 2 puffs into the lungs every 4 (four) hours as needed for wheezing or shortness of breath. 11/04/21   Leroy Sea, MD  aspirin 325 MG tablet Take 1 tablet (325 mg total) by mouth daily. 06/25/21   Regalado, Belkys A, MD  busPIRone (BUSPAR) 5 MG tablet Take 5 mg by mouth 3 (three) times daily. 09/11/23   [provider]  donepezil (ARICEPT) 5 MG tablet Take 5 mg by mouth in the morning and at bedtime. 09/27/23   [provider]  escitalopram (LEXAPRO) 20 MG tablet Take 20 mg by mouth every morning.    [provider]  LACTOBACILLUS PO Take 1 capsule by mouth in the morning and at bedtime.    [provider]  melatonin 3 MG TABS tablet Take 1 tablet (3 mg total) by mouth at bedtime. 06/25/21   Regalado, Belkys A, MD  memantine (NAMENDA) 10 MG tablet Take 10 mg by mouth 2 (two) times daily. 10/04/23   [provider]  Multiple Vitamin (MULTIVITAMIN WITH MINERALS) TABS tablet Take 1 tablet by mouth daily. 06/25/21   Regalado, Belkys A, MD   polyethylene glycol (MIRALAX / GLYCOLAX) 17 g packet Take 17 g by mouth 2 (two) times daily. 06/25/21   Regalado, Belkys A, MD  senna-docusate (SENOKOT-S) 8.6-50 MG tablet Take 1 tablet by mouth 2 (two) times daily. 06/25/21   Regalado, Belkys A, MD  spironolactone (ALDACTONE) 25 MG tablet Take 1 tablet (25 mg total) by mouth daily. 11/09/23   Zannie Cove, MD  torsemide (DEMADEX) 20 MG tablet Take 1 tablet (20 mg total) by mouth daily. 11/09/23   Zannie Cove, MD      Allergies    Penicillins    Review of Systems   Review of Systems  Physical Exam Updated Vital Signs BP 91/77   Pulse 70   Temp 97.7 F (36.5 C) (Oral)   Resp 16   SpO2 97%  Physical Exam Vitals and nursing note reviewed.  Constitutional:      General: She is not in acute distress.    Appearance: She is well-developed. She is not diaphoretic.  HENT:     Head: Normocephalic and atraumatic.  Eyes:     Pupils: Pupils are equal, round, and reactive to light.  Cardiovascular:     Rate and Rhythm: Normal rate and regular rhythm.     Heart sounds: No murmur heard.    No friction rub. No gallop.  Pulmonary:  Effort: Pulmonary effort is normal.     Breath sounds: No wheezing or rales.  Abdominal:     General: There is no distension.     Palpations: Abdomen is soft.     Tenderness: There is no abdominal tenderness.  Musculoskeletal:        General: No tenderness.     Cervical back: Normal range of motion and neck supple.  Skin:    General: Skin is warm and dry.  Neurological:     Mental Status: She is alert and oriented to person, place, and time.     Comments: Patient will not blink to threat or have her eyes cross midline to the left.  She will track my finger on her right visual field.  She groans.  She will try to withdraw from pain in all 4 extremities.  Psychiatric:        Behavior: Behavior normal.     ED Results / Procedures / Treatments   Labs (all labs ordered are listed, but only abnormal  results are displayed) Labs Reviewed  CBC WITH DIFFERENTIAL/PLATELET - Abnormal; Notable for the following components:      Result Value   WBC 14.2 (*)    Hemoglobin 11.5 (*)    HCT 34.6 (*)    RDW 15.6 (*)    Neutro Abs 11.1 (*)    Monocytes Absolute 1.3 (*)    Abs Immature Granulocytes 0.10 (*)    All other components within normal limits  I-STAT CG4 LACTIC ACID, ED - Abnormal; Notable for the following components:   Lactic Acid, Venous 2.7 (*)    All other components within normal limits  I-STAT VENOUS BLOOD GAS, ED - Abnormal; Notable for the following components:   pH, Ven 7.518 (*)    pCO2, Ven 40.6 (*)    Bicarbonate 33.0 (*)    TCO2 34 (*)    Acid-Base Excess 9.0 (*)    Calcium, Ion 1.06 (*)    HCT 35.0 (*)    Hemoglobin 11.9 (*)    All other components within normal limits  I-STAT CREATININE, ED - Abnormal; Notable for the following components:   Creatinine, Ser 1.70 (*)    All other components within normal limits  I-STAT CHEM 8, ED - Abnormal; Notable for the following components:   BUN 33 (*)    Creatinine, Ser 1.70 (*)    Glucose, Bld 117 (*)    Calcium, Ion 1.04 (*)    Hemoglobin 11.9 (*)    HCT 35.0 (*)    All other components within normal limits  CULTURE, BLOOD (ROUTINE X 2)  CULTURE, BLOOD (ROUTINE X 2)  RESP PANEL BY RT-PCR (RSV, FLU A&B, COVID)  RVPGX2  AMMONIA  COMPREHENSIVE METABOLIC PANEL WITH GFR  RAPID URINE DRUG SCREEN, HOSP PERFORMED  ETHANOL  CBC WITH DIFFERENTIAL/PLATELET  URINALYSIS, ROUTINE W REFLEX MICROSCOPIC  CBG MONITORING, ED  TROPONIN I (HIGH SENSITIVITY)    EKG None  Radiology DG Chest Port 1 View Result Date: 12/22/2023 CLINICAL DATA:  Altered mental status EXAM: PORTABLE CHEST 1 VIEW COMPARISON:  12/05/2023 FINDINGS: Stable pulmonary insufflation. Interval development of retrocardiac infiltrate, possibly infectious in the acute setting. No pneumothorax or pleural effusion. Cardiac size within normal limits. No acute bone  abnormality. IMPRESSION: 1. Retrocardiac infiltrate, possibly infectious in the acute setting. Electronically Signed   By: Helyn Numbers M.D.   On: 12/22/2023 20:49    Procedures .Critical Care  Performed by: Melene Plan, DO Authorized by: Melene Plan,  DO   Critical care provider statement:    Critical care time (minutes):  35   Critical care time was exclusive of:  Separately billable procedures and treating other patients   Critical care was time spent personally by me on the following activities:  Development of treatment plan with patient or surrogate, discussions with consultants, evaluation of patient's response to treatment, examination of patient, ordering and review of laboratory studies, ordering and review of radiographic studies, ordering and performing treatments and interventions, pulse oximetry, re-evaluation of patient's condition and review of old charts   Care discussed with: admitting provider       Medications Ordered in ED Medications  sodium chloride 0.9 % bolus 1,000 mL (has no administration in time range)    ED Course/ Medical Decision Making/ A&P Clinical Course as of 12/22/23 2106  Sat Dec 22, 2023  2053 Assumed care. 78 yo F with hx of stroke and mental illness who presented from [facility] and was not moving and is non-verbal currently. WC bound at baseline. Tracks on R side without ability to cross midline. Withdraws all 4 extremities to pain. Code stemi initally then cancelled. Discussed with Dr Wilford Corner from neurology and based on functional status and unknown LKW not a candidate for intervention for stroke interventions. Recommends MRI and EEG. Is DNR. Several admissions for AMS. Recently had UTI causing it.  [RP]    Clinical Course User Index [RP] Rondel Baton, MD                                 Medical Decision Making Amount and/or Complexity of Data Reviewed Labs: ordered. Radiology: ordered.   78 yo F with a chief complaints of altered  mental status.  Timeline is not well-known.  Reportedly was normal this morning.  Progressive worsening altered mental status since at least lunchtime.  She does have a physical exam that somewhat concerning for stroke with a right-sided gaze preference.  Her initial EKG is concerning for an anterior inferior STEMI.  This was canceled by the interventional cardiologist.  The patient has some concerning signs for stroke I discussed this with neurology, Dr. Wilford Corner, recommended CT CTA head and neck.  Did not feel that the patient would qualify for thrombectomy or thrombolytics.  Did not feel it would be of benefit to make it a code stroke.  Will obtain an altered mental status workup.  Bolus of IV fluids.  Reassess.  Signed out to Dr. Jarold Motto, please see their note for further details of care in the ED.  The patients results and plan were reviewed and discussed.   Any x-rays performed were independently reviewed by myself.   Differential diagnosis were considered with the presenting HPI.  Medications  sodium chloride 0.9 % bolus 1,000 mL (has no administration in time range)    Vitals:   12/22/23 2022 12/22/23 2028 12/22/23 2030  BP:  110/66 91/77  Pulse:  79 70  Resp:  (!) 21 16  Temp:  97.7 F (36.5 C)   TempSrc:  Oral   SpO2: 98% 98% 97%    Final diagnoses:  Glasgow coma scale total score 3-8, at arrival to emergency department [facility])    Admission/ observation were discussed with the admitting physician, patient and/or family and they are comfortable with the plan.          Final Clinical Impression(s) / ED Diagnoses Final diagnoses:  Glasgow coma  scale total score 3-8, at arrival to emergency department [facility])    Rx / DC Orders ED Discharge Orders     None         Melene Plan, DO 12/22/23 2106

## 2023-12-22 NOTE — H&P (Incomplete)
 History and Physical    Patient: Christine Christensen ZOX:[id number] DOB: 03/18/1946 DOA: 12/22/2023 DOS: the patient was seen and examined on 12/22/2023 PCP: Fleet Contras, MD  Patient coming from: {Point_of_Origin:[address]}  Chief Complaint:  Chief Complaint  Patient presents with   Code STEMI   HPI: Christine Christensen is a 78 y.o. female with medical history significant of ***       Review of Systems: {ROS_Text:26778} Past Medical History:  Diagnosis Date   Anemia    history of anemia   Aortic stenosis    mild aortic stenosis 07/27/17 echo   Arthritis    Heart murmur    Hypertension    Mitral stenosis    moderate mitral stenosis 07/27/17 echo   Stroke [facility])    stroke x2, 2005 and 2008, some right sided weakness   Syncope and collapse 10/25/2017   Urinary incontinence    Past Surgical History:  Procedure Laterality Date   CATARACT EXTRACTION, BILATERAL Bilateral    CHOLECYSTECTOMY     HIP ARTHROPLASTY Right 06/14/2021   Procedure: ARTHROPLASTY BIPOLAR HIP (HEMIARTHROPLASTY);  Surgeon: Joen Laura, MD;  [facility]: [facility];  Service: Orthopedics;  Laterality: Right;   TONSILLECTOMY     TOTAL KNEE ARTHROPLASTY Right    right knee 2006   TOTAL KNEE ARTHROPLASTY Left 08/06/2017   Procedure: LEFT TOTAL KNEE ARTHROPLASTY;  Surgeon: Dannielle Huh, MD;  Location: [facility] OR;  Service: Orthopedics;  Laterality: Left;   TUBAL LIGATION     Social History:  reports that she has quit smoking. Her smoking use included cigarettes. She has a 28 pack-year smoking history. She has never used smokeless tobacco. She reports that she does not currently use alcohol. She reports that she does not currently use drugs after having used the following drugs: Marijuana.  Allergies  Allergen Reactions   Penicillins Other (See Comments)    Unknown reaction    Family History  Problem Relation Age of Onset   Dementia Mother    Heart disease Father    Cerebral palsy Sister     Prior to Admission  medications   Medication Sig Start Date End Date Taking? Authorizing Provider  acetaminophen (TYLENOL) 500 MG tablet Take 2 tablets (1,000 mg total) by mouth every 6 (six) hours as needed for mild pain, moderate pain, fever or headache. 06/25/21   Regalado, Belkys A, MD  albuterol (VENTOLIN HFA) 108 (90 Base) MCG/ACT inhaler Inhale 2 puffs into the lungs every 4 (four) hours as needed for wheezing or shortness of breath. 11/04/21   Leroy Sea, MD  aspirin 325 MG tablet Take 1 tablet (325 mg total) by mouth daily. 06/25/21   Regalado, Belkys A, MD  busPIRone (BUSPAR) 5 MG tablet Take 5 mg by mouth 3 (three) times daily. 09/11/23   [provider]  divalproex (DEPAKOTE SPRINKLE) 125 MG capsule Take 125 mg by mouth 2 (two) times daily. 12/14/23   [provider]  donepezil (ARICEPT) 5 MG tablet Take 5 mg by mouth in the morning and at bedtime. 09/27/23   [provider]  escitalopram (LEXAPRO) 20 MG tablet Take 20 mg by mouth every morning.    [provider]  LACTOBACILLUS PO Take 1 capsule by mouth in the morning and at bedtime.    [provider]  melatonin 3 MG TABS tablet Take 1 tablet (3 mg total) by mouth at bedtime. 06/25/21   Regalado, Belkys A, MD  memantine (NAMENDA) 10 MG tablet Take 10 mg by  mouth 2 (two) times daily. 10/04/23   [provider]  Multiple Vitamin (MULTIVITAMIN WITH MINERALS) TABS tablet Take 1 tablet by mouth daily. 06/25/21   Regalado, Belkys A, MD  polyethylene glycol (MIRALAX / GLYCOLAX) 17 g packet Take 17 g by mouth 2 (two) times daily. 06/25/21   Regalado, Belkys A, MD  senna-docusate (SENOKOT-S) 8.6-50 MG tablet Take 1 tablet by mouth 2 (two) times daily. 06/25/21   Regalado, Belkys A, MD  spironolactone (ALDACTONE) 25 MG tablet Take 1 tablet (25 mg total) by mouth daily. 11/09/23   Zannie Cove, MD  torsemide (DEMADEX) 20 MG tablet Take 1 tablet (20 mg total) by mouth daily. 11/09/23   Zannie Cove, MD     Physical Exam: Vitals:   12/22/23 2028 12/22/23 2030 12/22/23 2145 12/22/23 2200  BP: 110/66 91/77 116/77 112/89  Pulse: 79 70 (!) 158 78  Resp: (!) 21 16 15 15   Temp: 97.7 F (36.5 C)     TempSrc: Oral     SpO2: 98% 97% 100% 100%   *** Data Reviewed: {Tip this will not be part of the note when signed- Document your independent interpretation of telemetry tracing, EKG, lab, Radiology test or any other diagnostic tests. Add any new diagnostic test ordered today. (Optional):26781} Results for orders placed or performed during the hospital encounter of 12/22/23 (from the past 24 hours)  I-stat chem 8, ed     Status: Abnormal   Collection Time: 12/22/23  8:19 PM  Result Value Ref Range   Sodium 139 135 - 145 mmol/L   Potassium 3.5 3.5 - 5.1 mmol/L   Chloride 98 98 - 111 mmol/L   BUN 33 (H) 8 - 23 mg/dL   Creatinine, Ser 3.08 (H) 0.44 - 1.00 mg/dL   Glucose, Bld 657 (H) 70 - 99 mg/dL   Calcium, Ion 8.46 (L) 1.15 - 1.40 mmol/L   TCO2 30 22 - 32 mmol/L   Hemoglobin 11.9 (L) 12.0 - 15.0 g/dL   HCT 96.2 (L) 95.2 - 84.1 %  I-Stat Lactic Acid, ED     Status: Abnormal   Collection Time: 12/22/23  8:33 PM  Result Value Ref Range   Lactic Acid, Venous 2.7 (HH) 0.5 - 1.9 mmol/L   Comment NOTIFIED PHYSICIAN   I-Stat venous blood gas, ED     Status: Abnormal   Collection Time: 12/22/23  8:33 PM  Result Value Ref Range   pH, Ven 7.518 (H) 7.25 - 7.43   pCO2, Ven 40.6 (L) 44 - 60 mmHg   pO2, Ven 39 32 - 45 mmHg   Bicarbonate 33.0 (H) 20.0 - 28.0 mmol/L   TCO2 34 (H) 22 - 32 mmol/L   O2 Saturation 79 %   Acid-Base Excess 9.0 (H) 0.0 - 2.0 mmol/L   Sodium 140 135 - 145 mmol/L   Potassium 3.5 3.5 - 5.1 mmol/L   Calcium, Ion 1.06 (L) 1.15 - 1.40 mmol/L   HCT 35.0 (L) 36.0 - 46.0 %   Hemoglobin 11.9 (L) 12.0 - 15.0 g/dL   Patient temperature 32.4 C    Sample type VENOUS    Comment NOTIFIED PHYSICIAN   I-Stat Creatinine, ED (not at [facility] or [facility])     Status: Abnormal   Collection Time:  12/22/23  8:35 PM  Result Value Ref Range   Creatinine, Ser 1.70 (H) 0.44 - 1.00 mg/dL  Comprehensive metabolic panel     Status: Abnormal   Collection Time: 12/22/23  8:37 PM  Result Value  Ref Range   Sodium 139 135 - 145 mmol/L   Potassium 3.5 3.5 - 5.1 mmol/L   Chloride 97 (L) 98 - 111 mmol/L   CO2 29 22 - 32 mmol/L   Glucose, Bld 115 (H) 70 - 99 mg/dL   BUN 34 (H) 8 - 23 mg/dL   Creatinine, Ser 8.29 (H) 0.44 - 1.00 mg/dL   Calcium 8.9 8.9 - 56.2 mg/dL   Total Protein 7.5 6.5 - 8.1 g/dL   Albumin 2.9 (L) 3.5 - 5.0 g/dL   AST 21 15 - 41 U/L   ALT 14 0 - 44 U/L   Alkaline Phosphatase 66 38 - 126 U/L   Total Bilirubin 0.6 0.0 - 1.2 mg/dL   GFR, Estimated 34 (L) >60 mL/min   Anion gap 13 5 - 15  Ethanol     Status: None   Collection Time: 12/22/23  8:37 PM  Result Value Ref Range   Alcohol, Ethyl (B) <10 <10 mg/dL  Troponin I (High Sensitivity)     Status: Abnormal   Collection Time: 12/22/23  8:37 PM  Result Value Ref Range   Troponin I (High Sensitivity) 1,504 (HH) <18 ng/L  Ammonia     Status: Abnormal   Collection Time: 12/22/23  8:40 PM  Result Value Ref Range   Ammonia 41 (H) 9 - 35 umol/L  CBC with Differential/Platelet     Status: Abnormal   Collection Time: 12/22/23  8:45 PM  Result Value Ref Range   WBC 14.2 (H) 4.0 - 10.5 K/uL   RBC 3.91 3.87 - 5.11 MIL/uL   Hemoglobin 11.5 (L) 12.0 - 15.0 g/dL   HCT 13.0 (L) 86.5 - 78.4 %   MCV 88.5 80.0 - 100.0 fL   MCH 29.4 26.0 - 34.0 pg   MCHC 33.2 30.0 - 36.0 g/dL   RDW 69.6 (H) 29.5 - 28.4 %   Platelets 253 150 - 400 K/uL   nRBC 0.0 0.0 - 0.2 %   Neutrophils Relative % 78 %   Neutro Abs 11.1 (H) 1.7 - 7.7 K/uL   Lymphocytes Relative 12 %   Lymphs Abs 1.7 0.7 - 4.0 K/uL   Monocytes Relative 9 %   Monocytes Absolute 1.3 (H) 0.1 - 1.0 K/uL   Eosinophils Relative 0 %   Eosinophils Absolute 0.0 0.0 - 0.5 K/uL   Basophils Relative 0 %   Basophils Absolute 0.0 0.0 - 0.1 K/uL   Immature Granulocytes 1 %   Abs  Immature Granulocytes 0.10 (H) 0.00 - 0.07 K/uL  Resp panel by RT-PCR (RSV, Flu A&B, Covid) Anterior Nasal Swab     Status: None   Collection Time: 12/22/23  9:00 PM   Specimen: Anterior Nasal Swab  Result Value Ref Range   SARS Coronavirus 2 by RT PCR NEGATIVE NEGATIVE   Influenza A by PCR NEGATIVE NEGATIVE   Influenza B by PCR NEGATIVE NEGATIVE   Resp Syncytial Virus by PCR NEGATIVE NEGATIVE  Urinalysis, Routine w reflex microscopic -Urine, Clean Catch     Status: Abnormal   Collection Time: 12/22/23  9:03 PM  Result Value Ref Range   Color, Urine YELLOW YELLOW   APPearance HAZY (A) CLEAR   Specific Gravity, Urine 1.025 1.005 - 1.030   pH 5.5 5.0 - 8.0   Glucose, UA NEGATIVE NEGATIVE mg/dL   Hgb urine dipstick NEGATIVE NEGATIVE   Bilirubin Urine NEGATIVE NEGATIVE   Ketones, ur NEGATIVE NEGATIVE mg/dL   Protein, ur 30 (A) NEGATIVE mg/dL  Nitrite NEGATIVE NEGATIVE   Leukocytes,Ua TRACE (A) NEGATIVE  Urinalysis, Microscopic (reflex)     Status: Abnormal   Collection Time: 12/22/23  9:03 PM  Result Value Ref Range   RBC / HPF 0-5 0 - 5 RBC/hpf   WBC, UA 0-5 0 - 5 WBC/hpf   Bacteria, UA MANY (A) NONE SEEN   Squamous Epithelial / HPF 11-20 0 - 5 /HPF   Urine-Other MICROSCOPIC EXAM PERFORMED ON UNCONCENTRATED URINE      Brain MRI IMPRESSION: 1. Multifocal acute/early subacute ischemia within the right MCA territory, predominantly affecting the insula and right temporal lobe. 2. Small area of subacute ischemia within the left parietal white matter. 3. Known occlusion of the right MCA M1 segment. 4. Diminished right ICA flow void.   Assessment and Plan: No notes have been filed under this hospital service. Service: Hospitalist     Advance Care Planning:   Code Status: Do not attempt resuscitation (DNR) - Comfort care ***  Consults: Neurology and cardiology  Family Communication: ***  Severity of Illness: The appropriate patient status for this patient is INPATIENT.  Inpatient status is judged to be reasonable and necessary in order to provide the required intensity of service to ensure the patient's safety. The patient's presenting symptoms, physical exam findings, and initial radiographic and laboratory data in the context of their chronic comorbidities is felt to place them at high risk for further clinical deterioration. Furthermore, it is not anticipated that the patient will be medically stable for discharge from the hospital within 2 midnights of admission.   * I certify that at the point of admission it is my clinical judgment that the patient will require inpatient hospital care spanning beyond 2 midnights from the point of admission due to high intensity of service, high risk for further deterioration and high frequency of surveillance required.*  Author: Buena Irish, MD 12/22/2023 11:46 PM  For on call review [email or web address].

## 2023-12-22 NOTE — Consult Note (Signed)
 NEUROLOGY CONSULT NOTE   Date of service: December 22, 2023 Patient Name: Christine Christensen MRN:  [id number] DOB:  04/30/1946 Chief Complaint: "Stroke" Requesting Provider: Rondel Baton, MD  History of Present Illness  Christine Christensen is a 78 y.o. female with hx of dementia, anemia, CAD, prior strokes with residual right-sided deficits, requiring 24/7 help with ADLs-resident of a facility, presented for evaluation of change in mental status and being less responsive than usual.  Initial concern for STEMI.  Evaluated by cardiology.  Not deemed a candidate for intervention. I was called by the ED provider who said that some of the mentation changes were noted this morning but upon more detailed history taken from the family, the son reports that she was already not acting right at around 3 PM yesterday.  She was outside the window for IV thrombolysis and her poor baseline precluded intervention hence a code stroke was not activated Imaging was performed that confirmed stroke and neurology was formally consulted. Patient unable to provide any history at this time  LKW: Prior to 3 PM on 12/21/2023 Modified rankin score: 4-Needs assistance to walk and tend to bodily needs, maybe even progressing to 5 IV Thrombolysis: Outside the window EVT: Outside the window   NIHSS components Score: Comment  1a Level of Conscious 0[]  1[x]  2[]  3[]      1b LOC Questions 0[]  1[]  2[x]       1c LOC Commands 0[]  1[]  2[x]       2 Best Gaze 0[]  1[x]  2[]       3 Visual 0[]  1[]  2[x]  3[]      4 Facial Palsy 0[]  1[x]  2[]  3[]      5a Motor Arm - left 0[]  1[]  2[]  3[x]  4[]  UN[]    5b Motor Arm - Right 0[]  1[]  2[]  3[x]  4[]  UN[]    6a Motor Leg - Left 0[]  1[]  2[]  3[x]  4[]  UN[]    6b Motor Leg - Right 0[]  1[]  2[]  3[x]  4[]  UN[]    7 Limb Ataxia 0[x]  1[]  2[]  3[]  UN[]     8 Sensory 0[x]  1[]  2[]  UN[]      9 Best Language 0[]  1[]  2[]  3[x]      10 Dysarthria 0[]  1[]  2[x]  UN[]      11 Extinct. and Inattention 0[x]  1[]  2[]       TOTAL: 26       ROS  Unable to perform due to her mentation  Past History   Past Medical History:  Diagnosis Date   Anemia    history of anemia   Aortic stenosis    mild aortic stenosis 07/27/17 echo   Arthritis    Heart murmur    Hypertension    Mitral stenosis    moderate mitral stenosis 07/27/17 echo   Stroke [facility])    stroke x2, 2005 and 2008, some right sided weakness   Syncope and collapse 10/25/2017   Urinary incontinence     Past Surgical History:  Procedure Laterality Date   CATARACT EXTRACTION, BILATERAL Bilateral    CHOLECYSTECTOMY     HIP ARTHROPLASTY Right 06/14/2021   Procedure: ARTHROPLASTY BIPOLAR HIP (HEMIARTHROPLASTY);  Surgeon: Joen Laura, MD;  Location: [facility] OR;  Service: Orthopedics;  Laterality: Right;   TONSILLECTOMY     TOTAL KNEE ARTHROPLASTY Right    right knee 2006   TOTAL KNEE ARTHROPLASTY Left 08/06/2017   Procedure: LEFT TOTAL KNEE ARTHROPLASTY;  Surgeon: Dannielle Huh, MD;  Location: [facility] OR;  Service: Orthopedics;  Laterality: Left;   TUBAL LIGATION  Family History: Family History  Problem Relation Age of Onset   Dementia Mother    Heart disease Father    Cerebral palsy Sister     Social History  reports that she has quit smoking. Her smoking use included cigarettes. She has a 28 pack-year smoking history. She has never used smokeless tobacco. She reports that she does not currently use alcohol. She reports that she does not currently use drugs after having used the following drugs: Marijuana.  Allergies  Allergen Reactions   Penicillins Other (See Comments)    Unknown reaction    Medications   Current Facility-Administered Medications:    HYDROmorphone (DILAUDID) injection 0.5 mg, 0.5 mg, Intravenous, Once, Rondel Baton, MD  Current Outpatient Medications:    acetaminophen (TYLENOL) 500 MG tablet, Take 2 tablets (1,000 mg total) by mouth every 6 (six) hours as needed for mild pain, moderate pain, fever or headache., Disp: 30  tablet, Rfl: 0   albuterol (VENTOLIN HFA) 108 (90 Base) MCG/ACT inhaler, Inhale 2 puffs into the lungs every 4 (four) hours as needed for wheezing or shortness of breath., Disp: , Rfl:    aspirin 325 MG tablet, Take 1 tablet (325 mg total) by mouth daily., Disp: 30 tablet, Rfl: 0   busPIRone (BUSPAR) 5 MG tablet, Take 5 mg by mouth 3 (three) times daily., Disp: , Rfl:    divalproex (DEPAKOTE SPRINKLE) 125 MG capsule, Take 125 mg by mouth 2 (two) times daily., Disp: , Rfl:    donepezil (ARICEPT) 5 MG tablet, Take 5 mg by mouth in the morning and at bedtime., Disp: , Rfl:    escitalopram (LEXAPRO) 20 MG tablet, Take 20 mg by mouth every morning., Disp: , Rfl:    LACTOBACILLUS PO, Take 1 capsule by mouth in the morning and at bedtime., Disp: , Rfl:    melatonin 3 MG TABS tablet, Take 1 tablet (3 mg total) by mouth at bedtime., Disp: 30 tablet, Rfl: 0   memantine (NAMENDA) 10 MG tablet, Take 10 mg by mouth 2 (two) times daily., Disp: , Rfl:    Multiple Vitamin (MULTIVITAMIN WITH MINERALS) TABS tablet, Take 1 tablet by mouth daily., Disp: 30 tablet, Rfl: 0   polyethylene glycol (MIRALAX / GLYCOLAX) 17 g packet, Take 17 g by mouth 2 (two) times daily., Disp: 14 each, Rfl: 0   senna-docusate (SENOKOT-S) 8.6-50 MG tablet, Take 1 tablet by mouth 2 (two) times daily., Disp: 30 tablet, Rfl: 0   spironolactone (ALDACTONE) 25 MG tablet, Take 1 tablet (25 mg total) by mouth daily., Disp: , Rfl:    torsemide (DEMADEX) 20 MG tablet, Take 1 tablet (20 mg total) by mouth daily., Disp: , Rfl:   Vitals   Vitals:   12/22/23 2028 12/22/23 2030 12/22/23 2145 12/22/23 2200  BP: 110/66 91/77 116/77 112/89  Pulse: 79 70 (!) 158 78  Resp: (!) 21 16 15 15   Temp: 97.7 F (36.5 C)     TempSrc: Oral     SpO2: 98% 97% 100% 100%    There is no height or weight on file to calculate BMI.  Physical Exam  General: Cachectic elderly looking woman in no acute distress HEENT: Normocephalic atraumatic Chest:  Clear Cardiovascular: Regular rate rhythm Neurological exam Lethargic, opens eyes to voice, does not follow commands, nonverbal. Cranial nerves: Pupils equal round reactive light, gaze is midline but seems like there is a little bit of right gaze preference, no blink to threat from the left, left lower facial weakness.  Motor examination: Increased tone in the right upper extremity with minimal movement to noxious stimulation.  Increased tone in right lower extremity with minimal movement to noxious stimulation.  Normal to flaccid tone in the left upper and lower extremity with minimal movement to noxious stimulation.  No spontaneous movement in any of the 4 extremities Sensory exam: As above Coordination cannot be assessed in her mentation  Labs/Imaging/Neurodiagnostic studies   CBC:  Recent Labs  Lab 01/17/2024 2033 01/17/24 2045  WBC  --  14.2*  NEUTROABS  --  11.1*  HGB 11.9* 11.5*  HCT 35.0* 34.6*  MCV  --  88.5  PLT  --  253   Basic Metabolic Panel:  Lab Results  Component Value Date   NA 139 01-17-2024   K 3.5 01/17/2024   CO2 29 Jan 17, 2024   GLUCOSE 115 (H) 01/17/24   BUN 34 (H) 2024-01-17   CREATININE 1.55 (H) Jan 17, 2024   CALCIUM 8.9 2024/01/17   GFRNONAA 34 (L) 01-17-2024   GFRAA 90 03/03/2021   Lipid Panel: No results found for: "LDLCALC" HgbA1c:  Lab Results  Component Value Date   HGBA1C 5.8 (H) 10/12/2023   Urine Drug Screen:     Component Value Date/Time   LABOPIA NONE DETECTED 10/13/2023 2300   COCAINSCRNUR NONE DETECTED 10/13/2023 2300   LABBENZ NONE DETECTED 10/13/2023 2300   AMPHETMU NONE DETECTED 10/13/2023 2300   THCU NONE DETECTED 10/13/2023 2300   LABBARB NONE DETECTED 10/13/2023 2300    Alcohol Level     Component Value Date/Time   ETH <10 01/17/24 2037   INR  Lab Results  Component Value Date   INR 1.0 10/27/2023   APTT  Lab Results  Component Value Date   APTT 31 10/27/2023   Imaging personally reviewed CT head, CT angio  head and neck: Occluded right M1, old right parietal infarct and findings of chronic small vessel disease.  50% stenosis of proximal left ICA.  MRI brain shows right MCA territory infarction and subacute infarcts in the left hemisphere as well  ASSESSMENT   Greg I Razzano is a 78 y.o. female with above past medical history being brought in for evaluation of decreased level of consciousness and not acting like herself for greater than 24 hours.  Initial concern for STEMI, evaluated by cardiology, not deemed a candidate for intervention. Neurological consultation obtained after the brain imaging reveals M1 occlusion.  MRI brain shows right MCA territory infarct and subacute infarcts in the left hemisphere as well. Outside the window and poor baseline precludes intervention Outside the window for IV TNKase. Patient has had a detailed conversation with her son and has advanced directives.  She wants to be a DNR and also does not want any heroic measures or life prolonging measures in case she has a serious illness. At this time, given her prior strokes with right-sided deficits and now there is right-hemispheric stroke that we will need to left-sided deficits, I have recommended that they consider comfort measures at this time respecting her wishes.  Impression:  Acute ischemic stroke due to acute M1 occlusion, possibly cardioembolic etiology due to the involvement of left cerebral hemisphere with acute strokes as well.  Multiple comorbidities as listed in the HPI  RECOMMENDATIONS  I have recommended pursuing comfort measures to respect patient's wishes at this time as documented above Should that change, and the family wants further workup, stroke risk factor workup to include 2D echocardiogram, A1c, lipid panel, frequent neurochecks and telemetry can be initiated.  Please call neurology in case follow-up is needed. This was discussed in detail with the son at bedside.  Also discussed with Dr.  Jarold Motto, who was at the bedside and witnessed this conversation. ______________________________________________________________________    Dene Gentry, MD [facility]

## 2023-12-22 NOTE — Progress Notes (Signed)
 ED Pharmacy Antibiotic Sign Off An antibiotic consult was received from an ED provider for Merrem per pharmacy dosing for sepsis with hx of ESBL. A chart review was completed to assess appropriateness.   The following one time order(s) were placed:  Merrem 1g IV x 1  Further antibiotic and/or antibiotic pharmacy consults should be ordered by the admitting provider if indicated.   Thank you for allowing pharmacy to be a part of this patient's care.   Daylene Posey, [facility]  Clinical Pharmacist 12/22/23 9:42 PM

## 2023-12-22 NOTE — ED Notes (Signed)
 Pt getting CT and MRI at this time, unable to draw repeat troponin on time. Will draw when pt returns from imaging.

## 2023-12-23 ENCOUNTER — Encounter (HOSPITAL_COMMUNITY): Payer: Self-pay | Admitting: Internal Medicine

## 2023-12-23 ENCOUNTER — Other Ambulatory Visit: Payer: Self-pay

## 2023-12-23 DIAGNOSIS — R64 Cachexia: Secondary | ICD-10-CM | POA: Diagnosis present

## 2023-12-23 DIAGNOSIS — Z96653 Presence of artificial knee joint, bilateral: Secondary | ICD-10-CM | POA: Diagnosis present

## 2023-12-23 DIAGNOSIS — I272 Pulmonary hypertension, unspecified: Secondary | ICD-10-CM | POA: Diagnosis present

## 2023-12-23 DIAGNOSIS — I214 Non-ST elevation (NSTEMI) myocardial infarction: Secondary | ICD-10-CM | POA: Diagnosis present

## 2023-12-23 DIAGNOSIS — I6310 Cerebral infarction due to embolism of unspecified precerebral artery: Secondary | ICD-10-CM | POA: Diagnosis not present

## 2023-12-23 DIAGNOSIS — Z1612 Extended spectrum beta lactamase (ESBL) resistance: Secondary | ICD-10-CM | POA: Diagnosis present

## 2023-12-23 DIAGNOSIS — I639 Cerebral infarction, unspecified: Secondary | ICD-10-CM | POA: Insufficient documentation

## 2023-12-23 DIAGNOSIS — R54 Age-related physical debility: Secondary | ICD-10-CM | POA: Diagnosis present

## 2023-12-23 DIAGNOSIS — Z515 Encounter for palliative care: Secondary | ICD-10-CM | POA: Diagnosis not present

## 2023-12-23 DIAGNOSIS — Z79899 Other long term (current) drug therapy: Secondary | ICD-10-CM | POA: Diagnosis not present

## 2023-12-23 DIAGNOSIS — I5032 Chronic diastolic (congestive) heart failure: Secondary | ICD-10-CM | POA: Diagnosis present

## 2023-12-23 DIAGNOSIS — Z66 Do not resuscitate: Secondary | ICD-10-CM | POA: Diagnosis present

## 2023-12-23 DIAGNOSIS — I110 Hypertensive heart disease with heart failure: Secondary | ICD-10-CM | POA: Diagnosis present

## 2023-12-23 DIAGNOSIS — R402432 Glasgow coma scale score 3-8, at arrival to emergency department: Secondary | ICD-10-CM

## 2023-12-23 DIAGNOSIS — I69351 Hemiplegia and hemiparesis following cerebral infarction affecting right dominant side: Secondary | ICD-10-CM | POA: Diagnosis not present

## 2023-12-23 DIAGNOSIS — F17210 Nicotine dependence, cigarettes, uncomplicated: Secondary | ICD-10-CM | POA: Diagnosis present

## 2023-12-23 DIAGNOSIS — I4891 Unspecified atrial fibrillation: Secondary | ICD-10-CM | POA: Diagnosis present

## 2023-12-23 DIAGNOSIS — R0682 Tachypnea, not elsewhere classified: Secondary | ICD-10-CM | POA: Diagnosis present

## 2023-12-23 DIAGNOSIS — Z7189 Other specified counseling: Secondary | ICD-10-CM | POA: Diagnosis not present

## 2023-12-23 DIAGNOSIS — F0150 Vascular dementia without behavioral disturbance: Secondary | ICD-10-CM | POA: Diagnosis present

## 2023-12-23 DIAGNOSIS — Z1152 Encounter for screening for COVID-19: Secondary | ICD-10-CM | POA: Diagnosis not present

## 2023-12-23 DIAGNOSIS — I080 Rheumatic disorders of both mitral and aortic valves: Secondary | ICD-10-CM | POA: Diagnosis present

## 2023-12-23 DIAGNOSIS — I2510 Atherosclerotic heart disease of native coronary artery without angina pectoris: Secondary | ICD-10-CM | POA: Diagnosis present

## 2023-12-23 DIAGNOSIS — Z7982 Long term (current) use of aspirin: Secondary | ICD-10-CM | POA: Diagnosis not present

## 2023-12-23 DIAGNOSIS — Z789 Other specified health status: Secondary | ICD-10-CM

## 2023-12-23 DIAGNOSIS — I63513 Cerebral infarction due to unspecified occlusion or stenosis of bilateral middle cerebral arteries: Secondary | ICD-10-CM | POA: Diagnosis present

## 2023-12-23 DIAGNOSIS — R000 Tachycardia, unspecified: Secondary | ICD-10-CM | POA: Diagnosis present

## 2023-12-23 DIAGNOSIS — Z993 Dependence on wheelchair: Secondary | ICD-10-CM | POA: Diagnosis not present

## 2023-12-23 DIAGNOSIS — Z880 Allergy status to penicillin: Secondary | ICD-10-CM | POA: Diagnosis not present

## 2023-12-23 MED ORDER — LORAZEPAM 2 MG/ML PO CONC: 1.0000 mg | ORAL | Status: DC | PRN

## 2023-12-23 MED ORDER — GLYCOPYRROLATE 0.2 MG/ML IJ SOLN: 0.2000 mg | INTRAMUSCULAR | Status: DC | PRN

## 2023-12-23 MED ORDER — HALOPERIDOL 0.5 MG PO TABS
0.5000 mg | ORAL_TABLET | ORAL | Status: DC | PRN
  Filled 2023-12-23: qty 1

## 2023-12-23 MED ORDER — POLYVINYL ALCOHOL 1.4 % OP SOLN
1.0000 [drp] | Freq: Four times a day (QID) | OPHTHALMIC | Status: DC | PRN
  Filled 2023-12-23: qty 15

## 2023-12-23 MED ORDER — HALOPERIDOL LACTATE 2 MG/ML PO CONC
0.5000 mg | ORAL | Status: DC | PRN
  Filled 2023-12-23: qty 5

## 2023-12-23 MED ORDER — ORAL CARE MOUTH RINSE
15.0000 mL | OROMUCOSAL | Status: DC
  Administered 2023-12-23 – 2023-12-26 (×9): 15 mL via OROMUCOSAL

## 2023-12-23 MED ORDER — LORAZEPAM 1 MG PO TABS: 1.0000 mg | ORAL_TABLET | ORAL | Status: DC | PRN

## 2023-12-23 MED ORDER — HALOPERIDOL LACTATE 5 MG/ML IJ SOLN: 0.5000 mg | INTRAMUSCULAR | Status: DC | PRN

## 2023-12-23 MED ORDER — BIOTENE DRY MOUTH MT LIQD: 15.0000 mL | OROMUCOSAL | Status: DC | PRN

## 2023-12-23 MED ORDER — ORAL CARE MOUTH RINSE: 15.0000 mL | OROMUCOSAL | Status: DC | PRN

## 2023-12-23 MED ORDER — GLYCOPYRROLATE 1 MG PO TABS
1.0000 mg | ORAL_TABLET | ORAL | Status: DC | PRN
  Filled 2023-12-23: qty 1

## 2023-12-23 MED ORDER — ONDANSETRON HCL 4 MG/2ML IJ SOLN: 4.0000 mg | Freq: Four times a day (QID) | INTRAMUSCULAR | Status: DC | PRN

## 2023-12-23 MED ORDER — MORPHINE SULFATE (PF) 2 MG/ML IV SOLN
1.0000 mg | INTRAVENOUS | Status: DC | PRN
  Administered 2023-12-24 – 2023-12-25 (×4): 2 mg via INTRAVENOUS
  Administered 2023-12-26: 1 mg via INTRAVENOUS
  Administered 2023-12-26: 2 mg via INTRAVENOUS
  Filled 2023-12-23 (×6): qty 1

## 2023-12-23 MED ORDER — ACETAMINOPHEN 325 MG PO TABS: 650.0000 mg | ORAL_TABLET | Freq: Four times a day (QID) | ORAL | Status: DC | PRN

## 2023-12-23 MED ORDER — ACETAMINOPHEN 650 MG RE SUPP: 650.0000 mg | Freq: Four times a day (QID) | RECTAL | Status: DC | PRN

## 2023-12-23 MED ORDER — LORAZEPAM 2 MG/ML IJ SOLN: 1.0000 mg | INTRAMUSCULAR | Status: DC | PRN

## 2023-12-23 MED ORDER — ONDANSETRON HCL 4 MG PO TABS: 4.0000 mg | ORAL_TABLET | Freq: Four times a day (QID) | ORAL | Status: DC | PRN

## 2023-12-23 NOTE — Progress Notes (Signed)
  Progress Note   Patient: Christine Christensen WUJ:[id number] DOB: 11-Dec-1945 DOA: 12/22/2023     0 DOS: the patient was seen and examined on 12/23/2023 at 11:21AM      Brief hospital course: 78 y.o. F with advanced dementia, CAD, prior stroke with right hemiparesis, living in facility who presented with decreased mentation.  In the ER, troponins elevated.  MRI obtained and found large RIGHT MCA territory infarct.       Assessment and Plan: Principal Problem:   CVA (cerebral vascular accident) (HCC) Active Problems: NSTEMI ruled out Elevated troponin without ischemia or injury due to stroke Vascular dementia Chronic diastolic congestive heart failure Wheelchair-bound Hypertension History ESBL UTI Pulmonary hypertension Mitral valve stenosis  The patient was evaluated and found to have large right MCA stroke.   Given her poor functional status at baseline, compounded by this new large stroke, and her poor mental status, hospice was recommended.  This was discussed with family and patient's DSS representative, who agreed.  - Full comfort care        Subjective: The patient is unresponsive.  Nursing have no concerns.     Physical Exam: BP 95/69 (BP Location: Right Arm)   Pulse 76   Temp 97.9 F (36.6 C) (Oral)   Resp 20   SpO2 98%   Frail elderly female, lying in bed, no opens eyes but does not move to command RRR, no murmurs, no peripheral edema Respiratory rate normal, lungs clear without rales or wheezes Abdomen without grimace to palpation She is staring to the left, her eyes are open, but she does not follow commands, or acknowledge her presence.  She does not resist movement of the upper extremities, and cannot hold her arms up against gravity        Disposition: Status is: Inpatient The patient was admitted with large stroke.  This is terminal, and neurology, the hospitalist service, and palliative care have recommended comfort measures and hospice.  We  will observe over the next 24 hours in the hospital, and potentially transferred to [facility] in the coming days        Author: Alberteen Sam, MD 12/23/2023 7:45 PM  For on call review [email or web address].

## 2023-12-23 NOTE — Plan of Care (Signed)
  Problem: Pain Management: Goal: Satisfaction with pain management regimen will improve Outcome: Progressing   Problem: Coping: Goal: Level of anxiety will decrease Outcome: Progressing   Problem: Safety: Goal: Ability to remain free from injury will improve Outcome: Progressing   Problem: Skin Integrity: Goal: Risk for impaired skin integrity will decrease Outcome: Progressing

## 2023-12-23 NOTE — ED Notes (Signed)
 Pt changed and repositioned at this time. Clean sheets and blankets provided. Lights dimmed for comfort. Son at bedside.

## 2023-12-23 NOTE — Consult Note (Signed)
 Palliative Care Consult Note                                  Date: 12/23/2023   Patient Name: Christine Christensen  DOB: [birth year]  MRN: [id number]  Age / Sex: 78 y.o., female  PCP: Fleet Contras, MD Referring Physician: Alberteen Sam, *  Reason for Consultation: Non pain symptom management, Pain control, and Terminal Care  HPI/Patient Profile: 78 y.o. female  with past medical history of  vascular dementia, chronic diastolic CHF, and prior CVA who presented from the nursing home for decreased level of consciousness. She was admitted on 12/22/2023 with NSTEMI (not candidate for intervention), CVA and transitioned to comfort care by neurology and hospitalist team after discussion with family and based on her wishes for not artificial prolonging of life or heroic measures.  PMT was consulted for comfort care.   Past Medical History:  Diagnosis Date   Anemia    history of anemia   Aortic stenosis    mild aortic stenosis 07/27/17 echo   Arthritis    Heart murmur    Hypertension    Mitral stenosis    moderate mitral stenosis 07/27/17 echo   Stroke [facility])    stroke x2, 2005 and 2008, some right sided weakness   Syncope and collapse 10/25/2017   Urinary incontinence     Subjective:   This NP Wynne Dust reviewed medical records, received report from team, assessed the patient and then meet at the patient's bedside to discuss diagnosis, prognosis, GOC, EOL wishes disposition and options.  I met with the patient at bedside, no family was present.   We meet to discuss diagnosis prognosis, GOC, EOL wishes, disposition and options. Concept of Palliative Care was introduced as specialized medical care for people and their families living with serious illness.  If focuses on providing relief from the symptoms and stress of a serious illness.  The goal is to improve quality of life for both the patient and the family. Values and goals of care  important to patient and family were attempted to be elicited.  Created space and opportunity for patient  and family to explore thoughts and feelings regarding current medical situation   Natural trajectory and current clinical status were discussed. Questions and concerns addressed. Patient  encouraged to call with questions or concerns.    Patient/Family Understanding of Illness: Deferred  Life Review: Deferred  Patient Values: Deferred  Goals: Deferred  Today's Discussion: Prior to seeing the patient I reviewed the chart and previous notes. The patient's vitals are stable, with BP occasionally soft (most recently 9569). Received one time dose of dilaudid 0.5 mg about midnight (11 hours ago) and the same with one time order for Robinul 0.2 mg.   When I entered the room the patient appeared comfortable, no obvious signs of distress or discomfort.  Respirations were even and unlabored.  The patient was sleeping and I elected to not wake her.  After seeing the patient I come through both electronic and paper chart.  It appears palliative medicine saw the patient in March and my colleague Harvest Dark, NP spoke with DSS guardian who noted that the patient was a DNR to facility and gave the approval for DNR at [facility].  Subsequently a [facility] DNR form was completed and remains on the chart.  However, there is a MOST form from the patient's facility that indicated  a full code and full scope of care./Forward to yesterday when the patient was brought in by EMS at approximately 8 PM.  The patient was unresponsive and quite ill.  The patient's son was present and informed the medical staff that he was HCPOA at which point a conversation about her current status was had and she was appropriately made DNR/comfort care.  However, reviewing previous notes from March appear that the patient's guardian may be actually DSS.  Given this I spoke with the patient's nurse to update her about  situation as well as the hospitalist Dr. Maryfrances Bunnell.  I spent a substantial mount of time calling around to the patient's assigned DSS guardian Ranelle Oyster who left contact information for immediate assistance with Juanita Laster at [phone].  That went to voicemail and the message was left at approximately 12:45 PM.  There is also a number for the call center at [phone].  I called that number at 12:52 PM and left a message for call back with the on-call social worker, speaking with Loistine Chance who indicated he would page the on-call.  I then updated Dr. Maryfrances Bunnell and we will continue to wait to hear back from DSS.  In the meantime I have not made any changes to her care.  I received a call back sometime later from on-call social worker.  I explained the situation in detail.  He requested time to speak with the on-call department leadership.  About 30 minutes later he called back again requesting more information.  He stated that the on-call leadership/program manager would call back.  Sometime after that I got a call back from the DSS program manager.  We spent extensive time talk about situation, explained that family had expressed desire for DNR/comfort care.  I was informed that in this situation they would include the family's wishes as well as professional recommendations (which expressed our DNR/comfort care) and decision making.  Given the family's expressed desire for DNR and comfort care, DSS will not objected this.  They have requested for courtesy notification when the patient does pass.  After this I called and spoke with Dr. Maryfrances Bunnell to give him an update so that we are on the same page to proceed with DNR/comfort care.  I also updated nursing staff and indicated (basic comfort care as needed orders for any symptom management needs any arise.  Review of Systems  Unable to perform ROS   Objective:   Primary Diagnoses: Present on Admission:  NSTEMI (non-ST elevated myocardial infarction)  [facility])   Physical Exam Vitals and nursing note reviewed.  Constitutional:      General: She is sleeping. She is not in acute distress. Pulmonary:     Effort: Pulmonary effort is normal. No respiratory distress.     Vital Signs:  BP 95/69 (BP Location: Right Arm)   Pulse 76   Temp 97.9 F (36.6 C) (Oral)   Resp 20   SpO2 98%   Palliative Assessment/Data: 10%    Advanced Care Planning:   Existing Vynca/ACP Documentation: None  Primary Decision Maker: OTHER: Unsure, see notes above  Code Status/Advance Care Planning: DNR-comfort  A discussion was had today regarding advanced directives. Concepts specific to code status, artifical feeding and hydration, continued IV antibiotics and rehospitalization was had.  The difference between a aggressive medical intervention path and a palliative comfort care path for this patient at this time was had.   Decisions/Changes to ACP: None today  Assessment & Plan:   Impression: 78 year old  female with acute presentation chronic comorbidities as described above.  There is a bit of murkiness regarding her legal decision maker, this time she is unable to participate in conversations.  Decision makers either son (who indicated to the emergency room staff yesterday that he is HCPOA and subsequently made the patient DNR/comfort care) versus DSS, with multiple calls out to be assessed to try to ascertain the situation.  At this time the patient appears comfortable, no distress, no needed comfort orders.  DSS has responded to phone calls and have indicated they will not inject to DNR/comfort care as recommended by the medical team and agreed to by family.  Palliative medicine continue to follow for symptom management/comfort care.  We can consider possible hospice evaluation in 24-48 hours.  SUMMARY OF RECOMMENDATIONS   DNR-comfort Continue comfort care See symptom management orders below Palliative medicine continue to follow Consider  evaluation for inpatient hospice if she remains stable over the next 24 to 48 hours  Symptom Management:  Zofran 4 mg IV every 6 hours as needed nausea Tylenol 650 mg PR every 6 hours as needed mild pain or fever greater than 111 Robinul 0.2 mg IV every 4 hours as needed cystoscopy secretions Haldol 0.5 mg IV every 4 hours as needed agitation or delirium Ativan 1 mg IV every 4 hours as needed anxiety Morphine 1 to 4 mg IV every 15 minutes as needed severe pain or signs/symptoms of distress Polyvinyl alcohol 1.4% ophthalmic 1 drop OU 4 times daily as needed dry eyes  Prognosis:  Unable to determine  Discharge Planning:  To Be Determined   Discussed with: Medical team, nursing team, DSS    Thank you for allowing [place] to participate in the care of Jonella I Shuping PMT will continue to support holistically.  Time Total: 106 min  Detailed review of medical records (labs, imaging, vital signs), medically appropriate exam, discussed with treatment team, counseling and education to patient, family, & staff, documenting clinical information, medication management, coordination of care  Signed by: Wynne Dust, NP Palliative Medicine Team  Team Phone # [phone] (Nights/Weekends)  12/23/2023, 10:55 AM

## 2023-12-23 NOTE — Progress Notes (Signed)
   Overnight consult note reviewed - agree with recommendations. Troponin did come back significantly elevated at 1503. EKG personally reviewed showed afib with RVR and diffuse ST elevation. Prior troponin in February was normal and LVEF at that time was >75%. She has also suffered acute stroke. Given her advanced dementia, neurology has recommended comfort care.   This is reasonable given her multiple co-morbidities and poor chance of meaningful recovery.  Cardiology will sign-off.  [facility] will sign off.   Medication Recommendations:  as above Other recommendations (labs, testing, etc):  none Follow up as an outpatient:  none  Chrystie Nose, MD, [facility], FACP  [place]  [facility]  Medical Director of the Advanced Lipid Disorders &  Cardiovascular Risk Reduction Clinic Diplomate of the American Board of Clinical Lipidology Attending Cardiologist  Direct Dial: [phone]  Fax: [phone]  Website:  [email or web address]

## 2023-12-24 DIAGNOSIS — Z7189 Other specified counseling: Secondary | ICD-10-CM

## 2023-12-24 DIAGNOSIS — Z66 Do not resuscitate: Secondary | ICD-10-CM | POA: Diagnosis not present

## 2023-12-24 DIAGNOSIS — Z515 Encounter for palliative care: Secondary | ICD-10-CM | POA: Diagnosis not present

## 2023-12-24 DIAGNOSIS — I6310 Cerebral infarction due to embolism of unspecified precerebral artery: Secondary | ICD-10-CM | POA: Diagnosis not present

## 2023-12-24 NOTE — Plan of Care (Signed)
  Problem: Coping: Goal: Ability to identify and develop effective coping behavior will improve Outcome: Progressing   Problem: Role Relationship: Goal: Family's ability to cope with current situation will improve Outcome: Progressing   Problem: Pain Management: Goal: Satisfaction with pain management regimen will improve Outcome: Progressing   Problem: Nutrition: Goal: Adequate nutrition will be maintained Outcome: Progressing   Problem: Safety: Goal: Ability to remain free from injury will improve Outcome: Progressing   Problem: Skin Integrity: Goal: Risk for impaired skin integrity will decrease Outcome: Progressing

## 2023-12-24 NOTE — Progress Notes (Signed)
  Progress Note   Patient: Christine Christensen AVW:[id number] DOB: Jan 31, 1946 DOA: 12/22/2023     1 DOS: the patient was seen and examined on 12/24/2023 at 11:21AM      Brief hospital course: 78 y.o. F with advanced dementia, CAD, prior stroke with right hemiparesis, living in facility who presented with decreased mentation.  In the ER, troponins elevated.  MRI obtained and found large RIGHT MCA territory infarct.       Assessment and Plan: Principal Problem:   CVA (cerebral vascular accident) (HCC) Active Problems: NSTEMI ruled out Elevated troponin without ischemia or injury due to stroke Vascular dementia Chronic diastolic congestive heart failure Wheelchair-bound Hypertension History ESBL UTI Pulmonary hypertension Mitral valve stenosis  The patient was evaluated and found to have large right MCA stroke.   Given her poor functional status at baseline, compounded by this new large stroke, and her poor mental status, hospice was recommended.  This was discussed with family and patient's DSS representative, who agreed.  - Full comfort care        Subjective: No change in patient status.  No nursing concerns     Physical Exam: BP 126/87 (BP Location: Right Arm)   Pulse (!) 102   Temp 98.8 F (37.1 C) (Oral)   Resp 18   SpO2 96%   Frail elderly female, lying in bed, does not open eyes RRR, no murmurs, no peripheral edema No responses, does not follow commands    Family discussion: Son at the bedside   Disposition: Status is: Inpatient The patient was admitted with large stroke.  This is terminal, and neurology, the hospitalist service, and palliative care have recommended comfort measures and hospice.  The patient is terminal, and we will defer to palliative care for the best location of care        Author: Alberteen Sam, MD 12/24/2023 7:46 PM  For on call review [email or web address].

## 2023-12-24 NOTE — Progress Notes (Signed)
 Daily Progress Note   Patient Name: Christine Christensen       Date: 12/24/2023 DOB: 1946-07-26  Age: 78 y.o. MRN#: [id number] Attending Physician: Christine Christensen, * Primary Care Physician: Christine Contras, MD Admit Date: 12/22/2023 Length of Stay: 1 day  Reason for Consultation/Follow-up: Non pain symptom management, Pain control, and Terminal Care  HPI/Patient Profile:  78 y.o. female  with past medical history of  vascular dementia, chronic diastolic CHF, and prior CVA who presented from the nursing home for decreased level of consciousness. She was admitted on 12/22/2023 with NSTEMI (not candidate for intervention), CVA and transitioned to comfort care by neurology and hospitalist team after discussion with family and based on her wishes for not artificial prolonging of life or heroic measures.   PMT was consulted for comfort care.   Subjective:   Subjective: Chart Reviewed. Updates received. Patient Assessed. Created space and opportunity for patient  and family to explore thoughts and feelings regarding current medical situation.  Today's Discussion: Today saw the patient at the bedside, her son Christine Christensen was present.  We spent a significant amount of time reviewing the clinical events that led to her hospitalization.  We shared the decision for DNR/comfort care.  Christine Christensen asked "are they sure that there is nothing they can do to help her or fix this?"  I affirmed that cardiology neurology both that she was not a candidate for intervention.  Unfortunately, while we could extend her life with artificial means such as tube feeding, her life would substantially not be much different than it is right now where she is unresponsive and not interactive.  Brain is clear this point that she would not want that.  I shared that while we cannot choose the timing of her end-of-life we do have control over how it proceeds and expressed my agreement with his decision for DNR and comfort care to allow her  comfort, peace, dignity as her end-of-life approaches.  He is understandably quite tearful during our conversation.  We discussed that thus far she looked comfortable.  She has not required a substantial amount of medication.  Her vitals are currently stable.  We discussed the option to remain in the hospital for comfort care versus transitioning to hospice at the hospice facility. I described hospice as a service for patients who have a life expectancy of 6 months or less. The goal of hospice is the preservation of dignity and quality at the end phases of life. Under hospice care, the focus changes from curative to symptom relief. I explained the three setting where hospice services can be provided including the home, at a living facility (such as LTC SNF, Assisted Living, etc), and a hospice facility. I explained that acceptance to hospice in any specific location is the final decision of the hospice medical director and bed availability, if applicable.  He verbalized understanding.  Christine Christensen states he would like to speak with his brother first.  I also expressed that I would update the legal guardian with DSS.  Christine Christensen states that he would call with their decision and I provided a contact card with contact information.  Finally Christine Christensen spent some time talking about his mother's faith.  I offered chaplaincy support for prayer for the patient as well as support for Christine Christensen as she is struggling with his mother's mortality.  He excepted my offer.  I shared that palliative medicine would continue to follow daily while the patient remains admitted on comfort care.I provided emotional and general  support through therapeutic listening, empathy, sharing of stories, and other techniques. I answered all questions and addressed all concerns to the best of my ability.  After the visit I called and spoke with the patient's legal guardian Christine Christensen with DSS.  I explained the situation, the conversations over the  weekend with the on-call program director, and recommendation for consideration of hospice evaluation.  She verbalized agreement with DNR and comfort care and expressed support to have hospice evaluate him for inpatient placement if family is agreeable.  Later in today she called back after speaking with the patient's son and was inquiring about the differences between going to "facility" versus being here.  I again explained that comfort care would be the objective at either place but inpatient hospice is oftentimes more quiet and accommodating to multiple visitors.  She stated she would call and speak with Christine Christensen again encouraged him to make a decision as soon as possible to let us know so we can work on evaluation if they desire.  Review of Systems  Unable to perform ROS: Patient unresponsive    Objective:   Vital Signs:  BP 126/87 (BP Location: Right Arm)   Pulse (!) 102   Temp 98.8 F (37.1 C) (Oral)   Resp 18   SpO2 96%   Physical Exam Vitals and nursing note reviewed.  Constitutional:      General: She is sleeping. She is not in acute distress. HENT:     Head: Normocephalic and atraumatic.  Cardiovascular:     Rate and Rhythm: Tachycardia present.  Pulmonary:     Effort: Pulmonary effort is normal. No respiratory distress.  Abdominal:     General: Abdomen is flat.     Palpations: Abdomen is soft.  Skin:    General: Skin is warm and dry.  Neurological:     Mental Status: She is unresponsive.     Palliative Assessment/Data: 10%    Existing Vynca/ACP Documentation: None  Assessment & Plan:   Impression: Present on Admission:  NSTEMI (non-ST elevated myocardial infarction) [facility])  78 year old female with acute presentation chronic comorbidities as described above.  There is a bit of murkiness regarding her legal decision maker, this time she is unable to participate in conversations.  Decision makers either son (who indicated to the emergency room staff yesterday  that he is HCPOA and subsequently made the patient DNR/comfort care) versus DSS, with multiple calls out to be assessed to try to ascertain the situation.  At this time the patient appears comfortable, no distress, no needed comfort orders.  DSS has responded to phone calls and have indicated they will not inject to DNR/comfort care as recommended by the medical team and agreed to by family.  Son expressed agreement with DNR/comfort care and is considering requesting inpatient hospice evaluation.  Palliative medicine continue to follow for symptom management/comfort care.   SUMMARY OF RECOMMENDATIONS   DNR-comfort Continue comfort care See symptom management orders below Will await family decision on possible inpatient hospice evaluation Palliative medicine will follow daily for symptom management/comfort care  Symptom Management:  Zofran 4 mg IV every 6 hours as needed nausea Tylenol 650 mg PR every 6 hours as needed mild pain or fever greater than 111 Robinul 0.2 mg IV every 4 hours as needed cystoscopy secretions Haldol 0.5 mg IV every 4 hours as needed agitation or delirium Ativan 1 mg IV every 4 hours as needed anxiety Morphine 1 to 4 mg IV every 15 minutes as needed severe  pain or signs/symptoms of distress Polyvinyl alcohol 1.4% ophthalmic 1 drop OU 4 times daily as needed dry eyes  Code Status: DNR-comfort  Prognosis: < 2 weeks  Discharge Planning: To Be Determined  Discussed with: Patient, medical team, nursing team, [facility] team, DSS legal guardian  Thank you for allowing [place] to participate in the care of Neema I Demeyer PMT will continue to support holistically.  Time Total: 68 min  Detailed review of medical records (labs, imaging, vital signs), medically appropriate exam, discussed with treatment team, counseling and education to patient, family, & staff, documenting clinical information, medication management, coordination of care  Wynne Dust, NP Palliative Medicine  Team  Team Phone # [phone] (Nights/Weekends)  05/17/2021, 8:17 AM

## 2023-12-24 NOTE — Progress Notes (Signed)
 This chaplain responded to PMT NP-Eric consult for Pt. prayer and spiritual support for the family. The Pt. son-Brandon is at the bedside. The Pt. is resting comfortably. The Pt. sister arrived as the chaplain stepped out of the room.  The chaplain listened reflectively as Apolinar Junes talked about the Pt. strong religious beliefs and how the Pt. guided him and loved him as a mother. Apolinar Junes shared the anticipatory grief of losing his mother after multiple other family deaths has left him with questions and emotions he can't answer.    The chaplain affirmed Brandon's presence at the Pt. bedside and his relationship with his mother. Both Brandon and the chaplain affirmed God's love and the Pt. wisdom "keeping praying and reading the scripture" in the space of the unknown.  This chaplain is available for F/U spiritual care as needed. Intercessory prayer was accepted by Apolinar Junes.  Chaplain Stephanie Acre [phone]

## 2023-12-25 DIAGNOSIS — Z789 Other specified health status: Secondary | ICD-10-CM

## 2023-12-25 DIAGNOSIS — Z515 Encounter for palliative care: Secondary | ICD-10-CM | POA: Diagnosis not present

## 2023-12-25 DIAGNOSIS — Z66 Do not resuscitate: Secondary | ICD-10-CM | POA: Diagnosis not present

## 2023-12-25 DIAGNOSIS — Z7189 Other specified counseling: Secondary | ICD-10-CM | POA: Diagnosis not present

## 2023-12-25 DIAGNOSIS — I6310 Cerebral infarction due to embolism of unspecified precerebral artery: Secondary | ICD-10-CM | POA: Diagnosis not present

## 2023-12-25 MED ORDER — MORPHINE SULFATE (CONCENTRATE) 10 MG /0.5 ML PO SOLN: 10.0000 mg | ORAL | Status: DC | PRN

## 2023-12-25 NOTE — Plan of Care (Signed)
  Problem: Education: Goal: Knowledge of the prescribed therapeutic regimen will improve Outcome: Not Progressing   Problem: Coping: Goal: Ability to identify and develop effective coping behavior will improve Outcome: Not Progressing   Problem: Clinical Measurements: Goal: Quality of life will improve Outcome: Not Progressing   Problem: Pain Management: Goal: Satisfaction with pain management regimen will improve Outcome: Not Progressing

## 2023-12-25 NOTE — Progress Notes (Signed)
  Progress Note   Patient: Christine Christensen ZOX:[id number] DOB: November 22, 1945 DOA: 12/22/2023     2 DOS: the patient was seen and examined on 12/25/2023 at 11:44AM      Brief hospital course: 78 y.o. F with advanced dementia, CAD, prior stroke with right hemiparesis, living in facility who presented with decreased mentation.  In the ER, troponins elevated.  MRI obtained and found large RIGHT MCA territory infarct.       Assessment and Plan: Principal Problem:   CVA (cerebral vascular accident) (HCC) Active Problems: NSTEMI ruled out Elevated troponin without ischemia or injury due to stroke Vascular dementia Chronic diastolic congestive heart failure Wheelchair-bound Hypertension History ESBL UTI Pulmonary hypertension Mitral valve stenosis  The patient was evaluated and found to have large right MCA stroke.   Given her poor functional status at baseline, compounded by this new large stroke, and her poor mental status, hospice was recommended.  This was discussed with family and patient's DSS representative, who agreed.  - Full comfort care        Subjective: No change in patient status.  No nursing concerns     Physical Exam: BP (!) 123/98 (BP Location: Right Arm)   Pulse (!) 102   Temp 98.9 F (37.2 C)   Resp 17   SpO2 98%   Frail elderly female, lying in bed, does not open eyes Tachycardic, irregular, no murmurs, no peripheral edema No responses, does not follow commands    Family discussion: Spoke with son yesterday   Disposition: Status is: Inpatient The patient was admitted with large stroke.  This is terminal, and neurology, the hospitalist service, and palliative care have recommended comfort measures and hospice.  We will defer to palliative care for the best location of care        Author: Alberteen Sam, MD 12/25/2023 4:07 PM  For on call review [email or web address].

## 2023-12-25 NOTE — Progress Notes (Signed)
 Daily Progress Note   Patient Name: Christine Christensen       Date: 12/25/2023 DOB: 1946/03/04  Age: 78 y.o. MRN#: [id number] Attending Physician: Alberteen Sam, * Primary Care Physician: Fleet Contras, MD Admit Date: 12/22/2023 Length of Stay: 2 days  Reason for Consultation/Follow-up: Non pain symptom management, Pain control, and Terminal Care  HPI/Patient Profile:  78 y.o. female  with past medical history of  vascular dementia, chronic diastolic CHF, and prior CVA who presented from the nursing home for decreased level of consciousness. She was admitted on 12/22/2023 with NSTEMI (not candidate for intervention), CVA and transitioned to comfort care by neurology and hospitalist team after discussion with family and based on her wishes for not artificial prolonging of life or heroic measures.   PMT was consulted for comfort care.   Subjective:   Subjective: Chart Reviewed. Updates received. Patient Assessed. Created space and opportunity for patient  and family to explore thoughts and feelings regarding current medical situation.  Today's Discussion: Today saw the patient at the bedside, no family is present.  Before seeing the patient I reviewed the chart.  Vital signs are stable.  She received 2 mg of morphine at the 630 last night and again around 1130 last night.  No morphine since then noted in the chart.  When I saw the patient at the bedside she was randomly moving her arm and seem to be grabbing at things.  She was grimacing, tachycardic at 144 and irregular, tachypneic with a respiratory rate of 34 and some noted apneic pauses.  Called the nurse and requested that she be given pain medication for her apparent distress, which was promptly done.  Called the patient's son and discussed option for inpatient hospice.  He said he did speak with his brother last night and his brother's concern is he wants her cousin, who is in the medical field, to come see the patient and weigh in on  whether hospice is right at this time.  He also states that he is not sure if her PCP has seen her and will try to reach out for their opinion as well.  The patient's son Apolinar Junes said that he is not sure why his brother is asking for the things that as it was quite clear to him yesterday there is nothing left to do, however he does want to allow his brother this time and opportunity.  I shared that I would plan to see the patient tomorrow morning and Apolinar Junes shares that he will be there with his cousin at that time.  I reaffirmed that she remains comfort care in the hospital and is receiving comfort medications for prevention of distress.  He verbalized understanding.  Later today received a message from Dr. Maryfrances Bunnell and after discussion added Roxanol as a Merry medication and left IV morphine as a backup for significant distress.  I provided emotional and general support through therapeutic listening, empathy, sharing of stories, and other techniques. I answered all questions and addressed all concerns to the best of my ability.  Review of Systems  Unable to perform ROS: Patient unresponsive    Objective:   Vital Signs:  BP (!) 123/98 (BP Location: Right Arm)   Pulse (!) 102   Temp 98.9 F (37.2 C)   Resp 17   SpO2 98%   Physical Exam Vitals and nursing note reviewed.  Constitutional:      General: She is sleeping. She is not in acute distress.  Comments: Noted grimacing  HENT:     Head: Normocephalic and atraumatic.  Cardiovascular:     Rate and Rhythm: Tachycardia present.  Pulmonary:     Effort: Tachypnea (RR 34 breaths/min) and respiratory distress present.     Comments: Occasional apneic pauses, increased work of breathing Abdominal:     General: Abdomen is flat.     Palpations: Abdomen is soft.  Skin:    General: Skin is warm and dry.  Neurological:     Mental Status: She is unresponsive.     Palliative Assessment/Data: 10%    Existing Vynca/ACP  Documentation: None  Assessment & Plan:   Impression: Present on Admission:  NSTEMI (non-ST elevated myocardial infarction) [facility])  78 year old female with acute presentation chronic comorbidities as described above.  There is a bit of murkiness regarding her legal decision maker, this time she is unable to participate in conversations.  Decision makers either son (who indicated to the emergency room staff yesterday that he is HCPOA and subsequently made the patient DNR/comfort care) versus DSS, with multiple calls out to be assessed to try to ascertain the situation.  At this time the patient appears comfortable, no distress, no needed comfort orders.  DSS has responded to phone calls and have indicated they will not inject to DNR/comfort care as recommended by the medical team and agreed to by family.  Son expressed agreement with DNR/comfort care and is considering requesting inpatient hospice evaluation.  Plan for son to visit tomorrow with his cousin.  Palliative medicine continue to follow for symptom management/comfort care.   SUMMARY OF RECOMMENDATIONS   DNR-comfort Continue comfort care See symptom management orders below Will await family decision on possible inpatient hospice evaluation Palliative medicine will follow daily for symptom management/comfort care  Symptom Management:  Zofran 4 mg IV every 6 hours as needed nausea Tylenol 650 mg PR every 6 hours as needed mild pain or fever greater than 111 Robinul 0.2 mg IV every 4 hours as needed cystoscopy secretions Haldol 0.5 mg IV every 4 hours as needed agitation or delirium Ativan 1 mg IV every 4 hours as needed anxiety ADDED Roxanol 10 mg sublingual every 2 hours as needed pain or distress (primary option) Morphine 1 to 4 mg IV every 15 minutes as needed severe pain or signs/symptoms of distress (secondary option for severe symptoms) Polyvinyl alcohol 1.4% ophthalmic 1 drop OU 4 times daily as needed dry eyes  Code Status:  DNR-comfort  Prognosis: < 2 weeks  Discharge Planning: To Be Determined  Discussed with: Family, medical team, nursing team, [facility] team  Thank you for allowing [place] to participate in the care of Damonie I Josten PMT will continue to support holistically.  Time Total: 58 min  Detailed review of medical records (labs, imaging, vital signs), medically appropriate exam, discussed with treatment team, counseling and education to patient, family, & staff, documenting clinical information, medication management, coordination of care  Wynne Dust, NP Palliative Medicine Team  Team Phone # [phone] (Nights/Weekends)  05/17/2021, 8:17 AM

## 2023-12-25 NOTE — Progress Notes (Signed)
   12/25/23 1213  Spiritual Encounters  Type of Visit Initial  Referral source Family  Reason for visit End-of-life  OnCall Visit No   Visited with patient as per family request. Provided spiritual care via compassionate presence and prayer.

## 2023-12-26 DIAGNOSIS — Z789 Other specified health status: Secondary | ICD-10-CM

## 2023-12-26 DIAGNOSIS — Z515 Encounter for palliative care: Secondary | ICD-10-CM | POA: Diagnosis not present

## 2023-12-26 DIAGNOSIS — Z66 Do not resuscitate: Secondary | ICD-10-CM | POA: Diagnosis not present

## 2023-12-26 DIAGNOSIS — I6310 Cerebral infarction due to embolism of unspecified precerebral artery: Secondary | ICD-10-CM | POA: Diagnosis not present

## 2023-12-26 DIAGNOSIS — Z7189 Other specified counseling: Secondary | ICD-10-CM | POA: Diagnosis not present

## 2023-12-26 MED ORDER — LORAZEPAM 2 MG/ML PO CONC
1.0000 mg | ORAL | 0 refills | Status: AC | PRN
Start: 2023-12-26 — End: ?

## 2023-12-26 MED ORDER — MORPHINE SULFATE (CONCENTRATE) 10 MG /0.5 ML PO SOLN: 10.0000 mg | ORAL | 0 refills | Status: AC | PRN

## 2023-12-26 NOTE — Plan of Care (Signed)
  Problem: Education: Goal: Knowledge of the prescribed therapeutic regimen will improve 12/26/2023 1637 by Karolee Ohs, RN Outcome: Adequate for Discharge 12/26/2023 1636 by Karolee Ohs, RN Outcome: Progressing   Problem: Coping: Goal: Ability to identify and develop effective coping behavior will improve Outcome: Adequate for Discharge   Problem: Clinical Measurements: Goal: Quality of life will improve Outcome: Adequate for Discharge   Problem: Role Relationship: Goal: Family's ability to cope with current situation will improve Outcome: Adequate for Discharge Goal: Ability to verbalize concerns, feelings, and thoughts to partner or family member will improve Outcome: Adequate for Discharge   Problem: Pain Management: Goal: Satisfaction with pain management regimen will improve Outcome: Adequate for Discharge   Problem: Education: Goal: Knowledge of General Education information will improve Description: Including pain rating scale, medication(s)/side effects and non-pharmacologic comfort measures Outcome: Adequate for Discharge   Problem: Health Behavior/Discharge Planning: Goal: Ability to manage health-related needs will improve Outcome: Adequate for Discharge   Problem: Clinical Measurements: Goal: Ability to maintain clinical measurements within normal limits will improve Outcome: Adequate for Discharge Goal: Will remain free from infection Outcome: Adequate for Discharge Goal: Diagnostic test results will improve Outcome: Adequate for Discharge Goal: Respiratory complications will improve Outcome: Adequate for Discharge Goal: Cardiovascular complication will be avoided Outcome: Adequate for Discharge   Problem: Activity: Goal: Risk for activity intolerance will decrease Outcome: Adequate for Discharge   Problem: Nutrition: Goal: Adequate nutrition will be maintained Outcome: Adequate for Discharge   Problem: Coping: Goal: Level of anxiety will  decrease Outcome: Adequate for Discharge   Problem: Elimination: Goal: Will not experience complications related to bowel motility Outcome: Adequate for Discharge Goal: Will not experience complications related to urinary retention Outcome: Adequate for Discharge   Problem: Pain Managment: Goal: General experience of comfort will improve and/or be controlled Outcome: Adequate for Discharge   Problem: Safety: Goal: Ability to remain free from injury will improve Outcome: Adequate for Discharge   Problem: Skin Integrity: Goal: Risk for impaired skin integrity will decrease Outcome: Adequate for Discharge

## 2023-12-26 NOTE — Plan of Care (Signed)
   Problem: Education: Goal: Knowledge of the prescribed therapeutic regimen will improve Outcome: Progressing

## 2023-12-26 NOTE — TOC Progression Note (Signed)
 Transition of Care [facility]) - Progression Note    Patient Details  Name: Christine Christensen MRN: [id number] Date of Birth: October 16, 1945  Transition of Care [facility]) CM/SW Contact  Mattix Imhof Aris [place], [place] Phone Number: 12/26/2023, 12:19 PM  Clinical Narrative:     CSW notified that pt's son has opted for comfort care and is interested in [facility]. CSW met with pt's son bedside and confirmed preference for [facility]. Csw explained referral process.   [facility] notified to proceed with referral. They will follow up with pt's son and review pt's eligibility for [facility]. TOC will continue to follow.            Social Determinants of Health (SDOH) Interventions SDOH Screenings   Food Insecurity: No Food Insecurity (11/01/2023)  Housing: Low Risk  (11/01/2023)  Transportation Needs: Patient Unable To Answer (12/23/2023)  Utilities: Not At Risk (11/01/2023)  Social Connections: Patient Unable To Answer (11/01/2023)  Tobacco Use: Medium Risk (12/23/2023)    Readmission Risk Interventions     No data to display

## 2023-12-26 NOTE — Progress Notes (Signed)
   12/26/23 1152  Spiritual Encounters  Type of Visit Follow up  Care provided to: Pt and family  Conversation partners present during encounter Nurse  Reason for visit Urgent spiritual support  OnCall Visit No   Follow up visit with patient and family. Patient will be moving to [facility]. Family requested compassionate presence and prayer.

## 2023-12-26 NOTE — TOC Transition Note (Signed)
 Transition of Care [facility]) - Discharge Note   Patient Details  Name: Christine Christensen MRN: [id number] Date of Birth: 1945/12/27  Transition of Care [facility]) CM/SW Contact:  Erin Sons, LCSW Phone Number: 12/26/2023, 3:20 PM   Clinical Narrative:       Per MD patient ready for DC to [facility]. RN, patient, patient's son, DSS Guardian, and facility notified of DC. Discharge Summary and FL2 sent to facility. RN to call report prior to discharge [phone]). DC packet on chart. Ambulance transport requested for patient.   CSW will sign off for now as social work intervention is no longer needed. Please consult [place] again if new needs arise.   Final next level of care: Skilled Nursing Facility Barriers to Discharge: No Barriers Identified   Patient Goals and CMS Choice            Discharge Placement              Patient chooses bed at:  [facility]) Patient to be transferred to facility by: [facility] Name of family member notified: [name]/DSS Guardian Patient and family notified of of transfer: 12/26/23  Discharge Plan and Services Additional resources added to the After Visit Summary for                                       Social Drivers of Health (SDOH) Interventions SDOH Screenings   Food Insecurity: No Food Insecurity (11/01/2023)  Housing: Low Risk  (11/01/2023)  Transportation Needs: Patient Unable To Answer (12/23/2023)  Utilities: Not At Risk (11/01/2023)  Social Connections: Patient Unable To Answer (11/01/2023)  Tobacco Use: Medium Risk (12/23/2023)     Readmission Risk Interventions     No data to display

## 2023-12-26 NOTE — Progress Notes (Signed)
 Daily Progress Note   Patient Name: Christine Christensen       Date: 12/26/2023 DOB: 1946-07-18  Age: 78 y.o. MRN#: [id number] Attending Physician: Rhetta Mura, MD Primary Care Physician: Fleet Contras, MD Admit Date: 12/22/2023 Length of Stay: 3 days  Reason for Consultation/Follow-up: Inpatient hospice referral, Non pain symptom management, Pain control, Psychosocial/spiritual support, and Terminal Care  HPI/Patient Profile:  78 y.o. female  with past medical history of  vascular dementia, chronic diastolic CHF, and prior CVA who presented from the nursing home for decreased level of consciousness. She was admitted on 12/22/2023 with NSTEMI (not candidate for intervention), CVA and transitioned to comfort care by neurology and hospitalist team after discussion with family and based on her wishes for not artificial prolonging of life or heroic measures.   PMT was consulted for comfort care.  Subjective:   Subjective: Chart Reviewed. Updates received. Patient Assessed. Created space and opportunity for patient  and family to explore thoughts and feelings regarding current medical situation.  Today's Discussion: Today prior to seeing the patient I did an extensive review of the chart for any significant changes in the past 24 hours.  The patient's vitals are stable, remains tachycardic in the 120s to 140s.  She has received 2 mg of morphine x 3 (10:30 AM yesterday, 1446 yesterday, 0105 yesterday).    Today saw the patient at the bedside, her son was present.  We spent some time discussing the clinical situation and his discussions back-and-forth with family.  Patient's cousin is unable to be here in person until 12 noon but he understands that there are other patients and told the cousin that he would call her.  We went into the patient's room and had a significant discussion with the patient's son with his cousin Gabriel Cirri on the phone.  We discussed multiple topics related to hospice including  symptom management, role of symptom management and specific medications used, concerns about "hastening death" with medications, as well as other multiple calming questions related hospice care.  I did a very extensive discussion the patient's son has consented to evaluation for inpatient hospice.  When assessed the patient is here comfortable, is tachycardic but not tachypneic or increased work of breathing like she was yesterday.  Appears more comfortable than she was.  I provided emotional and general support through therapeutic listening, empathy, sharing of stories, and other techniques. I answered all questions and addressed all concerns to the best of my ability.  After meeting with the patient's family, I discussed with the DSS legal guardian who is in agreement with the plan to transfer to inpatient/residential hospice.  I also engaged with the [facility] team and hospice liaison regarding referral to inpatient hospice and their preference of [facility].  Throughout the day there were multiple discussions and engagements over various methods discussing the logistics.  At the end of the day it appeared the patient would be transferring to residential hospice at [facility] today.  Review of Systems  Unable to perform ROS   Objective:   Vital Signs:  BP 115/80 (BP Location: Right Arm)   Pulse (!) 102   Temp 97.7 F (36.5 C)   Resp 17   SpO2 97%   Physical Exam Vitals and nursing note reviewed.  Constitutional:      General: She is sleeping. She is not in acute distress. HENT:     Head: Normocephalic and atraumatic.  Cardiovascular:     Rate and Rhythm: Tachycardia present. Rhythm irregular.  Pulmonary:     Effort: Pulmonary effort is normal. No respiratory distress.  Abdominal:     General: Abdomen is flat. There is no distension.  Skin:    General: Skin is warm and dry.  Neurological:     Mental Status: She is unresponsive.     Palliative Assessment/Data:  10%    Existing Vynca/ACP Documentation: None  Assessment & Plan:   Impression: Present on Admission:  NSTEMI (non-ST elevated myocardial infarction) [facility])  78 year old female with acute presentation chronic comorbidities as described above.  There is a bit of murkiness regarding her legal decision maker, this time she is unable to participate in conversations.  Decision makers either son (who indicated to the emergency room staff yesterday that he is HCPOA and subsequently made the patient DNR/comfort care) versus DSS, with multiple calls out to be assessed to try to ascertain the situation.  At this time the patient appears comfortable, no distress, no needed comfort orders.  DSS has responded to phone calls and have indicated they will not inject to DNR/comfort care as recommended by the medical team and agreed to by family.  Son expressed agreement with DNR/comfort care and is considering requesting inpatient hospice evaluation.  Today the patient's son/HCPOA in agreement with DSS legal guardian have agreed to transfer to residential hospice, bed is available today.  Anticipate transfer today.   SUMMARY OF RECOMMENDATIONS   DNR-comfort Continue comfort care while inpatient Anticipate discharge to [facility] residential hospice this afternoon Palliative medicine will follow while inpatient See symptom management orders below.  Symptom Management:  Zofran 4 mg IV every 6 hours as needed nausea Tylenol 650 mg PR every 6 hours as needed mild pain or fever greater than 111 Robinul 0.2 mg IV every 4 hours as needed cystoscopy secretions Haldol 0.5 mg IV every 4 hours as needed agitation or delirium Ativan 1 mg IV every 4 hours as needed anxiety ADDED Roxanol 10 mg sublingual every 2 hours as needed pain or distress (primary option) Morphine 1 to 4 mg IV every 15 minutes as needed severe pain or signs/symptoms of distress (secondary option for severe symptoms) Polyvinyl alcohol 1.4%  ophthalmic 1 drop OU 4 times daily as needed dry eyes  Code Status: DNR-comfort  Prognosis: < 2 weeks  Discharge Planning: Hospice facility  Discussed with: Patient's family, medical team, nursing team, [facility] team, hospice liaison, DSS legal guardian  Thank you for allowing [place] to participate in the care of Rozella I Peavler PMT will continue to support holistically.  Time Total: 96 min  Detailed review of medical records (labs, imaging, vital signs), medically appropriate exam, discussed with treatment team, counseling and education to patient, family, & staff, documenting clinical information, medication management, coordination of care  Wynne Dust, NP Palliative Medicine Team  Team Phone # [phone] (Nights/Weekends)  05/17/2021, 8:17 AM

## 2023-12-26 NOTE — Progress Notes (Signed)
 [facility] [phone] [facility] Liaison Note  Received request from [facility] manager for family interest in [facility]. Eligibility confirmed. Met with patient and family to confirm interest and explain services. Family and guardian agreeable to transfer today. TOC aware. RN please call report to [phone] prior to patient leaving the unit. Please send signed DNR with patient at discharge.   Thank you for allowing [place] to participate in this patient's care.  Henderson Newcomer, LPN [facility] Liaison [phone]

## 2023-12-26 NOTE — Discharge Summary (Signed)
 Physician Discharge Summary  Christine Christensen IRJ:[id number] DOB: 06-Aug-1946 DOA: 12/22/2023  PCP: Fleet Contras, MD  Admit date: 12/22/2023 Discharge date: 12/26/2023  Time spent: 36 minutes  Recommendations for Outpatient Follow-up:  Discharging to [facility] for end-of-life care  Discharge Diagnoses:  MAIN problem for hospitalization   CVA, NSTEMI  Please see below for itemized issues addressed in [facility]psital- refer to other progress notes for clarity if needed  Discharge Condition: Guarded  Diet recommendation: Comfort  There were no vitals filed for this visit.  History of present illness:  78 year old black female known CAD prior stroke with right hemiparesis HFpEF with severe mitral stenosis wheelchair-bound at baseline prior ESBL UTIs Recent admission 3/19 3/23 with metabolic encephalopathy secondary to ESBL UTI  Brought in from facility again on 4/5 with poor responsiveness decreased LOC more drowsy than usual MRI revealed acute CVA on the right and 1 on the left Patient had right large left MCA stroke-patient did rule in for probable NSTEMI-with troponins in the 1500s she was ruled in as being DNR Multiple discussions were carried out with the care team and patient's family palliative care was consulted Decision was made to consider hospice Patient continued to decline and family elected on [facility] hospice and patient will discharge there with comfort related meds Her prognosis is guarded less than 2 weeks   Discharge Exam: Vitals:   12/25/23 0511 12/26/23 0422  BP: (!) 123/98 115/80  Pulse:    Resp: 17 17  Temp: 98.9 F (37.2 C) 97.7 F (36.5 C)  SpO2: 98% 97%    Subj on day of d/c   Sleeping minimally responsive son at bedside  General Exam on discharge  EOMI NCAT frail cachectic black female does not appear to be in pain or distress Chest is clear no wheeze rales rhonchi S1-S2 no murmur Abdomen soft no rebound no guarding ROM limited in  withdrawals    Discharge Instructions   Discharge Instructions     Diet - low sodium heart healthy   Complete by: As directed    Increase activity slowly   Complete by: As directed       Allergies as of 12/26/2023       Reactions   Penicillins Other (See Comments)   Unknown reaction        Medication List     STOP taking these medications    acetaminophen 500 MG tablet Commonly known as: TYLENOL   aspirin 325 MG tablet   busPIRone 5 MG tablet Commonly known as: BUSPAR   divalproex 125 MG capsule Commonly known as: DEPAKOTE SPRINKLE   donepezil 5 MG tablet Commonly known as: ARICEPT   escitalopram 20 MG tablet Commonly known as: LEXAPRO   LACTOBACILLUS PO   melatonin 3 MG Tabs tablet   memantine 10 MG tablet Commonly known as: NAMENDA   multivitamin with minerals Tabs tablet   senna-docusate 8.6-50 MG tablet Commonly known as: Senokot-S   spironolactone 25 MG tablet Commonly known as: ALDACTONE   torsemide 20 MG tablet Commonly known as: DEMADEX       TAKE these medications    albuterol 108 (90 Base) MCG/ACT inhaler Commonly known as: VENTOLIN HFA Inhale 2 puffs into the lungs every 4 (four) hours as needed for wheezing or shortness of breath.   LORazepam 2 MG/ML concentrated solution Commonly known as: ATIVAN Place 0.5 mLs (1 mg total) under the tongue every 4 (four) hours as needed for anxiety.   morphine CONCENTRATE 10 mg /  0.5 ml concentrated solution Take 0.5 mLs (10 mg total) by mouth every 2 (two) hours as needed (pain).       Allergies  Allergen Reactions   Penicillins Other (See Comments)    Unknown reaction      The results of significant diagnostics from this hospitalization (including imaging, microbiology, ancillary and laboratory) are listed below for reference.    Significant Diagnostic Studies: MR BRAIN WO CONTRAST Result Date: 12/22/2023 CLINICAL DATA:  Altered mental status EXAM: MRI HEAD WITHOUT CONTRAST  TECHNIQUE: Multiplanar, multiecho pulse sequences of the brain and surrounding structures were obtained without intravenous contrast. COMPARISON:  10/12/2023 FINDINGS: Brain: There is multifocal acute/early subacute ischemia within the right MCA territory, predominantly affecting the insula and right temporal lobe. There is also a small area of hyperintensity on diffusion-weighted imaging within the left parietal white matter. Chronic microhemorrhages within the right thalamus. There is multifocal hyperintense T2-weighted signal within the white matter. Generalized volume loss. There are old pontine small vessel infarcts. Old right cerebellar small vessel infarcts. Vascular: Diminished right ICA flow void. Known occlusion of the right MCA M1 segment. Skull and upper cervical spine: Normal calvarium and skull base. Visualized upper cervical spine and soft tissues are normal. Sinuses/Orbits:No paranasal sinus fluid levels or advanced mucosal thickening. No mastoid or middle ear effusion. Normal orbits. IMPRESSION: 1. Multifocal acute/early subacute ischemia within the right MCA territory, predominantly affecting the insula and right temporal lobe. 2. Small area of subacute ischemia within the left parietal white matter. 3. Known occlusion of the right MCA M1 segment. 4. Diminished right ICA flow void. Electronically Signed   By: Deatra Robinson M.D.   On: 12/22/2023 23:39   CT ANGIO HEAD NECK W WO CM Result Date: 12/22/2023 CLINICAL DATA:  Acute neurologic deficit EXAM: CT ANGIOGRAPHY HEAD AND NECK WITH AND WITHOUT CONTRAST TECHNIQUE: Multidetector CT imaging of the head and neck was performed using the standard protocol during bolus administration of intravenous contrast. Multiplanar CT image reconstructions and MIPs were obtained to evaluate the vascular anatomy. Carotid stenosis measurements (when applicable) are obtained utilizing NASCET criteria, using the distal internal carotid diameter as the denominator.  RADIATION DOSE REDUCTION: This exam was performed according to the departmental dose-optimization program which includes automated exposure control, adjustment of the mA and/or kV according to patient size and/or use of iterative reconstruction technique. CONTRAST:  75mL OMNIPAQUE IOHEXOL 350 MG/ML SOLN COMPARISON:  CTA head neck 12/20/2017 FINDINGS: CT HEAD FINDINGS Brain: There is no mass, hemorrhage or extra-axial collection. The size and configuration of the ventricles and extra-axial CSF spaces are normal. There is hypoattenuation of the white matter, most commonly indicating chronic small vessel disease. Old right parietal infarct. Vascular: Atherosclerotic calcification of the internal carotid arteries at the skull base. No abnormal hyperdensity of the major intracranial arteries or dural venous sinuses. Skull: The visualized skull base, calvarium and extracranial soft tissues are normal. Sinuses/Orbits: No fluid levels or advanced mucosal thickening of the visualized paranasal sinuses. No mastoid or middle ear effusion. Normal orbits. CTA NECK FINDINGS Skeleton: No acute abnormality or high grade bony spinal canal stenosis. Other neck: Heterogeneous and enlarged thyroid gland. Upper chest: No pneumothorax or pleural effusion. No nodules or masses. Aortic arch: There is calcific atherosclerosis of the aortic arch. Conventional 3 vessel aortic branching pattern. RIGHT carotid system: No dissection, occlusion or aneurysm. Mild atherosclerotic calcification at the carotid bifurcation without hemodynamically significant stenosis. LEFT carotid system: No dissection, occlusion or aneurysm. There is mixed density atherosclerosis extending  into the proximal ICA, resulting in 50% stenosis. Vertebral arteries: Right dominant configuration. There is no dissection, occlusion or flow-limiting stenosis to the skull base (V1-V3 segments). CTA HEAD FINDINGS POSTERIOR CIRCULATION: Vertebral arteries are normal. No proximal  occlusion of the anterior or inferior cerebellar arteries. Basilar artery is patent. Superior cerebellar arteries are normal. Posterior cerebral arteries are normal. ANTERIOR CIRCULATION: Atherosclerotic calcification of the internal carotid arteries at the skull base without hemodynamically significant stenosis. Anterior cerebral arteries are normal. Right MCA M1 segment is occluded over approximately a 1.0 cm segment. The distal branches fill normally. Normal left MCA. Dural venous sinuses are patent. Anatomic Variants: None Review of the MIP images confirms the above findings. IMPRESSION: 1. Occlusion of the right MCA M1 segment. Occluded length measures proximally 1.0 cm. Distal branches fill normally. 2. Old right parietal infarct and findings of chronic small vessel disease. 3. A 50% stenosis of the proximal left ICA. Critical Value/emergent results were called by telephone at the time of interpretation on 12/22/2023 at 11:13 pm to provider Dr. Jarold Motto, who verbally acknowledged these results. Electronically Signed   By: Deatra Robinson M.D.   On: 12/22/2023 23:15   DG Chest Port 1 View Result Date: 12/22/2023 CLINICAL DATA:  Altered mental status EXAM: PORTABLE CHEST 1 VIEW COMPARISON:  12/05/2023 FINDINGS: Stable pulmonary insufflation. Interval development of retrocardiac infiltrate, possibly infectious in the acute setting. No pneumothorax or pleural effusion. Cardiac size within normal limits. No acute bone abnormality. IMPRESSION: 1. Retrocardiac infiltrate, possibly infectious in the acute setting. Electronically Signed   By: Helyn Numbers M.D.   On: 12/22/2023 20:49   CT Head Wo Contrast Result Date: 12/05/2023 CLINICAL DATA:  Mental status change, unknown cause EXAM: CT HEAD WITHOUT CONTRAST TECHNIQUE: Contiguous axial images were obtained from the base of the skull through the vertex without intravenous contrast. RADIATION DOSE REDUCTION: This exam was performed according to the departmental  dose-optimization program which includes automated exposure control, adjustment of the mA and/or kV according to patient size and/or use of iterative reconstruction technique. COMPARISON:  MRI and CT head 10/12/2023 FINDINGS: Brain: No intracranial hemorrhage, mass effect, or evidence of acute infarct. No hydrocephalus. No extra-axial fluid collection. Generalized cerebral atrophy and chronic small vessel ischemic disease. Chronic cerebellar and basal ganglia lacunar infarcts. Vascular: No hyperdense vessel. Intracranial arterial calcification. Skull: No fracture or focal lesion. Sinuses/Orbits: No acute finding. Other: None. IMPRESSION: No acute intracranial abnormality. Electronically Signed   By: Minerva Fester M.D.   On: 12/05/2023 23:40   DG Chest Portable 1 View Result Date: 12/05/2023 CLINICAL DATA:  Altered mental status. EXAM: PORTABLE CHEST 1 VIEW COMPARISON:  November 05, 2023 FINDINGS: The heart size and mediastinal contours are within normal limits. There is moderate severity calcification of the aortic arch. There is no evidence of an acute infiltrate, pleural effusion or pneumothorax. Radiopaque surgical clips are seen overlying the right upper quadrant. Multilevel degenerative changes are noted throughout the thoracic spine. IMPRESSION: No active cardiopulmonary disease. Electronically Signed   By: Aram Candela M.D.   On: 12/05/2023 22:51    Microbiology: Recent Results (from the past 240 hours)  Blood Culture (Routine X 2)     Status: None (Preliminary result)   Collection Time: 12/22/23  8:49 PM   Specimen: BLOOD LEFT ARM  Result Value Ref Range Status   Specimen Description BLOOD LEFT ARM  Final   Special Requests   Final    BOTTLES DRAWN AEROBIC AND ANAEROBIC Blood Culture results may  not be optimal due to an inadequate volume of blood received in culture bottles   Culture   Final    NO GROWTH 4 DAYS Performed at [facility], [address] [place], [place]  [address]    Report Status PENDING  Incomplete  Blood Culture (Routine X 2)     Status: None (Preliminary result)   Collection Time: 12/22/23  8:58 PM   Specimen: BLOOD RIGHT ARM  Result Value Ref Range Status   Specimen Description BLOOD RIGHT ARM  Final   Special Requests   Final    BOTTLES DRAWN AEROBIC AND ANAEROBIC Blood Culture results may not be optimal due to an inadequate volume of blood received in culture bottles   Culture   Final    NO GROWTH 4 DAYS Performed at [facility], [address] [place], [place] [address]    Report Status PENDING  Incomplete  Resp panel by RT-PCR (RSV, Flu A&B, Covid) Anterior Nasal Swab     Status: None   Collection Time: 12/22/23  9:00 PM   Specimen: Anterior Nasal Swab  Result Value Ref Range Status   SARS Coronavirus 2 by RT PCR NEGATIVE NEGATIVE Final   Influenza A by PCR NEGATIVE NEGATIVE Final   Influenza B by PCR NEGATIVE NEGATIVE Final    Comment: (NOTE) The Xpert Xpress SARS-CoV-2/FLU/RSV plus assay is intended as an aid in the diagnosis of influenza from Nasopharyngeal swab specimens and should not be used as a sole basis for treatment. Nasal washings and aspirates are unacceptable for Xpert Xpress SARS-CoV-2/FLU/RSV testing.  Fact Sheet for Patients: BloggerCourse.com  Fact Sheet for Healthcare Providers: SeriousBroker.it  This test is not yet approved or cleared by the [place] FDA and has been authorized for detection and/or diagnosis of SARS-CoV-2 by FDA under an Emergency Use Authorization (EUA). This EUA will remain in effect (meaning this test can be used) for the duration of the COVID-19 declaration under Section 564(b)(1) of the Act, 21 U.S.C. section 360bbb-3(b)(1), unless the authorization is terminated or revoked.     Resp Syncytial Virus by PCR NEGATIVE NEGATIVE Final    Comment: (NOTE) Fact Sheet for  Patients: BloggerCourse.com  Fact Sheet for Healthcare Providers: SeriousBroker.it  This test is not yet approved or cleared by the [place] FDA and has been authorized for detection and/or diagnosis of SARS-CoV-2 by FDA under an Emergency Use Authorization (EUA). This EUA will remain in effect (meaning this test can be used) for the duration of the COVID-19 declaration under Section 564(b)(1) of the Act, 21 U.S.C. section 360bbb-3(b)(1), unless the authorization is terminated or revoked.  Performed at [facility], [address] [place], [place] [address]      Labs: Basic Metabolic Panel: Recent Labs  Lab 12/22/23 2019 12/22/23 2033 12/22/23 2035 12/22/23 2037  NA 139 140  --  139  K 3.5 3.5  --  3.5  CL 98  --   --  97*  CO2  --   --   --  29  GLUCOSE 117*  --   --  115*  BUN 33*  --   --  34*  CREATININE 1.70*  --  1.70* 1.55*  CALCIUM  --   --   --  8.9   Liver Function Tests: Recent Labs  Lab 12/22/23 2037  AST 21  ALT 14  ALKPHOS 66  BILITOT 0.6  PROT 7.5  ALBUMIN 2.9*   No results for input(s): "  LIPASE", "AMYLASE" in the last 168 hours. Recent Labs  Lab 12/22/23 2040  AMMONIA 41*   CBC: Recent Labs  Lab 12/22/23 2019 12/22/23 2033 12/22/23 2045  WBC  --   --  14.2*  NEUTROABS  --   --  11.1*  HGB 11.9* 11.9* 11.5*  HCT 35.0* 35.0* 34.6*  MCV  --   --  88.5  PLT  --   --  253   Cardiac Enzymes: No results for input(s): "CKTOTAL", "CKMB", "CKMBINDEX", "TROPONINI" in the last 168 hours. BNP: BNP (last 3 results) No results for input(s): "BNP" in the last 8760 hours.  ProBNP (last 3 results) No results for input(s): "PROBNP" in the last 8760 hours.  CBG: No results for input(s): "GLUCAP" in the last 168 hours.  Signed:  Rhetta Mura MD   [facility] 12/26/2023, 2:44 PM

## 2023-12-26 NOTE — Progress Notes (Signed)
 [facility] transferred patient to [facility].

## 2023-12-27 LAB — CULTURE, BLOOD (ROUTINE X 2)
Culture: NO GROWTH
Culture: NO GROWTH

## 2024-01-17 DEATH — deceased
